# Patient Record
Sex: Female | Born: 1935 | Race: White | Hispanic: No | Smoking: Former smoker
Health system: Southern US, Community
[De-identification: ages and names within clinical notes are randomized; demographics above are authoritative.]

## PROBLEM LIST (undated history)

## (undated) DIAGNOSIS — C50919 Malignant neoplasm of unspecified site of unspecified female breast: Secondary | ICD-10-CM

## (undated) DIAGNOSIS — S72141A Displaced intertrochanteric fracture of right femur, initial encounter for closed fracture: Secondary | ICD-10-CM

## (undated) DIAGNOSIS — I10 Essential (primary) hypertension: Secondary | ICD-10-CM

## (undated) DIAGNOSIS — I519 Heart disease, unspecified: Secondary | ICD-10-CM

## (undated) DIAGNOSIS — H905 Unspecified sensorineural hearing loss: Secondary | ICD-10-CM

## (undated) DIAGNOSIS — M48061 Spinal stenosis, lumbar region without neurogenic claudication: Secondary | ICD-10-CM

## (undated) DIAGNOSIS — E559 Vitamin D deficiency, unspecified: Secondary | ICD-10-CM

## (undated) DIAGNOSIS — E039 Hypothyroidism, unspecified: Secondary | ICD-10-CM

## (undated) DIAGNOSIS — N390 Urinary tract infection, site not specified: Secondary | ICD-10-CM

## (undated) DIAGNOSIS — N183 Chronic kidney disease, stage 3 unspecified: Secondary | ICD-10-CM

## (undated) DIAGNOSIS — Z87891 Personal history of nicotine dependence: Secondary | ICD-10-CM

## (undated) DIAGNOSIS — H3530 Unspecified macular degeneration: Secondary | ICD-10-CM

## (undated) DIAGNOSIS — E785 Hyperlipidemia, unspecified: Secondary | ICD-10-CM

## (undated) DIAGNOSIS — N2581 Secondary hyperparathyroidism of renal origin: Secondary | ICD-10-CM

## (undated) DIAGNOSIS — E871 Hypo-osmolality and hyponatremia: Secondary | ICD-10-CM

## (undated) HISTORY — DX: Urinary tract infection, site not specified: N39.0

## (undated) HISTORY — DX: Secondary hyperparathyroidism of renal origin: N25.81

## (undated) HISTORY — DX: Hypothyroidism, unspecified: E03.9

## (undated) HISTORY — DX: Displaced intertrochanteric fracture of right femur, initial encounter for closed fracture: S72.141A

## (undated) HISTORY — DX: Hyperlipidemia, unspecified: E78.5

## (undated) HISTORY — DX: Malignant neoplasm of unspecified site of unspecified female breast: C50.919

## (undated) HISTORY — DX: Chronic kidney disease, stage 3 (moderate): N18.3

## (undated) HISTORY — DX: Chronic kidney disease, stage 3 unspecified: N18.30

## (undated) HISTORY — PX: REPLACEMENT TOTAL KNEE BILATERAL: SUR1225

## (undated) HISTORY — DX: Heart disease, unspecified: I51.9

## (undated) HISTORY — PX: ABDOMINAL HYSTERECTOMY: SHX81

## (undated) HISTORY — DX: Essential (primary) hypertension: I10

## (undated) HISTORY — PX: OTHER SURGICAL HISTORY: SHX169

## (undated) HISTORY — DX: Hypo-osmolality and hyponatremia: E87.1

## (undated) HISTORY — DX: Spinal stenosis, lumbar region without neurogenic claudication: M48.061

## (undated) HISTORY — DX: Vitamin D deficiency, unspecified: E55.9

## (undated) HISTORY — PX: JOINT REPLACEMENT: SHX530

## (undated) HISTORY — DX: Unspecified macular degeneration: H35.30

## (undated) DEATH — deceased

---

## 1978-12-04 HISTORY — PX: CHOLECYSTECTOMY: SHX55

## 2003-01-14 DIAGNOSIS — M1710 Unilateral primary osteoarthritis, unspecified knee: Secondary | ICD-10-CM | POA: Insufficient documentation

## 2004-12-04 DIAGNOSIS — M48061 Spinal stenosis, lumbar region without neurogenic claudication: Secondary | ICD-10-CM

## 2004-12-04 HISTORY — PX: SPINE SURGERY: SHX786

## 2004-12-04 HISTORY — DX: Spinal stenosis, lumbar region without neurogenic claudication: M48.061

## 2008-06-16 ENCOUNTER — Ambulatory Visit: Payer: Self-pay | Admitting: Internal Medicine

## 2008-12-04 HISTORY — PX: SMALL INTESTINE SURGERY: SHX150

## 2009-01-06 ENCOUNTER — Ambulatory Visit: Payer: Self-pay | Admitting: Internal Medicine

## 2009-03-17 LAB — HM DEXA SCAN: HM Dexa Scan: NORMAL

## 2009-03-17 LAB — HM MAMMOGRAPHY: HM Mammogram: NORMAL

## 2009-05-16 ENCOUNTER — Inpatient Hospital Stay: Payer: Self-pay | Admitting: Surgery

## 2009-05-18 LAB — HM SIGMOIDOSCOPY

## 2009-06-04 LAB — CBC AND DIFFERENTIAL
HCT: 35 % — AB (ref 36–46)
Hemoglobin: 11.7 g/dL — AB (ref 12.0–16.0)
Neutrophils Absolute: 48 /uL
Platelets: 401 K/µL — AB (ref 150–399)
WBC: 6.1 10*3/mL

## 2009-06-04 LAB — HEPATIC FUNCTION PANEL
ALT: 17 U/L (ref 7–35)
AST: 26 U/L (ref 13–35)
Alkaline Phosphatase: 58 U/L (ref 25–125)
Bilirubin, Total: 0.8 mg/dL

## 2009-06-04 LAB — BASIC METABOLIC PANEL WITH GFR
BUN: 24 mg/dL — AB (ref 4–21)
Creatinine: 1.2 mg/dL — AB (ref 0.5–1.1)
Glucose: 128 mg/dL
Potassium: 4.8 mmol/L (ref 3.4–5.3)

## 2009-06-23 ENCOUNTER — Inpatient Hospital Stay: Payer: Self-pay | Admitting: Surgery

## 2009-08-03 ENCOUNTER — Ambulatory Visit: Payer: Self-pay | Admitting: Internal Medicine

## 2009-12-04 HISTORY — PX: LAPAROSCOPIC SIGMOID COLECTOMY: SHX5928

## 2011-03-21 DIAGNOSIS — H268 Other specified cataract: Secondary | ICD-10-CM | POA: Insufficient documentation

## 2011-03-21 DIAGNOSIS — H3530 Unspecified macular degeneration: Secondary | ICD-10-CM | POA: Insufficient documentation

## 2011-03-21 DIAGNOSIS — H35369 Drusen (degenerative) of macula, unspecified eye: Secondary | ICD-10-CM | POA: Insufficient documentation

## 2011-07-25 ENCOUNTER — Telehealth: Payer: Self-pay | Admitting: Internal Medicine

## 2011-07-25 MED ORDER — ENALAPRIL MALEATE 20 MG PO TABS: 20.0000 mg | ORAL_TABLET | Freq: Every day | ORAL | Status: DC

## 2011-07-25 MED ORDER — LEVOTHYROXINE SODIUM 50 MCG PO TABS: 50.0000 ug | ORAL_TABLET | Freq: Every day | ORAL | Status: DC

## 2011-07-25 NOTE — Telephone Encounter (Signed)
Patient requested Levoxyl 50 mcg and Enalapril 20 mg.  I have faxed both Rxs into [employer].

## 2011-08-14 ENCOUNTER — Other Ambulatory Visit: Payer: Self-pay | Admitting: *Deleted

## 2011-08-14 NOTE — Telephone Encounter (Signed)
Opened in error. Medication already refilled.

## 2011-12-06 ENCOUNTER — Telehealth: Payer: Self-pay | Admitting: Internal Medicine

## 2011-12-06 NOTE — Telephone Encounter (Signed)
Pt called stating she needed a lab order send to [facility] @ [facility] so she can get her labs done before her appointment on 12/15/11 Pt didn't have fax number

## 2011-12-06 NOTE — Telephone Encounter (Signed)
On your printer

## 2011-12-07 NOTE — Telephone Encounter (Signed)
Letter faxed to [facility] at [phone].

## 2011-12-11 ENCOUNTER — Telehealth: Payer: Self-pay | Admitting: Internal Medicine

## 2011-12-11 DIAGNOSIS — E039 Hypothyroidism, unspecified: Secondary | ICD-10-CM | POA: Diagnosis not present

## 2011-12-11 DIAGNOSIS — E785 Hyperlipidemia, unspecified: Secondary | ICD-10-CM | POA: Diagnosis not present

## 2011-12-11 DIAGNOSIS — Z79899 Other long term (current) drug therapy: Secondary | ICD-10-CM | POA: Diagnosis not present

## 2011-12-15 ENCOUNTER — Ambulatory Visit (INDEPENDENT_AMBULATORY_CARE_PROVIDER_SITE_OTHER): Payer: Medicare Other | Admitting: Internal Medicine

## 2011-12-15 ENCOUNTER — Encounter: Payer: Self-pay | Admitting: Internal Medicine

## 2011-12-15 VITALS — BP 120/64 | HR 76 | Temp 97.9°F | Wt 142.0 lb

## 2011-12-15 DIAGNOSIS — M48061 Spinal stenosis, lumbar region without neurogenic claudication: Secondary | ICD-10-CM | POA: Diagnosis not present

## 2011-12-15 DIAGNOSIS — I10 Essential (primary) hypertension: Secondary | ICD-10-CM | POA: Diagnosis not present

## 2011-12-15 DIAGNOSIS — E039 Hypothyroidism, unspecified: Secondary | ICD-10-CM | POA: Diagnosis not present

## 2011-12-15 DIAGNOSIS — E871 Hypo-osmolality and hyponatremia: Secondary | ICD-10-CM | POA: Diagnosis not present

## 2011-12-15 NOTE — Progress Notes (Signed)
  Subjective:    Patient ID: Pamela Paul, female    DOB: Nov 06, 1926, 78 y.o.   MRN: [id number]  HPI  Ms. Zobrist is an  78 yo white female with a history of prior lumbar surgery who presents for 6 month follow up on hypertension and hypothyroidism.   Her chief complaint is  with occasional dead/heavy feeling in both legs occurring without  pain , just occasional loss of power in legs.  She has no falls and denies a feeling of numbness.   She occasionally notes transient pain in the anterior shin of her right legs which aches for a few hours before resolving spontaneously .   She was also recently treated with avelox empirically by [facility] her husband's ENT for incident complaint of dysuria. Symptoms are resolved.   Past Medical History  Diagnosis Date  . Spinal stenosis of lumbar region     s/p surgery  . Hypothyroidism   . Hypertension     Current Outpatient Prescriptions on File Prior to Visit  Medication Sig Dispense Refill  . enalapril (VASOTEC) 20 MG tablet Take 1 tablet (20 mg total) by mouth daily.  90 tablet  3  . levothyroxine (SYNTHROID, LEVOTHROID) 50 MCG tablet Take 1 tablet (50 mcg total) by mouth daily.  90 tablet  3     Review of Systems  Constitutional: Negative for fever, chills, appetite change, fatigue and unexpected weight change.  HENT: Negative for ear pain, congestion, sore throat, trouble swallowing, neck pain, voice change and sinus pressure.   Eyes: Negative for visual disturbance.  Respiratory: Negative for cough, shortness of breath, wheezing and stridor.   Cardiovascular: Negative for chest pain, palpitations and leg swelling.  Gastrointestinal: Negative for nausea, vomiting, abdominal pain, diarrhea, constipation, blood in stool, abdominal distention and anal bleeding.  Genitourinary: Negative for dysuria and flank pain.  Musculoskeletal: Negative for myalgias, arthralgias and gait problem.  Skin: Negative for color change and rash.  Neurological:  Positive for weakness. Negative for dizziness and headaches.  Hematological: Negative for adenopathy. Does not bruise/bleed easily.  Psychiatric/Behavioral: Negative for suicidal ideas, sleep disturbance and dysphoric mood. The patient is not nervous/anxious.   BP 120/64  Pulse 76  Temp(Src) 97.9 F (36.6 C) (Oral)  Wt 142 lb (64.411 kg)  SpO2 98%      Objective:   Physical Exam  Constitutional: She is oriented to person, place, and time. She appears well-developed and well-nourished.  HENT:  Mouth/Throat: Oropharynx is clear and moist.  Eyes: EOM are normal. Pupils are equal, round, and reactive to light. No scleral icterus.  Neck: Normal range of motion. Neck supple. No JVD present. No thyromegaly present.  Cardiovascular: Normal rate, regular rhythm, normal heart sounds and intact distal pulses.   Pulmonary/Chest: Effort normal and breath sounds normal.  Abdominal: Soft. Bowel sounds are normal. She exhibits no mass. There is no tenderness.  Genitourinary: Uterus normal.  Musculoskeletal: Normal range of motion. She exhibits no edema.  Lymphadenopathy:    She has no cervical adenopathy.  Neurological: She is alert and oriented to person, place, and time.  Skin: Skin is warm and dry.  Psychiatric: She has a normal mood and affect.       Assessment & Plan:    Updated Medication List

## 2011-12-15 NOTE — Patient Instructions (Signed)
Please ask Leafy Ro to draw a BMET  In on e month to assess resolution of  hyponatremia  (276.8) nonfasting

## 2011-12-17 ENCOUNTER — Encounter: Payer: Self-pay | Admitting: Internal Medicine

## 2011-12-17 DIAGNOSIS — I10 Essential (primary) hypertension: Secondary | ICD-10-CM | POA: Insufficient documentation

## 2011-12-17 DIAGNOSIS — E039 Hypothyroidism, unspecified: Secondary | ICD-10-CM | POA: Insufficient documentation

## 2011-12-17 DIAGNOSIS — E871 Hypo-osmolality and hyponatremia: Secondary | ICD-10-CM | POA: Insufficient documentation

## 2011-12-17 DIAGNOSIS — M48061 Spinal stenosis, lumbar region without neurogenic claudication: Secondary | ICD-10-CM | POA: Insufficient documentation

## 2011-12-17 NOTE — Assessment & Plan Note (Signed)
Well controlled on current regimen. Renal function stable, no changes today. 

## 2011-12-17 NOTE — Assessment & Plan Note (Signed)
tsh was 2.7 recently .  continue current Synthroid dose.

## 2011-12-17 NOTE — Assessment & Plan Note (Signed)
With recent development of intermittent symptps suggestng neurogenic claudication.  DTRs are normal, as is her strength and feeling today.  Will follow for now.

## 2011-12-17 NOTE — Assessment & Plan Note (Addendum)
Mild, with elevated Cr of 1.4 ,  May be due to mild dehydration, not taking a diuretic.  Will repeat in na few weeks in a nonfasting state.

## 2011-12-22 NOTE — Telephone Encounter (Signed)
Error

## 2011-12-29 DIAGNOSIS — M4310 Spondylolisthesis, site unspecified: Secondary | ICD-10-CM | POA: Diagnosis not present

## 2012-01-02 DIAGNOSIS — H35369 Drusen (degenerative) of macula, unspecified eye: Secondary | ICD-10-CM | POA: Diagnosis not present

## 2012-01-02 DIAGNOSIS — H3530 Unspecified macular degeneration: Secondary | ICD-10-CM | POA: Diagnosis not present

## 2012-01-02 DIAGNOSIS — H35729 Serous detachment of retinal pigment epithelium, unspecified eye: Secondary | ICD-10-CM | POA: Diagnosis not present

## 2012-01-03 DIAGNOSIS — M6281 Muscle weakness (generalized): Secondary | ICD-10-CM | POA: Diagnosis not present

## 2012-01-03 DIAGNOSIS — R262 Difficulty in walking, not elsewhere classified: Secondary | ICD-10-CM | POA: Diagnosis not present

## 2012-01-05 DIAGNOSIS — R262 Difficulty in walking, not elsewhere classified: Secondary | ICD-10-CM | POA: Diagnosis not present

## 2012-01-05 DIAGNOSIS — M6281 Muscle weakness (generalized): Secondary | ICD-10-CM | POA: Diagnosis not present

## 2012-01-08 DIAGNOSIS — R262 Difficulty in walking, not elsewhere classified: Secondary | ICD-10-CM | POA: Diagnosis not present

## 2012-01-08 DIAGNOSIS — M6281 Muscle weakness (generalized): Secondary | ICD-10-CM | POA: Diagnosis not present

## 2012-01-10 DIAGNOSIS — M6281 Muscle weakness (generalized): Secondary | ICD-10-CM | POA: Diagnosis not present

## 2012-01-10 DIAGNOSIS — R262 Difficulty in walking, not elsewhere classified: Secondary | ICD-10-CM | POA: Diagnosis not present

## 2012-01-12 DIAGNOSIS — R262 Difficulty in walking, not elsewhere classified: Secondary | ICD-10-CM | POA: Diagnosis not present

## 2012-01-12 DIAGNOSIS — M6281 Muscle weakness (generalized): Secondary | ICD-10-CM | POA: Diagnosis not present

## 2012-01-15 DIAGNOSIS — R262 Difficulty in walking, not elsewhere classified: Secondary | ICD-10-CM | POA: Diagnosis not present

## 2012-01-15 DIAGNOSIS — M6281 Muscle weakness (generalized): Secondary | ICD-10-CM | POA: Diagnosis not present

## 2012-01-16 DIAGNOSIS — M6281 Muscle weakness (generalized): Secondary | ICD-10-CM | POA: Diagnosis not present

## 2012-01-16 DIAGNOSIS — R262 Difficulty in walking, not elsewhere classified: Secondary | ICD-10-CM | POA: Diagnosis not present

## 2012-01-18 DIAGNOSIS — E876 Hypokalemia: Secondary | ICD-10-CM | POA: Diagnosis not present

## 2012-01-19 DIAGNOSIS — M6281 Muscle weakness (generalized): Secondary | ICD-10-CM | POA: Diagnosis not present

## 2012-01-19 DIAGNOSIS — R262 Difficulty in walking, not elsewhere classified: Secondary | ICD-10-CM | POA: Diagnosis not present

## 2012-01-22 DIAGNOSIS — M6281 Muscle weakness (generalized): Secondary | ICD-10-CM | POA: Diagnosis not present

## 2012-01-22 DIAGNOSIS — R262 Difficulty in walking, not elsewhere classified: Secondary | ICD-10-CM | POA: Diagnosis not present

## 2012-01-23 DIAGNOSIS — R262 Difficulty in walking, not elsewhere classified: Secondary | ICD-10-CM | POA: Diagnosis not present

## 2012-01-23 DIAGNOSIS — M6281 Muscle weakness (generalized): Secondary | ICD-10-CM | POA: Diagnosis not present

## 2012-01-26 DIAGNOSIS — M6281 Muscle weakness (generalized): Secondary | ICD-10-CM | POA: Diagnosis not present

## 2012-01-26 DIAGNOSIS — R262 Difficulty in walking, not elsewhere classified: Secondary | ICD-10-CM | POA: Diagnosis not present

## 2012-01-29 ENCOUNTER — Ambulatory Visit (INDEPENDENT_AMBULATORY_CARE_PROVIDER_SITE_OTHER): Payer: Medicare Other | Admitting: Internal Medicine

## 2012-01-29 ENCOUNTER — Encounter: Payer: Self-pay | Admitting: Internal Medicine

## 2012-01-29 DIAGNOSIS — J329 Chronic sinusitis, unspecified: Secondary | ICD-10-CM | POA: Diagnosis not present

## 2012-01-29 MED ORDER — BENZONATATE 200 MG PO CAPS: 200.0000 mg | ORAL_CAPSULE | Freq: Three times a day (TID) | ORAL | Status: AC | PRN

## 2012-01-29 MED ORDER — AZITHROMYCIN 500 MG PO TABS: 500.0000 mg | ORAL_TABLET | Freq: Every day | ORAL | Status: DC

## 2012-01-29 NOTE — Patient Instructions (Signed)
You have an upper respiratory infection that appears to be viral.  It will run its course in 7 to 10 days .   Use Delsym OTC for cough.  I will give you a stronger cough medicine to use if the delsym is not strong enough for the cough.   Use Simply saline twice daily to flush your sinuses , use for the rest of the winter.      I will  prescribe an antibiotic that you can start on Wednesday or Thursday if you have not made any improvement  By then

## 2012-01-29 NOTE — Assessment & Plan Note (Signed)
Given her age and symptoms suggestive of viral URI, will treat symptomatically and add  empiric antibiotics, if decongestants, and saline lavage do not resolve symptoms.

## 2012-01-29 NOTE — Progress Notes (Signed)
Subjective:    Patient ID: Pamela Paul, female    DOB: Mar 17, 1926, 78 y.o.   MRN: [id number]  HPI  78 yr old white female presents with cough which started  3 days ago,  Nose running started today,  No fevers or myalgias .  Cough is mildly productive.  Leaving for [place] on Satruday and wants to be well enough to travel.   Past Medical History  Diagnosis Date  . Spinal stenosis of lumbar region     s/p surgery  . Hypothyroidism   . Hypertension    Current Outpatient Prescriptions on File Prior to Visit  Medication Sig Dispense Refill  . aspirin 81 MG tablet Take 160 mg by mouth daily.      . Calcium-Vitamin D (CALTRATE 600 PLUS-VIT D PO) Take by mouth daily      . enalapril (VASOTEC) 20 MG tablet Take 1 tablet (20 mg total) by mouth daily.  90 tablet  3  . levothyroxine (SYNTHROID, LEVOTHROID) 50 MCG tablet Take 1 tablet (50 mcg total) by mouth daily.  90 tablet  3    Review of Systems  Constitutional: Negative for fever, chills and unexpected weight change.  HENT: Positive for postnasal drip. Negative for hearing loss, ear pain, nosebleeds, congestion, sore throat, facial swelling, rhinorrhea, sneezing, mouth sores, trouble swallowing, neck pain, neck stiffness, voice change, sinus pressure, tinnitus and ear discharge.   Eyes: Negative for pain, discharge, redness and visual disturbance.  Respiratory: Positive for cough and wheezing. Negative for chest tightness, shortness of breath and stridor.   Cardiovascular: Negative for chest pain, palpitations and leg swelling.  Musculoskeletal: Negative for myalgias and arthralgias.  Skin: Negative for color change and rash.  Neurological: Negative for dizziness, weakness, light-headedness and headaches.  Hematological: Negative for adenopathy.       Objective:   Physical Exam  Constitutional: She is oriented to person, place, and time. She appears well-developed and well-nourished.  HENT:  Mouth/Throat: Oropharynx is clear and  moist.  Eyes: EOM are normal. Pupils are equal, round, and reactive to light. No scleral icterus.  Neck: Normal range of motion. Neck supple. No JVD present. No thyromegaly present.  Cardiovascular: Normal rate, regular rhythm, normal heart sounds and intact distal pulses.   Pulmonary/Chest: Effort normal and breath sounds normal.  Abdominal: Soft. Bowel sounds are normal. She exhibits no mass. There is no tenderness.  Musculoskeletal: Normal range of motion. She exhibits no edema.  Lymphadenopathy:    She has no cervical adenopathy.  Neurological: She is alert and oriented to person, place, and time.  Skin: Skin is warm and dry.  Psychiatric: She has a normal mood and affect.      Assessment & Plan:   Sinusitis Given her age and symptoms suggestive of viral URI, will treat symptomatically and add  empiric antibiotics, if decongestants, and saline lavage do not resolve symptoms.     Updated Medication List Outpatient Encounter Prescriptions as of 01/29/2012  Medication Sig Dispense Refill  . aspirin 81 MG tablet Take 160 mg by mouth daily.      . Calcium-Vitamin D (CALTRATE 600 PLUS-VIT D PO) Take by mouth daily      . enalapril (VASOTEC) 20 MG tablet Take 1 tablet (20 mg total) by mouth daily.  90 tablet  3  . levothyroxine (SYNTHROID, LEVOTHROID) 50 MCG tablet Take 1 tablet (50 mcg total) by mouth daily.  90 tablet  3  . Multiple Vitamins-Minerals (PRESERVISION AREDS PO) Take by mouth.      Marland Kitchen  azithromycin (ZITHROMAX) 500 MG tablet Take 1 tablet (500 mg total) by mouth daily.  6 tablet  0  . benzonatate (TESSALON) 200 MG capsule Take 1 capsule (200 mg total) by mouth 3 (three) times daily as needed for cough.  60 capsule  0

## 2012-02-06 ENCOUNTER — Encounter: Payer: Self-pay | Admitting: Internal Medicine

## 2012-02-14 ENCOUNTER — Encounter: Payer: Self-pay | Admitting: Internal Medicine

## 2012-02-14 DIAGNOSIS — M4310 Spondylolisthesis, site unspecified: Secondary | ICD-10-CM | POA: Diagnosis not present

## 2012-02-14 DIAGNOSIS — Z981 Arthrodesis status: Secondary | ICD-10-CM | POA: Diagnosis not present

## 2012-02-14 DIAGNOSIS — I519 Heart disease, unspecified: Secondary | ICD-10-CM | POA: Insufficient documentation

## 2012-02-14 DIAGNOSIS — E785 Hyperlipidemia, unspecified: Secondary | ICD-10-CM | POA: Insufficient documentation

## 2012-02-14 DIAGNOSIS — Q762 Congenital spondylolisthesis: Secondary | ICD-10-CM | POA: Diagnosis not present

## 2012-03-15 ENCOUNTER — Ambulatory Visit (INDEPENDENT_AMBULATORY_CARE_PROVIDER_SITE_OTHER): Payer: Medicare Other | Admitting: Internal Medicine

## 2012-03-15 ENCOUNTER — Encounter: Payer: Self-pay | Admitting: Internal Medicine

## 2012-03-15 VITALS — BP 126/60 | HR 70 | Temp 97.9°F | Resp 14 | Ht 65.5 in | Wt 184.8 lb

## 2012-03-15 DIAGNOSIS — Z0000 Encounter for general adult medical examination without abnormal findings: Secondary | ICD-10-CM

## 2012-03-15 DIAGNOSIS — Z1239 Encounter for other screening for malignant neoplasm of breast: Secondary | ICD-10-CM | POA: Diagnosis not present

## 2012-03-15 DIAGNOSIS — Z1211 Encounter for screening for malignant neoplasm of colon: Secondary | ICD-10-CM | POA: Insufficient documentation

## 2012-03-15 NOTE — Assessment & Plan Note (Addendum)
She was admitted with SBO at [facility]  In June 2010, had a flexible sigmoidoscopy at that time which was negative for masses,  Only diverticulosis. She does not want to continue screening except annual fecal occult  blood tests.

## 2012-03-17 DIAGNOSIS — Z1239 Encounter for other screening for malignant neoplasm of breast: Secondary | ICD-10-CM | POA: Insufficient documentation

## 2012-03-17 NOTE — Progress Notes (Signed)
Patient ID: Pamela Paul, female   DOB: June 19, 1926, 78 y.o.   MRN: [id number] The patient is here for annual Medicare wellness examination and management of other chronic and acute problems.   The risk factors are reflected in the social history.  The roster of all physicians providing medical care to patient - is listed in the Snapshot section of the chart.  Activities of daily living:  The patient is 100% independent in all ADLs: dressing, toileting, feeding as well as independent mobility  Home safety : The patient has smoke detectors in the home. They wear seatbelts.  There are no firearms at home. There is no violence in the home.   There is no risks for hepatitis, STDs or HIV. There is no   history of blood transfusion. They have no travel history to infectious disease endemic areas of the world.  The patient has seen their dentist in the last six month. They have seen their eye doctor in the last year. They admit to slight hearing difficulty with regard to whispered voices and some television programs.  They have deferred audiologic testing in the last year.  They do not  have excessive sun exposure. Discussed the need for sun protection: hats, long sleeves and use of sunscreen if there is significant sun exposure.   Diet: the importance of a healthy diet is discussed. They do have a healthy diet.  The benefits of regular aerobic exercise were discussed. She walks and swims 4 times per week ,  20 minutes.   Depression screen: there are no signs or vegative symptoms of depression- irritability, change in appetite, anhedonia, sadness/tearfullness.  Cognitive assessment: the patient manages all their financial and personal affairs and is actively engaged. They could relate day,date,year and events; recalled 1/3 objects at 3 minutes; performed clock-face test normally.  The following portions of the patient's history were reviewed and updated as appropriate: allergies, current medications,  past family history, past medical history,  past surgical history, past social history  and problem list.  Visual acuity was not assessed per patient preference since she has regular follow up with her ophthalmologist. Hearing and body mass index were assessed and reviewed.   During the course of the visit the patient was educated and counseled about appropriate screening and preventive services including : fall prevention , diabetes screening, nutrition counseling, colorectal cancer screening, and recommended immunizations.   Past Medical History  Diagnosis Date  . Spinal stenosis of lumbar region     s/p surgery  . Hypertension   . Hyperlipidemia   . Hypothyroidism   . Heart disease     mitral valvular   Past Surgical History  Procedure Date  . Tendon repair     achille heall, left   . Spine surgery 2006    rods, [facility]  . Colectomy. Partial left 2010    for SBO  . Cholecystectomy 1980  . Joint replacement     Bilateral knee  . Abdominal hysterectomy   . Bilateral cataract surgery   . Replacement total knee bilateral     BP 126/60  Pulse 70  Temp(Src) 97.9 F (36.6 C) (Oral)  Resp 14  Ht 5' 5.5" (1.664 m)  Wt 184 lb 12 oz (83.802 kg)  BMI 30.28 kg/m2  SpO2 95%  General appearance: alert, cooperative and appears stated age Ears: normal TM's and external ear canals both ears Throat: lips, mucosa, and tongue normal; teeth and gums normal Neck: no adenopathy, no carotid bruit, supple,  symmetrical, trachea midline and thyroid not enlarged, symmetric, no tenderness/mass/nodules Back: symmetric, no curvature. ROM normal. No CVA tenderness. Lungs: clear to auscultation bilaterally Heart: regular rate and rhythm, S1, S2 normal, no murmur, click, rub or gallop Abdomen: soft, non-tender; bowel sounds normal; no masses,  no organomegaly Pulses: 2+ and symmetric Skin: Skin color, texture, turgor normal. No rashes or lesions Lymph nodes: Cervical, supraclavicular, and axillary  nodes normal.

## 2012-03-17 NOTE — Assessment & Plan Note (Signed)
After discussing the the benefits and risks with continued screening for breast cancer she has decided to resume annual mammograms.

## 2012-03-26 DIAGNOSIS — Z961 Presence of intraocular lens: Secondary | ICD-10-CM | POA: Diagnosis not present

## 2012-03-26 DIAGNOSIS — H3530 Unspecified macular degeneration: Secondary | ICD-10-CM | POA: Diagnosis not present

## 2012-04-17 ENCOUNTER — Telehealth: Payer: Self-pay | Admitting: Internal Medicine

## 2012-04-17 ENCOUNTER — Encounter: Payer: Self-pay | Admitting: Internal Medicine

## 2012-04-17 ENCOUNTER — Ambulatory Visit (INDEPENDENT_AMBULATORY_CARE_PROVIDER_SITE_OTHER): Payer: Medicare Other | Admitting: Internal Medicine

## 2012-04-17 VITALS — BP 123/72 | HR 73 | Temp 98.1°F | Resp 16 | Wt 188.5 lb

## 2012-04-17 DIAGNOSIS — J069 Acute upper respiratory infection, unspecified: Secondary | ICD-10-CM

## 2012-04-17 DIAGNOSIS — R05 Cough: Secondary | ICD-10-CM

## 2012-04-17 DIAGNOSIS — R059 Cough, unspecified: Secondary | ICD-10-CM | POA: Diagnosis not present

## 2012-04-17 MED ORDER — BENZONATATE 200 MG PO CAPS: 200.0000 mg | ORAL_CAPSULE | Freq: Two times a day (BID) | ORAL | Status: AC | PRN

## 2012-04-17 MED ORDER — AZITHROMYCIN 250 MG PO TABS: ORAL_TABLET | ORAL | Status: AC

## 2012-04-17 NOTE — Telephone Encounter (Signed)
Caller: Sanyah/Patient; PCP: Deborra Medina; CB#: [phone];  Call regarding Cough/Congestion; sx started 04/12/12; no fever; non productive cough; cough is the worst sx; Triaged per Cough Guideline; See in 24 hr d/t recurrent  episodes of uncontrolled coughing and interfering with daily activities; appt made for today at 4:15pm Dr Gilford Rile; will comply

## 2012-04-17 NOTE — Assessment & Plan Note (Signed)
Symptoms and exam are most consistent with viral upper respiratory infection with cough. Encouraged her to continue rest and increase fluid intake. She will use Tylenol as needed for sore throat. She will use Tessalon for cough. If her symptoms persist over the next 48 hours or she develops fever, chills, worsening cough, then she will start antibiotics with azithromycin. If her symptoms are worsening or not improving, she will call our office with an update.

## 2012-04-17 NOTE — Progress Notes (Signed)
  Subjective:    Patient ID: Pamela Paul, female    DOB: [birth year], 78 y.o.   MRN: [id number]  HPI 78 year old female with history of hypertension and hypothyroidism presents with a four-day history of fatigue, sore throat, and nonproductive cough. She denies fever or chills. She denies chest pain. She denies any known sick contacts. She has not been taking any medication for her symptoms. She has tried increasing her fluid intake and sipping on hot tea with some improvement in her sore throat.  Outpatient Prescriptions Prior to Visit  Medication Sig Dispense Refill  . aspirin 81 MG tablet Take 160 mg by mouth daily.      . Calcium-Vitamin D (CALTRATE 600 PLUS-VIT D PO) Take by mouth daily      . enalapril (VASOTEC) 20 MG tablet Take 1 tablet (20 mg total) by mouth daily.  90 tablet  3  . levothyroxine (SYNTHROID, LEVOTHROID) 50 MCG tablet Take 1 tablet (50 mcg total) by mouth daily.  90 tablet  3  . Multiple Vitamins-Minerals (PRESERVISION AREDS PO) Take by mouth.        Review of Systems  Constitutional: Positive for fatigue. Negative for fever, chills and unexpected weight change.  HENT: Positive for congestion, sore throat and rhinorrhea. Negative for hearing loss, ear pain, nosebleeds, facial swelling, sneezing, mouth sores, trouble swallowing, neck pain, neck stiffness, voice change, postnasal drip, sinus pressure, tinnitus and ear discharge.   Eyes: Negative for pain, discharge, redness and visual disturbance.  Respiratory: Positive for cough. Negative for chest tightness, shortness of breath, wheezing and stridor.   Cardiovascular: Negative for chest pain, palpitations and leg swelling.  Musculoskeletal: Negative for myalgias and arthralgias.  Skin: Negative for color change and rash.  Neurological: Negative for dizziness, weakness, light-headedness and headaches.  Hematological: Negative for adenopathy.   BP 123/72  Pulse 73  Temp(Src) 98.1 F (36.7 C) (Oral)  Resp 16  Wt  188 lb 8 oz (85.503 kg)  SpO2 96%     Objective:   Physical Exam  Constitutional: She is oriented to person, place, and time. She appears well-developed and well-nourished. No distress.  HENT:  Head: Normocephalic and atraumatic.  Right Ear: External ear normal.  Left Ear: External ear normal.  Nose: Nose normal.  Mouth/Throat: Posterior oropharyngeal erythema present. No oropharyngeal exudate.  Eyes: Conjunctivae are normal. Pupils are equal, round, and reactive to light. Right eye exhibits no discharge. Left eye exhibits no discharge. No scleral icterus.  Neck: Normal range of motion. Neck supple. No tracheal deviation present. No thyromegaly present.  Cardiovascular: Normal rate, regular rhythm, normal heart sounds and intact distal pulses.  Exam reveals no gallop and no friction rub.   No murmur heard. Pulmonary/Chest: Effort normal and breath sounds normal. No respiratory distress. She has no wheezes. She has no rales. She exhibits no tenderness.  Musculoskeletal: Normal range of motion. She exhibits no edema and no tenderness.  Lymphadenopathy:    She has no cervical adenopathy.  Neurological: She is alert and oriented to person, place, and time. No cranial nerve deficit. She exhibits normal muscle tone. Coordination normal.  Skin: Skin is warm and dry. No rash noted. She is not diaphoretic. No erythema. No pallor.  Psychiatric: She has a normal mood and affect. Her behavior is normal. Judgment and thought content normal.          Assessment & Plan:

## 2012-05-22 DIAGNOSIS — L609 Nail disorder, unspecified: Secondary | ICD-10-CM | POA: Diagnosis not present

## 2012-05-22 DIAGNOSIS — L570 Actinic keratosis: Secondary | ICD-10-CM | POA: Diagnosis not present

## 2012-05-24 ENCOUNTER — Ambulatory Visit: Payer: Self-pay | Admitting: Internal Medicine

## 2012-05-24 DIAGNOSIS — Z1231 Encounter for screening mammogram for malignant neoplasm of breast: Secondary | ICD-10-CM | POA: Diagnosis not present

## 2012-06-03 ENCOUNTER — Encounter: Payer: Self-pay | Admitting: Internal Medicine

## 2012-06-16 LAB — HM MAMMOGRAPHY: HM Mammogram: NORMAL

## 2012-07-16 DIAGNOSIS — H353190 Nonexudative age-related macular degeneration, unspecified eye, stage unspecified: Secondary | ICD-10-CM | POA: Diagnosis not present

## 2012-07-16 DIAGNOSIS — H35369 Drusen (degenerative) of macula, unspecified eye: Secondary | ICD-10-CM | POA: Diagnosis not present

## 2012-07-16 DIAGNOSIS — H43819 Vitreous degeneration, unspecified eye: Secondary | ICD-10-CM | POA: Diagnosis not present

## 2012-07-16 DIAGNOSIS — Z961 Presence of intraocular lens: Secondary | ICD-10-CM | POA: Diagnosis not present

## 2012-07-19 ENCOUNTER — Telehealth: Payer: Self-pay | Admitting: Internal Medicine

## 2012-07-19 MED ORDER — ENALAPRIL MALEATE 20 MG PO TABS: 20.0000 mg | ORAL_TABLET | Freq: Every day | ORAL | Status: DC

## 2012-07-19 MED ORDER — LEVOTHYROXINE SODIUM 50 MCG PO TABS: 50.0000 ug | ORAL_TABLET | Freq: Every day | ORAL | Status: DC

## 2012-07-19 NOTE — Telephone Encounter (Signed)
Refill on enalapril 20 mg and levothyroxine 50 mcg.

## 2012-09-09 ENCOUNTER — Encounter: Payer: Self-pay | Admitting: Internal Medicine

## 2012-10-01 DIAGNOSIS — Z23 Encounter for immunization: Secondary | ICD-10-CM | POA: Diagnosis not present

## 2013-01-07 DIAGNOSIS — H3530 Unspecified macular degeneration: Secondary | ICD-10-CM | POA: Diagnosis not present

## 2013-01-07 DIAGNOSIS — Z961 Presence of intraocular lens: Secondary | ICD-10-CM | POA: Diagnosis not present

## 2013-01-07 DIAGNOSIS — H43819 Vitreous degeneration, unspecified eye: Secondary | ICD-10-CM | POA: Diagnosis not present

## 2013-01-18 ENCOUNTER — Other Ambulatory Visit: Payer: Self-pay

## 2013-02-20 ENCOUNTER — Telehealth: Payer: Self-pay | Admitting: General Practice

## 2013-02-20 NOTE — Telephone Encounter (Signed)
Can we please fax orders for pt to [facility] the nurse at [facility] before she comes in for her appt on 4/14. Pt wanting her bloodwork drawn before she comes in to see you.

## 2013-02-21 NOTE — Telephone Encounter (Signed)
Placed in the folder to be faxed.

## 2013-02-21 NOTE — Telephone Encounter (Signed)
should be on printer

## 2013-02-27 DIAGNOSIS — R5381 Other malaise: Secondary | ICD-10-CM | POA: Diagnosis not present

## 2013-02-27 DIAGNOSIS — E559 Vitamin D deficiency, unspecified: Secondary | ICD-10-CM | POA: Diagnosis not present

## 2013-02-27 DIAGNOSIS — E785 Hyperlipidemia, unspecified: Secondary | ICD-10-CM | POA: Diagnosis not present

## 2013-02-27 DIAGNOSIS — Z79899 Other long term (current) drug therapy: Secondary | ICD-10-CM | POA: Diagnosis not present

## 2013-03-03 ENCOUNTER — Telehealth: Payer: Self-pay | Admitting: Internal Medicine

## 2013-03-03 NOTE — Telephone Encounter (Signed)
Electrolytes are fine. She is Mildly anemic, and her kidney function is a little bit off. Cholesterol is fine except for triglycerides being elevated at 212. Vitamin D and thyroid are both fine. Before she comes from her apartment April 14 I would like to get a repeat kidney function tests and she is not fasting and a couple of iron studies on her anemia. I  Have drafted  a letter to the [facility] nurse that you can print and sent.

## 2013-03-03 NOTE — Telephone Encounter (Signed)
Pt.notified

## 2013-03-10 DIAGNOSIS — R5383 Other fatigue: Secondary | ICD-10-CM | POA: Diagnosis not present

## 2013-03-10 DIAGNOSIS — E559 Vitamin D deficiency, unspecified: Secondary | ICD-10-CM | POA: Diagnosis not present

## 2013-03-10 DIAGNOSIS — E785 Hyperlipidemia, unspecified: Secondary | ICD-10-CM | POA: Diagnosis not present

## 2013-03-10 DIAGNOSIS — R5381 Other malaise: Secondary | ICD-10-CM | POA: Diagnosis not present

## 2013-03-10 DIAGNOSIS — Z79899 Other long term (current) drug therapy: Secondary | ICD-10-CM | POA: Diagnosis not present

## 2013-03-12 ENCOUNTER — Telehealth: Payer: Self-pay | Admitting: Internal Medicine

## 2013-03-17 ENCOUNTER — Encounter: Payer: Self-pay | Admitting: Internal Medicine

## 2013-03-17 ENCOUNTER — Ambulatory Visit (INDEPENDENT_AMBULATORY_CARE_PROVIDER_SITE_OTHER): Payer: Medicare Other | Admitting: Internal Medicine

## 2013-03-17 VITALS — BP 138/72 | HR 63 | Temp 97.6°F | Resp 15 | Wt 189.2 lb

## 2013-03-17 DIAGNOSIS — Z1211 Encounter for screening for malignant neoplasm of colon: Secondary | ICD-10-CM

## 2013-03-17 DIAGNOSIS — Z0000 Encounter for general adult medical examination without abnormal findings: Secondary | ICD-10-CM | POA: Insufficient documentation

## 2013-03-17 DIAGNOSIS — Z1239 Encounter for other screening for malignant neoplasm of breast: Secondary | ICD-10-CM

## 2013-03-17 DIAGNOSIS — I10 Essential (primary) hypertension: Secondary | ICD-10-CM | POA: Diagnosis not present

## 2013-03-17 DIAGNOSIS — Z85038 Personal history of other malignant neoplasm of large intestine: Secondary | ICD-10-CM

## 2013-03-17 LAB — FECAL OCCULT BLOOD, GUAIAC: Fecal Occult Blood: NEGATIVE

## 2013-03-17 NOTE — Assessment & Plan Note (Addendum)
Pelvic, breast and rectal exam today .  fobt done.  Labs were done at [facility] prior to visit.,  BMEt was repeated nofasting bc of mild change in GFR

## 2013-03-17 NOTE — Progress Notes (Signed)
Patient ID: Pamela Paul, female   DOB: [birth year], 79 y.o.   MRN: [id number]  The patient is here for annual Medicare wellness examination and management of other chronic and acute problems.   The risk factors are reflected in the social history.  The roster of all physicians providing medical care to patient - is listed in the Snapshot section of the chart.  Activities of daily living:  The patient is 100% independent in all ADLs: dressing, toileting, feeding as well as independent mobility  Home safety : The patient has smoke detectors in the home. They wear seatbelts.  There are no firearms at home. There is no violence in the home.   There is no risks for hepatitis, STDs or HIV. There is no   history of blood transfusion. They have no travel history to infectious disease endemic areas of the world.  The patient has seen their dentist in the last six month. They have seen their eye doctor in the last year. They admit to slight hearing difficulty with regard to whispered voices and some television programs.  They have deferred audiologic testing in the last year.  They do not  have excessive sun exposure. Discussed the need for sun protection: hats, long sleeves and use of sunscreen if there is significant sun exposure.   Diet: the importance of a healthy diet is discussed. They do have a healthy diet.  The benefits of regular aerobic exercise were discussed. She walks 4 times per week ,  20 minutes.   Depression screen: there are no signs or vegative symptoms of depression- irritability, change in appetite, anhedonia, sadness/tearfullness.  Cognitive assessment: the patient manages all their financial and personal affairs and is actively engaged. They could relate day,date,year and events; recalled 2/3 objects at 3 minutes; performed clock-face test normally.  The following portions of the patient's history were reviewed and updated as appropriate: allergies, current medications, past  family history, past medical history,  past surgical history, past social history  and problem list.  Visual acuity was not assessed per patient preference since she has regular follow up with her ophthalmologist. Hearing and body mass index were assessed and reviewed.   During the course of the visit the patient was educated and counseled about appropriate screening and preventive services including : fall prevention , diabetes screening, nutrition counseling, colorectal cancer screening, and recommended immunizations.    Objective  BP 138/72  Pulse 63  Temp(Src) 97.6 F (36.4 C) (Oral)  Resp 15  Wt 189 lb 4 oz (85.843 kg)  BMI 31 kg/m2  SpO2 97%  General Appearance:    Alert, cooperative, no distress, appears stated age  Head:    Normocephalic, without obvious abnormality, atraumatic  Eyes:    PERRL, conjunctiva/corneas clear, EOM's intact, fundi    benign, both eyes  Ears:    Normal TM's and external ear canals, both ears  Nose:   Nares normal, septum midline, mucosa normal, no drainage    or sinus tenderness  Throat:   Lips, mucosa, and tongue normal; teeth and gums normal  Neck:   Supple, symmetrical, trachea midline, no adenopathy;    thyroid:  no enlargement/tenderness/nodules; no carotid   bruit or JVD  Back:     Symmetric, no curvature, ROM normal, no CVA tenderness  Lungs:     Clear to auscultation bilaterally, respirations unlabored  Chest Wall:    No tenderness or deformity   Heart:    Regular rate and rhythm, S1 and S2  normal, no murmur, rub   or gallop  Breast Exam:    No tenderness, masses, or nipple abnormality  Abdomen:     Soft, non-tender, bowel sounds active all four quadrants,    no masses, no organomegaly  Genitalia:    Normal female without lesion, discharge or tenderness  Rectal:    Normal tone,  no masses or tenderness;   guaiac negative stool  Extremities:   Extremities normal, atraumatic, no cyanosis or edema  Pulses:   2+ and symmetric all  extremities  Skin:   Skin color, texture, turgor normal, no rashes or lesions  Lymph nodes:   Cervical, supraclavicular, and axillary nodes normal  Neurologic:   CNII-XII intact, normal strength, sensation and reflexes    throughout    Assessment and Plan  Routine general medical examination at a health care facility Pelvic, breast and rectal exam today .  fobt was negative  Labs were done at [facility] prior to visit.,  BMET was repeated in a nonfasting state to confirm stability of GFR   Screening for colon cancer Due in June for follow up colonsocopy with [facility]    Breast screening, unspecified She has decided to stop screening in one year. Mammogram ordered.   Hypertension Well controlled on current regimen. Renal function stable, no changes today.   Updated Medication List Outpatient Encounter Prescriptions as of 03/17/2013  Medication Sig Dispense Refill  . aspirin 81 MG tablet Take 160 mg by mouth daily.      . Calcium-Vitamin D (CALTRATE 600 PLUS-VIT D PO) Take by mouth daily      . enalapril (VASOTEC) 20 MG tablet Take 1 tablet (20 mg total) by mouth daily.  90 tablet  3  . levothyroxine (SYNTHROID, LEVOTHROID) 50 MCG tablet Take 1 tablet (50 mcg total) by mouth daily.  90 tablet  3  . Multiple Vitamins-Minerals (PRESERVISION AREDS PO) Take by mouth.       No facility-administered encounter medications on file as of 03/17/2013.

## 2013-03-17 NOTE — Assessment & Plan Note (Signed)
She has decided to stop screening in one year. Mammogram ordered.

## 2013-03-17 NOTE — Assessment & Plan Note (Signed)
Due in June for follow y\up colonsocopy with wilton [employer]

## 2013-03-17 NOTE — Patient Instructions (Addendum)
Your labs were normal  We will have Dr Rochel Brome call yo uto set up your colonoscopy

## 2013-03-17 NOTE — Assessment & Plan Note (Signed)
Well controlled on current regimen. Renal function stable, no changes today. 

## 2013-04-03 ENCOUNTER — Encounter: Payer: Self-pay | Admitting: Internal Medicine

## 2013-04-25 ENCOUNTER — Other Ambulatory Visit: Payer: Self-pay | Admitting: *Deleted

## 2013-04-25 ENCOUNTER — Telehealth: Payer: Self-pay | Admitting: *Deleted

## 2013-04-25 MED ORDER — LEVOTHYROXINE SODIUM 50 MCG PO TABS: 50.0000 ug | ORAL_TABLET | Freq: Every day | ORAL | Status: DC

## 2013-04-25 NOTE — Telephone Encounter (Signed)
Must have your verbal okay to switch patient synthroid to current store brand they are using called [facility].

## 2013-04-25 NOTE — Telephone Encounter (Signed)
Rx sent to pharmacy by escript  

## 2013-04-30 DIAGNOSIS — Z1211 Encounter for screening for malignant neoplasm of colon: Secondary | ICD-10-CM | POA: Diagnosis not present

## 2013-04-30 LAB — HM COLONOSCOPY

## 2013-05-19 ENCOUNTER — Telehealth: Payer: Self-pay | Admitting: Internal Medicine

## 2013-05-19 NOTE — Telephone Encounter (Signed)
Patient states she has a UTI and is wanting to be seen. We do not have any available appointments. Please advise.

## 2013-05-19 NOTE — Telephone Encounter (Signed)
Patient has appointment for tomorrow at 9 am

## 2013-05-20 ENCOUNTER — Ambulatory Visit (INDEPENDENT_AMBULATORY_CARE_PROVIDER_SITE_OTHER): Payer: Medicare Other | Admitting: Adult Health

## 2013-05-20 ENCOUNTER — Encounter: Payer: Self-pay | Admitting: Adult Health

## 2013-05-20 VITALS — BP 130/68 | HR 80 | Temp 97.9°F | Resp 14 | Wt 188.0 lb

## 2013-05-20 DIAGNOSIS — R350 Frequency of micturition: Secondary | ICD-10-CM

## 2013-05-20 DIAGNOSIS — R300 Dysuria: Secondary | ICD-10-CM

## 2013-05-20 LAB — POCT URINALYSIS DIPSTICK
Bilirubin, UA: NEGATIVE
Glucose, UA: NEGATIVE
Ketones, UA: NEGATIVE
Nitrite, UA: POSITIVE
Protein, UA: >=300
Spec Grav, UA: 1.02
Urobilinogen, UA: 0.2
pH, UA: 6.5

## 2013-05-20 MED ORDER — CIPROFLOXACIN HCL 250 MG PO TABS: 250.0000 mg | ORAL_TABLET | Freq: Two times a day (BID) | ORAL | Status: DC

## 2013-05-20 NOTE — Patient Instructions (Addendum)
  I am treating you for a urinary tract infection.  Start Cipro 250 mg twice a day for 3 days.  You can also take AZO which is sold over the counter. This helps with your urinary discomfort.   Below is information on UTIs:  What is the urinary tract? - The urinary tract is the group of organs in the body that handle urine. The urinary tract includes the:   Kidneys, two bean-shaped organs that filter the blood to make urine  Bladder, a balloon-shaped organ that stores urine  Ureters, two tubes that carry urine from the kidneys to the bladder  Urethra, the tube that carries urine from the bladder to the outside of the body   What are urinary tract infections? - Urinary tract infections, also called "UTIs," are infections that affect either the bladder or the kidneys. Bladder infections are more common than kidney infections. Bladder infections happen when bacteria get into the urethra and travel up into the bladder. Kidney infections happen when the bacteria travel even higher, up into the kidneys. Both bladder and kidney infections are more common in women than men.   What are the symptoms of a bladder infection? - The symptoms include:   Pain or a burning feeling when you urinate  The need to urinate often  The need to urinate suddenly or in a hurry  Blood in the urine   What are the symptoms of a kidney infection? - The symptoms of a kidney infection can include the symptoms of a bladder infection, but kidney infections can also cause:   Fever  Back pain  Nausea or vomiting   How are urinary tract infections treated? - Most urinary tract infections are treated with antibiotic pills. These pills work by killing the germs that cause the infection.  If you have a bladder infection, you will probably need to take antibiotics for 3 to 7 days. If you have a kidney infection, you will probably need to take antibiotics for longer-maybe for up to 2 weeks. If you have a kidney infection,  it's also possible you will need to be treated in the hospital.   Your symptoms should begin to improve within a day of starting antibiotics. But you should finish all the antibiotic pills you get. Otherwise your infection might come back.  If needed, you can also take a medicine to numb your bladder such as AZO or Urostat which are sold over the counter.   Can cranberry juice or other cranberry products prevent bladder infections? - The studies suggesting that cranberry products prevent bladder infections are not very good. But if you want to try cranberry products for this purpose, there is probably not much harm in doing so.

## 2013-05-20 NOTE — Progress Notes (Signed)
  Subjective:    Patient ID: Pamela Paul, female    DOB: 1926-10-08, 79 y.o.   MRN: [id number]  HPI  Pt presents with dysuria, urgency, frequency. Symptoms began on Sunday. She has not taken any OTC products for her symptoms.   Current Outpatient Prescriptions on File Prior to Visit  Medication Sig Dispense Refill  . aspirin 81 MG tablet Take 160 mg by mouth daily.      . Calcium-Vitamin D (CALTRATE 600 PLUS-VIT D PO) Take by mouth daily      . enalapril (VASOTEC) 20 MG tablet Take 1 tablet (20 mg total) by mouth daily.  90 tablet  3  . levothyroxine (SYNTHROID, LEVOTHROID) 50 MCG tablet Take 1 tablet (50 mcg total) by mouth daily.  90 tablet  3  . Multiple Vitamins-Minerals (PRESERVISION AREDS PO) Take by mouth.       No current facility-administered medications on file prior to visit.     Review of Systems  Constitutional: Positive for fever and chills.  Genitourinary: Positive for dysuria, urgency, frequency and flank pain. Negative for hematuria.       Urine cloudy with odor    BP 130/68  Pulse 80  Temp(Src) 97.9 F (36.6 C) (Oral)  Resp 14  Wt 188 lb (85.276 kg)  BMI 30.8 kg/m2  SpO2 96%    Objective:   Physical Exam  Constitutional: She is oriented to person, place, and time. She appears well-developed and well-nourished.  Cardiovascular: Normal rate.   Pulmonary/Chest: Effort normal. No respiratory distress.  Genitourinary:  Suprapubic tenderness  Neurological: She is alert and oriented to person, place, and time.  Skin: Skin is warm and dry.  Psychiatric: She has a normal mood and affect. Her behavior is normal. Judgment and thought content normal.       Assessment & Plan:

## 2013-05-20 NOTE — Assessment & Plan Note (Signed)
Start Cipro 250 mg twice a day x3 days. May take AZO for urinary discomfort. Send for culture.

## 2013-05-22 DIAGNOSIS — L821 Other seborrheic keratosis: Secondary | ICD-10-CM | POA: Diagnosis not present

## 2013-05-22 DIAGNOSIS — L570 Actinic keratosis: Secondary | ICD-10-CM | POA: Diagnosis not present

## 2013-05-22 DIAGNOSIS — D485 Neoplasm of uncertain behavior of skin: Secondary | ICD-10-CM | POA: Diagnosis not present

## 2013-05-23 ENCOUNTER — Telehealth: Payer: Self-pay | Admitting: *Deleted

## 2013-05-23 LAB — URINE CULTURE: Colony Count: >=100000

## 2013-05-23 MED ORDER — SULFAMETHOXAZOLE-TRIMETHOPRIM 800-160 MG PO TABS: 1.0000 | ORAL_TABLET | Freq: Two times a day (BID) | ORAL | Status: DC

## 2013-05-23 NOTE — Telephone Encounter (Signed)
Pt presented to office, took last Cipro this morning and urinary symptoms have not improved. Notified pt of urine culture. Charolette Forward, NP advised to change to Bactrim 160/800 mg bid x 3 days. Rx sent to [facility] via escript. Pt verbalized understanding. Advised to call back with failure of improvement.

## 2013-06-11 ENCOUNTER — Ambulatory Visit: Payer: Self-pay | Admitting: Gastroenterology

## 2013-06-11 DIAGNOSIS — E039 Hypothyroidism, unspecified: Secondary | ICD-10-CM | POA: Diagnosis not present

## 2013-06-11 DIAGNOSIS — K5730 Diverticulosis of large intestine without perforation or abscess without bleeding: Secondary | ICD-10-CM | POA: Diagnosis not present

## 2013-06-11 DIAGNOSIS — E785 Hyperlipidemia, unspecified: Secondary | ICD-10-CM | POA: Diagnosis not present

## 2013-06-11 DIAGNOSIS — K219 Gastro-esophageal reflux disease without esophagitis: Secondary | ICD-10-CM | POA: Diagnosis not present

## 2013-06-11 DIAGNOSIS — Z87891 Personal history of nicotine dependence: Secondary | ICD-10-CM | POA: Diagnosis not present

## 2013-06-11 DIAGNOSIS — Z1211 Encounter for screening for malignant neoplasm of colon: Secondary | ICD-10-CM | POA: Diagnosis not present

## 2013-06-11 DIAGNOSIS — Z9049 Acquired absence of other specified parts of digestive tract: Secondary | ICD-10-CM | POA: Diagnosis not present

## 2013-06-11 DIAGNOSIS — I059 Rheumatic mitral valve disease, unspecified: Secondary | ICD-10-CM | POA: Diagnosis not present

## 2013-06-11 DIAGNOSIS — Z7982 Long term (current) use of aspirin: Secondary | ICD-10-CM | POA: Diagnosis not present

## 2013-06-11 DIAGNOSIS — I10 Essential (primary) hypertension: Secondary | ICD-10-CM | POA: Diagnosis not present

## 2013-06-11 DIAGNOSIS — Z79899 Other long term (current) drug therapy: Secondary | ICD-10-CM | POA: Diagnosis not present

## 2013-06-11 DIAGNOSIS — Z96659 Presence of unspecified artificial knee joint: Secondary | ICD-10-CM | POA: Diagnosis not present

## 2013-06-19 ENCOUNTER — Telehealth: Payer: Self-pay | Admitting: Internal Medicine

## 2013-06-19 LAB — HM COLONOSCOPY

## 2013-07-09 ENCOUNTER — Other Ambulatory Visit: Payer: Self-pay

## 2013-07-10 ENCOUNTER — Encounter: Payer: Self-pay | Admitting: Internal Medicine

## 2013-07-22 DIAGNOSIS — Z961 Presence of intraocular lens: Secondary | ICD-10-CM | POA: Diagnosis not present

## 2013-07-22 DIAGNOSIS — H3530 Unspecified macular degeneration: Secondary | ICD-10-CM | POA: Diagnosis not present

## 2013-07-22 DIAGNOSIS — H43819 Vitreous degeneration, unspecified eye: Secondary | ICD-10-CM | POA: Diagnosis not present

## 2013-07-24 DIAGNOSIS — C44320 Squamous cell carcinoma of skin of unspecified parts of face: Secondary | ICD-10-CM | POA: Diagnosis not present

## 2013-07-24 DIAGNOSIS — L578 Other skin changes due to chronic exposure to nonionizing radiation: Secondary | ICD-10-CM | POA: Diagnosis not present

## 2013-07-24 DIAGNOSIS — L570 Actinic keratosis: Secondary | ICD-10-CM | POA: Diagnosis not present

## 2013-07-24 DIAGNOSIS — Z85828 Personal history of other malignant neoplasm of skin: Secondary | ICD-10-CM | POA: Diagnosis not present

## 2013-07-30 ENCOUNTER — Other Ambulatory Visit: Payer: Self-pay | Admitting: *Deleted

## 2013-07-30 MED ORDER — ENALAPRIL MALEATE 20 MG PO TABS: 20.0000 mg | ORAL_TABLET | Freq: Every day | ORAL | Status: DC

## 2013-10-02 DIAGNOSIS — Z23 Encounter for immunization: Secondary | ICD-10-CM | POA: Diagnosis not present

## 2013-10-09 ENCOUNTER — Encounter: Payer: Self-pay | Admitting: Adult Health

## 2013-10-09 ENCOUNTER — Other Ambulatory Visit: Payer: Self-pay

## 2013-10-09 ENCOUNTER — Ambulatory Visit (INDEPENDENT_AMBULATORY_CARE_PROVIDER_SITE_OTHER): Payer: Medicare Other | Admitting: Adult Health

## 2013-10-09 VITALS — BP 128/70 | HR 89 | Temp 98.3°F | Resp 12 | Wt 193.0 lb

## 2013-10-09 DIAGNOSIS — R300 Dysuria: Secondary | ICD-10-CM

## 2013-10-09 LAB — POCT URINALYSIS DIPSTICK
Bilirubin, UA: NEGATIVE
Glucose, UA: NEGATIVE
Ketones, UA: NEGATIVE
Nitrite, UA: POSITIVE
Protein, UA: 100
Spec Grav, UA: 1.01
Urobilinogen, UA: 0.2
pH, UA: 6

## 2013-10-09 MED ORDER — CIPROFLOXACIN HCL 500 MG PO TABS: ORAL_TABLET | ORAL | Status: DC

## 2013-10-09 NOTE — Patient Instructions (Signed)
Urinary Tract Infection  Urinary tract infections (UTIs) can develop anywhere along your urinary tract. Your urinary tract is your body's drainage system for removing wastes and extra water. Your urinary tract includes two kidneys, two ureters, a bladder, and a urethra. Your kidneys are a pair of bean-shaped organs. Each kidney is about the size of your fist. They are located below your ribs, one on each side of your spine.  CAUSES  Infections are caused by microbes, which are microscopic organisms, including fungi, viruses, and bacteria. These organisms are so small that they can only be seen through a microscope. Bacteria are the microbes that most commonly cause UTIs.  SYMPTOMS   Symptoms of UTIs may vary by age and gender of the patient and by the location of the infection. Symptoms in young women typically include a frequent and intense urge to urinate and a painful, burning feeling in the bladder or urethra during urination. Older women and men are more likely to be tired, shaky, and weak and have muscle aches and abdominal pain. A fever may mean the infection is in your kidneys. Other symptoms of a kidney infection include pain in your back or sides below the ribs, nausea, and vomiting.  DIAGNOSIS  To diagnose a UTI, your caregiver will ask you about your symptoms. Your caregiver also will ask to provide a urine sample. The urine sample will be tested for bacteria and white blood cells. White blood cells are made by your body to help fight infection.  TREATMENT   Typically, UTIs can be treated with medication. Because most UTIs are caused by a bacterial infection, they usually can be treated with the use of antibiotics. The choice of antibiotic and length of treatment depend on your symptoms and the type of bacteria causing your infection.  HOME CARE INSTRUCTIONS   If you were prescribed antibiotics, take them exactly as your caregiver instructs you. Finish the medication even if you feel better after you  have only taken some of the medication.   Drink enough water and fluids to keep your urine clear or pale yellow.   Avoid caffeine, tea, and carbonated beverages. They tend to irritate your bladder.   Empty your bladder often. Avoid holding urine for long periods of time.   Empty your bladder before and after sexual intercourse.   After a bowel movement, women should cleanse from front to back. Use each tissue only once.  SEEK MEDICAL CARE IF:    You have back pain.   You develop a fever.   Your symptoms do not begin to resolve within 3 days.  SEEK IMMEDIATE MEDICAL CARE IF:    You have severe back pain or lower abdominal pain.   You develop chills.   You have nausea or vomiting.   You have continued burning or discomfort with urination.  MAKE SURE YOU:    Understand these instructions.   Will watch your condition.   Will get help right away if you are not doing well or get worse.  Document Released: 08/30/2005 Document Revised: 05/21/2012 Document Reviewed: 12/29/2011  Exit[employer] Patient Information 2014 [employer].

## 2013-10-09 NOTE — Progress Notes (Signed)
  Subjective:    Patient ID: Pamela Paul, female    DOB: 20-Apr-1926, 79 y.o.   MRN: [id number]  HPI  Pt is a very pleasant 79 y/o female who presents to clinic with dysuria which began last night. Feeling pressure to urinate and not fully emptying bladder. No fever, hematuria.   Review of Systems  Constitutional: Negative for fever and chills.  Genitourinary: Positive for dysuria, urgency and frequency. Negative for hematuria.       Objective:   Physical Exam  Constitutional: She is oriented to person, place, and time.  Pleasant 79 y/o female in NAD  Cardiovascular: Normal rate and regular rhythm.   Pulmonary/Chest: Effort normal and breath sounds normal.  Neurological: She is alert and oriented to person, place, and time.  Skin: Skin is warm and dry.  Psychiatric: She has a normal mood and affect. Her behavior is normal. Judgment and thought content normal.    BP 128/70  Pulse 89  Temp(Src) 98.3 F (36.8 C) (Oral)  Resp 12  Wt 193 lb (87.544 kg)  SpO2 98%       Assessment & Plan:

## 2013-10-09 NOTE — Progress Notes (Signed)
Pre-visit discussion using our clinic review tool. No additional management support is needed unless otherwise documented below in the visit note.  

## 2013-10-09 NOTE — Assessment & Plan Note (Signed)
Large 3+ leuks, pos nitrites. Send for culture. Cipro 500 mg bid x 7 days.

## 2013-10-11 LAB — URINE CULTURE: Colony Count: >=100000

## 2013-10-13 ENCOUNTER — Encounter: Payer: Self-pay | Admitting: Adult Health

## 2013-10-15 NOTE — Telephone Encounter (Signed)
Called pt and notified of results. States symptoms have resolved.

## 2013-12-12 DIAGNOSIS — H903 Sensorineural hearing loss, bilateral: Secondary | ICD-10-CM | POA: Diagnosis not present

## 2014-01-05 DIAGNOSIS — M545 Low back pain, unspecified: Secondary | ICD-10-CM | POA: Insufficient documentation

## 2014-01-05 DIAGNOSIS — Z981 Arthrodesis status: Secondary | ICD-10-CM | POA: Diagnosis not present

## 2014-01-31 ENCOUNTER — Other Ambulatory Visit: Payer: Self-pay | Admitting: Internal Medicine

## 2014-02-02 NOTE — Telephone Encounter (Signed)
Appt 03/17/14

## 2014-02-10 DIAGNOSIS — Z961 Presence of intraocular lens: Secondary | ICD-10-CM | POA: Diagnosis not present

## 2014-02-10 DIAGNOSIS — H3530 Unspecified macular degeneration: Secondary | ICD-10-CM | POA: Diagnosis not present

## 2014-02-10 DIAGNOSIS — H43819 Vitreous degeneration, unspecified eye: Secondary | ICD-10-CM | POA: Diagnosis not present

## 2014-02-10 DIAGNOSIS — H35369 Drusen (degenerative) of macula, unspecified eye: Secondary | ICD-10-CM | POA: Diagnosis not present

## 2014-03-02 ENCOUNTER — Telehealth: Payer: Self-pay | Admitting: Internal Medicine

## 2014-03-02 NOTE — Telephone Encounter (Signed)
Letter drafter  pls print

## 2014-03-02 NOTE — Telephone Encounter (Signed)
Please advise 

## 2014-03-02 NOTE — Telephone Encounter (Signed)
The patient wants lab orders sent to [facility] for her physical she has scheduled on 5.18.15.

## 2014-03-03 NOTE — Telephone Encounter (Signed)
Placed in red folder  

## 2014-03-03 NOTE — Telephone Encounter (Signed)
Letter printed for labs.

## 2014-03-03 NOTE — Telephone Encounter (Signed)
Never mind I meant sent to facility.

## 2014-03-17 ENCOUNTER — Encounter: Payer: Medicare Other | Admitting: Internal Medicine

## 2014-03-17 NOTE — Telephone Encounter (Signed)
Order faxed as requested

## 2014-04-16 DIAGNOSIS — Z79899 Other long term (current) drug therapy: Secondary | ICD-10-CM | POA: Diagnosis not present

## 2014-04-16 DIAGNOSIS — R5381 Other malaise: Secondary | ICD-10-CM | POA: Diagnosis not present

## 2014-04-16 DIAGNOSIS — E785 Hyperlipidemia, unspecified: Secondary | ICD-10-CM | POA: Diagnosis not present

## 2014-04-16 DIAGNOSIS — R5383 Other fatigue: Secondary | ICD-10-CM | POA: Diagnosis not present

## 2014-04-16 DIAGNOSIS — E559 Vitamin D deficiency, unspecified: Secondary | ICD-10-CM | POA: Diagnosis not present

## 2014-04-16 LAB — BASIC METABOLIC PANEL
BUN: 28 mg/dL — AB (ref 4–21)
Creatinine: 1.4 mg/dL — AB (ref 0.5–1.1)
Glucose: 91 mg/dL
Potassium: 4.4 mmol/L (ref 3.4–5.3)
Sodium: 138 mmol/L (ref 137–147)

## 2014-04-16 LAB — HEPATIC FUNCTION PANEL
ALT: 23 U/L (ref 7–35)
AST: 18 U/L (ref 13–35)
Alkaline Phosphatase: 43 U/L (ref 25–125)
Bilirubin, Total: 0.6 mg/dL

## 2014-04-16 LAB — LIPID PANEL
Cholesterol: 206 mg/dL — AB (ref 0–200)
HDL: 46 mg/dL (ref 35–70)
LDL Cholesterol: 120 mg/dL
Triglycerides: 202 mg/dL — AB (ref 40–160)

## 2014-04-16 LAB — CBC AND DIFFERENTIAL
HCT: 37 % (ref 36–46)
Hemoglobin: 12.6 g/dL (ref 12.0–16.0)
Platelets: 279 10*3/uL (ref 150–399)
WBC: 6.3 10^3/mL

## 2014-04-16 LAB — TSH: TSH: 2.9 u[IU]/mL (ref 0.41–5.90)

## 2014-04-19 ENCOUNTER — Encounter: Payer: Self-pay | Admitting: Internal Medicine

## 2014-04-20 ENCOUNTER — Encounter: Payer: Self-pay | Admitting: Internal Medicine

## 2014-04-20 ENCOUNTER — Ambulatory Visit (INDEPENDENT_AMBULATORY_CARE_PROVIDER_SITE_OTHER): Payer: Medicare Other | Admitting: Internal Medicine

## 2014-04-20 VITALS — BP 128/68 | HR 67 | Temp 97.4°F | Resp 16 | Ht 65.5 in | Wt 190.2 lb

## 2014-04-20 DIAGNOSIS — Z0000 Encounter for general adult medical examination without abnormal findings: Secondary | ICD-10-CM | POA: Diagnosis not present

## 2014-04-20 DIAGNOSIS — M8580 Other specified disorders of bone density and structure, unspecified site: Secondary | ICD-10-CM

## 2014-04-20 DIAGNOSIS — N183 Chronic kidney disease, stage 3 unspecified: Secondary | ICD-10-CM

## 2014-04-20 DIAGNOSIS — Z1239 Encounter for other screening for malignant neoplasm of breast: Secondary | ICD-10-CM | POA: Diagnosis not present

## 2014-04-20 DIAGNOSIS — I10 Essential (primary) hypertension: Secondary | ICD-10-CM

## 2014-04-20 DIAGNOSIS — M949 Disorder of cartilage, unspecified: Secondary | ICD-10-CM

## 2014-04-20 DIAGNOSIS — N1832 Chronic kidney disease, stage 3b: Secondary | ICD-10-CM | POA: Insufficient documentation

## 2014-04-20 DIAGNOSIS — E785 Hyperlipidemia, unspecified: Secondary | ICD-10-CM

## 2014-04-20 DIAGNOSIS — M48061 Spinal stenosis, lumbar region without neurogenic claudication: Secondary | ICD-10-CM

## 2014-04-20 DIAGNOSIS — M899 Disorder of bone, unspecified: Secondary | ICD-10-CM | POA: Diagnosis not present

## 2014-04-20 DIAGNOSIS — E039 Hypothyroidism, unspecified: Secondary | ICD-10-CM

## 2014-04-20 DIAGNOSIS — N184 Chronic kidney disease, stage 4 (severe): Secondary | ICD-10-CM | POA: Insufficient documentation

## 2014-04-20 MED ORDER — GABAPENTIN 100 MG PO CAPS: 100.0000 mg | ORAL_CAPSULE | Freq: Every day | ORAL | Status: DC

## 2014-04-20 NOTE — Assessment & Plan Note (Signed)
Discussed today.  priror labs reviewed,  Cr is stable.  Advised to avoid NSAIDs

## 2014-04-20 NOTE — Progress Notes (Signed)
Patient ID: Pamela Paul, female   DOB: Jun 12, 1926, 80 y.o.   MRN: [id number]  The patient is here for annual Medicare wellness examination and management of other chronic and acute problems.  Some chronic pain due to spinal stenosis and DJD left knee  Gets pain radiation to the toe.  On the left. .  Living at [facility]    The risk factors are reflected in the social history.  The roster of all physicians providing medical care to patient - is listed in the Snapshot section of the chart.  Activities of daily living:  The patient is 100% independent in all ADLs: dressing, toileting, feeding as well as independent mobility  Home safety : The patient has smoke detectors in the home. They wear seatbelts.  There are no firearms at home. There is no violence in the home.   There is no risks for hepatitis, STDs or HIV. There is no   history of blood transfusion. They have no travel history to infectious disease endemic areas of the world.  The patient has seen their dentist in the last six month. They have seen their eye doctor in the last year. They admit to slight hearing difficulty with regard to whispered voices and some television programs.  They have deferred audiologic testing in the last year.  They do not  have excessive sun exposure. Discussed the need for sun protection: hats, long sleeves and use of sunscreen if there is significant sun exposure.   Diet: the importance of a healthy diet is discussed. They do have a healthy diet.  The benefits of regular aerobic exercise were discussed. She walks 4 times per week ,  20 minutes.   Depression screen: there are no signs or vegative symptoms of depression- irritability, change in appetite, anhedonia, sadness/tearfullness.  Cognitive assessment: the patient manages all their financial and personal affairs and is actively engaged. They could relate day,date,year and events; recalled 2/3 objects at 3 minutes; performed clock-face test  normally.  The following portions of the patient's history were reviewed and updated as appropriate: allergies, current medications, past family history, past medical history,  past surgical history, past social history  and problem list.  Visual acuity was not assessed per patient preference since she has regular follow up with her ophthalmologist. Hearing and body mass index were assessed and reviewed.   During the course of the visit the patient was educated and counseled about appropriate screening and preventive services including : fall prevention , diabetes screening, nutrition counseling, colorectal cancer screening, and recommended immunizations.    Objective:   BP 128/68  Pulse 67  Temp(Src) 97.4 F (36.3 C) (Oral)  Resp 16  Ht 5' 5.5" (1.664 m)  Wt 190 lb 4 oz (86.297 kg)  BMI 31.17 kg/m2  SpO2 99%  General appearance: alert, cooperative and appears stated age Head: Normocephalic, without obvious abnormality, atraumatic Eyes: conjunctivae/corneas clear. PERRL, EOM's intact. Fundi benign. Ears: normal TM's and external ear canals both ears Nose: Nares normal. Septum midline. Mucosa normal. No drainage or sinus tenderness. Throat: lips, mucosa, and tongue normal; teeth and gums normal Neck: no adenopathy, no carotid bruit, no JVD, supple, symmetrical, trachea midline and thyroid not enlarged, symmetric, no tenderness/mass/nodules Lungs: clear to auscultation bilaterally Breasts: normal appearance, no masses or tenderness Heart: regular rate and rhythm, S1, S2 normal, no murmur, click, rub or gallop Abdomen: soft, non-tender; bowel sounds normal; no masses,  no organomegaly Extremities: extremities normal, atraumatic, no cyanosis or edema Pulses: 2+  and symmetric Skin: Skin color, texture, turgor normal. No rashes or lesions Neurologic: Alert and oriented X 3, normal strength and tone. Normal symmetric reflexes. Normal coordination and gait.   Assessment and Plan  Spinal  stenosis of lumbar region Left leg radiation .  Has seen surgery,  No intervention recommened.  occasional  Left leg numbness andd tingling without actual severe pain.  Trial of gabapentin  Hypertension Well controlled on current regimen. Renal function is diminished but  stable, no changes today.  Hyperlipidemia LDL and triglycerides are at goal on current medications. sHe has no side effects and liver enzymes are normal. No changes today.  Lab Results  Component Value Date   CHOL 206* 04/16/2014   HDL 46 04/16/2014   LDLCALC 120 04/16/2014   TRIG 202* 04/16/2014   Lab Results  Component Value Date   ALT 23 04/16/2014   AST 18 04/16/2014   ALKPHOS 43 04/16/2014      CKD (chronic kidney disease) stage 3, GFR 30-59 ml/min Discussed today.  priror labs reviewed,  Cr is stable.  Advised to avoid NSAIDs  Hypothyroidism Thyroid function is WNL on current dose.  No current changes needed. Lab Results  Component Value Date   TSH 2.90 04/16/2014     Encounter for preventive health examination Annual comprehensive exam was done including breast, excluding pelvic and PAP smear. All screenings have been addressed .   Breast screening, unspecified Given her younger than age condition, discussed continuing screening mammograms until age 78   Updated Medication List Outpatient Encounter Prescriptions as of 04/20/2014  Medication Sig  . aspirin 81 MG tablet Take 160 mg by mouth daily.  . Calcium-Vitamin D (CALTRATE 600 PLUS-VIT D PO) Take by mouth daily  . enalapril (VASOTEC) 20 MG tablet TAKE ONE TABLET BY MOUTH ONCE DAILY  . levothyroxine (SYNTHROID, LEVOTHROID) 50 MCG tablet Take 1 tablet (50 mcg total) by mouth daily.  . ciprofloxacin (CIPRO) 500 MG tablet Take 1 tablet twice a day for 7 days.  Marland Kitchen gabapentin (NEURONTIN) 100 MG capsule Take 1 capsule (100 mg total) by mouth at bedtime. May increase weekly to max 3 tablets daily  . Multiple Vitamins-Minerals (PRESERVISION AREDS PO) Take  by mouth.

## 2014-04-20 NOTE — Assessment & Plan Note (Signed)
Thyroid function is WNL on current dose.  No current changes needed. Lab Results  Component Value Date   TSH 2.90 04/16/2014

## 2014-04-20 NOTE — Assessment & Plan Note (Signed)
LDL and triglycerides are at goal on current medications. sHe has no side effects and liver enzymes are normal. No changes today.  Lab Results  Component Value Date   CHOL 206* 04/16/2014   HDL 46 04/16/2014   LDLCALC 120 04/16/2014   TRIG 202* 04/16/2014   Lab Results  Component Value Date   ALT 23 04/16/2014   AST 18 04/16/2014   ALKPHOS 43 04/16/2014

## 2014-04-20 NOTE — Assessment & Plan Note (Signed)
Given her younger than age condition, discussed continuing screening mammograms until age 80

## 2014-04-20 NOTE — Progress Notes (Signed)
Pre-visit discussion using our clinic review tool. No additional management support is needed unless otherwise documented below in the visit note.  

## 2014-04-20 NOTE — Assessment & Plan Note (Signed)
Annual comprehensive exam was done including breast, excluding pelvic and PAP smear. All screenings have been addressed .  

## 2014-04-20 NOTE — Assessment & Plan Note (Addendum)
Left leg radiation .  Has seen surgery,  No intervention recommened.  occasional  Left leg numbness andd tingling without actual severe pain.  Trial of gabapentin

## 2014-04-20 NOTE — Assessment & Plan Note (Signed)
Well controlled on current regimen. Renal function is diminished but  stable, no changes today.

## 2014-04-20 NOTE — Patient Instructions (Signed)
You had your annual Medicare wellness exam today  We will schedule your mammogram and your bone density test soon.  We will contact you with the bloodwork results  I recommend the new pneumonia vaccine (Prevnar) .  Check with [facility]  Your cholesterol,  Kidney function are unchanged and stable/normal.   Return in 6 months

## 2014-04-21 ENCOUNTER — Telehealth: Payer: Self-pay | Admitting: Internal Medicine

## 2014-04-21 NOTE — Telephone Encounter (Signed)
Relevant patient education assigned to patient using Emmi. ° °

## 2014-04-30 ENCOUNTER — Telehealth: Payer: Self-pay | Admitting: Internal Medicine

## 2014-04-30 NOTE — Telephone Encounter (Signed)
The patient  last Colonoscopy was 5.28.14 with Dr. Eddie Dibbles [facility] .

## 2014-05-02 ENCOUNTER — Other Ambulatory Visit: Payer: Self-pay | Admitting: Internal Medicine

## 2014-05-21 DIAGNOSIS — Z85828 Personal history of other malignant neoplasm of skin: Secondary | ICD-10-CM | POA: Diagnosis not present

## 2014-05-21 DIAGNOSIS — L570 Actinic keratosis: Secondary | ICD-10-CM | POA: Diagnosis not present

## 2014-05-28 DIAGNOSIS — H00029 Hordeolum internum unspecified eye, unspecified eyelid: Secondary | ICD-10-CM | POA: Diagnosis not present

## 2014-05-28 DIAGNOSIS — H0289 Other specified disorders of eyelid: Secondary | ICD-10-CM | POA: Diagnosis not present

## 2014-05-28 DIAGNOSIS — H00019 Hordeolum externum unspecified eye, unspecified eyelid: Secondary | ICD-10-CM | POA: Diagnosis not present

## 2014-05-28 DIAGNOSIS — H01009 Unspecified blepharitis unspecified eye, unspecified eyelid: Secondary | ICD-10-CM | POA: Diagnosis not present

## 2014-08-11 DIAGNOSIS — H43819 Vitreous degeneration, unspecified eye: Secondary | ICD-10-CM | POA: Diagnosis not present

## 2014-08-11 DIAGNOSIS — Z961 Presence of intraocular lens: Secondary | ICD-10-CM | POA: Diagnosis not present

## 2014-08-11 DIAGNOSIS — H353190 Nonexudative age-related macular degeneration, unspecified eye, stage unspecified: Secondary | ICD-10-CM | POA: Diagnosis not present

## 2014-09-24 DIAGNOSIS — Z23 Encounter for immunization: Secondary | ICD-10-CM | POA: Diagnosis not present

## 2014-10-19 ENCOUNTER — Telehealth: Payer: Self-pay | Admitting: *Deleted

## 2014-10-19 NOTE — Telephone Encounter (Signed)
Rx faxed to [phone]

## 2014-10-19 NOTE — Telephone Encounter (Signed)
Pt called states she has appoint on 11.23.15.  She is requesting an order be sent to [facility] for her needed labs prior to her appoint.  Please advise

## 2014-10-19 NOTE — Telephone Encounter (Signed)
On printer

## 2014-10-22 DIAGNOSIS — Z79899 Other long term (current) drug therapy: Secondary | ICD-10-CM | POA: Diagnosis not present

## 2014-10-22 DIAGNOSIS — E785 Hyperlipidemia, unspecified: Secondary | ICD-10-CM | POA: Diagnosis not present

## 2014-10-22 LAB — HEPATIC FUNCTION PANEL
ALT: 18 U/L (ref 7–35)
AST: 23 U/L (ref 13–35)
Alkaline Phosphatase: 42 U/L (ref 25–125)
Bilirubin, Total: 0.6 mg/dL

## 2014-10-22 LAB — LIPID PANEL
Cholesterol: 207 mg/dL — AB (ref 0–200)
HDL: 47 mg/dL (ref 35–70)
LDL Cholesterol: 104 mg/dL
Triglycerides: 281 mg/dL — AB (ref 40–160)

## 2014-10-22 LAB — BASIC METABOLIC PANEL WITH GFR
BUN: 26 mg/dL — AB (ref 4–21)
Creatinine: 1.4 mg/dL — AB (ref 0.5–1.1)
Glucose: 98 mg/dL
Potassium: 4.5 mmol/L (ref 3.4–5.3)
Sodium: 139 mmol/L (ref 137–147)

## 2014-10-22 LAB — CBC AND DIFFERENTIAL
Hemoglobin: 13 g/dL (ref 12.0–16.0)
Platelets: 286 K/µL (ref 150–399)
WBC: 7.2 10*3/mL

## 2014-10-26 ENCOUNTER — Encounter: Payer: Self-pay | Admitting: Internal Medicine

## 2014-10-26 ENCOUNTER — Ambulatory Visit (INDEPENDENT_AMBULATORY_CARE_PROVIDER_SITE_OTHER): Payer: Medicare Other | Admitting: Internal Medicine

## 2014-10-26 ENCOUNTER — Telehealth: Payer: Self-pay | Admitting: Internal Medicine

## 2014-10-26 VITALS — BP 132/72 | HR 74 | Temp 98.2°F | Resp 14 | Ht 65.5 in | Wt 189.0 lb

## 2014-10-26 DIAGNOSIS — N183 Chronic kidney disease, stage 3 unspecified: Secondary | ICD-10-CM

## 2014-10-26 DIAGNOSIS — I10 Essential (primary) hypertension: Secondary | ICD-10-CM

## 2014-10-26 DIAGNOSIS — M4806 Spinal stenosis, lumbar region: Secondary | ICD-10-CM | POA: Diagnosis not present

## 2014-10-26 DIAGNOSIS — Z23 Encounter for immunization: Secondary | ICD-10-CM | POA: Diagnosis not present

## 2014-10-26 DIAGNOSIS — E785 Hyperlipidemia, unspecified: Secondary | ICD-10-CM

## 2014-10-26 DIAGNOSIS — M48061 Spinal stenosis, lumbar region without neurogenic claudication: Secondary | ICD-10-CM

## 2014-10-26 NOTE — Patient Instructions (Addendum)
The following protein shakes are low carb AND low calorie   Atkins EAS Carb Control Premier Protein Pure Protein   The following protein bars are also acceptable:  Atkins and Quest protein bars   .All of your labs are normal, except your triglycerides   These should improve with a low glycemic index diet .  We should repeat in 6 months.

## 2014-10-26 NOTE — Telephone Encounter (Signed)
Your  liver enzymes are normal and your kidney function is stable.   your triglycerides are mildly elevated,  which is raising your total cholesterol .  This should improve with a low glycemic index diet and Exercise .  We should repeat in 6 months.    Regards,   Dr. Derrel Nip

## 2014-10-26 NOTE — Telephone Encounter (Signed)
Pt notified during office visit today.

## 2014-10-26 NOTE — Progress Notes (Signed)
Pre visit review using our clinic review tool, if applicable. No additional management support is needed unless otherwise documented below in the visit note. 

## 2014-10-27 ENCOUNTER — Encounter: Payer: Self-pay | Admitting: Internal Medicine

## 2014-10-27 NOTE — Assessment & Plan Note (Signed)
Well controlled on current regimen. Renal function stable, no changes today.  Lab Results  Component Value Date   CREATININE 1.4* 10/22/2014   Lab Results  Component Value Date   NA 139 10/22/2014   K 4.5 10/22/2014

## 2014-10-27 NOTE — Progress Notes (Signed)
Patient ID: Pamela Paul, female   DOB: 01/08/1926, 80 y.o.   MRN: [id number]  Patient Active Problem List   Diagnosis Date Noted  . CKD (chronic kidney disease) stage 3, GFR 30-59 ml/min 04/20/2014  . Dysuria 05/20/2013  . Encounter for preventive health examination 03/17/2013  . Breast screening, unspecified 03/17/2012  . Screening for colon cancer 03/15/2012  . Hyperlipidemia   . Heart disease   . Spinal stenosis of lumbar region   . Hypothyroidism   . Hypertension     Subjective:  CC:   Chief Complaint  Patient presents with  . Follow-up    6 month follow up    HPI:   Pamela Paul is a 80 y.o. female who presents for 6 month follow up on chronic conditions including hypertension, CKd,  Lumbar spinal stenosis, and hypothyroidism.  Feels generally well,  Still swimming 3 times a week,  Her children have given her a walker , which she uses occasionally for long walks instead of her cane, to prevent falls.  She has had no recent falls.    Past Medical History  Diagnosis Date  . Spinal stenosis of lumbar region     s/p surgery  . Hypertension   . Hyperlipidemia   . Hypothyroidism   . Heart disease     mitral valvular    Past Surgical History  Procedure Laterality Date  . Tendon repair      achille heall, left   . Spine surgery  2006    rods, [facility]  . Small intestine surgery  2010    for SBO  . Cholecystectomy  1980  . Joint replacement      Bilateral knee  . Abdominal hysterectomy    . Bilateral cataract surgery    . Replacement total knee bilateral    . Laparoscopic sigmoid colectomy  2011    secondary to benign mass       The following portions of the patient's history were reviewed and updated as appropriate: Allergies, current medications, and problem list.    Review of Systems:   Patient denies headache, fevers, malaise, unintentional weight loss, skin rash, eye pain, sinus congestion and sinus pain, sore throat, dysphagia,  hemoptysis ,  cough, dyspnea, wheezing, chest pain, palpitations, orthopnea, edema, abdominal pain, nausea, melena, diarrhea, constipation, flank pain, dysuria, hematuria, urinary  Frequency, nocturia, numbness, tingling, seizures,  Focal weakness, Loss of consciousness,  Tremor, insomnia, depression, anxiety, and suicidal ideation.     History   Social History  . Marital Status: Married    Spouse Name: N/A    Number of Children: N/A  . Years of Education: N/A   Occupational History  . Not on file.   Social History Main Topics  . Smoking status: Former Smoker    Quit date: 03/15/1961  . Smokeless tobacco: Never Used     Comment: Quit many years ago.  . Alcohol Use: Yes     Comment: occasionally  . Drug Use: No  . Sexual Activity: Not on file   Other Topics Concern  . Not on file   Social History Narrative    Objective:  Filed Vitals:   10/26/14 0805  BP: 132/72  Pulse: 74  Temp: 98.2 F (36.8 C)  Resp: 14     General appearance: alert, cooperative and appears stated age Ears: normal TM's and external ear canals both ears Throat: lips, mucosa, and tongue normal; teeth and gums normal Neck: no adenopathy, no carotid bruit, supple, symmetrical,  trachea midline and thyroid not enlarged, symmetric, no tenderness/mass/nodules Back: symmetric, no curvature. ROM normal. No CVA tenderness. Lungs: clear to auscultation bilaterally Heart: regular rate and rhythm, S1, S2 normal, no murmur, click, rub or gallop Abdomen: soft, non-tender; bowel sounds normal; no masses,  no organomegaly Pulses: 2+ and symmetric Skin: Skin color, texture, turgor normal. No rashes or lesions Lymph nodes: Cervical, supraclavicular, and axillary nodes normal.  Assessment and Plan:  Spinal stenosis of lumbar region With intermittent left leg radiation .  Has had prior lumbar surgery,  No intervention recommended.  occasional  Left leg numbness andd tingling without actual severe pain.  Trial of gabapentin was  not tolerated.     Hyperlipidemia LDL is at goal on statin therapy , but there is persistent elevation of triglycerides .  Advised patient that this  should improve with a low glycemic index diet and Exercise .  She  has no side effects and liver enzymes have been  normal. No changes today.  Lab Results  Component Value Date   CHOL 206* 04/16/2014   HDL 46 04/16/2014   LDLCALC 120 04/16/2014   TRIG 202* 04/16/2014   Lab Results  Component Value Date   ALT 23 04/16/2014   AST 18 04/16/2014   ALKPHOS 43 04/16/2014        Hypertension Well controlled on current regimen. Renal function stable, no changes today.  Lab Results  Component Value Date   CREATININE 1.4* 10/22/2014   Lab Results  Component Value Date   NA 139 10/22/2014   K 4.5 10/22/2014     CKD (chronic kidney disease) stage 3, GFR 30-59 ml/min Discussed today.  priror labs reviewed,  Cr is stable.  Advised to avoid NSAIDs     Updated Medication List Outpatient Encounter Prescriptions as of 10/26/2014  Medication Sig  . aspirin 81 MG tablet Take 160 mg by mouth daily.  . Calcium-Vitamin D (CALTRATE 600 PLUS-VIT D PO) Take by mouth daily  . enalapril (VASOTEC) 20 MG tablet TAKE ONE TABLET BY MOUTH ONCE DAILY  . levothyroxine (SYNTHROID, LEVOTHROID) 50 MCG tablet TAKE ONE TABLET BY MOUTH ONCE DAILY  . Multiple Vitamins-Minerals (PRESERVISION AREDS PO) Take 2 tablets by mouth daily.   . [DISCONTINUED] ciprofloxacin (CIPRO) 500 MG tablet Take 1 tablet twice a day for 7 days.  . [DISCONTINUED] gabapentin (NEURONTIN) 100 MG capsule Take 1 capsule (100 mg total) by mouth at bedtime. May increase weekly to max 3 tablets daily     Orders Placed This Encounter  Procedures  . Pneumococcal conjugate vaccine 13-valent  . CBC and differential  . Basic metabolic panel  . Lipid panel  . Hepatic function panel    Return in about 6 months (around 04/26/2015).

## 2014-10-27 NOTE — Assessment & Plan Note (Addendum)
LDL is at goal on statin therapy , but there is persistent elevation of triglycerides .  Advised patient that this  should improve with a low glycemic index diet and Exercise .  She  has no side effects and liver enzymes have been  normal. No changes today.  Lab Results  Component Value Date   CHOL 206* 04/16/2014   HDL 46 04/16/2014   LDLCALC 120 04/16/2014   TRIG 202* 04/16/2014   Lab Results  Component Value Date   ALT 23 04/16/2014   AST 18 04/16/2014   ALKPHOS 43 04/16/2014

## 2014-10-27 NOTE — Assessment & Plan Note (Signed)
Discussed today.  priror labs reviewed,  Cr is stable.  Advised to avoid NSAIDs

## 2014-10-27 NOTE — Assessment & Plan Note (Signed)
With intermittent left leg radiation .  Has had prior lumbar surgery,  No intervention recommended.  occasional  Left leg numbness andd tingling without actual severe pain.  Trial of gabapentin was not tolerated.

## 2014-11-03 ENCOUNTER — Encounter: Payer: Self-pay | Admitting: Internal Medicine

## 2014-11-03 ENCOUNTER — Other Ambulatory Visit: Payer: Self-pay | Admitting: Internal Medicine

## 2014-12-28 ENCOUNTER — Ambulatory Visit (INDEPENDENT_AMBULATORY_CARE_PROVIDER_SITE_OTHER): Payer: Medicare Other | Admitting: Nurse Practitioner

## 2014-12-28 ENCOUNTER — Encounter: Payer: Self-pay | Admitting: Nurse Practitioner

## 2014-12-28 DIAGNOSIS — R300 Dysuria: Secondary | ICD-10-CM

## 2014-12-28 LAB — POCT URINALYSIS DIPSTICK
Glucose, UA: NEGATIVE
Nitrite, UA: NEGATIVE
Protein, UA: 30
Spec Grav, UA: 1.025
Urobilinogen, UA: 0.2
pH, UA: 5

## 2014-12-28 MED ORDER — SULFAMETHOXAZOLE-TRIMETHOPRIM 800-160 MG PO TABS: 1.0000 | ORAL_TABLET | Freq: Two times a day (BID) | ORAL | Status: DC

## 2014-12-28 NOTE — Assessment & Plan Note (Signed)
Worsening. Small amount of output only enough for POCT urine. 3+ Leukocytes, small amount of blood in urine as well as other findings. Will send rx of Bactrim DS one by mouth twice daily for 5 days. Noted was Kidney issues. They are stable at this time and I asked pt to watch for decreased urine output and back pain that is different from her usual. She has used Bactrim in past with success. Choices were slim. FU prn worsening/failure to improve.

## 2014-12-28 NOTE — Progress Notes (Signed)
Pre visit review using our clinic review tool, if applicable. No additional management support is needed unless otherwise documented below in the visit note. 

## 2014-12-28 NOTE — Patient Instructions (Signed)
Please stay hydrated while taking this antibiotic.   Please take a probiotic ( Align, Floraque or Culturelle) while you are on the antibiotic to prevent a serious antibiotic associated diarrhea  Called clostirudium dificile colitis and a vaginal yeast infection

## 2014-12-28 NOTE — Progress Notes (Signed)
Subjective:    Patient ID: Pamela Paul, female    DOB: December 14, 1925, 81 y.o.   MRN: [id number]  HPI  Pamela Paul is a 81 yo female with a CC of urinary frequency and malodor x 1 day.   1) Symptoms began yesterday, she reports. She was going more frequently, decreased output, strong odor, and cloudy.   Review of Systems  Constitutional: Negative for fever, chills, diaphoresis and fatigue.  Respiratory: Negative for chest tightness, shortness of breath and wheezing.   Cardiovascular: Negative for chest pain, palpitations and leg swelling.  Gastrointestinal: Negative for nausea, vomiting and diarrhea.  Genitourinary: Positive for dysuria, frequency and decreased urine volume. Negative for urgency and hematuria.   Past Medical History  Diagnosis Date  . Spinal stenosis of lumbar region     s/p surgery  . Hypertension   . Hyperlipidemia   . Hypothyroidism   . Heart disease     mitral valvular    History   Social History  . Marital Status: Married    Spouse Name: N/A    Number of Children: N/A  . Years of Education: N/A   Occupational History  . Not on file.   Social History Main Topics  . Smoking status: Former Smoker    Quit date: 03/15/1961  . Smokeless tobacco: Never Used     Comment: Quit many years ago.  . Alcohol Use: Yes     Comment: occasionally  . Drug Use: No  . Sexual Activity: Not on file   Other Topics Concern  . Not on file   Social History Narrative    Past Surgical History  Procedure Laterality Date  . Tendon repair      achille heall, left   . Spine surgery  2006    rods, [facility]  . Small intestine surgery  2010    for SBO  . Cholecystectomy  1980  . Joint replacement      Bilateral knee  . Abdominal hysterectomy    . Bilateral cataract surgery    . Replacement total knee bilateral    . Laparoscopic sigmoid colectomy  2011    secondary to benign mass    Family History  Problem Relation Age of Onset  . Arthritis Mother   .  Stroke Mother   . Hyperlipidemia Father   . Cancer Father   . Heart disease Paternal Aunt     No Known Allergies  Current Outpatient Prescriptions on File Prior to Visit  Medication Sig Dispense Refill  . aspirin 81 MG tablet Take 160 mg by mouth daily.    . Calcium-Vitamin D (CALTRATE 600 PLUS-VIT D PO) Take by mouth daily    . enalapril (VASOTEC) 20 MG tablet TAKE ONE TABLET BY MOUTH ONCE DAILY 90 tablet 1  . levothyroxine (SYNTHROID, LEVOTHROID) 50 MCG tablet TAKE ONE TABLET BY MOUTH ONCE DAILY 90 tablet 3  . Multiple Vitamins-Minerals (PRESERVISION AREDS PO) Take 2 tablets by mouth daily.      No current facility-administered medications on file prior to visit.       Objective:   Physical Exam  Constitutional: She is oriented to person, place, and time. She appears well-developed and well-nourished. No distress.  BP 122/62 mmHg  Pulse 70  Temp(Src) 97.5 F (36.4 C) (Oral)  Resp 12  Ht 5' 5.5" (1.664 m)  Wt 190 lb 12.8 oz (86.546 kg)  BMI 31.26 kg/m2  SpO2 97%   Cardiovascular: Normal rate and regular rhythm.   Pulmonary/Chest:  Effort normal and breath sounds normal.  Abdominal: There is no CVA tenderness.  Neurological: She is alert and oriented to person, place, and time.  Skin: Skin is warm and dry. No rash noted. She is not diaphoretic.  Psychiatric: She has a normal mood and affect. Her behavior is normal. Judgment and thought content normal.      Assessment & Plan:

## 2015-02-18 DIAGNOSIS — H3531 Nonexudative age-related macular degeneration: Secondary | ICD-10-CM | POA: Diagnosis not present

## 2015-04-26 ENCOUNTER — Ambulatory Visit (INDEPENDENT_AMBULATORY_CARE_PROVIDER_SITE_OTHER): Payer: Medicare Other | Admitting: Internal Medicine

## 2015-04-26 ENCOUNTER — Encounter: Payer: Self-pay | Admitting: Internal Medicine

## 2015-04-26 VITALS — BP 120/70 | HR 67 | Temp 97.6°F | Resp 14 | Ht 66.0 in | Wt 189.5 lb

## 2015-04-26 DIAGNOSIS — Z1159 Encounter for screening for other viral diseases: Secondary | ICD-10-CM

## 2015-04-26 DIAGNOSIS — E038 Other specified hypothyroidism: Secondary | ICD-10-CM | POA: Diagnosis not present

## 2015-04-26 DIAGNOSIS — M542 Cervicalgia: Secondary | ICD-10-CM | POA: Diagnosis not present

## 2015-04-26 DIAGNOSIS — Z79899 Other long term (current) drug therapy: Secondary | ICD-10-CM

## 2015-04-26 DIAGNOSIS — E559 Vitamin D deficiency, unspecified: Secondary | ICD-10-CM

## 2015-04-26 DIAGNOSIS — I10 Essential (primary) hypertension: Secondary | ICD-10-CM

## 2015-04-26 DIAGNOSIS — Z1239 Encounter for other screening for malignant neoplasm of breast: Secondary | ICD-10-CM

## 2015-04-26 DIAGNOSIS — M48061 Spinal stenosis, lumbar region without neurogenic claudication: Secondary | ICD-10-CM

## 2015-04-26 DIAGNOSIS — E785 Hyperlipidemia, unspecified: Secondary | ICD-10-CM

## 2015-04-26 DIAGNOSIS — M4806 Spinal stenosis, lumbar region: Secondary | ICD-10-CM

## 2015-04-26 DIAGNOSIS — Z13820 Encounter for screening for osteoporosis: Secondary | ICD-10-CM

## 2015-04-26 DIAGNOSIS — Z0000 Encounter for general adult medical examination without abnormal findings: Secondary | ICD-10-CM

## 2015-04-26 DIAGNOSIS — E034 Atrophy of thyroid (acquired): Secondary | ICD-10-CM | POA: Diagnosis not present

## 2015-04-26 LAB — COMPREHENSIVE METABOLIC PANEL
ALT: 18 U/L (ref 0–35)
AST: 23 U/L (ref 0–37)
Albumin: 4.6 g/dL (ref 3.5–5.2)
Alkaline Phosphatase: 40 U/L (ref 39–117)
BUN: 25 mg/dL — ABNORMAL HIGH (ref 6–23)
CO2: 27 mEq/L (ref 19–32)
Calcium: 10 mg/dL (ref 8.4–10.5)
Chloride: 101 mEq/L (ref 96–112)
Creatinine, Ser: 1.51 mg/dL — ABNORMAL HIGH (ref 0.40–1.20)
GFR: 34.64 mL/min — ABNORMAL LOW (ref >60.00)
Glucose, Bld: 98 mg/dL (ref 70–99)
Potassium: 4.4 mEq/L (ref 3.5–5.1)
Sodium: 135 mEq/L (ref 135–145)
Total Bilirubin: 0.7 mg/dL (ref 0.2–1.2)
Total Protein: 7.9 g/dL (ref 6.0–8.3)

## 2015-04-26 LAB — CBC WITH DIFFERENTIAL/PLATELET
Basophils Absolute: 0.1 10*3/uL (ref 0.0–0.1)
Basophils Relative: 0.8 % (ref 0.0–3.0)
Eosinophils Absolute: 0.2 10*3/uL (ref 0.0–0.7)
Eosinophils Relative: 2.4 % (ref 0.0–5.0)
HCT: 38 % (ref 36.0–46.0)
Hemoglobin: 13 g/dL (ref 12.0–15.0)
Lymphocytes Relative: 34.8 % (ref 12.0–46.0)
Lymphs Abs: 2.4 10*3/uL (ref 0.7–4.0)
MCHC: 34.2 g/dL (ref 30.0–36.0)
MCV: 94 fl (ref 78.0–100.0)
Monocytes Absolute: 0.4 10*3/uL (ref 0.1–1.0)
Monocytes Relative: 6.2 % (ref 3.0–12.0)
Neutro Abs: 3.9 10*3/uL (ref 1.4–7.7)
Neutrophils Relative %: 55.8 % (ref 43.0–77.0)
Platelets: 306 10*3/uL (ref 150.0–400.0)
RBC: 4.04 Mil/uL (ref 3.87–5.11)
RDW: 13 % (ref 11.5–15.5)
WBC: 7 10*3/uL (ref 4.0–10.5)

## 2015-04-26 LAB — LIPID PANEL
Cholesterol: 216 mg/dL — ABNORMAL HIGH (ref 0–200)
HDL: 50.4 mg/dL (ref >39.00)
NonHDL: 165.6
Total CHOL/HDL Ratio: 4
Triglycerides: 302 mg/dL — ABNORMAL HIGH (ref 0.0–149.0)
VLDL: 60.4 mg/dL — ABNORMAL HIGH (ref 0.0–40.0)

## 2015-04-26 LAB — TSH: TSH: 2.13 u[IU]/mL (ref 0.35–4.50)

## 2015-04-26 LAB — LDL CHOLESTEROL, DIRECT: Direct LDL: 126 mg/dL

## 2015-04-26 LAB — VITAMIN D 25 HYDROXY (VIT D DEFICIENCY, FRACTURES): VITD: 34.27 ng/mL (ref 30.00–100.00)

## 2015-04-26 MED ORDER — ENALAPRIL MALEATE 20 MG PO TABS: 20.0000 mg | ORAL_TABLET | Freq: Every day | ORAL | Status: DC

## 2015-04-26 NOTE — Assessment & Plan Note (Signed)
With intermittent left leg radiation .  Has had prior lumbar surgery,  No intervention recommended.  occasional  Left leg numbness andd tingling without actual severe pain.  Trial of gabapentin was not tolerated.

## 2015-04-26 NOTE — Assessment & Plan Note (Signed)
LDL ihas been at  goal on statin therapy , and elevation of triglycerides was addressed with low glycemic index diet and Exercise .  She  has no side effects and liver enzymes have been  normal.   Lab Results  Component Value Date   CHOL 207* 10/22/2014   HDL 47 10/22/2014   LDLCALC 104 10/22/2014   TRIG 281* 10/22/2014   Lab Results  Component Value Date   ALT 18 10/22/2014   AST 23 10/22/2014   ALKPHOS 42 10/22/2014

## 2015-04-26 NOTE — Assessment & Plan Note (Signed)

## 2015-04-26 NOTE — Assessment & Plan Note (Addendum)
Well controlled on current regimen. Renal function has been stable and will be repeated  today   Lab Results  Component Value Date   CREATININE 1.4* 10/22/2014   Lab Results  Component Value Date   NA 139 10/22/2014   K 4.5 10/22/2014

## 2015-04-26 NOTE — Patient Instructions (Signed)

## 2015-04-26 NOTE — Progress Notes (Signed)
Pre-visit discussion using our clinic review tool. No additional management support is needed unless otherwise documented below in the visit note.  

## 2015-04-26 NOTE — Assessment & Plan Note (Signed)
Last thyroid check was one year ago and she has of symptoms or over or underactive thyroid a this time.  Repeat TSH is pending   Lab Results  Component Value Date   TSH 2.90 04/16/2014

## 2015-04-26 NOTE — Progress Notes (Signed)
Patient ID: Pamela Paul, female    DOB: 1926-10-27  Age: 81 y.o. MRN: [id number]  The patient is here for annual Medicare wellness examination and management of other chronic and acute problems.   The risk factors are reflected in the social history.  The roster of all physicians providing medical care to patient - is listed in the Snapshot section of the chart.  Activities of daily living:  The patient is 100% independent in all ADLs: dressing, toileting, feeding as well as independent mobility.  usig a walker now with in the home and out, had not had any falls, but the left leg has never really fully recovered after the spine surgery by Dr Pamela Paul and [facility].   Has been having right sided neck pain that is severe and occurs with rest,  SCM    distrivution and right shoulder pain radiating wrist. Home safety : The patient has smoke detectors in the home. They wear seatbelts.  There are no firearms at home. There is no violence in the home.   There is no risks for hepatitis, STDs or HIV. There is no   history of blood transfusion. They have no travel history to infectious disease endemic areas of the world.  The patient has seen their dentist in the last six month. They have seen their eye doctor in the last year. They admit to slight hearing difficulty with regard to whispered voices and some television programs.  They have deferred audiologic testing in the last year.  They do not  have excessive sun exposure. Discussed the need for sun protection: hats, long sleeves and use of sunscreen if there is significant sun exposure.   Diet: the importance of a healthy diet is discussed. They do have a healthy diet.  The benefits of regular aerobic exercise were discussed. She walks 4 times per week ,  20 minutes.   Depression screen: there are no signs or vegative symptoms of depression- irritability, change in appetite, anhedonia, sadness/tearfullness.  Cognitive assessment: the patient manages all  their financial and personal affairs and is actively engaged. They could relate day,date,year and events; recalled 2/3 objects at 3 minutes; performed clock-face test normally.  The following portions of the patient's history were reviewed and updated as appropriate: allergies, current medications, past family history, past medical history,  past surgical history, past social history  and problem list.  Visual acuity was not assessed per patient preference since she has regular follow up with her ophthalmologist. Hearing and body mass index were assessed and reviewed.   During the course of the visit the patient was educated and counseled about appropriate screening and preventive services including : fall prevention , diabetes screening, nutrition counseling, colorectal cancer screening, and recommended immunizations.    CC: The primary encounter diagnosis was Cervicalgia. Diagnoses of Screening for osteoporosis, Breast cancer screening, Need for hepatitis C screening test, Hyperlipidemia, Hypothyroidism due to acquired atrophy of thyroid, Vitamin D deficiency, Long-term use of high-risk medication, Essential hypertension, Medicare annual wellness visit, subsequent, and Spinal stenosis of lumbar region were also pertinent to this visit.  History Pamela Paul has a past medical history of Spinal stenosis of lumbar region; Hypertension; Hyperlipidemia; Hypothyroidism; and Heart disease.   She has past surgical history that includes tendon repair; Spine surgery (2006); Small intestine surgery (2010); Cholecystectomy (1980); Joint replacement; Abdominal hysterectomy; bilateral cataract surgery; Replacement total knee bilateral; and Laparoscopic sigmoid colectomy (2011).   Her family history includes Arthritis in her mother; Cancer in her father; Heart  disease in her paternal aunt; Hyperlipidemia in her father; Stroke in her mother.She reports that she quit smoking about 54 years ago. She has never used  smokeless tobacco. She reports that she drinks alcohol. She reports that she does not use illicit drugs.  Outpatient Prescriptions Prior to Visit  Medication Sig Dispense Refill  . aspirin 81 MG tablet Take 160 mg by mouth daily.    . Calcium-Vitamin D (CALTRATE 600 PLUS-VIT D PO) Take by mouth daily    . levothyroxine (SYNTHROID, LEVOTHROID) 50 MCG tablet TAKE ONE TABLET BY MOUTH ONCE DAILY 90 tablet 3  . Multiple Vitamins-Minerals (PRESERVISION AREDS PO) Take 2 tablets by mouth daily.     Marland Kitchen sulfamethoxazole-trimethoprim (BACTRIM DS,SEPTRA DS) 800-160 MG per tablet Take 1 tablet by mouth 2 (two) times daily. (Patient not taking: Reported on 04/26/2015) 10 tablet 0  . enalapril (VASOTEC) 20 MG tablet TAKE ONE TABLET BY MOUTH ONCE DAILY 90 tablet 1   No facility-administered medications prior to visit.    Review of Systems   Patient denies headache, fevers, malaise, unintentional weight loss, skin rash, eye pain, sinus congestion and sinus pain, sore throat, dysphagia,  hemoptysis , cough, dyspnea, wheezing, chest pain, palpitations, orthopnea, edema, abdominal pain, nausea, melena, diarrhea, constipation, flank pain, dysuria, hematuria, urinary  Frequency, nocturia, numbness, tingling, seizures,  Focal weakness, Loss of consciousness,  Tremor, insomnia, depression, anxiety, and suicidal ideation.      Objective:  BP 120/70 mmHg  Pulse 67  Temp(Src) 97.6 F (36.4 C) (Oral)  Resp 14  Ht 5\' 6"  (1.676 m)  Wt 189 lb 8 oz (85.957 kg)  BMI 30.60 kg/m2  SpO2 96%  Physical Exam  General appearance: alert, cooperative and appears stated age Head: Normocephalic, without obvious abnormality, atraumatic Eyes: conjunctivae/corneas clear. PERRL, EOM's intact. Fundi benign. Ears: normal TM's and external ear canals both ears Nose: Nares normal. Septum midline. Mucosa normal. No drainage or sinus tenderness. Throat: lips, mucosa, and tongue normal; teeth and gums normal Neck: no adenopathy, no  carotid bruit, no JVD, supple, symmetrical, trachea midline and thyroid not enlarged, symmetric, no tenderness/mass/nodules Lungs: clear to auscultation bilaterally Breasts: normal appearance, no masses or tenderness Heart: regular rate and rhythm, S1, S2 normal, no murmur, click, rub or gallop Abdomen: soft, non-tender; bowel sounds normal; no masses,  no organomegaly Extremities: extremities normal, atraumatic, no cyanosis or edema Pulses: 2+ and symmetric Skin: Skin color, texture, turgor normal. No rashes or lesions Neurologic: Alert and oriented X 3, normal strength and tone. Normal symmetric reflexes. Normal coordination and gait.   Assessment & Plan:   Problem List Items Addressed This Visit    Spinal stenosis of lumbar region    With intermittent left leg radiation .  Has had prior lumbar surgery,  No intervention recommended.  occasional  Left leg numbness andd tingling without actual severe pain.  Trial of gabapentin was not tolerated.           Hypothyroidism    Last thyroid check was one year ago and she has of symptoms or over or underactive thyroid a this time.  Repeat TSH is pending   Lab Results  Component Value Date   TSH 2.90 04/16/2014         Relevant Orders   TSH   Hypertension    Well controlled on current regimen. Renal function has been stable and will be repeated  today   Lab Results  Component Value Date   CREATININE 1.4* 10/22/2014   Lab  Results  Component Value Date   NA 139 10/22/2014   K 4.5 10/22/2014         Relevant Medications   enalapril (VASOTEC) 20 MG tablet   Hyperlipidemia    LDL ihas been at  goal on statin therapy , and elevation of triglycerides was addressed with low glycemic index diet and Exercise .  She  has no side effects and liver enzymes have been  normal.   Lab Results  Component Value Date   CHOL 207* 10/22/2014   HDL 47 10/22/2014   LDLCALC 104 10/22/2014   TRIG 281* 10/22/2014   Lab Results  Component  Value Date   ALT 18 10/22/2014   AST 23 10/22/2014   ALKPHOS 42 10/22/2014              Relevant Medications   enalapril (VASOTEC) 20 MG tablet   Other Relevant Orders   Lipid panel   Medicare annual wellness visit, subsequent    Annual Medicare wellness  exam was done as well as a comprehensive physical exam and management of acute and chronic conditions .  During the course of the visit the patient was educated and counseled about appropriate screening and preventive services including : fall prevention , diabetes screening, nutrition counseling, colorectal cancer screening, and recommended immunizations.  Printed recommendations for health maintenance screenings was given.        Other Visit Diagnoses    Cervicalgia    -  Primary    Relevant Orders    DG Cervical Spine Complete    Screening for osteoporosis        Relevant Orders    DG Bone Density    Breast cancer screening        Relevant Orders    MM DIGITAL SCREENING BILATERAL    Need for hepatitis C screening test        Relevant Orders    Hepatitis C antibody    Vitamin D deficiency        Relevant Orders    Vit D  25 hydroxy (rtn osteoporosis monitoring)    Long-term use of high-risk medication        Relevant Orders    CBC with Differential/Platelet    Comprehensive metabolic panel       I have changed Ms. Herrington's enalapril. I am also having her maintain her aspirin, Calcium-Vitamin D (CALTRATE 600 PLUS-VIT D PO), Multiple Vitamins-Minerals (PRESERVISION AREDS PO), levothyroxine, and sulfamethoxazole-trimethoprim.  Meds ordered this encounter  Medications  . enalapril (VASOTEC) 20 MG tablet    Sig: Take 1 tablet (20 mg total) by mouth daily.    Dispense:  90 tablet    Refill:  1    Medications Discontinued During This Encounter  Medication Reason  . enalapril (VASOTEC) 20 MG tablet Reorder    Follow-up: Return in about 6 months (around 10/27/2015).   [name], MD

## 2015-04-27 LAB — HEPATITIS C ANTIBODY: HCV Ab: NEGATIVE

## 2015-05-01 ENCOUNTER — Other Ambulatory Visit: Payer: Self-pay | Admitting: Internal Medicine

## 2015-05-03 ENCOUNTER — Encounter: Payer: Self-pay | Admitting: Internal Medicine

## 2015-05-03 DIAGNOSIS — R944 Abnormal results of kidney function studies: Secondary | ICD-10-CM

## 2015-05-20 DIAGNOSIS — Z85828 Personal history of other malignant neoplasm of skin: Secondary | ICD-10-CM | POA: Diagnosis not present

## 2015-05-20 DIAGNOSIS — X32XXXA Exposure to sunlight, initial encounter: Secondary | ICD-10-CM | POA: Diagnosis not present

## 2015-05-20 DIAGNOSIS — L570 Actinic keratosis: Secondary | ICD-10-CM | POA: Diagnosis not present

## 2015-05-21 NOTE — Telephone Encounter (Signed)
Called pt about the unread message about the decline of her kidney function. She stated that she was willing to see a Nephrologist about her kidneys. Needs referral placed.

## 2015-05-23 NOTE — Telephone Encounter (Signed)
Referral is in process as requested 

## 2015-06-10 DIAGNOSIS — I10 Essential (primary) hypertension: Secondary | ICD-10-CM | POA: Diagnosis not present

## 2015-06-10 DIAGNOSIS — N183 Chronic kidney disease, stage 3 (moderate): Secondary | ICD-10-CM | POA: Diagnosis not present

## 2015-06-10 DIAGNOSIS — N2581 Secondary hyperparathyroidism of renal origin: Secondary | ICD-10-CM | POA: Diagnosis not present

## 2015-06-16 DIAGNOSIS — N183 Chronic kidney disease, stage 3 (moderate): Secondary | ICD-10-CM | POA: Diagnosis not present

## 2015-06-21 DIAGNOSIS — N2889 Other specified disorders of kidney and ureter: Secondary | ICD-10-CM | POA: Diagnosis not present

## 2015-06-21 DIAGNOSIS — I10 Essential (primary) hypertension: Secondary | ICD-10-CM | POA: Diagnosis not present

## 2015-06-21 DIAGNOSIS — N183 Chronic kidney disease, stage 3 (moderate): Secondary | ICD-10-CM | POA: Diagnosis not present

## 2015-06-23 ENCOUNTER — Other Ambulatory Visit: Payer: Self-pay | Admitting: Nephrology

## 2015-06-23 DIAGNOSIS — N2889 Other specified disorders of kidney and ureter: Secondary | ICD-10-CM

## 2015-06-28 ENCOUNTER — Telehealth: Payer: Self-pay | Admitting: Internal Medicine

## 2015-06-28 DIAGNOSIS — N281 Cyst of kidney, acquired: Secondary | ICD-10-CM | POA: Insufficient documentation

## 2015-06-28 DIAGNOSIS — N390 Urinary tract infection, site not specified: Secondary | ICD-10-CM | POA: Insufficient documentation

## 2015-06-28 DIAGNOSIS — N2889 Other specified disorders of kidney and ureter: Secondary | ICD-10-CM | POA: Insufficient documentation

## 2015-06-30 ENCOUNTER — Ambulatory Visit
Admission: RE | Admit: 2015-06-30 | Discharge: 2015-06-30 | Disposition: A | Discharge status: Home / Self Care | Payer: Medicare Other | Source: Ambulatory Visit | Attending: Nephrology | Admitting: Nephrology

## 2015-06-30 DIAGNOSIS — N289 Disorder of kidney and ureter, unspecified: Secondary | ICD-10-CM | POA: Diagnosis not present

## 2015-06-30 DIAGNOSIS — N281 Cyst of kidney, acquired: Secondary | ICD-10-CM | POA: Diagnosis not present

## 2015-06-30 DIAGNOSIS — N2889 Other specified disorders of kidney and ureter: Secondary | ICD-10-CM | POA: Diagnosis present

## 2015-07-04 NOTE — Telephone Encounter (Signed)
updated

## 2015-07-04 NOTE — Telephone Encounter (Signed)
closed

## 2015-07-05 ENCOUNTER — Telehealth: Payer: Self-pay | Admitting: Internal Medicine

## 2015-07-05 NOTE — Telephone Encounter (Signed)
Patient called for results on CT ordered by Dr. Daralene Milch, before nurse could call patient Dr. Daralene Milch office had called patient.

## 2015-07-09 DIAGNOSIS — I10 Essential (primary) hypertension: Secondary | ICD-10-CM | POA: Diagnosis not present

## 2015-07-09 DIAGNOSIS — N183 Chronic kidney disease, stage 3 (moderate): Secondary | ICD-10-CM | POA: Diagnosis not present

## 2015-07-26 DIAGNOSIS — I10 Essential (primary) hypertension: Secondary | ICD-10-CM | POA: Diagnosis not present

## 2015-07-26 DIAGNOSIS — N2889 Other specified disorders of kidney and ureter: Secondary | ICD-10-CM | POA: Diagnosis not present

## 2015-07-26 DIAGNOSIS — N183 Chronic kidney disease, stage 3 (moderate): Secondary | ICD-10-CM | POA: Diagnosis not present

## 2015-07-28 ENCOUNTER — Ambulatory Visit (INDEPENDENT_AMBULATORY_CARE_PROVIDER_SITE_OTHER): Payer: Medicare Other | Admitting: Internal Medicine

## 2015-07-28 ENCOUNTER — Encounter: Payer: Self-pay | Admitting: Internal Medicine

## 2015-07-28 VITALS — BP 126/68 | HR 63 | Temp 97.5°F | Resp 14 | Ht 66.0 in | Wt 188.5 lb

## 2015-07-28 DIAGNOSIS — Z7189 Other specified counseling: Secondary | ICD-10-CM | POA: Diagnosis not present

## 2015-07-28 NOTE — Progress Notes (Signed)
At her last visit she was given a   Subjective:  Patient ID: Pamela Paul, female    DOB: 1926-05-13  Age: 81 y.o. MRN: [id number]  CC: The encounter diagnosis was Advanced directives, counseling/discussion.  HPI Pamela Paul presents for an advance Directives discussion .  At her last visit she was given a MOST FORM  To review an d returns today to discuss the options listed on the form.   She denies depression , severe pain, and cognitive difficulties .  She considers herself to have a good quality of life currently and is independent in performing all ADLS.    Outpatient Prescriptions Prior to Visit  Medication Sig Dispense Refill  . aspirin 81 MG tablet Take 160 mg by mouth daily.    . Calcium-Vitamin D (CALTRATE 600 PLUS-VIT D PO) Take by mouth daily    . enalapril (VASOTEC) 20 MG tablet Take 1 tablet (20 mg total) by mouth daily. 90 tablet 1  . levothyroxine (SYNTHROID, LEVOTHROID) 50 MCG tablet TAKE ONE TABLET BY MOUTH ONCE DAILY 90 tablet 3  . Multiple Vitamins-Minerals (PRESERVISION AREDS PO) Take 2 tablets by mouth daily.     Marland Kitchen sulfamethoxazole-trimethoprim (BACTRIM DS,SEPTRA DS) 800-160 MG per tablet Take 1 tablet by mouth 2 (two) times daily. (Patient not taking: Reported on 04/26/2015) 10 tablet 0   No facility-administered medications prior to visit.    Review of Systems;  Patient denies headache, fevers, malaise, unintentional weight loss, skin rash, eye pain, sinus congestion and sinus pain, sore throat, dysphagia,  hemoptysis , cough, dyspnea, wheezing, chest pain, palpitations, orthopnea, edema, abdominal pain, nausea, melena, diarrhea, constipation, flank pain, dysuria, hematuria, urinary  Frequency, nocturia, numbness, tingling, seizures,  Focal weakness, Loss of consciousness,  Tremor, insomnia, depression, anxiety, and suicidal ideation.      Objective:  BP 126/68 mmHg  Pulse 63  Temp(Src) 97.5 F (36.4 C) (Oral)  Resp 14  Ht 5\' 6"  (1.676 m)  Wt 188 lb 8 oz  (85.503 kg)  BMI 30.44 kg/m2  SpO2 97%  BP Readings from Last 3 Encounters:  07/28/15 126/68  04/26/15 120/70  12/28/14 122/62    Wt Readings from Last 3 Encounters:  07/28/15 188 lb 8 oz (85.503 kg)  04/26/15 189 lb 8 oz (85.957 kg)  12/28/14 190 lb 12.8 oz (86.546 kg)    General appearance: alert, cooperative and appears stated age Ears: normal TM's and external ear canals both ears Throat: lips, mucosa, and tongue normal; teeth and gums normal Neck: no adenopathy, no carotid bruit, supple, symmetrical, trachea midline and thyroid not enlarged, symmetric, no tenderness/mass/nodules Back: symmetric, no curvature. ROM normal. No CVA tenderness. Lungs: clear to auscultation bilaterally Heart: regular rate and rhythm, S1, S2 normal, no murmur, click, rub or gallop Abdomen: soft, non-tender; bowel sounds normal; no masses,  no organomegaly Pulses: 2+ and symmetric Skin: Skin color, texture, turgor normal. No rashes or lesions Lymph nodes: Cervical, supraclavicular, and axillary nodes normal.  No results found for: HGBA1C  Lab Results  Component Value Date   CREATININE 1.51* 04/26/2015   CREATININE 1.4* 10/22/2014   CREATININE 1.4* 04/16/2014    Lab Results  Component Value Date   WBC 7.0 04/26/2015   HGB 13.0 04/26/2015   HCT 38.0 04/26/2015   PLT 306.0 04/26/2015   GLUCOSE 98 04/26/2015   CHOL 216* 04/26/2015   TRIG 302.0* 04/26/2015   HDL 50.40 04/26/2015   LDLDIRECT 126.0 04/26/2015   LDLCALC 104 10/22/2014   ALT 18 04/26/2015  AST 23 04/26/2015   NA 135 04/26/2015   K 4.4 04/26/2015   CL 101 04/26/2015   CREATININE 1.51* 04/26/2015   BUN 25* 04/26/2015   CO2 27 04/26/2015   TSH 2.13 04/26/2015    Ct Abdomen Wo Contrast  06/30/2015   CLINICAL DATA:  Recent abnormal ultrasound with left renal mass  EXAM: CT ABDOMEN WITHOUT CONTRAST  TECHNIQUE: Multidetector CT imaging of the abdomen was performed following the standard protocol without IV contrast.   COMPARISON:  05/16/2009, by report from recent ultrasound dated 06/16/2015  FINDINGS: Lung bases are well aerated with mild scarring in the lower lobe stable from the prior exam.  The gallbladder has been surgically removed. The liver, spleen, adrenal glands and pancreas are within normal limits.  Kidneys are well visualized and demonstrate a few tiny nonobstructing renal stones on the right. No obstructive changes are noted. In the upper pole of the left kidney laterally there is an exophytic hypodense lesion measuring 4.3 by 3.3 cm in greatest dimension. It demonstrates some peripheral calcification and relatively fluid attenuation. No definitive solid component is noted. When compare with the prior CT examination from 2010 this lesion was present but measured only 3.5 cm in greatest dimension.  Fecal material is noted within the colon. No focal mass lesion is seen. Aortoiliac calcifications are noted without aneurysmal dilatation. Degenerative and postoperative changes of the lumbar spine are seen.  IMPRESSION: Upper pole left renal cyst with slight increase in size from 2010 as described. This corresponds to the abnormality seen on recent ultrasound.  No other acute abnormality is noted.   Electronically Signed   By: Inez Catalina M.D.   On: 06/30/2015 09:50    Assessment & Plan:   Problem List Items Addressed This Visit      Unprioritized   Advanced directives, counseling/discussion - Primary    A total of 25 minutes of face to face time was spent with patient more than half of which was spent in discussing the orders listed on the MOST form and choosing those which reflect her end of life goals. She is competent to manage these decisions.  Patient continues to request DNR status and the MOST  Form was completed.and copied into chart .      Relevant Orders   DNR (Do Not Resuscitate)      I have discontinued Ms. Llera's sulfamethoxazole-trimethoprim. I am also having her maintain her aspirin,  Calcium-Vitamin D (CALTRATE 600 PLUS-VIT D PO), Multiple Vitamins-Minerals (PRESERVISION AREDS PO), enalapril, and levothyroxine.  No orders of the defined types were placed in this encounter.    Medications Discontinued During This Encounter  Medication Reason  . sulfamethoxazole-trimethoprim (BACTRIM DS,SEPTRA DS) 800-160 MG per tablet Completed Course    Follow-up: Return in about 6 months (around 01/28/2016).   [name], MD

## 2015-07-28 NOTE — Progress Notes (Signed)
Pre-visit discussion using our clinic review tool. No additional management support is needed unless otherwise documented below in the visit note.  

## 2015-07-28 NOTE — Patient Instructions (Addendum)
I Agree with stopping your calcium supplements  You cna get plenty throught diet:  Yogurt, cheese Broccoli and bok choy Almond Milk  Or cashew milk

## 2015-07-30 DIAGNOSIS — Z7189 Other specified counseling: Secondary | ICD-10-CM | POA: Insufficient documentation

## 2015-07-30 NOTE — Assessment & Plan Note (Signed)
A total of 25 minutes of face to face time was spent with patient more than half of which was spent in discussing the orders listed on the MOST form and choosing those which reflect her end of life goals. She is competent to manage these decisions.  Patient continues to request DNR status and the MOST  Form was completed.and copied into chart .

## 2015-08-24 DIAGNOSIS — H31113 Age-related choroidal atrophy, bilateral: Secondary | ICD-10-CM | POA: Diagnosis not present

## 2015-08-24 DIAGNOSIS — H43813 Vitreous degeneration, bilateral: Secondary | ICD-10-CM | POA: Diagnosis not present

## 2015-08-24 DIAGNOSIS — H3531 Nonexudative age-related macular degeneration: Secondary | ICD-10-CM | POA: Diagnosis not present

## 2015-08-24 DIAGNOSIS — H26493 Other secondary cataract, bilateral: Secondary | ICD-10-CM | POA: Diagnosis not present

## 2015-10-14 DIAGNOSIS — Z23 Encounter for immunization: Secondary | ICD-10-CM | POA: Diagnosis not present

## 2015-11-08 ENCOUNTER — Other Ambulatory Visit: Payer: Self-pay | Admitting: Internal Medicine

## 2016-01-28 ENCOUNTER — Ambulatory Visit (INDEPENDENT_AMBULATORY_CARE_PROVIDER_SITE_OTHER): Payer: Medicare Other | Admitting: Internal Medicine

## 2016-01-28 ENCOUNTER — Encounter: Payer: Self-pay | Admitting: Internal Medicine

## 2016-01-28 VITALS — BP 104/60 | HR 62 | Temp 97.6°F | Resp 12 | Ht 66.0 in | Wt 189.0 lb

## 2016-01-28 DIAGNOSIS — E034 Atrophy of thyroid (acquired): Secondary | ICD-10-CM

## 2016-01-28 DIAGNOSIS — N183 Chronic kidney disease, stage 3 unspecified: Secondary | ICD-10-CM

## 2016-01-28 DIAGNOSIS — Z23 Encounter for immunization: Secondary | ICD-10-CM

## 2016-01-28 DIAGNOSIS — E038 Other specified hypothyroidism: Secondary | ICD-10-CM | POA: Diagnosis not present

## 2016-01-28 DIAGNOSIS — I10 Essential (primary) hypertension: Secondary | ICD-10-CM

## 2016-01-28 DIAGNOSIS — I519 Heart disease, unspecified: Secondary | ICD-10-CM

## 2016-01-28 DIAGNOSIS — E785 Hyperlipidemia, unspecified: Secondary | ICD-10-CM

## 2016-01-28 LAB — LIPID PANEL
Cholesterol: 208 mg/dL — ABNORMAL HIGH (ref 0–200)
HDL: 48 mg/dL (ref >39.00)
NonHDL: 159.59
Total CHOL/HDL Ratio: 4
Triglycerides: 212 mg/dL — ABNORMAL HIGH (ref 0.0–149.0)
VLDL: 42.4 mg/dL — ABNORMAL HIGH (ref 0.0–40.0)

## 2016-01-28 LAB — CBC WITH DIFFERENTIAL/PLATELET
Basophils Absolute: 0.1 10*3/uL (ref 0.0–0.1)
Basophils Relative: 1 % (ref 0.0–3.0)
Eosinophils Absolute: 0.2 10*3/uL (ref 0.0–0.7)
Eosinophils Relative: 3.7 % (ref 0.0–5.0)
HCT: 36.7 % (ref 36.0–46.0)
Hemoglobin: 12.4 g/dL (ref 12.0–15.0)
Lymphocytes Relative: 28.6 % (ref 12.0–46.0)
Lymphs Abs: 1.7 10*3/uL (ref 0.7–4.0)
MCHC: 33.9 g/dL (ref 30.0–36.0)
MCV: 94.7 fl (ref 78.0–100.0)
Monocytes Absolute: 0.4 10*3/uL (ref 0.1–1.0)
Monocytes Relative: 6.2 % (ref 3.0–12.0)
Neutro Abs: 3.7 10*3/uL (ref 1.4–7.7)
Neutrophils Relative %: 60.5 % (ref 43.0–77.0)
Platelets: 320 10*3/uL (ref 150.0–400.0)
RBC: 3.87 Mil/uL (ref 3.87–5.11)
RDW: 12.4 % (ref 11.5–15.5)
WBC: 6.1 10*3/uL (ref 4.0–10.5)

## 2016-01-28 LAB — COMPREHENSIVE METABOLIC PANEL
ALT: 16 U/L (ref 0–35)
AST: 22 U/L (ref 0–37)
Albumin: 4.4 g/dL (ref 3.5–5.2)
Alkaline Phosphatase: 67 U/L (ref 39–117)
BUN: 22 mg/dL (ref 6–23)
CO2: 26 mEq/L (ref 19–32)
Calcium: 9.8 mg/dL (ref 8.4–10.5)
Chloride: 105 mEq/L (ref 96–112)
Creatinine, Ser: 1.4 mg/dL — ABNORMAL HIGH (ref 0.40–1.20)
GFR: 37.73 mL/min — ABNORMAL LOW (ref >60.00)
Glucose, Bld: 99 mg/dL (ref 70–99)
Potassium: 4.5 mEq/L (ref 3.5–5.1)
Sodium: 139 mEq/L (ref 135–145)
Total Bilirubin: 0.8 mg/dL (ref 0.2–1.2)
Total Protein: 7.3 g/dL (ref 6.0–8.3)

## 2016-01-28 LAB — LDL CHOLESTEROL, DIRECT: Direct LDL: 130 mg/dL

## 2016-01-28 LAB — TSH: TSH: 1.82 u[IU]/mL (ref 0.35–4.50)

## 2016-01-28 LAB — VITAMIN D 25 HYDROXY (VIT D DEFICIENCY, FRACTURES): VITD: 26.21 ng/mL — ABNORMAL LOW (ref 30.00–100.00)

## 2016-01-28 NOTE — Progress Notes (Signed)
Subjective:  Patient ID: Pamela Paul, female    DOB: July 03, 1926  Age: 82 y.o. MRN: [id number]  CC: The primary encounter diagnosis was CKD (chronic kidney disease) stage 3, GFR 30-59 ml/min. Diagnoses of Heart disease, Essential hypertension, Hypothyroidism due to acquired atrophy of thyroid, Need for prophylactic vaccination against Streptococcus pneumoniae (pneumococcus), and Hyperlipidemia were also pertinent to this visit.  HPI Pamela Paul presents for 6 month follow up on hypertension , CKD and hypothyroidism. Patient is taking her medications as prescribed and notes no adverse effects.  Home BP readings have been done about once per week and are  generally < 130/80 .  She is avoiding added salt in her diet and walking regularly about 3 times per week for exercise  .   Outpatient Prescriptions Prior to Visit  Medication Sig Dispense Refill  . aspirin 81 MG tablet Take 160 mg by mouth daily.    . enalapril (VASOTEC) 20 MG tablet TAKE ONE TABLET BY MOUTH ONCE DAILY 90 tablet 0  . levothyroxine (SYNTHROID, LEVOTHROID) 50 MCG tablet TAKE ONE TABLET BY MOUTH ONCE DAILY 90 tablet 3  . Multiple Vitamins-Minerals (PRESERVISION AREDS PO) Take 2 tablets by mouth daily.     . Calcium-Vitamin D (CALTRATE 600 PLUS-VIT D PO) Reported on 01/28/2016     No facility-administered medications prior to visit.    Review of Systems;  Patient denies headache, fevers, malaise, unintentional weight loss, skin rash, eye pain, sinus congestion and sinus pain, sore throat, dysphagia,  hemoptysis , cough, dyspnea, wheezing, chest pain, palpitations, orthopnea, edema, abdominal pain, nausea, melena, diarrhea, constipation, flank pain, dysuria, hematuria, urinary  Frequency, nocturia, numbness, tingling, seizures,  Focal weakness, Loss of consciousness,  Tremor, insomnia, depression, anxiety, and suicidal ideation.      Objective:  BP 104/60 mmHg  Pulse 62  Temp(Src) 97.6 F (36.4 C) (Oral)  Resp 12  Ht  5\' 6"  (1.676 m)  Wt 189 lb (85.73 kg)  BMI 30.52 kg/m2  SpO2 97%  BP Readings from Last 3 Encounters:  01/28/16 104/60  07/28/15 126/68  04/26/15 120/70    Wt Readings from Last 3 Encounters:  01/28/16 189 lb (85.73 kg)  07/28/15 188 lb 8 oz (85.503 kg)  04/26/15 189 lb 8 oz (85.957 kg)    General appearance: alert, cooperative and appears stated age Ears: normal TM's and external ear canals both ears Throat: lips, mucosa, and tongue normal; teeth and gums normal Neck: no adenopathy, no carotid bruit, supple, symmetrical, trachea midline and thyroid not enlarged, symmetric, no tenderness/mass/nodules Back: symmetric, no curvature. ROM normal. No CVA tenderness. Lungs: clear to auscultation bilaterally Heart: regular rate and rhythm, S1, S2 normal, no murmur, click, rub or gallop Abdomen: soft, non-tender; bowel sounds normal; no masses,  no organomegaly Pulses: 2+ and symmetric Skin: Skin color, texture, turgor normal. No rashes or lesions Lymph nodes: Cervical, supraclavicular, and axillary nodes normal.  No results found for: HGBA1C  Lab Results  Component Value Date   CREATININE 1.40* 01/28/2016   CREATININE 1.51* 04/26/2015   CREATININE 1.4* 10/22/2014    Lab Results  Component Value Date   WBC 6.1 01/28/2016   HGB 12.4 01/28/2016   HCT 36.7 01/28/2016   PLT 320.0 01/28/2016   GLUCOSE 99 01/28/2016   CHOL 208* 01/28/2016   TRIG 212.0* 01/28/2016   HDL 48.00 01/28/2016   LDLDIRECT 130.0 01/28/2016   LDLCALC 104 10/22/2014   ALT 16 01/28/2016   AST 22 01/28/2016   NA 139 01/28/2016  K 4.5 01/28/2016   CL 105 01/28/2016   CREATININE 1.40* 01/28/2016   BUN 22 01/28/2016   CO2 26 01/28/2016   TSH 1.82 01/28/2016    Ct Abdomen Wo Contrast  06/30/2015  CLINICAL DATA:  Recent abnormal ultrasound with left renal mass EXAM: CT ABDOMEN WITHOUT CONTRAST TECHNIQUE: Multidetector CT imaging of the abdomen was performed following the standard protocol without IV  contrast. COMPARISON:  05/16/2009, by report from recent ultrasound dated 06/16/2015 FINDINGS: Lung bases are well aerated with mild scarring in the lower lobe stable from the prior exam. The gallbladder has been surgically removed. The liver, spleen, adrenal glands and pancreas are within normal limits. Kidneys are well visualized and demonstrate a few tiny nonobstructing renal stones on the right. No obstructive changes are noted. In the upper pole of the left kidney laterally there is an exophytic hypodense lesion measuring 4.3 by 3.3 cm in greatest dimension. It demonstrates some peripheral calcification and relatively fluid attenuation. No definitive solid component is noted. When compare with the prior CT examination from 2010 this lesion was present but measured only 3.5 cm in greatest dimension. Fecal material is noted within the colon. No focal mass lesion is seen. Aortoiliac calcifications are noted without aneurysmal dilatation. Degenerative and postoperative changes of the lumbar spine are seen. IMPRESSION: Upper pole left renal cyst with slight increase in size from 2010 as described. This corresponds to the abnormality seen on recent ultrasound. No other acute abnormality is noted. Electronically Signed   By: Inez Catalina M.D.   On: 06/30/2015 09:50    Assessment & Plan:   Problem List Items Addressed This Visit    Hypothyroidism    Thyroid function is WNL on current dose.  No current changes needed.   Lab Results  Component Value Date   TSH 1.82 01/28/2016         Relevant Orders   TSH (Completed)   Hypertension    Well controlled on current regimen. Renal function stable, no changes today.      Hyperlipidemia    LDL has been at  goal without statin therapy , and elevation of triglycerides has been addressed with low glycemic index diet and Exercise .  Given her age,  No statin therapy advised  Lab Results  Component Value Date   CHOL 208* 01/28/2016   HDL 48.00 01/28/2016     LDLCALC 104 10/22/2014   LDLDIRECT 130.0 01/28/2016   TRIG 212.0* 01/28/2016   CHOLHDL 4 01/28/2016   Lab Results  Component Value Date   ALT 16 01/28/2016   AST 22 01/28/2016   ALKPHOS 67 01/28/2016   BILITOT 0.8 01/28/2016                CKD (chronic kidney disease) stage 3, GFR 30-59 ml/min - Primary    Renal function is at  baseline with avoidance of NSAIDs.  She is on an ACE  for control of hypertension. A  total of 25 minutes of face to face time was spent with patient more than half of which was spent in counselling on the above mentioned condition.   Lab Results  Component Value Date   NA 139 01/28/2016   K 4.5 01/28/2016   CL 105 01/28/2016   CO2 26 01/28/2016   Lab Results  Component Value Date   CREATININE 1.40* 01/28/2016         Relevant Orders   Comprehensive metabolic panel (Completed)   VITAMIN D 25 Hydroxy (Vit-D Deficiency, Fractures) (  Completed)   CBC with Differential/Platelet (Completed)   PTH, intact and calcium   Heart disease   Relevant Orders   Lipid panel (Completed)   LDL cholesterol, direct (Completed)    Other Visit Diagnoses    Need for prophylactic vaccination against Streptococcus pneumoniae (pneumococcus)        Relevant Orders    Pneumococcal polysaccharide vaccine 23-valent greater than or equal to 2yo subcutaneous/IM (Completed)       I have discontinued Ms. Waddell's Calcium-Vitamin D (CALTRATE 600 PLUS-VIT D PO). I am also having her maintain her aspirin, Multiple Vitamins-Minerals (PRESERVISION AREDS PO), levothyroxine, and enalapril.  No orders of the defined types were placed in this encounter.    Medications Discontinued During This Encounter  Medication Reason  . Calcium-Vitamin D (CALTRATE 600 PLUS-VIT D PO) Error    Follow-up: Return in about 6 months (around 07/27/2016) for wellness/CPE.   [name], MD

## 2016-01-28 NOTE — Progress Notes (Signed)
Pre-visit discussion using our clinic review tool. No additional management support is needed unless otherwise documented below in the visit note.  

## 2016-01-28 NOTE — Patient Instructions (Signed)
Chronic Kidney Disease °Chronic kidney disease occurs when the kidneys are damaged over a long period. The kidneys are two organs that lie on either side of the spine between the middle of the back and the front of the abdomen. The kidneys: °· Remove wastes and extra water from the blood. °· Produce important hormones. These help keep bones strong, regulate blood pressure, and help create red blood cells. °· Balance the fluids and chemicals in the blood and tissues. °A small amount of kidney damage may not cause problems, but a large amount of damage may make it difficult or impossible for the kidneys to work the way they should. If steps are not taken to slow down the kidney damage or stop it from getting worse, the kidneys may stop working permanently. Most of the time, chronic kidney disease does not go away. However, it can often be controlled, and those with the disease can usually live normal lives. °CAUSES °The most common causes of chronic kidney disease are diabetes and high blood pressure (hypertension). Chronic kidney disease may also be caused by: °· Diseases that cause the kidneys' filters to become inflamed. °· Diseases that affect the immune system. °· Genetic diseases. °· Medicines that damage the kidneys, such as anti-inflammatory medicines. °· Poisoning or exposure to toxic substances. °· A reoccurring kidney or urinary infection. °· A problem with urine flow. This may be caused by: °¨ Cancer. °¨ Kidney stones. °¨ An enlarged prostate in males. °SIGNS AND SYMPTOMS °Because the kidney damage in chronic kidney disease occurs slowly, symptoms develop slowly and may not be obvious until the kidney damage becomes severe. A person may have a kidney disease for years without showing any symptoms. Symptoms can include: °· Swelling (edema) of the legs, ankles, or feet. °· Tiredness (lethargy). °· Nausea or vomiting. °· Confusion. °· Problems with urination, such as: °¨ Decreased urine  production. °¨ Frequent urination, especially at night. °¨ Frequent accidents in children who are potty trained. °· Muscle twitches and cramps. °· Shortness of breath. °· Weakness. °· Persistent itchiness. °· Loss of appetite. °· Metallic taste in the mouth. °· Trouble sleeping. °· Slowed development in children. °· Short stature in children. °DIAGNOSIS °Chronic kidney disease may be detected and diagnosed by tests, including blood, urine, imaging, or kidney biopsy tests. °TREATMENT °Most chronic kidney diseases cannot be cured. Treatment usually involves relieving symptoms and preventing or slowing the progression of the disease. Treatment may include: °· A special diet. You may need to avoid alcohol and foods that are salty and high in potassium. °· Medicines. These may: °¨ Lower blood pressure. °¨ Relieve anemia. °¨ Relieve swelling. °¨ Protect the bones. °HOME CARE INSTRUCTIONS °· Follow your prescribed diet.  Your health care provider may instruct you to limit daily salt (sodium) and protein intake. °· Take medicines only as directed by your health care provider. Do not take any new medicines (prescription, over-the-counter, or nutritional supplements) unless approved by your health care provider. Many medicines can worsen your kidney damage or need to have the dose adjusted.   °· Quit smoking if you smoke. Talk to your health care provider about a smoking cessation program. °· Keep all follow-up visits as directed by your health care provider. °· Monitor your blood pressure. °· Start or continue an exercise plan. °· Get immunizations as directed by your health care provider. °· Take vitamin and mineral supplements as directed by your health care provider. °SEEK IMMEDIATE MEDICAL CARE IF: °· Your symptoms get worse or you develop   new symptoms. °· You develop symptoms of end-stage kidney disease. These include: °¨ Headaches. °¨ Abnormally dark or light skin. °¨ Numbness in the hands or feet. °¨ Easy  bruising. °¨ Frequent hiccups. °¨ Menstruation stops. °· You have a fever. °· You have decreased urine production. °· You have pain or bleeding when urinating. °MAKE SURE YOU: °· Understand these instructions. °· Will watch your condition. °· Will get help right away if you are not doing well or get worse. °FOR MORE INFORMATION  °· American Association of Kidney Patients: www.aakp.org °· National Kidney Foundation: www.kidney.org °· [employer]: www.akfinc.org °· [employer] [facility]: www.lifeoptions.org and www.kidneyschool.org °  °This information is not intended to replace advice given to you by your health care provider. Make sure you discuss any questions you have with your health care provider. °  °Document Released: 08/29/2008 Document Revised: 12/11/2014 Document Reviewed: 07/19/2012 °Elsevier Interactive Patient Education ©2016 [employer] ° °

## 2016-01-30 ENCOUNTER — Encounter: Payer: Self-pay | Admitting: Internal Medicine

## 2016-01-30 NOTE — Assessment & Plan Note (Addendum)
Renal function is at  baseline with avoidance of NSAIDs.  She is on an ACE  for control of hypertension. A  total of 25 minutes of face to face time was spent with patient more than half of which was spent in counselling on the above mentioned condition.   Lab Results  Component Value Date   NA 139 01/28/2016   K 4.5 01/28/2016   CL 105 01/28/2016   CO2 26 01/28/2016   Lab Results  Component Value Date   CREATININE 1.40* 01/28/2016

## 2016-01-30 NOTE — Assessment & Plan Note (Signed)
LDL has been at  goal without statin therapy , and elevation of triglycerides has been addressed with low glycemic index diet and Exercise .  Given her age,  No statin therapy advised  Lab Results  Component Value Date   CHOL 208* 01/28/2016   HDL 48.00 01/28/2016   LDLCALC 104 10/22/2014   LDLDIRECT 130.0 01/28/2016   TRIG 212.0* 01/28/2016   CHOLHDL 4 01/28/2016   Lab Results  Component Value Date   ALT 16 01/28/2016   AST 22 01/28/2016   ALKPHOS 67 01/28/2016   BILITOT 0.8 01/28/2016

## 2016-01-30 NOTE — Assessment & Plan Note (Signed)
Thyroid function is WNL on current dose.  No current changes needed.   Lab Results  Component Value Date   TSH 1.82 01/28/2016

## 2016-01-30 NOTE — Assessment & Plan Note (Signed)
Well controlled on current regimen. Renal function stable, no changes today. 

## 2016-01-31 DIAGNOSIS — R809 Proteinuria, unspecified: Secondary | ICD-10-CM | POA: Diagnosis not present

## 2016-01-31 DIAGNOSIS — N183 Chronic kidney disease, stage 3 (moderate): Secondary | ICD-10-CM | POA: Diagnosis not present

## 2016-01-31 DIAGNOSIS — I129 Hypertensive chronic kidney disease with stage 1 through stage 4 chronic kidney disease, or unspecified chronic kidney disease: Secondary | ICD-10-CM | POA: Diagnosis not present

## 2016-02-01 ENCOUNTER — Encounter: Payer: Self-pay | Admitting: Internal Medicine

## 2016-02-01 ENCOUNTER — Telehealth: Payer: Self-pay | Admitting: *Deleted

## 2016-02-01 DIAGNOSIS — I129 Hypertensive chronic kidney disease with stage 1 through stage 4 chronic kidney disease, or unspecified chronic kidney disease: Secondary | ICD-10-CM | POA: Diagnosis not present

## 2016-02-01 DIAGNOSIS — R809 Proteinuria, unspecified: Secondary | ICD-10-CM | POA: Diagnosis not present

## 2016-02-01 DIAGNOSIS — N183 Chronic kidney disease, stage 3 (moderate): Secondary | ICD-10-CM | POA: Diagnosis not present

## 2016-02-01 LAB — PTH, INTACT AND CALCIUM

## 2016-02-01 NOTE — Telephone Encounter (Signed)
Left pt a message for a redraw. Need another frozen recollected for her PTH

## 2016-02-02 ENCOUNTER — Other Ambulatory Visit: Payer: Medicare Other

## 2016-02-02 ENCOUNTER — Telehealth: Payer: Self-pay | Admitting: *Deleted

## 2016-02-02 ENCOUNTER — Other Ambulatory Visit: Payer: Self-pay | Admitting: Internal Medicine

## 2016-02-02 DIAGNOSIS — N184 Chronic kidney disease, stage 4 (severe): Secondary | ICD-10-CM

## 2016-02-02 NOTE — Telephone Encounter (Signed)
Medication refill for Enalapril 20 mg [place]

## 2016-02-02 NOTE — Telephone Encounter (Signed)
Confirmed filled by pharmacy

## 2016-02-03 DIAGNOSIS — N184 Chronic kidney disease, stage 4 (severe): Secondary | ICD-10-CM | POA: Diagnosis not present

## 2016-02-04 LAB — PTH, INTACT AND CALCIUM
Calcium: 9.3 mg/dL (ref 8.4–10.5)
PTH: 60 pg/mL (ref 14–64)

## 2016-02-08 ENCOUNTER — Encounter: Payer: Self-pay | Admitting: Internal Medicine

## 2016-02-15 DIAGNOSIS — H43813 Vitreous degeneration, bilateral: Secondary | ICD-10-CM | POA: Diagnosis not present

## 2016-02-15 DIAGNOSIS — H353131 Nonexudative age-related macular degeneration, bilateral, early dry stage: Secondary | ICD-10-CM | POA: Diagnosis not present

## 2016-02-15 NOTE — Telephone Encounter (Signed)
Mailed unread message to patient.  

## 2016-05-07 ENCOUNTER — Other Ambulatory Visit: Payer: Self-pay | Admitting: Internal Medicine

## 2016-05-09 DIAGNOSIS — S43004A Unspecified dislocation of right shoulder joint, initial encounter: Secondary | ICD-10-CM | POA: Diagnosis not present

## 2016-05-09 DIAGNOSIS — S4991XA Unspecified injury of right shoulder and upper arm, initial encounter: Secondary | ICD-10-CM | POA: Diagnosis not present

## 2016-05-09 DIAGNOSIS — S42291A Other displaced fracture of upper end of right humerus, initial encounter for closed fracture: Secondary | ICD-10-CM | POA: Diagnosis not present

## 2016-05-09 DIAGNOSIS — Z87891 Personal history of nicotine dependence: Secondary | ICD-10-CM | POA: Diagnosis not present

## 2016-05-09 DIAGNOSIS — S42341A Displaced spiral fracture of shaft of humerus, right arm, initial encounter for closed fracture: Secondary | ICD-10-CM | POA: Diagnosis not present

## 2016-05-09 DIAGNOSIS — S42201A Unspecified fracture of upper end of right humerus, initial encounter for closed fracture: Secondary | ICD-10-CM | POA: Diagnosis not present

## 2016-05-09 DIAGNOSIS — S42331A Displaced oblique fracture of shaft of humerus, right arm, initial encounter for closed fracture: Secondary | ICD-10-CM | POA: Diagnosis not present

## 2016-05-16 DIAGNOSIS — S42201A Unspecified fracture of upper end of right humerus, initial encounter for closed fracture: Secondary | ICD-10-CM | POA: Diagnosis not present

## 2016-05-16 DIAGNOSIS — S42341D Displaced spiral fracture of shaft of humerus, right arm, subsequent encounter for fracture with routine healing: Secondary | ICD-10-CM | POA: Diagnosis not present

## 2016-05-16 DIAGNOSIS — S42341S Displaced spiral fracture of shaft of humerus, right arm, sequela: Secondary | ICD-10-CM | POA: Diagnosis not present

## 2016-05-16 DIAGNOSIS — S42341A Displaced spiral fracture of shaft of humerus, right arm, initial encounter for closed fracture: Secondary | ICD-10-CM | POA: Diagnosis not present

## 2016-05-30 DIAGNOSIS — S42341D Displaced spiral fracture of shaft of humerus, right arm, subsequent encounter for fracture with routine healing: Secondary | ICD-10-CM | POA: Diagnosis not present

## 2016-05-30 DIAGNOSIS — T148 Other injury of unspecified body region: Secondary | ICD-10-CM | POA: Diagnosis not present

## 2016-05-30 DIAGNOSIS — S42301D Unspecified fracture of shaft of humerus, right arm, subsequent encounter for fracture with routine healing: Secondary | ICD-10-CM | POA: Diagnosis not present

## 2016-05-30 DIAGNOSIS — S42391D Other fracture of shaft of right humerus, subsequent encounter for fracture with routine healing: Secondary | ICD-10-CM | POA: Diagnosis not present

## 2016-06-05 DIAGNOSIS — M6281 Muscle weakness (generalized): Secondary | ICD-10-CM | POA: Diagnosis not present

## 2016-06-05 DIAGNOSIS — R2689 Other abnormalities of gait and mobility: Secondary | ICD-10-CM | POA: Diagnosis not present

## 2016-06-07 DIAGNOSIS — M6281 Muscle weakness (generalized): Secondary | ICD-10-CM | POA: Diagnosis not present

## 2016-06-07 DIAGNOSIS — R2689 Other abnormalities of gait and mobility: Secondary | ICD-10-CM | POA: Diagnosis not present

## 2016-06-08 DIAGNOSIS — M6281 Muscle weakness (generalized): Secondary | ICD-10-CM | POA: Diagnosis not present

## 2016-06-08 DIAGNOSIS — R2689 Other abnormalities of gait and mobility: Secondary | ICD-10-CM | POA: Diagnosis not present

## 2016-06-12 DIAGNOSIS — M6281 Muscle weakness (generalized): Secondary | ICD-10-CM | POA: Diagnosis not present

## 2016-06-12 DIAGNOSIS — R2689 Other abnormalities of gait and mobility: Secondary | ICD-10-CM | POA: Diagnosis not present

## 2016-06-14 DIAGNOSIS — R2689 Other abnormalities of gait and mobility: Secondary | ICD-10-CM | POA: Diagnosis not present

## 2016-06-14 DIAGNOSIS — M6281 Muscle weakness (generalized): Secondary | ICD-10-CM | POA: Diagnosis not present

## 2016-06-16 DIAGNOSIS — M6281 Muscle weakness (generalized): Secondary | ICD-10-CM | POA: Diagnosis not present

## 2016-06-16 DIAGNOSIS — R2689 Other abnormalities of gait and mobility: Secondary | ICD-10-CM | POA: Diagnosis not present

## 2016-06-19 DIAGNOSIS — R2689 Other abnormalities of gait and mobility: Secondary | ICD-10-CM | POA: Diagnosis not present

## 2016-06-19 DIAGNOSIS — M6281 Muscle weakness (generalized): Secondary | ICD-10-CM | POA: Diagnosis not present

## 2016-06-21 DIAGNOSIS — M6281 Muscle weakness (generalized): Secondary | ICD-10-CM | POA: Diagnosis not present

## 2016-06-21 DIAGNOSIS — R2689 Other abnormalities of gait and mobility: Secondary | ICD-10-CM | POA: Diagnosis not present

## 2016-06-23 DIAGNOSIS — M6281 Muscle weakness (generalized): Secondary | ICD-10-CM | POA: Diagnosis not present

## 2016-06-23 DIAGNOSIS — R2689 Other abnormalities of gait and mobility: Secondary | ICD-10-CM | POA: Diagnosis not present

## 2016-06-26 DIAGNOSIS — M6281 Muscle weakness (generalized): Secondary | ICD-10-CM | POA: Diagnosis not present

## 2016-06-26 DIAGNOSIS — R2689 Other abnormalities of gait and mobility: Secondary | ICD-10-CM | POA: Diagnosis not present

## 2016-06-28 DIAGNOSIS — R2689 Other abnormalities of gait and mobility: Secondary | ICD-10-CM | POA: Diagnosis not present

## 2016-06-28 DIAGNOSIS — M6281 Muscle weakness (generalized): Secondary | ICD-10-CM | POA: Diagnosis not present

## 2016-06-30 DIAGNOSIS — M6281 Muscle weakness (generalized): Secondary | ICD-10-CM | POA: Diagnosis not present

## 2016-06-30 DIAGNOSIS — R2689 Other abnormalities of gait and mobility: Secondary | ICD-10-CM | POA: Diagnosis not present

## 2016-07-03 DIAGNOSIS — M6281 Muscle weakness (generalized): Secondary | ICD-10-CM | POA: Diagnosis not present

## 2016-07-03 DIAGNOSIS — R2689 Other abnormalities of gait and mobility: Secondary | ICD-10-CM | POA: Diagnosis not present

## 2016-07-04 DIAGNOSIS — S42341D Displaced spiral fracture of shaft of humerus, right arm, subsequent encounter for fracture with routine healing: Secondary | ICD-10-CM | POA: Diagnosis not present

## 2016-07-06 DIAGNOSIS — R2689 Other abnormalities of gait and mobility: Secondary | ICD-10-CM | POA: Diagnosis not present

## 2016-07-06 DIAGNOSIS — M6281 Muscle weakness (generalized): Secondary | ICD-10-CM | POA: Diagnosis not present

## 2016-07-11 DIAGNOSIS — M6281 Muscle weakness (generalized): Secondary | ICD-10-CM | POA: Diagnosis not present

## 2016-07-11 DIAGNOSIS — R2689 Other abnormalities of gait and mobility: Secondary | ICD-10-CM | POA: Diagnosis not present

## 2016-07-13 DIAGNOSIS — R2689 Other abnormalities of gait and mobility: Secondary | ICD-10-CM | POA: Diagnosis not present

## 2016-07-13 DIAGNOSIS — M6281 Muscle weakness (generalized): Secondary | ICD-10-CM | POA: Diagnosis not present

## 2016-07-18 DIAGNOSIS — R2689 Other abnormalities of gait and mobility: Secondary | ICD-10-CM | POA: Diagnosis not present

## 2016-07-18 DIAGNOSIS — M6281 Muscle weakness (generalized): Secondary | ICD-10-CM | POA: Diagnosis not present

## 2016-07-20 DIAGNOSIS — R2689 Other abnormalities of gait and mobility: Secondary | ICD-10-CM | POA: Diagnosis not present

## 2016-07-20 DIAGNOSIS — M6281 Muscle weakness (generalized): Secondary | ICD-10-CM | POA: Diagnosis not present

## 2016-07-25 DIAGNOSIS — M6281 Muscle weakness (generalized): Secondary | ICD-10-CM | POA: Diagnosis not present

## 2016-07-25 DIAGNOSIS — R2689 Other abnormalities of gait and mobility: Secondary | ICD-10-CM | POA: Diagnosis not present

## 2016-07-27 DIAGNOSIS — R2689 Other abnormalities of gait and mobility: Secondary | ICD-10-CM | POA: Diagnosis not present

## 2016-07-27 DIAGNOSIS — M6281 Muscle weakness (generalized): Secondary | ICD-10-CM | POA: Diagnosis not present

## 2016-07-31 ENCOUNTER — Encounter: Payer: Medicare Other | Admitting: Internal Medicine

## 2016-07-31 DIAGNOSIS — N183 Chronic kidney disease, stage 3 (moderate): Secondary | ICD-10-CM | POA: Diagnosis not present

## 2016-07-31 DIAGNOSIS — N2581 Secondary hyperparathyroidism of renal origin: Secondary | ICD-10-CM | POA: Diagnosis not present

## 2016-07-31 DIAGNOSIS — I129 Hypertensive chronic kidney disease with stage 1 through stage 4 chronic kidney disease, or unspecified chronic kidney disease: Secondary | ICD-10-CM | POA: Diagnosis not present

## 2016-07-31 DIAGNOSIS — E875 Hyperkalemia: Secondary | ICD-10-CM | POA: Diagnosis not present

## 2016-08-01 DIAGNOSIS — S42341D Displaced spiral fracture of shaft of humerus, right arm, subsequent encounter for fracture with routine healing: Secondary | ICD-10-CM | POA: Diagnosis not present

## 2016-08-07 DIAGNOSIS — R2689 Other abnormalities of gait and mobility: Secondary | ICD-10-CM | POA: Diagnosis not present

## 2016-08-07 DIAGNOSIS — M6281 Muscle weakness (generalized): Secondary | ICD-10-CM | POA: Diagnosis not present

## 2016-08-08 DIAGNOSIS — H43813 Vitreous degeneration, bilateral: Secondary | ICD-10-CM | POA: Diagnosis not present

## 2016-08-08 DIAGNOSIS — H353130 Nonexudative age-related macular degeneration, bilateral, stage unspecified: Secondary | ICD-10-CM | POA: Diagnosis not present

## 2016-08-08 DIAGNOSIS — Z961 Presence of intraocular lens: Secondary | ICD-10-CM | POA: Diagnosis not present

## 2016-08-11 DIAGNOSIS — R2689 Other abnormalities of gait and mobility: Secondary | ICD-10-CM | POA: Diagnosis not present

## 2016-08-11 DIAGNOSIS — M6281 Muscle weakness (generalized): Secondary | ICD-10-CM | POA: Diagnosis not present

## 2016-08-14 DIAGNOSIS — N183 Chronic kidney disease, stage 3 (moderate): Secondary | ICD-10-CM | POA: Diagnosis not present

## 2016-08-14 DIAGNOSIS — I10 Essential (primary) hypertension: Secondary | ICD-10-CM | POA: Diagnosis not present

## 2016-08-14 DIAGNOSIS — N2581 Secondary hyperparathyroidism of renal origin: Secondary | ICD-10-CM | POA: Diagnosis not present

## 2016-08-14 DIAGNOSIS — N2889 Other specified disorders of kidney and ureter: Secondary | ICD-10-CM | POA: Diagnosis not present

## 2016-08-15 DIAGNOSIS — R2689 Other abnormalities of gait and mobility: Secondary | ICD-10-CM | POA: Diagnosis not present

## 2016-08-15 DIAGNOSIS — M6281 Muscle weakness (generalized): Secondary | ICD-10-CM | POA: Diagnosis not present

## 2016-08-17 DIAGNOSIS — M6281 Muscle weakness (generalized): Secondary | ICD-10-CM | POA: Diagnosis not present

## 2016-08-17 DIAGNOSIS — R2689 Other abnormalities of gait and mobility: Secondary | ICD-10-CM | POA: Diagnosis not present

## 2016-08-30 DIAGNOSIS — R2689 Other abnormalities of gait and mobility: Secondary | ICD-10-CM | POA: Diagnosis not present

## 2016-08-30 DIAGNOSIS — M6281 Muscle weakness (generalized): Secondary | ICD-10-CM | POA: Diagnosis not present

## 2016-09-06 DIAGNOSIS — Z08 Encounter for follow-up examination after completed treatment for malignant neoplasm: Secondary | ICD-10-CM | POA: Diagnosis not present

## 2016-09-06 DIAGNOSIS — L821 Other seborrheic keratosis: Secondary | ICD-10-CM | POA: Diagnosis not present

## 2016-09-06 DIAGNOSIS — L570 Actinic keratosis: Secondary | ICD-10-CM | POA: Diagnosis not present

## 2016-09-06 DIAGNOSIS — X32XXXA Exposure to sunlight, initial encounter: Secondary | ICD-10-CM | POA: Diagnosis not present

## 2016-09-06 DIAGNOSIS — Z85828 Personal history of other malignant neoplasm of skin: Secondary | ICD-10-CM | POA: Diagnosis not present

## 2016-09-12 DIAGNOSIS — M79621 Pain in right upper arm: Secondary | ICD-10-CM | POA: Diagnosis not present

## 2016-09-12 DIAGNOSIS — S42301D Unspecified fracture of shaft of humerus, right arm, subsequent encounter for fracture with routine healing: Secondary | ICD-10-CM | POA: Diagnosis not present

## 2016-09-28 DIAGNOSIS — Z23 Encounter for immunization: Secondary | ICD-10-CM | POA: Diagnosis not present

## 2016-10-10 ENCOUNTER — Encounter: Payer: Self-pay | Admitting: Internal Medicine

## 2016-10-10 ENCOUNTER — Telehealth: Payer: Self-pay | Admitting: Internal Medicine

## 2016-10-10 ENCOUNTER — Ambulatory Visit (INDEPENDENT_AMBULATORY_CARE_PROVIDER_SITE_OTHER): Payer: Medicare Other | Admitting: Internal Medicine

## 2016-10-10 VITALS — BP 132/78 | HR 76 | Temp 97.6°F | Resp 10 | Ht 66.0 in | Wt 189.5 lb

## 2016-10-10 DIAGNOSIS — E034 Atrophy of thyroid (acquired): Secondary | ICD-10-CM | POA: Diagnosis not present

## 2016-10-10 DIAGNOSIS — Z780 Asymptomatic menopausal state: Secondary | ICD-10-CM

## 2016-10-10 DIAGNOSIS — S42291S Other displaced fracture of upper end of right humerus, sequela: Secondary | ICD-10-CM

## 2016-10-10 DIAGNOSIS — I10 Essential (primary) hypertension: Secondary | ICD-10-CM

## 2016-10-10 DIAGNOSIS — E559 Vitamin D deficiency, unspecified: Secondary | ICD-10-CM | POA: Diagnosis not present

## 2016-10-10 DIAGNOSIS — E7800 Pure hypercholesterolemia, unspecified: Secondary | ICD-10-CM

## 2016-10-10 DIAGNOSIS — E213 Hyperparathyroidism, unspecified: Secondary | ICD-10-CM | POA: Diagnosis not present

## 2016-10-10 DIAGNOSIS — E785 Hyperlipidemia, unspecified: Secondary | ICD-10-CM | POA: Diagnosis not present

## 2016-10-10 NOTE — Telephone Encounter (Signed)
Upon checkout pt asked to deactivate her My Chart account. She stated that it is too confusing and does not wish to use it.

## 2016-10-10 NOTE — Progress Notes (Signed)
Pre-visit discussion using our clinic review tool. No additional management support is needed unless otherwise documented below in the visit note.  

## 2016-10-10 NOTE — Patient Instructions (Signed)
Osteoporosis  Osteoporosis is the thinning and loss of density in the bones. Osteoporosis makes the bones more brittle, fragile, and likely to break (fracture). Over time, osteoporosis can cause the bones to become so weak that they fracture after a simple fall. The bones most likely to fracture are the bones in the hip, wrist, and spine.  CAUSES   The exact cause is not known.  RISK FACTORS  Anyone can develop osteoporosis. You may be at greater risk if you have a family history of the condition or have poor nutrition. You may also have a higher risk if you are:   · Female.    · 50 years old or older.  · A smoker.  · Not physically active.    · White or Asian.  · Slender.  SIGNS AND SYMPTOMS   A fracture might be the first sign of the disease, especially if it results from a fall or injury that would not usually cause a bone to break. Other signs and symptoms include:   · Low back and neck pain.  · Stooped posture.  · Height loss.  DIAGNOSIS   To make a diagnosis, your health care provider may:  · Take a medical history.  · Perform a physical exam.  · Order tests, such as:    A bone mineral density test.    A dual-energy X-ray absorptiometry test.  TREATMENT   The goal of osteoporosis treatment is to strengthen your bones to reduce your risk of a fracture. Treatment may involve:  · Making lifestyle changes, such as:    Eating a diet rich in calcium.    Doing weight-bearing and muscle-strengthening exercises.    Stopping tobacco use.    Limiting alcohol intake.  · Taking medicine to slow the process of bone loss or to increase bone density.  · Monitoring your levels of calcium and vitamin D.  HOME CARE INSTRUCTIONS  · Include calcium and vitamin D in your diet. Calcium is important for bone health, and vitamin D helps the body absorb calcium.  · Perform weight-bearing and muscle-strengthening exercises as directed by your health care provider.  · Do not use any tobacco products, including cigarettes, chewing  tobacco, and electronic cigarettes. If you need help quitting, ask your health care provider.  · Limit your alcohol intake.  · Take medicines only as directed by your health care provider.  · Keep all follow-up visits as directed by your health care provider. This is important.  · Take precautions at home to lower your risk of falling, such as:    Keeping rooms well lit and clutter free.    Installing safety rails on stairs.    Using rubber mats in the bathroom and other areas that are often wet or slippery.  SEEK IMMEDIATE MEDICAL CARE IF:   You fall or injure yourself.      This information is not intended to replace advice given to you by your health care provider. Make sure you discuss any questions you have with your health care provider.     Document Released: 08/30/2005 Document Revised: 12/11/2014 Document Reviewed: 04/30/2014  Elsevier Interactive Patient Education ©2016 [employer]

## 2016-10-10 NOTE — Progress Notes (Signed)
Subjective:  Patient ID: Pamela Paul, female    DOB: Jul 18, 1926  Age: 82 y.o. MRN: [id number]  CC: The primary encounter diagnosis was Vitamin D deficiency. Diagnoses of Hypothyroidism due to acquired atrophy of thyroid, Hyperparathyroidism ([place]), Hyperlipidemia LDL goal <100, Postmenopausal estrogen deficiency, Essential hypertension, Pure hypercholesterolemia, and Other closed displaced fracture of proximal end of right humerus, sequela were also pertinent to this visit.  HPI Pamela Paul presents for fo llow up on chronic conditions including CKD, hypertension and hyperlipidemia.   Patient is taking her medications as prescribed and notes no adverse effects.  Home BP readings have been done about once per week and are  generally < 130/80 .  She is avoiding added salt in her diet and walking regularly about 3 times per week for exercise  .  She has been progressively losing her vision  To Macular degeneration a,  Can no longer read easily  Since her last visit she suffered a right humeral spiral fracture in June while in [place],  She fell against the hood of a ar with her outstretched hand.   Last DEXA was in  2010   Lab Results  Component Value Date   TSH 1.95 10/10/2016    Lab Results  Component Value Date   CREATININE 1.40 (H) 01/28/2016      Outpatient Medications Prior to Visit  Medication Sig Dispense Refill  . aspirin 81 MG tablet Take 160 mg by mouth daily.    Marland Kitchen levothyroxine (SYNTHROID, LEVOTHROID) 50 MCG tablet TAKE ONE TABLET BY MOUTH ONCE DAILY 90 tablet 2  . Multiple Vitamins-Minerals (PRESERVISION AREDS PO) Take 2 tablets by mouth daily.     . enalapril (VASOTEC) 20 MG tablet TAKE ONE TABLET BY MOUTH ONCE DAILY (Patient taking differently: TAKE 0.5 TABLET BY MOUTH ONCE DAILY) 90 tablet 3   No facility-administered medications prior to visit.     Review of Systems;  Patient denies headache, fevers, malaise, unintentional weight loss, skin rash, eye  pain, sinus congestion and sinus pain, sore throat, dysphagia,  hemoptysis , cough, dyspnea, wheezing, chest pain, palpitations, orthopnea, edema, abdominal pain, nausea, melena, diarrhea, constipation, flank pain, dysuria, hematuria, urinary  Frequency, nocturia, numbness, tingling, seizures,  Focal weakness, Loss of consciousness,  Tremor, insomnia, depression, anxiety, and suicidal ideation.      Objective:  BP 132/78   Pulse 76   Temp 97.6 F (36.4 C) (Oral)   Resp 10   Ht 5\' 6"  (1.676 m)   Wt 189 lb 8 oz (86 kg)   SpO2 95%   BMI 30.59 kg/m   BP Readings from Last 3 Encounters:  10/11/16 122/78  10/10/16 132/78  01/28/16 104/60    Wt Readings from Last 3 Encounters:  10/11/16 190 lb 6.4 oz (86.4 kg)  10/10/16 189 lb 8 oz (86 kg)  01/28/16 189 lb (85.7 kg)    General appearance: alert, cooperative and appears stated age Ears: normal TM's and external ear canals both ears Throat: lips, mucosa, and tongue normal; teeth and gums normal Neck: no adenopathy, no carotid bruit, supple, symmetrical, trachea midline and thyroid not enlarged, symmetric, no tenderness/mass/nodules Back: symmetric, no curvature. ROM normal. No CVA tenderness. Lungs: clear to auscultation bilaterally Heart: regular rate and rhythm, S1, S2 normal, no murmur, click, rub or gallop Abdomen: soft, non-tender; bowel sounds normal; no masses,  no organomegaly Pulses: 2+ and symmetric Skin: Skin color, texture, turgor normal. No rashes or lesions Lymph nodes: Cervical, supraclavicular, and axillary nodes normal.  No results found for: HGBA1C  Lab Results  Component Value Date   CREATININE 1.40 (H) 01/28/2016   CREATININE 1.51 (H) 04/26/2015   CREATININE 1.4 (A) 10/22/2014    Lab Results  Component Value Date   WBC 6.1 01/28/2016   HGB 12.4 01/28/2016   HCT 36.7 01/28/2016   PLT 320.0 01/28/2016   GLUCOSE 99 01/28/2016   CHOL 227 (H) 10/10/2016   TRIG 237.0 (H) 10/10/2016   HDL 50.10 10/10/2016    LDLDIRECT 157.0 10/10/2016   LDLCALC 104 10/22/2014   ALT 16 01/28/2016   AST 22 01/28/2016   NA 139 01/28/2016   K 4.5 01/28/2016   CL 105 01/28/2016   CREATININE 1.40 (H) 01/28/2016   BUN 22 01/28/2016   CO2 26 01/28/2016   TSH 1.95 10/10/2016    Ct Abdomen Wo Contrast  Result Date: 06/30/2015 CLINICAL DATA:  Recent abnormal ultrasound with left renal mass EXAM: CT ABDOMEN WITHOUT CONTRAST TECHNIQUE: Multidetector CT imaging of the abdomen was performed following the standard protocol without IV contrast. COMPARISON:  05/16/2009, by report from recent ultrasound dated 06/16/2015 FINDINGS: Lung bases are well aerated with mild scarring in the lower lobe stable from the prior exam. The gallbladder has been surgically removed. The liver, spleen, adrenal glands and pancreas are within normal limits. Kidneys are well visualized and demonstrate a few tiny nonobstructing renal stones on the right. No obstructive changes are noted. In the upper pole of the left kidney laterally there is an exophytic hypodense lesion measuring 4.3 by 3.3 cm in greatest dimension. It demonstrates some peripheral calcification and relatively fluid attenuation. No definitive solid component is noted. When compare with the prior CT examination from 2010 this lesion was present but measured only 3.5 cm in greatest dimension. Fecal material is noted within the colon. No focal mass lesion is seen. Aortoiliac calcifications are noted without aneurysmal dilatation. Degenerative and postoperative changes of the lumbar spine are seen. IMPRESSION: Upper pole left renal cyst with slight increase in size from 2010 as described. This corresponds to the abnormality seen on recent ultrasound. No other acute abnormality is noted. Electronically Signed   By: Inez Catalina M.D.   On: 06/30/2015 09:50    Assessment & Plan:   Problem List Items Addressed This Visit    Hypothyroidism    Thyroid function is WNL on current dose.  No current  changes needed.   Lab Results  Component Value Date   TSH 1.95 10/10/2016         Relevant Orders   TSH (Completed)   Hypertension    Well controlled on current regimen. Renal function stable, no changes today.  Lab Results  Component Value Date   CREATININE 1.40 (H) 01/28/2016   Lab Results  Component Value Date   NA 139 01/28/2016   K 4.5 01/28/2016   CL 105 01/28/2016   CO2 26 01/28/2016         Relevant Medications   enalapril (VASOTEC) 10 MG tablet   Hyperlipidemia    LDL has been at  goal without statin therapy , and elevation of triglycerides has been addressed with low glycemic index diet and Exercise .  Given her age,  No statin therapy advised  Lab Results  Component Value Date   CHOL 227 (H) 10/10/2016   HDL 50.10 10/10/2016   LDLCALC 104 10/22/2014   LDLDIRECT 157.0 10/10/2016   TRIG 237.0 (H) 10/10/2016   CHOLHDL 5 10/10/2016   Lab Results  Component Value  Date   ALT 16 01/28/2016   AST 22 01/28/2016   ALKPHOS 67 01/28/2016   BILITOT 0.8 01/28/2016                Relevant Medications   enalapril (VASOTEC) 10 MG tablet   Proximal humeral fracture    Secondary to fall.  She has not had an assessment for osteoporosis sinc 2010. DEXA scan needed . Vitamin D level to be checked        Other Visit Diagnoses    Vitamin D deficiency    -  Primary   Relevant Orders   VITAMIN D 25 Hydroxy (Vit-D Deficiency, Fractures) (Completed)   Hyperparathyroidism ([place])       Relevant Orders   DG Bone Density   Hyperlipidemia LDL goal <100       Relevant Medications   enalapril (VASOTEC) 10 MG tablet   Other Relevant Orders   LDL cholesterol, direct (Completed)   Lipid panel (Completed)   Postmenopausal estrogen deficiency       Relevant Orders   DG Bone Density      I am having Pamela Paul maintain her aspirin, Multiple Vitamins-Minerals (PRESERVISION AREDS PO), levothyroxine, and enalapril.  Meds ordered this encounter  Medications  .  enalapril (VASOTEC) 10 MG tablet    Sig: Take 10 mg by mouth daily.     Medications Discontinued During This Encounter  Medication Reason  . enalapril (VASOTEC) 20 MG tablet Change in therapy    Follow-up: Return in about 3 months (around 01/10/2017) for follow up on DEXA results.   [name], MD

## 2016-10-11 ENCOUNTER — Ambulatory Visit (INDEPENDENT_AMBULATORY_CARE_PROVIDER_SITE_OTHER): Payer: Medicare Other

## 2016-10-11 VITALS — BP 122/78 | HR 76 | Temp 97.8°F | Resp 14 | Ht 66.0 in | Wt 190.4 lb

## 2016-10-11 DIAGNOSIS — Z0000 Encounter for general adult medical examination without abnormal findings: Secondary | ICD-10-CM

## 2016-10-11 DIAGNOSIS — S42209A Unspecified fracture of upper end of unspecified humerus, initial encounter for closed fracture: Secondary | ICD-10-CM | POA: Insufficient documentation

## 2016-10-11 LAB — VITAMIN D 25 HYDROXY (VIT D DEFICIENCY, FRACTURES): VITD: 28.93 ng/mL — ABNORMAL LOW (ref 30.00–100.00)

## 2016-10-11 LAB — LIPID PANEL
Cholesterol: 227 mg/dL — ABNORMAL HIGH (ref 0–200)
HDL: 50.1 mg/dL (ref >39.00)
NonHDL: 176.9
Total CHOL/HDL Ratio: 5
Triglycerides: 237 mg/dL — ABNORMAL HIGH (ref 0.0–149.0)
VLDL: 47.4 mg/dL — ABNORMAL HIGH (ref 0.0–40.0)

## 2016-10-11 LAB — TSH: TSH: 1.95 u[IU]/mL (ref 0.35–4.50)

## 2016-10-11 LAB — LDL CHOLESTEROL, DIRECT: Direct LDL: 157 mg/dL

## 2016-10-11 NOTE — Assessment & Plan Note (Signed)
Secondary to fall.  She has not had an assessment for osteoporosis sinc 2010. DEXA scan needed . Vitamin D level to be checked

## 2016-10-11 NOTE — Assessment & Plan Note (Signed)
Well controlled on current regimen. Renal function stable, no changes today.  Lab Results  Component Value Date   CREATININE 1.40 (H) 01/28/2016   Lab Results  Component Value Date   NA 139 01/28/2016   K 4.5 01/28/2016   CL 105 01/28/2016   CO2 26 01/28/2016

## 2016-10-11 NOTE — Assessment & Plan Note (Signed)
LDL has been at  goal without statin therapy , and elevation of triglycerides has been addressed with low glycemic index diet and Exercise .  Given her age,  No statin therapy advised  Lab Results  Component Value Date   CHOL 227 (H) 10/10/2016   HDL 50.10 10/10/2016   LDLCALC 104 10/22/2014   LDLDIRECT 157.0 10/10/2016   TRIG 237.0 (H) 10/10/2016   CHOLHDL 5 10/10/2016   Lab Results  Component Value Date   ALT 16 01/28/2016   AST 22 01/28/2016   ALKPHOS 67 01/28/2016   BILITOT 0.8 01/28/2016

## 2016-10-11 NOTE — Patient Instructions (Addendum)
Pamela Paul , Thank you for taking time to come for your Medicare Wellness Visit. I appreciate your ongoing commitment to your health goals. Please review the following plan we discussed and let me know if I can assist you in the future.   FOLLOW UP WITH DR. Derrel Nip AS NEEDED.  These are the goals we discussed: Goals    . Increase Vegetable Intake          Eat more vegetables when dining out and at home.       This is a list of the screening recommended for you and due dates:  Health Maintenance  Topic Date Due  . Tetanus Vaccine  02/15/2023  . Flu Shot  Completed  . DEXA scan (bone density measurement)  Completed  . Shingles Vaccine  Completed  . Pneumonia vaccines  Completed    Fall Prevention in the Home  Falls can cause injuries. They can happen to people of all ages. There are many things you can do to make your home safe and to help prevent falls.  WHAT CAN I DO ON THE OUTSIDE OF MY HOME?  Regularly fix the edges of walkways and driveways and fix any cracks.  Remove anything that might make you trip as you walk through a door, such as a raised step or threshold.  Trim any bushes or trees on the path to your home.  Use bright outdoor lighting.  Clear any walking paths of anything that might make someone trip, such as rocks or tools.  Regularly check to see if handrails are loose or broken. Make sure that both sides of any steps have handrails.  Any raised decks and porches should have guardrails on the edges.  Have any leaves, snow, or ice cleared regularly.  Use sand or salt on walking paths during winter.  Clean up any spills in your garage right away. This includes oil or grease spills. WHAT CAN I DO IN THE BATHROOM?   Use night lights.  Install grab bars by the toilet and in the tub and shower. Do not use towel bars as grab bars.  Use non-skid mats or decals in the tub or shower.  If you need to sit down in the shower, use a plastic, non-slip  stool.  Keep the floor dry. Clean up any water that spills on the floor as soon as it happens.  Remove soap buildup in the tub or shower regularly.  Attach bath mats securely with double-sided non-slip rug tape.  Do not have throw rugs and other things on the floor that can make you trip. WHAT CAN I DO IN THE BEDROOM?  Use night lights.  Make sure that you have a light by your bed that is easy to reach.  Do not use any sheets or blankets that are too big for your bed. They should not hang down onto the floor.  Have a firm chair that has side arms. You can use this for support while you get dressed.  Do not have throw rugs and other things on the floor that can make you trip. WHAT CAN I DO IN THE KITCHEN?  Clean up any spills right away.  Avoid walking on wet floors.  Keep items that you use a lot in easy-to-reach places.  If you need to reach something above you, use a strong step stool that has a grab bar.  Keep electrical cords out of the way.  Do not use floor polish or wax that makes floors  slippery. If you must use wax, use non-skid floor wax.  Do not have throw rugs and other things on the floor that can make you trip. WHAT CAN I DO WITH MY STAIRS?  Do not leave any items on the stairs.  Make sure that there are handrails on both sides of the stairs and use them. Fix handrails that are broken or loose. Make sure that handrails are as long as the stairways.  Check any carpeting to make sure that it is firmly attached to the stairs. Fix any carpet that is loose or worn.  Avoid having throw rugs at the top or bottom of the stairs. If you do have throw rugs, attach them to the floor with carpet tape.  Make sure that you have a light switch at the top of the stairs and the bottom of the stairs. If you do not have them, ask someone to add them for you. WHAT ELSE CAN I DO TO HELP PREVENT FALLS?  Wear shoes that:  Do not have high heels.  Have rubber bottoms.  Are  comfortable and fit you well.  Are closed at the toe. Do not wear sandals.  If you use a stepladder:  Make sure that it is fully opened. Do not climb a closed stepladder.  Make sure that both sides of the stepladder are locked into place.  Ask someone to hold it for you, if possible.  Clearly mark and make sure that you can see:  Any grab bars or handrails.  First and last steps.  Where the edge of each step is.  Use tools that help you move around (mobility aids) if they are needed. These include:  Canes.  Walkers.  Scooters.  Crutches.  Turn on the lights when you go into a dark area. Replace any light bulbs as soon as they burn out.  Set up your furniture so you have a clear path. Avoid moving your furniture around.  If any of your floors are uneven, fix them.  If there are any pets around you, be aware of where they are.  Review your medicines with your doctor. Some medicines can make you feel dizzy. This can increase your chance of falling. Ask your doctor what other things that you can do to help prevent falls.   This information is not intended to replace advice given to you by your health care provider. Make sure you discuss any questions you have with your health care provider.   Document Released: 09/16/2009 Document Revised: 04/06/2015 Document Reviewed: 12/25/2014 Elsevier Interactive Patient Education [employer].

## 2016-10-11 NOTE — Progress Notes (Signed)
Subjective:   Pamela Paul is a 82 y.o. female who presents for Medicare Annual (Subsequent) preventive examination.  Review of Systems:  No ROS.  Medicare Wellness Visit.  Cardiac Risk Factors include: advanced age (>40men, >64 women);hypertension     Objective:     Vitals: BP 122/78 (BP Location: Left Arm, Patient Position: Sitting, Cuff Size: Normal)   Pulse 76   Temp 97.8 F (36.6 C) (Oral)   Resp 14   Ht 5\' 6"  (1.676 m)   Wt 190 lb 6.4 oz (86.4 kg)   SpO2 98%   BMI 30.73 kg/m   Body mass index is 30.73 kg/m.   Tobacco History  Smoking Status  . Former Smoker  . Quit date: 03/15/1961  Smokeless Tobacco  . Never Used    Comment: Quit many years ago.     Counseling given: Not Answered   Past Medical History:  Diagnosis Date  . Heart disease    mitral valvular  . Hyperlipidemia   . Hypertension   . Hypothyroidism   . Spinal stenosis of lumbar region    s/p surgery   Past Surgical History:  Procedure Laterality Date  . ABDOMINAL HYSTERECTOMY    . bilateral cataract surgery    . CHOLECYSTECTOMY  1980  . JOINT REPLACEMENT     Bilateral knee  . LAPAROSCOPIC SIGMOID COLECTOMY  2011   secondary to benign mass  . REPLACEMENT TOTAL KNEE BILATERAL    . SMALL INTESTINE SURGERY  2010   for SBO  . SPINE SURGERY  2006   [facility], [facility]  . tendon repair     achille heall, left    Family History  Problem Relation Age of Onset  . Arthritis Mother   . Stroke Mother   . Hyperlipidemia Father   . Cancer Father   . Heart disease Paternal Aunt    History  Sexual Activity  . Sexual activity: No    Outpatient Encounter Prescriptions as of 10/11/2016  Medication Sig  . aspirin 81 MG tablet Take 160 mg by mouth daily.  . enalapril (VASOTEC) 10 MG tablet Take 10 mg by mouth daily.   Marland Kitchen levothyroxine (SYNTHROID, LEVOTHROID) 50 MCG tablet TAKE ONE TABLET BY MOUTH ONCE DAILY  . Multiple Vitamins-Minerals (PRESERVISION AREDS PO) Take 2 tablets by mouth daily.     No facility-administered encounter medications on file as of 10/11/2016.     Activities of Daily Living In your present state of health, do you have any difficulty performing the following activities: 10/11/2016  Hearing? Y  Vision? N  Difficulty concentrating or making decisions? N  Walking or climbing stairs? Y  Dressing or bathing? N  Doing errands, shopping? N  Preparing Food and eating ? N  Using the Toilet? N  In the past six months, have you accidently leaked urine? N  Do you have problems with loss of bowel control? N  Managing your Medications? N  Managing your Finances? N  Housekeeping or managing your Housekeeping? N  Some recent data might be hidden    Patient Care Team: [name], MD as PCP - General (Internal Medicine)    Assessment:    This is a routine wellness examination for [place]. The goal of the wellness visit is to assist the patient how to close the gaps in care and create a preventative care plan for the patient.   Osteoporosis risk reviewed.  Medications reviewed; taking without issues or barriers.  Safety issues reviewed; lives with [facility]  at [facility].  Life alert and smoke detectors in the home. No firearms in the home. Wears seatbelts when driving or riding with others. No violence in the home.  No identified risk were noted; The patient was oriented x 3; appropriate in dress and manner and no objective failures at ADL's or IADL's. Independent with cane in use outside of the home; walker in use inside the home.  Body mass index; discussed the importance of a healthy diet, water intake and exercise. Educational material provided.  Health maintenance gaps; closed.  Patient Concerns: None at this time. Follow up with PCP as needed.  Exercise Activities and Dietary recommendations Current Exercise Habits: Home exercise routine (Swimming 3  days a week. Nu step program.), Type of exercise: stretching;calisthenics, Time (Minutes):  60, Frequency (Times/Week): 3, Weekly Exercise (Minutes/Week): 180, Intensity: Mild  Goals    . Increase Vegetable Intake          Eat more vegetables when dining out and at home.      Fall Risk Fall Risk  10/11/2016 04/26/2015 10/26/2014  Falls in the past year? Yes No No  Number falls in past yr: 1 - -  Injury with Fall? Yes - -  Risk Factor Category  High Fall Risk - -  Risk for fall due to : Impaired balance/gait - -  Follow up Falls prevention discussed;Education provided - -   Depression Screen PHQ 2/9 Scores 10/11/2016 04/26/2015 10/26/2014  PHQ - 2 Score 0 0 0     Cognitive Function MMSE - Mini Mental State Exam 10/11/2016  Orientation to time 5  Orientation to Place 5  Registration 3  Attention/ Calculation 5  Recall 3  Language- name 2 objects 2  Language- repeat 1  Language- follow 3 step command 3  Language- read & follow direction 1  Write a sentence 1  Copy design 1  Total score 30     6CIT Screen 10/11/2016  What Year? 0 points  What month? 0 points  What time? 0 points  Count back from 20 0 points  Months in reverse 0 points  Repeat phrase 0 points  Total Score 0    Immunization History  Administered Date(s) Administered  . Influenza Split 10/03/2014, 10/04/2015  . Influenza-Unspecified 09/03/2013, 10/01/2016  . Pneumococcal Conjugate-13 10/26/2014  . Pneumococcal Polysaccharide-23 09/20/2008, 01/28/2016  . Tdap 02/14/2013  . Zoster 12/08/2009   Screening Tests Health Maintenance  Topic Date Due  . TETANUS/TDAP  02/15/2023  . INFLUENZA VACCINE  Completed  . DEXA SCAN  Completed  . ZOSTAVAX  Completed  . PNA vac Low Risk Adult  Completed      Plan:    End of life planning; Advance aging; Advanced directives discussed. Copy of current HCPOA/Living Will on file.  Medicare Attestation I have personally reviewed: The patient's medical and social history Their use of alcohol, tobacco or illicit drugs Their current medications and  supplements The patient's functional ability including ADLs,fall risks, home safety risks, cognitive, and hearing and visual impairment Diet and physical activities Evidence for depression   The patient's weight, height, BMI, and visual acuity have been recorded in the chart.  I have made referrals and provided education to the patient based on review of the above and I have provided the patient with a written personalized care plan for preventive services.    During the course of the visit the patient was educated and counseled about the following appropriate screening and preventive services:   Vaccines  to include Pneumoccal, Influenza, Hepatitis B, Td, Zostavax, HCV  Electrocardiogram  Cardiovascular Disease  Colorectal cancer screening  Bone density screening  Diabetes screening  Glaucoma screening  Mammography/PAP  Nutrition counseling   Patient Instructions (the written plan) was given to the patient.   Varney Biles, LPN  05/06/[id number]

## 2016-10-11 NOTE — Assessment & Plan Note (Signed)
Thyroid function is WNL on current dose.  No current changes needed.   Lab Results  Component Value Date   TSH 1.95 10/10/2016

## 2016-10-12 ENCOUNTER — Encounter: Payer: Self-pay | Admitting: *Deleted

## 2016-10-15 NOTE — Progress Notes (Signed)
  I have reviewed the above information and agree with above.   Sinead Hockman, MD 

## 2016-11-23 DIAGNOSIS — E2839 Other primary ovarian failure: Secondary | ICD-10-CM | POA: Diagnosis not present

## 2016-11-23 LAB — HM DEXA SCAN: HM Dexa Scan: NORMAL

## 2016-11-30 ENCOUNTER — Telehealth: Payer: Self-pay | Admitting: Internal Medicine

## 2016-11-30 NOTE — Telephone Encounter (Signed)
LMOM for patient to callback about Bone density results

## 2016-11-30 NOTE — Telephone Encounter (Signed)
DEXA scan was normal.  No osteoporosis.  Continue 1200 mg calcium daily through diet and supplements,. AND  1000 IUS of Vit D3 daily along with  regular weight bearing exercise.

## 2016-12-05 ENCOUNTER — Telehealth: Payer: Self-pay | Admitting: Internal Medicine

## 2016-12-05 NOTE — Telephone Encounter (Signed)
Please advise I do not see where the dexa scan was resulted?

## 2016-12-05 NOTE — Telephone Encounter (Signed)
Pt called requesting results for her bone density. Please advise, thank you!  Call pt @ [phone]

## 2016-12-06 NOTE — Telephone Encounter (Signed)
I do not have the results yet

## 2016-12-06 NOTE — Telephone Encounter (Signed)
Called patient left message to call office so patient can be advised we do not have the results of DEXA scan yet.

## 2016-12-07 NOTE — Telephone Encounter (Signed)
Patient was notified results will be given as soon as received.

## 2016-12-07 NOTE — Telephone Encounter (Signed)
Pt called back returning your call regarding results. Thank you!

## 2017-01-12 ENCOUNTER — Ambulatory Visit (INDEPENDENT_AMBULATORY_CARE_PROVIDER_SITE_OTHER): Payer: Medicare Other | Admitting: Internal Medicine

## 2017-01-12 ENCOUNTER — Encounter: Payer: Self-pay | Admitting: Internal Medicine

## 2017-01-12 VITALS — BP 130/72 | HR 62 | Resp 16 | Wt 187.0 lb

## 2017-01-12 DIAGNOSIS — N183 Chronic kidney disease, stage 3 unspecified: Secondary | ICD-10-CM

## 2017-01-12 DIAGNOSIS — E7800 Pure hypercholesterolemia, unspecified: Secondary | ICD-10-CM | POA: Diagnosis not present

## 2017-01-12 DIAGNOSIS — E559 Vitamin D deficiency, unspecified: Secondary | ICD-10-CM

## 2017-01-12 DIAGNOSIS — S42291S Other displaced fracture of upper end of right humerus, sequela: Secondary | ICD-10-CM | POA: Diagnosis not present

## 2017-01-12 DIAGNOSIS — F4320 Adjustment disorder, unspecified: Secondary | ICD-10-CM

## 2017-01-12 DIAGNOSIS — I10 Essential (primary) hypertension: Secondary | ICD-10-CM | POA: Diagnosis not present

## 2017-01-12 DIAGNOSIS — F4321 Adjustment disorder with depressed mood: Secondary | ICD-10-CM

## 2017-01-12 DIAGNOSIS — E034 Atrophy of thyroid (acquired): Secondary | ICD-10-CM

## 2017-01-12 LAB — CBC WITH DIFFERENTIAL/PLATELET
Basophils Absolute: 0.1 10*3/uL (ref 0.0–0.1)
Basophils Relative: 1.9 % (ref 0.0–3.0)
Eosinophils Absolute: 0.1 10*3/uL (ref 0.0–0.7)
Eosinophils Relative: 1.9 % (ref 0.0–5.0)
HCT: 37.4 % (ref 36.0–46.0)
Hemoglobin: 12.8 g/dL (ref 12.0–15.0)
Lymphocytes Relative: 32 % (ref 12.0–46.0)
Lymphs Abs: 2.1 10*3/uL (ref 0.7–4.0)
MCHC: 34.2 g/dL (ref 30.0–36.0)
MCV: 95.8 fl (ref 78.0–100.0)
Monocytes Absolute: 0.5 10*3/uL (ref 0.1–1.0)
Monocytes Relative: 7.9 % (ref 3.0–12.0)
Neutro Abs: 3.8 10*3/uL (ref 1.4–7.7)
Neutrophils Relative %: 56.3 % (ref 43.0–77.0)
Platelets: 302 10*3/uL (ref 150.0–400.0)
RBC: 3.91 Mil/uL (ref 3.87–5.11)
RDW: 12.4 % (ref 11.5–15.5)
WBC: 6.7 10*3/uL (ref 4.0–10.5)

## 2017-01-12 LAB — LIPID PANEL
Cholesterol: 197 mg/dL (ref 0–200)
HDL: 53.1 mg/dL (ref >39.00)
LDL Cholesterol: 107 mg/dL — ABNORMAL HIGH (ref 0–99)
NonHDL: 143.93
Total CHOL/HDL Ratio: 4
Triglycerides: 184 mg/dL — ABNORMAL HIGH (ref 0.0–149.0)
VLDL: 36.8 mg/dL (ref 0.0–40.0)

## 2017-01-12 LAB — COMPREHENSIVE METABOLIC PANEL
ALT: 17 U/L (ref 0–35)
AST: 23 U/L (ref 0–37)
Albumin: 4.3 g/dL (ref 3.5–5.2)
Alkaline Phosphatase: 72 U/L (ref 39–117)
BUN: 20 mg/dL (ref 6–23)
CO2: 26 mEq/L (ref 19–32)
Calcium: 9.6 mg/dL (ref 8.4–10.5)
Chloride: 104 mEq/L (ref 96–112)
Creatinine, Ser: 1.3 mg/dL — ABNORMAL HIGH (ref 0.40–1.20)
GFR: 41.01 mL/min — ABNORMAL LOW (ref >60.00)
Glucose, Bld: 96 mg/dL (ref 70–99)
Potassium: 4.5 mEq/L (ref 3.5–5.1)
Sodium: 138 mEq/L (ref 135–145)
Total Bilirubin: 0.9 mg/dL (ref 0.2–1.2)
Total Protein: 7.5 g/dL (ref 6.0–8.3)

## 2017-01-12 LAB — TSH: TSH: 2.16 u[IU]/mL (ref 0.35–4.50)

## 2017-01-12 LAB — VITAMIN D 25 HYDROXY (VIT D DEFICIENCY, FRACTURES): VITD: 18.54 ng/mL — ABNORMAL LOW (ref 30.00–100.00)

## 2017-01-12 NOTE — Patient Instructions (Signed)
I am so sorry about your recent loss,  I know the next few months without Pamela Paul may be a big adjustment for you.  Let me know if you feel you are not doing well.    The ShingRx vaccine will be available in about 6 months and IS ADVISED for all interested adults over 50 to prevent shingles .  Labs today , should be available in 2-3 days

## 2017-01-12 NOTE — Progress Notes (Signed)
Pre visit review using our clinic review tool, if applicable. No additional management support is needed unless otherwise documented below in the visit note. 

## 2017-01-12 NOTE — Progress Notes (Signed)
Subjective:  Patient ID: Pamela Paul, female    DOB: [birth year]  Age: 83 y.o. MRN: [id number]  CC: The primary encounter diagnosis was CKD (chronic kidney disease) stage 3, GFR 30-59 ml/min. Diagnoses of Pure hypercholesterolemia, Hypothyroidism due to acquired atrophy of thyroid, Essential hypertension, Vitamin D deficiency, Other closed displaced fracture of proximal end of right humerus, sequela, and Grief were also pertinent to this visit.  HPI Pamela Paul presents for  follow up on hypertension, CKD, hypothyroidism and  history of humeral fracture June 2017.  Last seen in November.  She has been grieiving the loss of her husband Ron who  died of pneumonia falling a hip fracture in dec 2017 .  Her children have been staying with her since his death and have been very supportive  .  She has not changed her routine  Because she finds comfort in familiarity, still getting up at 5:30 to swim daily   Sees eye doctor this month,  For macular degeneration.  despite her gradual loss of vision she continues to drive during the day and has not had any accidents.  She avoids night driving   She underwent screening for osteoporosis after her humeral fracture.  Reviewed her recent Normal DEXA DEC 2017 with her today    Wellness visit was done nov 2017 .Marland KitchenPatient is taking her medications as prescribed and notes no adverse effects.  Home BP readings have been done about once per week and are  generally < 130/80 .  She is avoiding added salt in her diet and swimming  regularly about 6 times per week for exercise  .  Fasting labs today    Outpatient Medications Prior to Visit  Medication Sig Dispense Refill  . aspirin 81 MG tablet Take 160 mg by mouth daily.    . enalapril (VASOTEC) 10 MG tablet Take 10 mg by mouth daily.     Marland Kitchen levothyroxine (SYNTHROID, LEVOTHROID) 50 MCG tablet TAKE ONE TABLET BY MOUTH ONCE DAILY 90 tablet 2  . Multiple Vitamins-Minerals (PRESERVISION AREDS PO) Take 2 tablets  by mouth daily.      No facility-administered medications prior to visit.     Review of Systems;  Patient denies headache, fevers, malaise, unintentional weight loss, skin rash, eye pain, sinus congestion and sinus pain, sore throat, dysphagia,  hemoptysis , cough, dyspnea, wheezing, chest pain, palpitations, orthopnea, edema, abdominal pain, nausea, melena, diarrhea, constipation, flank pain, dysuria, hematuria, urinary  Frequency, nocturia, numbness, tingling, seizures,  Focal weakness, Loss of consciousness,  Tremor, insomnia, depression, anxiety, and suicidal ideation.      Objective:  BP 130/72   Pulse 62   Resp 16   Wt 187 lb (84.8 kg)   SpO2 97%   BMI 30.18 kg/m   BP Readings from Last 3 Encounters:  01/12/17 130/72  10/11/16 122/78  10/10/16 132/78    Wt Readings from Last 3 Encounters:  01/12/17 187 lb (84.8 kg)  10/11/16 190 lb 6.4 oz (86.4 kg)  10/10/16 189 lb 8 oz (86 kg)    General appearance: alert, cooperative and appears stated age Ears: normal TM's and external ear canals both ears Throat: lips, mucosa, and tongue normal; teeth and gums normal Neck: no adenopathy, no carotid bruit, supple, symmetrical, trachea midline and thyroid not enlarged, symmetric, no tenderness/mass/nodules Back: symmetric, no curvature. ROM normal. No CVA tenderness. Lungs: clear to auscultation bilaterally Heart: regular rate and rhythm, S1, S2 normal, no murmur, click, rub or gallop Abdomen: soft, non-tender; bowel sounds  normal; no masses,  no organomegaly Pulses: 2+ and symmetric Skin: Skin color, texture, turgor normal. No rashes or lesions Lymph nodes: Cervical, supraclavicular, and axillary nodes normal.  No results found for: HGBA1C  Lab Results  Component Value Date   CREATININE 1.30 (H) 01/12/2017   CREATININE 1.40 (H) 01/28/2016   CREATININE 1.51 (H) 04/26/2015    Lab Results  Component Value Date   WBC 6.7 01/12/2017   HGB 12.8 01/12/2017   HCT 37.4  01/12/2017   PLT 302.0 01/12/2017   GLUCOSE 96 01/12/2017   CHOL 197 01/12/2017   TRIG 184.0 (H) 01/12/2017   HDL 53.10 01/12/2017   LDLDIRECT 157.0 10/10/2016   LDLCALC 107 (H) 01/12/2017   ALT 17 01/12/2017   AST 23 01/12/2017   NA 138 01/12/2017   K 4.5 01/12/2017   CL 104 01/12/2017   CREATININE 1.30 (H) 01/12/2017   BUN 20 01/12/2017   CO2 26 01/12/2017   TSH 2.16 01/12/2017    Ct Abdomen Wo Contrast  Result Date: 06/30/2015 CLINICAL DATA:  Recent abnormal ultrasound with left renal mass EXAM: CT ABDOMEN WITHOUT CONTRAST TECHNIQUE: Multidetector CT imaging of the abdomen was performed following the standard protocol without IV contrast. COMPARISON:  05/16/2009, by report from recent ultrasound dated 06/16/2015 FINDINGS: Lung bases are well aerated with mild scarring in the lower lobe stable from the prior exam. The gallbladder has been surgically removed. The liver, spleen, adrenal glands and pancreas are within normal limits. Kidneys are well visualized and demonstrate a few tiny nonobstructing renal stones on the right. No obstructive changes are noted. In the upper pole of the left kidney laterally there is an exophytic hypodense lesion measuring 4.3 by 3.3 cm in greatest dimension. It demonstrates some peripheral calcification and relatively fluid attenuation. No definitive solid component is noted. When compare with the prior CT examination from 2010 this lesion was present but measured only 3.5 cm in greatest dimension. Fecal material is noted within the colon. No focal mass lesion is seen. Aortoiliac calcifications are noted without aneurysmal dilatation. Degenerative and postoperative changes of the lumbar spine are seen. IMPRESSION: Upper pole left renal cyst with slight increase in size from 2010 as described. This corresponds to the abnormality seen on recent ultrasound. No other acute abnormality is noted. Electronically Signed   By: Inez Catalina M.D.   On: 06/30/2015 09:50     Assessment & Plan:   Problem List Items Addressed This Visit    CKD (chronic kidney disease) stage 3, GFR 30-59 ml/min - Primary    Renal function is at  baseline with avoidance of NSAIDs.  She is on an ACE  for control of hypertension.    Lab Results  Component Value Date   NA 138 01/12/2017   K 4.5 01/12/2017   CL 104 01/12/2017   CO2 26 01/12/2017   Lab Results  Component Value Date   CREATININE 1.30 (H) 01/12/2017         Relevant Orders   CBC with Differential/Platelet (Completed)   Grief    Her husband Ron died of pneumonia following a hip fracture in dec 2017. Patient i has adequate coping skills and emotional support .  i have asked patient to return in 3 months  to examine for signs of unresolving grief.       Hyperlipidemia    LDL has been at  goal without statin therapy , and elevation of triglycerides has been addressed with low glycemic index diet and Exercise .  Given her age,  No statin therapy advised  Lab Results  Component Value Date   CHOL 197 01/12/2017   HDL 53.10 01/12/2017   LDLCALC 107 (H) 01/12/2017   LDLDIRECT 157.0 10/10/2016   TRIG 184.0 (H) 01/12/2017   CHOLHDL 4 01/12/2017   Lab Results  Component Value Date   ALT 17 01/12/2017   AST 23 01/12/2017   ALKPHOS 72 01/12/2017   BILITOT 0.9 01/12/2017                Relevant Orders   Lipid panel (Completed)   Hypertension    Well controlled on current regimen. Renal function stable, no changes today.  Lab Results  Component Value Date   CREATININE 1.30 (H) 01/12/2017   Lab Results  Component Value Date   NA 138 01/12/2017   K 4.5 01/12/2017   CL 104 01/12/2017   CO2 26 01/12/2017         Relevant Orders   Comprehensive metabolic panel (Completed)   Hypothyroidism    Thyroid function is WNL on current dose.  No current changes needed.   Lab Results  Component Value Date   TSH 2.16 01/12/2017         Relevant Orders   TSH (Completed)   Proximal  humeral fracture    Secondary to fall.  She has  had an assessment for osteoporosis with DEXA which was normal December 2017. Discussed the current controversies surrounding the risks and benefits of calcium supplementation.  Encouraged her to increase dietary calcium through natural foods including almond/coconut milk  .       Other Visit Diagnoses    Vitamin D deficiency       Relevant Orders   VITAMIN D 25 Hydroxy (Vit-D Deficiency, Fractures) (Completed)     A total of 25 minutes of face to face time was spent with patient more than half of which was spent in counselling about the above mentioned conditions  and coordination of care   I am having Ms. Willenbring maintain her aspirin, Multiple Vitamins-Minerals (PRESERVISION AREDS PO), levothyroxine, and enalapril.  No orders of the defined types were placed in this encounter.   There are no discontinued medications.  Follow-up: Return in about 6 months (around 07/12/2017).   [name], MD

## 2017-01-14 ENCOUNTER — Other Ambulatory Visit: Payer: Self-pay | Admitting: Internal Medicine

## 2017-01-14 DIAGNOSIS — F4321 Adjustment disorder with depressed mood: Secondary | ICD-10-CM | POA: Insufficient documentation

## 2017-01-14 DIAGNOSIS — E559 Vitamin D deficiency, unspecified: Secondary | ICD-10-CM

## 2017-01-14 DIAGNOSIS — Z634 Disappearance and death of family member: Secondary | ICD-10-CM | POA: Insufficient documentation

## 2017-01-14 MED ORDER — ERGOCALCIFEROL 1.25 MG (50000 UT) PO CAPS: 50000.0000 [IU] | ORAL_CAPSULE | ORAL | 2 refills | Status: DC

## 2017-01-14 NOTE — Assessment & Plan Note (Signed)
LDL has been at  goal without statin therapy , and elevation of triglycerides has been addressed with low glycemic index diet and Exercise .  Given her age,  No statin therapy advised  Lab Results  Component Value Date   CHOL 197 01/12/2017   HDL 53.10 01/12/2017   LDLCALC 107 (H) 01/12/2017   LDLDIRECT 157.0 10/10/2016   TRIG 184.0 (H) 01/12/2017   CHOLHDL 4 01/12/2017   Lab Results  Component Value Date   ALT 17 01/12/2017   AST 23 01/12/2017   ALKPHOS 72 01/12/2017   BILITOT 0.9 01/12/2017

## 2017-01-14 NOTE — Assessment & Plan Note (Signed)
Secondary to fall.  She has  had an assessment for osteoporosis with DEXA which was normal December 2017. Discussed the current controversies surrounding the risks and benefits of calcium supplementation.  Encouraged her to increase dietary calcium through natural foods including almond/coconut milk  .

## 2017-01-14 NOTE — Assessment & Plan Note (Signed)
Thyroid function is WNL on current dose.  No current changes needed.   Lab Results  Component Value Date   TSH 2.16 01/12/2017

## 2017-01-14 NOTE — Assessment & Plan Note (Signed)
Renal function is at  baseline with avoidance of NSAIDs.  She is on an ACE  for control of hypertension.    Lab Results  Component Value Date   NA 138 01/12/2017   K 4.5 01/12/2017   CL 104 01/12/2017   CO2 26 01/12/2017   Lab Results  Component Value Date   CREATININE 1.30 (H) 01/12/2017

## 2017-01-14 NOTE — Assessment & Plan Note (Signed)
Her husband Ron died of pneumonia following a hip fracture in dec 2017. Patient i has adequate coping skills and emotional support .  i have asked patient to return in 3 months  to examine for signs of unresolving grief.

## 2017-01-14 NOTE — Assessment & Plan Note (Signed)
Well controlled on current regimen. Renal function stable, no changes today.  Lab Results  Component Value Date   CREATININE 1.30 (H) 01/12/2017   Lab Results  Component Value Date   NA 138 01/12/2017   K 4.5 01/12/2017   CL 104 01/12/2017   CO2 26 01/12/2017

## 2017-01-19 DIAGNOSIS — H903 Sensorineural hearing loss, bilateral: Secondary | ICD-10-CM | POA: Diagnosis not present

## 2017-01-19 DIAGNOSIS — Z461 Encounter for fitting and adjustment of hearing aid: Secondary | ICD-10-CM | POA: Diagnosis not present

## 2017-01-22 DIAGNOSIS — E559 Vitamin D deficiency, unspecified: Secondary | ICD-10-CM | POA: Diagnosis not present

## 2017-01-22 DIAGNOSIS — N2581 Secondary hyperparathyroidism of renal origin: Secondary | ICD-10-CM | POA: Diagnosis not present

## 2017-01-22 DIAGNOSIS — I129 Hypertensive chronic kidney disease with stage 1 through stage 4 chronic kidney disease, or unspecified chronic kidney disease: Secondary | ICD-10-CM | POA: Diagnosis not present

## 2017-01-22 DIAGNOSIS — N183 Chronic kidney disease, stage 3 (moderate): Secondary | ICD-10-CM | POA: Diagnosis not present

## 2017-01-25 ENCOUNTER — Other Ambulatory Visit: Payer: Self-pay | Admitting: Internal Medicine

## 2017-01-25 DIAGNOSIS — E785 Hyperlipidemia, unspecified: Secondary | ICD-10-CM | POA: Insufficient documentation

## 2017-01-25 DIAGNOSIS — H353130 Nonexudative age-related macular degeneration, bilateral, stage unspecified: Secondary | ICD-10-CM | POA: Diagnosis not present

## 2017-01-25 DIAGNOSIS — H43813 Vitreous degeneration, bilateral: Secondary | ICD-10-CM | POA: Diagnosis not present

## 2017-01-25 DIAGNOSIS — M48061 Spinal stenosis, lumbar region without neurogenic claudication: Secondary | ICD-10-CM | POA: Insufficient documentation

## 2017-01-25 DIAGNOSIS — I10 Essential (primary) hypertension: Secondary | ICD-10-CM | POA: Insufficient documentation

## 2017-01-25 DIAGNOSIS — H3589 Other specified retinal disorders: Secondary | ICD-10-CM | POA: Diagnosis not present

## 2017-07-12 ENCOUNTER — Ambulatory Visit (INDEPENDENT_AMBULATORY_CARE_PROVIDER_SITE_OTHER): Payer: Medicare Other | Admitting: Internal Medicine

## 2017-07-12 ENCOUNTER — Encounter: Payer: Self-pay | Admitting: Internal Medicine

## 2017-07-12 VITALS — BP 128/72 | HR 65 | Temp 98.0°F | Resp 15 | Wt 185.0 lb

## 2017-07-12 DIAGNOSIS — E7800 Pure hypercholesterolemia, unspecified: Secondary | ICD-10-CM | POA: Diagnosis not present

## 2017-07-12 DIAGNOSIS — E559 Vitamin D deficiency, unspecified: Secondary | ICD-10-CM

## 2017-07-12 DIAGNOSIS — E034 Atrophy of thyroid (acquired): Secondary | ICD-10-CM | POA: Diagnosis not present

## 2017-07-12 DIAGNOSIS — N183 Chronic kidney disease, stage 3 unspecified: Secondary | ICD-10-CM

## 2017-07-12 DIAGNOSIS — N281 Cyst of kidney, acquired: Secondary | ICD-10-CM

## 2017-07-12 DIAGNOSIS — Z1231 Encounter for screening mammogram for malignant neoplasm of breast: Secondary | ICD-10-CM | POA: Diagnosis not present

## 2017-07-12 DIAGNOSIS — I10 Essential (primary) hypertension: Secondary | ICD-10-CM | POA: Diagnosis not present

## 2017-07-12 DIAGNOSIS — Z1239 Encounter for other screening for malignant neoplasm of breast: Secondary | ICD-10-CM

## 2017-07-12 LAB — COMPREHENSIVE METABOLIC PANEL
ALT: 13 U/L (ref 0–35)
AST: 18 U/L (ref 0–37)
Albumin: 4.3 g/dL (ref 3.5–5.2)
Alkaline Phosphatase: 58 U/L (ref 39–117)
BUN: 28 mg/dL — ABNORMAL HIGH (ref 6–23)
CO2: 27 mEq/L (ref 19–32)
Calcium: 9.6 mg/dL (ref 8.4–10.5)
Chloride: 104 mEq/L (ref 96–112)
Creatinine, Ser: 1.56 mg/dL — ABNORMAL HIGH (ref 0.40–1.20)
GFR: 33.19 mL/min — ABNORMAL LOW (ref >60.00)
Glucose, Bld: 105 mg/dL — ABNORMAL HIGH (ref 70–99)
Potassium: 4.5 mEq/L (ref 3.5–5.1)
Sodium: 139 mEq/L (ref 135–145)
Total Bilirubin: 0.8 mg/dL (ref 0.2–1.2)
Total Protein: 7.4 g/dL (ref 6.0–8.3)

## 2017-07-12 LAB — VITAMIN D 25 HYDROXY (VIT D DEFICIENCY, FRACTURES): VITD: 30.4 ng/mL (ref 30.00–100.00)

## 2017-07-12 LAB — TSH: TSH: 2.73 u[IU]/mL (ref 0.35–4.50)

## 2017-07-12 LAB — LDL CHOLESTEROL, DIRECT: Direct LDL: 132 mg/dL

## 2017-07-12 NOTE — Progress Notes (Signed)
Subjective:  Patient ID: Pamela Paul, female    DOB: 28-Jan-1926  Age: 83 y.o. MRN: [id number]  CC: The primary encounter diagnosis was Vitamin D deficiency. Diagnoses of CKD (chronic kidney disease) stage 3, GFR 30-59 ml/min, Pure hypercholesterolemia, Hypothyroidism due to acquired atrophy of thyroid, Renal cyst, acquired, left, Essential hypertension, and Breast cancer screening were also pertinent to this visit.  HPI Pamela Paul presents for 6 month follow up  On hypertension ,  Hypothyroidism and CKD. Patient is taking her medications as prescribed and notes no adverse effects.  Home BP readings have been done about once per week and are  generally < 130/80 .  She is avoiding added salt in her diet and walking swimming and walking regularly about 5 times per week for exercise.  She states that she feels generally well,  Has had no recent falls.   Up to date on vacciness Declines mammogram     Lab Results  Component Value Date   WBC 6.7 01/12/2017   HGB 12.8 01/12/2017   HCT 37.4 01/12/2017   MCV 95.8 01/12/2017   PLT 302.0 01/12/2017     Outpatient Medications Prior to Visit  Medication Sig Dispense Refill  . aspirin 81 MG tablet Take 160 mg by mouth daily.    . enalapril (VASOTEC) 10 MG tablet Take 10 mg by mouth daily.     Marland Kitchen levothyroxine (SYNTHROID, LEVOTHROID) 50 MCG tablet TAKE ONE TABLET BY MOUTH ONCE DAILY 90 tablet 2  . Multiple Vitamins-Minerals (PRESERVISION AREDS PO) Take 2 tablets by mouth daily.     . ergocalciferol (DRISDOL) 50000 units capsule Take 1 capsule (50,000 Units total) by mouth once a week. (Patient not taking: Reported on 07/12/2017) 4 capsule 2   No facility-administered medications prior to visit.     Review of Systems;  Patient denies headache, fevers, malaise, unintentional weight loss, skin rash, eye pain, sinus congestion and sinus pain, sore throat, dysphagia,  hemoptysis , cough, dyspnea, wheezing, chest pain, palpitations, orthopnea,  edema, abdominal pain, nausea, melena, diarrhea, constipation, flank pain, dysuria, hematuria, urinary  Frequency, nocturia, numbness, tingling, seizures,  Focal weakness, Loss of consciousness,  Tremor, insomnia, depression, anxiety, and suicidal ideation.      Objective:  BP 128/72   Pulse 65   Temp 98 F (36.7 C) (Oral)   Resp 15   Wt 185 lb (83.9 kg)   LMP  (Exact Date)   SpO2 96%   BMI 29.86 kg/m   BP Readings from Last 3 Encounters:  07/12/17 128/72  01/12/17 130/72  10/11/16 122/78    Wt Readings from Last 3 Encounters:  07/12/17 185 lb (83.9 kg)  01/12/17 187 lb (84.8 kg)  10/11/16 190 lb 6.4 oz (86.4 kg)    General appearance: alert, cooperative and appears stated age Ears: normal TM's and external ear canals both ears Throat: lips, mucosa, and tongue normal; teeth and gums normal Neck: no adenopathy, no carotid bruit, supple, symmetrical, trachea midline and thyroid not enlarged, symmetric, no tenderness/mass/nodules Back: symmetric, no curvature. ROM normal. No CVA tenderness. Lungs: clear to auscultation bilaterally Heart: regular rate and rhythm, S1, S2 normal, no murmur, click, rub or gallop Abdomen: soft, non-tender; bowel sounds normal; no masses,  no organomegaly Pulses: 2+ and symmetric Skin: Skin color, texture, turgor normal. No rashes or lesions Lymph nodes: Cervical, supraclavicular, and axillary nodes normal.  No results found for: HGBA1C  Lab Results  Component Value Date   CREATININE 1.56 (H) 07/12/2017   CREATININE  1.30 (H) 01/12/2017   CREATININE 1.40 (H) 01/28/2016    Lab Results  Component Value Date   WBC 6.7 01/12/2017   HGB 12.8 01/12/2017   HCT 37.4 01/12/2017   PLT 302.0 01/12/2017   GLUCOSE 105 (H) 07/12/2017   CHOL 197 01/12/2017   TRIG 184.0 (H) 01/12/2017   HDL 53.10 01/12/2017   LDLDIRECT 132.0 07/12/2017   LDLCALC 107 (H) 01/12/2017   ALT 13 07/12/2017   AST 18 07/12/2017   NA 139 07/12/2017   K 4.5 07/12/2017    CL 104 07/12/2017   CREATININE 1.56 (H) 07/12/2017   BUN 28 (H) 07/12/2017   CO2 27 07/12/2017   TSH 2.73 07/12/2017    Ct Abdomen Wo Contrast  Result Date: 06/30/2015 CLINICAL DATA:  Recent abnormal ultrasound with left renal mass EXAM: CT ABDOMEN WITHOUT CONTRAST TECHNIQUE: Multidetector CT imaging of the abdomen was performed following the standard protocol without IV contrast. COMPARISON:  05/16/2009, by report from recent ultrasound dated 06/16/2015 FINDINGS: Lung bases are well aerated with mild scarring in the lower lobe stable from the prior exam. The gallbladder has been surgically removed. The liver, spleen, adrenal glands and pancreas are within normal limits. Kidneys are well visualized and demonstrate a few tiny nonobstructing renal stones on the right. No obstructive changes are noted. In the upper pole of the left kidney laterally there is an exophytic hypodense lesion measuring 4.3 by 3.3 cm in greatest dimension. It demonstrates some peripheral calcification and relatively fluid attenuation. No definitive solid component is noted. When compare with the prior CT examination from 2010 this lesion was present but measured only 3.5 cm in greatest dimension. Fecal material is noted within the colon. No focal mass lesion is seen. Aortoiliac calcifications are noted without aneurysmal dilatation. Degenerative and postoperative changes of the lumbar spine are seen. IMPRESSION: Upper pole left renal cyst with slight increase in size from 2010 as described. This corresponds to the abnormality seen on recent ultrasound. No other acute abnormality is noted. Electronically Signed   By: Inez Catalina M.D.   On: 06/30/2015 09:50    Assessment & Plan:   Problem List Items Addressed This Visit    Vitamin D deficiency - Primary   Relevant Orders   VITAMIN D 25 Hydroxy (Vit-D Deficiency, Fractures) (Completed)   Renal cyst, acquired, left    Progressive enlargement with serial CTs,  Last noted to be  4 cm.  Given her age ,  No further workup  Per Dr. Juleen China.         Hypothyroidism    Thyroid function is WNL on current dose.  No current changes needed.   Lab Results  Component Value Date   TSH 2.73 07/12/2017         Relevant Orders   TSH (Completed)   Hypertension    Well controlled on current regimen. Renal function stable, no changes today.  Lab Results  Component Value Date   CREATININE 1.56 (H) 07/12/2017   Lab Results  Component Value Date   NA 139 07/12/2017   K 4.5 07/12/2017   CL 104 07/12/2017   CO2 27 07/12/2017         Hyperlipidemia   Relevant Orders   LDL cholesterol, direct (Completed)   CKD (chronic kidney disease) stage 3, GFR 30-59 ml/min    Renal function is at  baseline with avoidance of NSAIDs.  She is on an ACE  for control of hypertension.    Lab Results  Component  Value Date   NA 139 07/12/2017   K 4.5 07/12/2017   CL 104 07/12/2017   CO2 27 07/12/2017   Lab Results  Component Value Date   CREATININE 1.56 (H) 07/12/2017         Relevant Orders   Comprehensive metabolic panel (Completed)   Breast cancer screening    She declines continued breast cancer screening with mammograms.        A total of 25 minutes of face to face time was spent with patient more than half of which was spent in counselling about the above mentioned conditions  and coordination of care   I have discontinued Ms. Heydt's ergocalciferol. I am also having her maintain her aspirin, Multiple Vitamins-Minerals (PRESERVISION AREDS PO), enalapril, and levothyroxine.  No orders of the defined types were placed in this encounter.   Medications Discontinued During This Encounter  Medication Reason  . ergocalciferol (DRISDOL) 50000 units capsule Therapy completed    Follow-up: Return in about 6 months (around 01/12/2018).   [name], MD

## 2017-07-12 NOTE — Patient Instructions (Addendum)
You are doing well!  I will refill your thyroid once I see today's results  Ask  Micheal  about the [place].  They are a great way to exercise arms and legs in the water   See you  In 6 months

## 2017-07-15 NOTE — Assessment & Plan Note (Signed)
Renal function is at  baseline with avoidance of NSAIDs.  She is on an ACE  for control of hypertension.    Lab Results  Component Value Date   NA 139 07/12/2017   K 4.5 07/12/2017   CL 104 07/12/2017   CO2 27 07/12/2017   Lab Results  Component Value Date   CREATININE 1.56 (H) 07/12/2017

## 2017-07-15 NOTE — Assessment & Plan Note (Signed)
Well controlled on current regimen. Renal function stable, no changes today.  Lab Results  Component Value Date   CREATININE 1.56 (H) 07/12/2017   Lab Results  Component Value Date   NA 139 07/12/2017   K 4.5 07/12/2017   CL 104 07/12/2017   CO2 27 07/12/2017

## 2017-07-15 NOTE — Assessment & Plan Note (Signed)
She declines continued breast cancer screening with mammograms.

## 2017-07-15 NOTE — Assessment & Plan Note (Signed)
Thyroid function is WNL on current dose.  No current changes needed.   Lab Results  Component Value Date   TSH 2.73 07/12/2017

## 2017-07-15 NOTE — Assessment & Plan Note (Signed)
Progressive enlargement with serial CTs,  Last noted to be 4 cm.  Given her age ,  No further workup  Per Dr. Juleen China.

## 2017-07-24 DIAGNOSIS — H43813 Vitreous degeneration, bilateral: Secondary | ICD-10-CM | POA: Diagnosis not present

## 2017-07-24 DIAGNOSIS — Z961 Presence of intraocular lens: Secondary | ICD-10-CM | POA: Diagnosis not present

## 2017-07-24 DIAGNOSIS — H353131 Nonexudative age-related macular degeneration, bilateral, early dry stage: Secondary | ICD-10-CM | POA: Diagnosis not present

## 2017-07-25 DIAGNOSIS — E875 Hyperkalemia: Secondary | ICD-10-CM | POA: Diagnosis not present

## 2017-07-25 DIAGNOSIS — I129 Hypertensive chronic kidney disease with stage 1 through stage 4 chronic kidney disease, or unspecified chronic kidney disease: Secondary | ICD-10-CM | POA: Diagnosis not present

## 2017-07-25 DIAGNOSIS — N183 Chronic kidney disease, stage 3 (moderate): Secondary | ICD-10-CM | POA: Diagnosis not present

## 2017-07-25 DIAGNOSIS — N2581 Secondary hyperparathyroidism of renal origin: Secondary | ICD-10-CM | POA: Diagnosis not present

## 2017-08-29 NOTE — Telephone Encounter (Signed)
Orders

## 2017-09-05 DIAGNOSIS — Z85828 Personal history of other malignant neoplasm of skin: Secondary | ICD-10-CM | POA: Diagnosis not present

## 2017-09-05 DIAGNOSIS — X32XXXA Exposure to sunlight, initial encounter: Secondary | ICD-10-CM | POA: Diagnosis not present

## 2017-09-05 DIAGNOSIS — L570 Actinic keratosis: Secondary | ICD-10-CM | POA: Diagnosis not present

## 2017-09-05 DIAGNOSIS — D225 Melanocytic nevi of trunk: Secondary | ICD-10-CM | POA: Diagnosis not present

## 2017-09-05 DIAGNOSIS — D2272 Melanocytic nevi of left lower limb, including hip: Secondary | ICD-10-CM | POA: Diagnosis not present

## 2017-09-05 DIAGNOSIS — D2261 Melanocytic nevi of right upper limb, including shoulder: Secondary | ICD-10-CM | POA: Diagnosis not present

## 2017-10-05 DIAGNOSIS — Z23 Encounter for immunization: Secondary | ICD-10-CM | POA: Diagnosis not present

## 2017-10-11 ENCOUNTER — Ambulatory Visit: Payer: Medicare Other

## 2017-11-02 ENCOUNTER — Ambulatory Visit (INDEPENDENT_AMBULATORY_CARE_PROVIDER_SITE_OTHER): Payer: Medicare Other

## 2017-11-02 VITALS — BP 120/70 | HR 66 | Temp 97.8°F | Resp 14 | Ht 66.0 in | Wt 170.8 lb

## 2017-11-02 DIAGNOSIS — Z0000 Encounter for general adult medical examination without abnormal findings: Secondary | ICD-10-CM | POA: Diagnosis not present

## 2017-11-02 DIAGNOSIS — Z1331 Encounter for screening for depression: Secondary | ICD-10-CM | POA: Diagnosis not present

## 2017-11-02 NOTE — Patient Instructions (Addendum)
  Pamela Paul , Thank you for taking time to come for your Medicare Wellness Visit. I appreciate your ongoing commitment to your health goals. Please review the following plan we discussed and let me know if I can assist you in the future.   Follow up with Dr. Derrel Nip as needed.    Have a great day!  These are the goals we discussed: Goals    . Increase physical activity     Walk for exercise       This is a list of the screening recommended for you and due dates:  Health Maintenance  Topic Date Due  . Flu Shot  07/04/2017  . Tetanus Vaccine  02/15/2023  . DEXA scan (bone density measurement)  Completed  . Pneumonia vaccines  Completed

## 2017-11-02 NOTE — Progress Notes (Signed)
Subjective:   Pamela Paul is a 83 y.o. female who presents for Medicare Annual (Subsequent) preventive examination.  Review of Systems:  No ROS.  Medicare Wellness Visit. Additional risk factors are reflected in the social history.  Cardiac Risk Factors include: advanced age (>69men, >72 women);hypertension     Objective:     Vitals: BP 120/70 (BP Location: Left Arm, Patient Position: Sitting, Cuff Size: Normal)   Pulse 66   Temp 97.8 F (36.6 C)   Resp 14   Ht 5\' 6"  (1.676 m)   Wt 170 lb 12.8 oz (77.5 kg)   BMI 27.57 kg/m   Body mass index is 27.57 kg/m.  Advanced Directives 11/02/2017 10/11/2016  Does Patient Have a Medical Advance Directive? Yes Yes  Type of Advance Directive Out of facility DNR (pink MOST or yellow form) [place];Living will  Does patient want to make changes to medical advance directive? No - Patient declined -  Copy of [place] in Chart? - Yes  Pre-existing out of facility DNR order (yellow form or pink MOST form) Yellow form placed in chart (order not valid for inpatient use) -    Tobacco Social History   Tobacco Use  Smoking Status Former Smoker  . Last attempt to quit: 03/15/1961  . Years since quitting: 56.6  Smokeless Tobacco Never Used  Tobacco Comment   Quit many years ago.     Counseling given: Not Answered Comment: Quit many years ago.   Clinical Intake:  Pre-visit preparation completed: Yes  Pain : No/denies pain     Nutritional Status: BMI 25 -29 Overweight Diabetes: No  Activities of Daily Living: Independent Ambulation: Independent with device- listed below Home Assistive Devices/Equipment: Angela Burke (specify Type) Medication Administration: Independent Home Management: Independent     Do you feel unsafe in your current relationship?: No Do you feel physically threatened by others?: No Anyone hurting you at home, work, or school?: No Unable to ask?: No  How often  do you need to have someone help you when you read instructions, pamphlets, or other written materials from your doctor or pharmacy?: 1 - Never  Interpreter Needed?: No     Past Medical History:  Diagnosis Date  . Heart disease    mitral valvular  . Hyperlipidemia   . Hypertension   . Hypothyroidism   . Spinal stenosis of lumbar region 2006   s/p surgery   Past Surgical History:  Procedure Laterality Date  . ABDOMINAL HYSTERECTOMY    . bilateral cataract surgery    . CHOLECYSTECTOMY  1980  . JOINT REPLACEMENT     Bilateral knee  . LAPAROSCOPIC SIGMOID COLECTOMY  2011   secondary to benign mass  . REPLACEMENT TOTAL KNEE BILATERAL    . SMALL INTESTINE SURGERY  2010   for SBO  . SPINE SURGERY  2006   [facility], [facility]  . tendon repair     achille heall, left    Family History  Problem Relation Age of Onset  . Arthritis Mother   . Stroke Mother   . Hyperlipidemia Father   . Cancer Father   . Heart disease Paternal Aunt    Social History   Socioeconomic History  . Marital status: Married    Spouse name: None  . Number of children: None  . Years of education: None  . Highest education level: None  Social Needs  . Financial resource strain: Not hard at all  . Food insecurity -  worry: Never true  . Food insecurity - inability: Never true  . Transportation needs - medical: None  . Transportation needs - non-medical: None  Occupational History  . None  Tobacco Use  . Smoking status: Former Smoker    Last attempt to quit: 03/15/1961    Years since quitting: 56.6  . Smokeless tobacco: Never Used  . Tobacco comment: Quit many years ago.  Substance and Sexual Activity  . Alcohol use: Yes    Comment: occasionally  . Drug use: No  . Sexual activity: No  Other Topics Concern  . None  Social History Narrative  . None    Outpatient Encounter Medications as of 11/02/2017  Medication Sig  . aspirin 81 MG tablet Take 160 mg by mouth daily.  . cholecalciferol (VITAMIN  D) 1000 units tablet Take 1,000 Units by mouth daily.  . enalapril (VASOTEC) 10 MG tablet Take 10 mg by mouth daily.   Marland Kitchen levothyroxine (SYNTHROID, LEVOTHROID) 50 MCG tablet TAKE ONE TABLET BY MOUTH ONCE DAILY  . Multiple Vitamins-Minerals (PRESERVISION AREDS PO) Take 2 tablets by mouth daily.    No facility-administered encounter medications on file as of 11/02/2017.     Activities of Daily Living In your present state of health, do you have any difficulty performing the following activities: 11/02/2017  Hearing? Y  Comment HOH.  Hearing aids, bilateral  Vision? N  Difficulty concentrating or making decisions? N  Walking or climbing stairs? Y  Comment Unsteady gate  Dressing or bathing? N  Doing errands, shopping? N  Preparing Food and eating ? N  Using the Toilet? N  In the past six months, have you accidently leaked urine? N  Do you have problems with loss of bowel control? N  Managing your Medications? N  Managing your Finances? N  Housekeeping or managing your Housekeeping? N  Some recent data might be hidden    Patient Care Team: [name], MD as PCP - General (Internal Medicine)    Assessment:    This is a routine wellness examination for Miles. The goal of the wellness visit is to assist the patient how to close the gaps in care and create a preventative care plan for the patient.   The roster of all physicians providing medical care to patient is listed in the Snapshot section of the chart.  Taking calcium VIT D as appropriate/Osteoporosis risk reviewed.    Safety issues reviewed; Smoke and carbon monoxide detectors in the home. No firearms in the home.  Wears seatbelts when driving or riding with others. Patient does wear sunscreen or protective clothing when in direct sunlight. No violence in the home.  Depression- PHQ 2 &9 complete.  No signs/symptoms or verbal communication regarding little pleasure in doing things, feeling down, depressed or hopeless.  No changes in sleeping, energy, eating, concentrating.  No thoughts of self harm or harm towards others.  Time spent on this topic is 8 minutes.   Patient is alert, normal appearance, oriented to person/place/and time. Correctly identified the president of the [place], recall of 3/3 words, and performing simple calculations. Displays appropriate judgement and can read correct time from watch face.   No new identified risk were noted.  No failures at ADL's or IADL's.  Ambulates with cane and/or walker when in and out of the home.  BMI- discussed the importance of a healthy diet, water intake and the benefits of aerobic exercise. Educational material provided.   24 hour diet recall: Low carb foods  Daily fluid intake: 1 cups of caffeine, 6 cups of water  Dental- every 6 months.  Eye- Visual acuity not assessed per patient preference since they have regular follow up with the ophthalmologist.  Wears corrective lenses.  Sleep patterns- Sleeps 8 hours at night.  Wakes feeling rested  Health maintenance gaps- closed.  Patient Concerns: None at this time. Follow up with PCP as needed.  Exercise Activities and Dietary recommendations Current Exercise Habits: Structured exercise class, Type of exercise: calisthenics;stretching(swimming), Time (Minutes): 60, Frequency (Times/Week): 3, Weekly Exercise (Minutes/Week): 180, Intensity: Moderate, Exercise limited by: Other - see comments  Goals    . Increase physical activity     Walk for exercise      Fall Risk Fall Risk  11/02/2017 10/11/2016 04/26/2015 10/26/2014  Falls in the past year? Yes Yes No No  Number falls in past yr: 1 1 - -  Injury with Fall? No Yes - -  Comment Walked on uneven ground - - -  Risk Factor Category  - High Fall Risk - -  Risk for fall due to : - Impaired balance/gait - -  Follow up Falls prevention discussed;Education provided Falls prevention discussed;Education provided - -  Comment Cane in use when out of the  home, walker in use within the home. - - -    Depression Screen PHQ 2/9 Scores 11/02/2017 10/11/2016 04/26/2015 10/26/2014  PHQ - 2 Score 0 0 0 0  PHQ- 9 Score 0 - - -     Cognitive Function MMSE - Mini Mental State Exam 10/11/2016  Orientation to time 5  Orientation to Place 5  Registration 3  Attention/ Calculation 5  Recall 3  Language- name 2 objects 2  Language- repeat 1  Language- follow 3 step command 3  Language- read & follow direction 1  Write a sentence 1  Copy design 1  Total score 30     6CIT Screen 11/02/2017 10/11/2016  What Year? 0 points 0 points  What month? 0 points 0 points  What time? 0 points 0 points  Count back from 20 0 points 0 points  Months in reverse 0 points 0 points  Repeat phrase 0 points 0 points  Total Score 0 0    Immunization History  Administered Date(s) Administered  . Influenza Split 10/03/2014, 10/04/2015  . Influenza-Unspecified 09/03/2013, 10/01/2016, 10/06/2017  . Pneumococcal Conjugate-13 10/26/2014  . Pneumococcal Polysaccharide-23 09/20/2008, 01/28/2016  . Tdap 02/14/2013  . Zoster 12/08/2009  . Zoster Recombinat (Shingrix) 02/07/2017, 06/18/2017   Screening Tests Health Maintenance  Topic Date Due  . INFLUENZA VACCINE  07/04/2017  . TETANUS/TDAP  02/15/2023  . DEXA SCAN  Completed  . PNA vac Low Risk Adult  Completed      Plan:    End of life planning; Advance aging; Advanced directives discussed. Copy of current DNR on file.    I have personally reviewed and noted the following in the patient's chart:   . Medical and social history . Use of alcohol, tobacco or illicit drugs  . Current medications and supplements . Functional ability and status . Nutritional status . Physical activity . Advanced directives . List of other physicians . Hospitalizations, surgeries, and ER visits in previous 12 months . Vitals . Screenings to include cognitive, depression, and falls . Referrals and appointments  In  addition, I have reviewed and discussed with patient certain preventive protocols, quality metrics, and best practice recommendations. A written personalized care plan for preventive services as well as  general preventive health recommendations were provided to patient.     OBrien-Blaney, Maxwel Meadowcroft L, LPN  11/55/2080   I have reviewed the above information and agree with above.   Deborra Medina, MD

## 2017-11-08 ENCOUNTER — Other Ambulatory Visit: Payer: Self-pay | Admitting: Internal Medicine

## 2017-12-11 ENCOUNTER — Encounter: Payer: Self-pay | Admitting: Family Medicine

## 2017-12-11 ENCOUNTER — Ambulatory Visit (INDEPENDENT_AMBULATORY_CARE_PROVIDER_SITE_OTHER): Payer: Medicare HMO | Admitting: Family Medicine

## 2017-12-11 ENCOUNTER — Other Ambulatory Visit: Payer: Self-pay

## 2017-12-11 DIAGNOSIS — J069 Acute upper respiratory infection, unspecified: Secondary | ICD-10-CM | POA: Diagnosis not present

## 2017-12-11 MED ORDER — BENZONATATE 200 MG PO CAPS
200.0000 mg | ORAL_CAPSULE | Freq: Two times a day (BID) | ORAL | 0 refills | Status: DC | PRN
Start: 2017-12-11 — End: 2018-01-16

## 2017-12-11 NOTE — Assessment & Plan Note (Signed)
Symptoms most consistent with viral upper respiratory infection.  She is staying well-hydrated.  Tessalon for cough.  Discussed Flonase or Claritin over-the-counter for symptom management. Will have CMA contact patient to advise every other day dosing for claritin if she decides to use this. Given return precautions. If she does not improve she will contact us.

## 2017-12-11 NOTE — Progress Notes (Signed)
Patient notified

## 2017-12-11 NOTE — Patient Instructions (Signed)
Nice to see you. You likely have a viral illness leading to her symptoms.  Can trial Flonase 1 spray each nostril daily over-the-counter or Claritin over the counter. I have sent in Tessalon for you to use for cough. If your symptoms worsen please let us know.  If you develop cough productive of blood or shortness of breath please be evaluated immediately.

## 2017-12-11 NOTE — Progress Notes (Signed)
  Pamela Rumps, MD Phone: [phone]  Pamela Paul is a 84 y.o. female who presents today for same-day visit.  Patient notes onset of symptoms 5 days ago.  Has had nasal congestion With Postnasal Drip.  Not Blowing Anything Significant Out Of Her Nose.  Not Coughing Anything up.  No Fevers.  Does Note Her Ears Have Been Crackling.  No Shortness of Breath.  She Has Tried Hot Tea with Honey and Energy C for Her Symptoms.  Social History   Tobacco Use  Smoking Status Former Smoker  . Last attempt to quit: 03/15/1961  . Years since quitting: 56.7  Smokeless Tobacco Never Used  Tobacco Comment   Quit many years ago.     ROS see history of present illness  Objective  Physical Exam Vitals:   12/11/17 1536  BP: 130/66  Pulse: 91  Temp: 98 F (36.7 C)  SpO2: 96%    BP Readings from Last 3 Encounters:  12/11/17 130/66  11/02/17 120/70  07/12/17 128/72   Wt Readings from Last 3 Encounters:  12/11/17 184 lb (83.5 kg)  11/02/17 170 lb 12.8 oz (77.5 kg)  07/12/17 185 lb (83.9 kg)    Physical Exam  Constitutional: No distress.  HENT:  Head: Normocephalic and atraumatic.  Mouth/Throat: No oropharyngeal exudate.  Mild postnasal drip in the oropharyngeal area, normal TMs  Eyes: Conjunctivae are normal. Pupils are equal, round, and reactive to light.  Cardiovascular: Normal rate, regular rhythm and normal heart sounds.  Pulmonary/Chest: Effort normal and breath sounds normal.  Musculoskeletal: She exhibits no edema.  Neurological: She is alert. Gait normal.  Skin: Skin is warm and dry. She is not diaphoretic.     Assessment/Plan: Please see individual problem list.  Viral URI Symptoms most consistent with viral upper respiratory infection.  She is staying well-hydrated.  Tessalon for cough.  Discussed Flonase or Claritin over-the-counter for symptom management. Will have CMA contact patient to advise every other day dosing for claritin if she decides to use this.  Given return precautions. If she does not improve she will contact us.    [name] was seen today for cough.  Diagnoses and all orders for this visit:  Viral URI  Other orders -     benzonatate (TESSALON) 200 MG capsule; Take 1 capsule (200 mg total) by mouth 2 (two) times daily as needed for cough.    No orders of the defined types were placed in this encounter.   Meds ordered this encounter  Medications  . benzonatate (TESSALON) 200 MG capsule    Sig: Take 1 capsule (200 mg total) by mouth 2 (two) times daily as needed for cough.    Dispense:  20 capsule    Refill:  0     Pamela Rumps, MD [place]

## 2018-01-16 ENCOUNTER — Ambulatory Visit (INDEPENDENT_AMBULATORY_CARE_PROVIDER_SITE_OTHER): Payer: Medicare HMO | Admitting: Internal Medicine

## 2018-01-16 ENCOUNTER — Ambulatory Visit (INDEPENDENT_AMBULATORY_CARE_PROVIDER_SITE_OTHER): Payer: Medicare HMO

## 2018-01-16 ENCOUNTER — Encounter: Payer: Self-pay | Admitting: Internal Medicine

## 2018-01-16 VITALS — BP 124/76 | HR 66 | Temp 97.6°F | Resp 15 | Ht 66.0 in | Wt 183.8 lb

## 2018-01-16 DIAGNOSIS — M79641 Pain in right hand: Secondary | ICD-10-CM | POA: Diagnosis not present

## 2018-01-16 DIAGNOSIS — M25541 Pain in joints of right hand: Secondary | ICD-10-CM | POA: Diagnosis not present

## 2018-01-16 DIAGNOSIS — E034 Atrophy of thyroid (acquired): Secondary | ICD-10-CM

## 2018-01-16 DIAGNOSIS — E559 Vitamin D deficiency, unspecified: Secondary | ICD-10-CM

## 2018-01-16 DIAGNOSIS — I10 Essential (primary) hypertension: Secondary | ICD-10-CM

## 2018-01-16 DIAGNOSIS — S42291S Other displaced fracture of upper end of right humerus, sequela: Secondary | ICD-10-CM

## 2018-01-16 DIAGNOSIS — M19041 Primary osteoarthritis, right hand: Secondary | ICD-10-CM | POA: Diagnosis not present

## 2018-01-16 LAB — COMPREHENSIVE METABOLIC PANEL
ALT: 12 U/L (ref 0–35)
AST: 21 U/L (ref 0–37)
Albumin: 4.1 g/dL (ref 3.5–5.2)
Alkaline Phosphatase: 50 U/L (ref 39–117)
BUN: 22 mg/dL (ref 6–23)
CO2: 25 mEq/L (ref 19–32)
Calcium: 9.5 mg/dL (ref 8.4–10.5)
Chloride: 105 mEq/L (ref 96–112)
Creatinine, Ser: 1.38 mg/dL — ABNORMAL HIGH (ref 0.40–1.20)
GFR: 38.19 mL/min — ABNORMAL LOW (ref >60.00)
Glucose, Bld: 100 mg/dL — ABNORMAL HIGH (ref 70–99)
Potassium: 4.4 mEq/L (ref 3.5–5.1)
Sodium: 139 mEq/L (ref 135–145)
Total Bilirubin: 0.7 mg/dL (ref 0.2–1.2)
Total Protein: 7.4 g/dL (ref 6.0–8.3)

## 2018-01-16 LAB — SEDIMENTATION RATE: Sed Rate: 25 mm/hr (ref 0–30)

## 2018-01-16 LAB — TSH: TSH: 2.65 u[IU]/mL (ref 0.35–4.50)

## 2018-01-16 LAB — VITAMIN D 25 HYDROXY (VIT D DEFICIENCY, FRACTURES): VITD: 38.74 ng/mL (ref 30.00–100.00)

## 2018-01-16 LAB — C-REACTIVE PROTEIN: CRP: 0.3 mg/dL — ABNORMAL LOW (ref 0.5–20.0)

## 2018-01-16 NOTE — Assessment & Plan Note (Signed)
Checking plain films and ESR to rule out inflammatory arthritis

## 2018-01-16 NOTE — Progress Notes (Signed)
 Subjective:  Patient ID: Pamela Paul, female    DOB: 11/16/1926  Age: 84 y.o. MRN: [id number]  CC: The primary encounter diagnosis was Right hand pain. Diagnoses of Essential hypertension, Hypothyroidism due to acquired atrophy of thyroid, Vitamin D deficiency, Other closed displaced fracture of proximal end of right humerus, sequela, and Pain involving joint of finger of right hand were also pertinent to this visit.  HPI Pamela Paul presents for follow up on hypertension, hyperthyroidism and mild CKD.  Hypertension: patient checks blood pressure twice weekly at home.  Readings have been for the most part > 140/80 at rest . Patient is following a reduce salt diet most days and is taking medications as prescribed  Swimming daily at [place], and doing water exercises to stregnthen hips.  Mild difficulty rising from seated position.   No falls,   Cc:  right index finger has signs of OA  Has occasional intense pain with involuntary medial deviation of digit.  handles it by taping it to the middle finger.  Not taking NSAIDs due to CKD.  Sees Kollaru (nephrologist) today.     Outpatient Medications Prior to Visit  Medication Sig Dispense Refill  . cholecalciferol (VITAMIN D) 1000 units tablet Take 1,000 Units by mouth daily.    . enalapril (VASOTEC) 10 MG tablet Take 10 mg by mouth daily.     . levothyroxine (SYNTHROID, LEVOTHROID) 50 MCG tablet TAKE 1 TABLET BY MOUTH ONCE DAILY 90 tablet 0  . Multiple Vitamins-Minerals (PRESERVISION AREDS PO) Take 1 capsule by mouth 2 (two) times daily.    . NON FORMULARY 1 capsule Two (2) times a day. Tumeric Herb    . benzonatate (TESSALON) 200 MG capsule Take 1 capsule (200 mg total) by mouth 2 (two) times daily as needed for cough. (Patient not taking: Reported on 01/16/2018) 20 capsule 0   No facility-administered medications prior to visit.     Review of Systems;  Patient denies headache, fevers, malaise, unintentional weight loss, skin rash,  eye pain, sinus congestion and sinus pain, sore throat, dysphagia,  hemoptysis , cough, dyspnea, wheezing, chest pain, palpitations, orthopnea, edema, abdominal pain, nausea, melena, diarrhea, constipation, flank pain, dysuria, hematuria, urinary  Frequency, nocturia, numbness, tingling, seizures,  Focal weakness, Loss of consciousness,  Tremor, insomnia, depression, anxiety, and suicidal ideation.      Objective:  BP 124/76 (BP Location: Left Arm, Patient Position: Sitting, Cuff Size: Normal)   Pulse 66   Temp 97.6 F (36.4 C) (Oral)   Resp 15   Ht 5' 6" (1.676 m)   Wt 183 lb 12.8 oz (83.4 kg)   SpO2 96%   BMI 29.67 kg/m   BP Readings from Last 3 Encounters:  01/16/18 124/76  12/11/17 130/66  11/02/17 120/70    Wt Readings from Last 3 Encounters:  01/16/18 183 lb 12.8 oz (83.4 kg)  12/11/17 184 lb (83.5 kg)  11/02/17 170 lb 12.8 oz (77.5 kg)    General appearance: alert, cooperative and appears stated age Ears: normal TM's and external ear canals both ears Throat: lips, mucosa, and tongue normal; teeth and gums normal Neck: no adenopathy, no carotid bruit, supple, symmetrical, trachea midline and thyroid not enlarged, symmetric, no tenderness/mass/nodules Back: symmetric, no curvature. ROM normal. No CVA tenderness. Lungs: clear to auscultation bilaterally Heart: regular rate and rhythm, S1, S2 normal, no murmur, click, rub or gallop Abdomen: soft, non-tender; bowel sounds normal; no masses,  no organomegaly Pulses: 2+ and symmetric Skin: Skin color, texture,    Subjective:  Patient ID: Pamela Paul, female    DOB: 11/16/1926  Age: 84 y.o. MRN: [id number]  CC: The primary encounter diagnosis was Right hand pain. Diagnoses of Essential hypertension, Hypothyroidism due to acquired atrophy of thyroid, Vitamin D deficiency, Other closed displaced fracture of proximal end of right humerus, sequela, and Pain involving joint of finger of right hand were also pertinent to this visit.  HPI Pamela Paul presents for follow up on hypertension, hyperthyroidism and mild CKD.  Hypertension: patient checks blood pressure twice weekly at home.  Readings have been for the most part > 140/80 at rest . Patient is following a reduce salt diet most days and is taking medications as prescribed  Swimming daily at [place], and doing water exercises to stregnthen hips.  Mild difficulty rising from seated position.   No falls,   Cc:  right index finger has signs of OA  Has occasional intense pain with involuntary medial deviation of digit.  handles it by taping it to the middle finger.  Not taking NSAIDs due to CKD.  Sees Kollaru (nephrologist) today.     Outpatient Medications Prior to Visit  Medication Sig Dispense Refill  . cholecalciferol (VITAMIN D) 1000 units tablet Take 1,000 Units by mouth daily.    . enalapril (VASOTEC) 10 MG tablet Take 10 mg by mouth daily.     . levothyroxine (SYNTHROID, LEVOTHROID) 50 MCG tablet TAKE 1 TABLET BY MOUTH ONCE DAILY 90 tablet 0  . Multiple Vitamins-Minerals (PRESERVISION AREDS PO) Take 1 capsule by mouth 2 (two) times daily.    . NON FORMULARY 1 capsule Two (2) times a day. Tumeric Herb    . benzonatate (TESSALON) 200 MG capsule Take 1 capsule (200 mg total) by mouth 2 (two) times daily as needed for cough. (Patient not taking: Reported on 01/16/2018) 20 capsule 0   No facility-administered medications prior to visit.     Review of Systems;  Patient denies headache, fevers, malaise, unintentional weight loss, skin rash,  eye pain, sinus congestion and sinus pain, sore throat, dysphagia,  hemoptysis , cough, dyspnea, wheezing, chest pain, palpitations, orthopnea, edema, abdominal pain, nausea, melena, diarrhea, constipation, flank pain, dysuria, hematuria, urinary  Frequency, nocturia, numbness, tingling, seizures,  Focal weakness, Loss of consciousness,  Tremor, insomnia, depression, anxiety, and suicidal ideation.      Objective:  BP 124/76 (BP Location: Left Arm, Patient Position: Sitting, Cuff Size: Normal)   Pulse 66   Temp 97.6 F (36.4 C) (Oral)   Resp 15   Ht 5' 6" (1.676 m)   Wt 183 lb 12.8 oz (83.4 kg)   SpO2 96%   BMI 29.67 kg/m   BP Readings from Last 3 Encounters:  01/16/18 124/76  12/11/17 130/66  11/02/17 120/70    Wt Readings from Last 3 Encounters:  01/16/18 183 lb 12.8 oz (83.4 kg)  12/11/17 184 lb (83.5 kg)  11/02/17 170 lb 12.8 oz (77.5 kg)    General appearance: alert, cooperative and appears stated age Ears: normal TM's and external ear canals both ears Throat: lips, mucosa, and tongue normal; teeth and gums normal Neck: no adenopathy, no carotid bruit, supple, symmetrical, trachea midline and thyroid not enlarged, symmetric, no tenderness/mass/nodules Back: symmetric, no curvature. ROM normal. No CVA tenderness. Lungs: clear to auscultation bilaterally Heart: regular rate and rhythm, S1, S2 normal, no murmur, click, rub or gallop Abdomen: soft, non-tender; bowel sounds normal; no masses,  no organomegaly Pulses: 2+ and symmetric Skin: Skin color, texture,

## 2018-01-16 NOTE — Patient Instructions (Signed)
You are doing very well!  The x rays today will look for sings of other forms of arthritis affecting your index finger and joints   Let Dr Rolly Salter know that you had labs today

## 2018-01-16 NOTE — Assessment & Plan Note (Signed)
Secondary to fall.  She has  had an assessment for osteoporosis with DEXA which was normal December 2017. Continue calcium and vitamin d

## 2018-01-16 NOTE — Assessment & Plan Note (Signed)
Thyroid function has been WNL on current dose. Checking level today  Lab Results  Component Value Date   TSH 2.73 07/12/2017

## 2018-01-16 NOTE — Assessment & Plan Note (Signed)
Well controlled on current regimen. Renal function  is due; no changes today.  

## 2018-01-16 NOTE — Assessment & Plan Note (Signed)
Recurrent lows in winter months noted . Taking n otc supplement of unclear dose. Checking today

## 2018-01-17 DIAGNOSIS — E875 Hyperkalemia: Secondary | ICD-10-CM | POA: Diagnosis not present

## 2018-01-17 DIAGNOSIS — N2581 Secondary hyperparathyroidism of renal origin: Secondary | ICD-10-CM | POA: Diagnosis not present

## 2018-01-17 DIAGNOSIS — N183 Chronic kidney disease, stage 3 (moderate): Secondary | ICD-10-CM | POA: Diagnosis not present

## 2018-01-17 DIAGNOSIS — I129 Hypertensive chronic kidney disease with stage 1 through stage 4 chronic kidney disease, or unspecified chronic kidney disease: Secondary | ICD-10-CM | POA: Diagnosis not present

## 2018-01-22 DIAGNOSIS — H43813 Vitreous degeneration, bilateral: Secondary | ICD-10-CM | POA: Diagnosis not present

## 2018-01-22 DIAGNOSIS — H353111 Nonexudative age-related macular degeneration, right eye, early dry stage: Secondary | ICD-10-CM | POA: Diagnosis not present

## 2018-02-11 ENCOUNTER — Other Ambulatory Visit: Payer: Self-pay | Admitting: Internal Medicine

## 2018-05-07 ENCOUNTER — Telehealth: Payer: Self-pay

## 2018-05-07 NOTE — Telephone Encounter (Signed)
Fine with me

## 2018-05-07 NOTE — Telephone Encounter (Signed)
Ok if ok with D.r Derrel Nip  [facility]

## 2018-05-07 NOTE — Telephone Encounter (Signed)
Copied from [place] [id number]. Topic: Appointment Scheduling - Scheduling Inquiry for Clinic >> May 07, 2018 10:58 AM Bea Graff, NT wrote: Reason for CRM: Pt would like to change her primary care physician from Dr. Derrel Nip to Dr. Terese Door because she would like to try and see how she is as a physician. Please call to schedule if this is approved.

## 2018-05-08 NOTE — Telephone Encounter (Signed)
Patient has been scheduled to see Dr. Aundra Dubin on 07-31-18 per the ok with Dr. Aundra Dubin and Dr. Derrel Nip.

## 2018-05-30 DIAGNOSIS — L298 Other pruritus: Secondary | ICD-10-CM | POA: Diagnosis not present

## 2018-05-30 DIAGNOSIS — L538 Other specified erythematous conditions: Secondary | ICD-10-CM | POA: Diagnosis not present

## 2018-05-30 DIAGNOSIS — L820 Inflamed seborrheic keratosis: Secondary | ICD-10-CM | POA: Diagnosis not present

## 2018-07-17 ENCOUNTER — Ambulatory Visit: Payer: Medicare HMO | Admitting: Internal Medicine

## 2018-07-30 DIAGNOSIS — Z961 Presence of intraocular lens: Secondary | ICD-10-CM | POA: Diagnosis not present

## 2018-07-30 DIAGNOSIS — H43813 Vitreous degeneration, bilateral: Secondary | ICD-10-CM | POA: Diagnosis not present

## 2018-07-30 DIAGNOSIS — H26493 Other secondary cataract, bilateral: Secondary | ICD-10-CM | POA: Diagnosis not present

## 2018-07-30 DIAGNOSIS — H353131 Nonexudative age-related macular degeneration, bilateral, early dry stage: Secondary | ICD-10-CM | POA: Diagnosis not present

## 2018-07-31 ENCOUNTER — Encounter: Payer: Self-pay | Admitting: Internal Medicine

## 2018-07-31 ENCOUNTER — Ambulatory Visit: Payer: Medicare HMO | Admitting: Internal Medicine

## 2018-07-31 VITALS — BP 128/70 | HR 65 | Temp 97.5°F | Resp 16 | Ht 66.0 in | Wt 183.1 lb

## 2018-07-31 DIAGNOSIS — H3530 Unspecified macular degeneration: Secondary | ICD-10-CM | POA: Diagnosis not present

## 2018-07-31 DIAGNOSIS — E785 Hyperlipidemia, unspecified: Secondary | ICD-10-CM

## 2018-07-31 DIAGNOSIS — I10 Essential (primary) hypertension: Secondary | ICD-10-CM | POA: Diagnosis not present

## 2018-07-31 DIAGNOSIS — N183 Chronic kidney disease, stage 3 unspecified: Secondary | ICD-10-CM

## 2018-07-31 DIAGNOSIS — E039 Hypothyroidism, unspecified: Secondary | ICD-10-CM

## 2018-07-31 LAB — CBC WITH DIFFERENTIAL/PLATELET
Basophils Absolute: 0.1 10*3/uL (ref 0.0–0.1)
Basophils Relative: 1.1 % (ref 0.0–3.0)
Eosinophils Absolute: 0.3 10*3/uL (ref 0.0–0.7)
Eosinophils Relative: 3.9 % (ref 0.0–5.0)
HCT: 35.7 % — ABNORMAL LOW (ref 36.0–46.0)
Hemoglobin: 12.4 g/dL (ref 12.0–15.0)
Lymphocytes Relative: 32.1 % (ref 12.0–46.0)
Lymphs Abs: 2.1 10*3/uL (ref 0.7–4.0)
MCHC: 34.7 g/dL (ref 30.0–36.0)
MCV: 94.7 fl (ref 78.0–100.0)
Monocytes Absolute: 0.6 10*3/uL (ref 0.1–1.0)
Monocytes Relative: 8.6 % (ref 3.0–12.0)
Neutro Abs: 3.6 10*3/uL (ref 1.4–7.7)
Neutrophils Relative %: 54.3 % (ref 43.0–77.0)
Platelets: 296 10*3/uL (ref 150.0–400.0)
RBC: 3.77 Mil/uL — ABNORMAL LOW (ref 3.87–5.11)
RDW: 12.4 % (ref 11.5–15.5)
WBC: 6.6 10*3/uL (ref 4.0–10.5)

## 2018-07-31 LAB — COMPREHENSIVE METABOLIC PANEL
ALT: 12 U/L (ref 0–35)
AST: 19 U/L (ref 0–37)
Albumin: 4.2 g/dL (ref 3.5–5.2)
Alkaline Phosphatase: 48 U/L (ref 39–117)
BUN: 31 mg/dL — ABNORMAL HIGH (ref 6–23)
CO2: 24 mEq/L (ref 19–32)
Calcium: 9.5 mg/dL (ref 8.4–10.5)
Chloride: 104 mEq/L (ref 96–112)
Creatinine, Ser: 1.6 mg/dL — ABNORMAL HIGH (ref 0.40–1.20)
GFR: 32.16 mL/min — ABNORMAL LOW (ref >60.00)
Glucose, Bld: 107 mg/dL — ABNORMAL HIGH (ref 70–99)
Potassium: 4.7 mEq/L (ref 3.5–5.1)
Sodium: 137 mEq/L (ref 135–145)
Total Bilirubin: 0.8 mg/dL (ref 0.2–1.2)
Total Protein: 7.4 g/dL (ref 6.0–8.3)

## 2018-07-31 LAB — LIPID PANEL
Cholesterol: 206 mg/dL — ABNORMAL HIGH (ref 0–200)
HDL: 47.9 mg/dL (ref >39.00)
LDL Cholesterol: 124 mg/dL — ABNORMAL HIGH (ref 0–99)
NonHDL: 158.4
Total CHOL/HDL Ratio: 4
Triglycerides: 172 mg/dL — ABNORMAL HIGH (ref 0.0–149.0)
VLDL: 34.4 mg/dL (ref 0.0–40.0)

## 2018-07-31 LAB — TSH: TSH: 2.47 u[IU]/mL (ref 0.35–4.50)

## 2018-07-31 NOTE — Progress Notes (Signed)
Chief Complaint  Patient presents with  . Follow-up   6 month f/u pt is not switching care just getting to know all providers in practice  1. HTN BP controlled on vasotec 10 mg qd  2. Hypothyroidism on levo 50 mcg qam  3. CKD 3 stable f/u with Dr. Juleen China 08/06/18 has left kidney cysts 4.3 x 3.3 cm noted since 2016 and increased in size since 2010  4. Macular degeneration dry R>L with reduced vision in right eye but not debilitation still driving f/u [facility] opth  Review of Systems  Constitutional: Negative for weight loss.  HENT: Negative for hearing loss.   Eyes: Negative for blurred vision.  Respiratory: Negative for shortness of breath.   Cardiovascular: Negative for chest pain.  Genitourinary: Negative for hematuria.  Musculoskeletal: Negative for falls.  Skin: Negative for rash.  Neurological: Negative for headaches.  Psychiatric/Behavioral: Negative for depression.   Past Medical History:  Diagnosis Date  . Heart disease    mitral valvular  . Hyperlipidemia   . Hypertension   . Hypothyroidism   . Spinal stenosis of lumbar region 2006   s/p surgery   Past Surgical History:  Procedure Laterality Date  . ABDOMINAL HYSTERECTOMY    . bilateral cataract surgery    . CHOLECYSTECTOMY  1980  . JOINT REPLACEMENT     Bilateral knee  . LAPAROSCOPIC SIGMOID COLECTOMY  2011   secondary to benign mass  . REPLACEMENT TOTAL KNEE BILATERAL    . SMALL INTESTINE SURGERY  2010   for SBO  . SPINE SURGERY  2006   [facility], [facility]  . tendon repair     achille heall, left    Family History  Problem Relation Age of Onset  . Arthritis Mother   . Stroke Mother   . Hyperlipidemia Father   . Cancer Father   . Heart disease Paternal Aunt    Social History   Socioeconomic History  . Marital status: Married    Spouse name: Not on file  . Number of children: Not on file  . Years of education: Not on file  . Highest education level: Not on file  Occupational History  . Not on file   Social Needs  . Financial resource strain: Not hard at all  . Food insecurity:    Worry: Never true    Inability: Never true  . Transportation needs:    Medical: Not on file    Non-medical: Not on file  Tobacco Use  . Smoking status: Former Smoker    Last attempt to quit: 03/15/1961    Years since quitting: 57.4  . Smokeless tobacco: Never Used  . Tobacco comment: Quit many years ago.  Substance and Sexual Activity  . Alcohol use: Yes    Comment: occasionally  . Drug use: No  . Sexual activity: Never  Lifestyle  . Physical activity:    Days per week: Not on file    Minutes per session: Not on file  . Stress: Not on file  Relationships  . Social connections:    Talks on phone: Not on file    Gets together: Not on file    Attends religious service: Not on file    Active member of club or organization: Not on file    Attends meetings of clubs or organizations: Not on file    Relationship status: Not on file  . Intimate partner violence:    Fear of current or ex partner: Not on file  Emotionally abused: Not on file    Physically abused: Not on file    Forced sexual activity: Not on file  Other Topics Concern  . Not on file  Social History Narrative  . Not on file   Current Meds  Medication Sig  . cholecalciferol (VITAMIN D) 1000 units tablet Take 1,000 Units by mouth daily.  . enalapril (VASOTEC) 10 MG tablet Take 10 mg by mouth daily.   Marland Kitchen levothyroxine (SYNTHROID, LEVOTHROID) 50 MCG tablet TAKE 1 TABLET BY MOUTH ONCE DAILY  . Multiple Vitamins-Minerals (PRESERVISION AREDS PO) Take 1 capsule by mouth 2 (two) times daily.  . NON FORMULARY 1 capsule Two (2) times a day. Tumeric Herb   No Known Allergies No results found for this or any previous visit (from the past 2160 hour(s)). Objective  Body mass index is 29.56 kg/m. Wt Readings from Last 3 Encounters:  07/31/18 183 lb 2 oz (83.1 kg)  01/16/18 183 lb 12.8 oz (83.4 kg)  12/11/17 184 lb (83.5 kg)   Temp  Readings from Last 3 Encounters:  07/31/18 (!) 97.5 F (36.4 C) (Oral)  01/16/18 97.6 F (36.4 C) (Oral)  12/11/17 98 F (36.7 C) (Oral)   BP Readings from Last 3 Encounters:  07/31/18 128/70  01/16/18 124/76  12/11/17 130/66   Pulse Readings from Last 3 Encounters:  07/31/18 65  01/16/18 66  12/11/17 91    Physical Exam  Constitutional: She is oriented to person, place, and time. Vital signs are normal. She appears well-developed and well-nourished. She is cooperative.  HENT:  Head: Normocephalic and atraumatic.  Mouth/Throat: Oropharynx is clear and moist and mucous membranes are normal.  Eyes: Pupils are equal, round, and reactive to light. Conjunctivae are normal.  Cardiovascular: Normal rate, regular rhythm and normal heart sounds.  Pulmonary/Chest: Effort normal and breath sounds normal.  Neurological: She is alert and oriented to person, place, and time. Gait normal.  Skin: Skin is warm, dry and intact.  Psychiatric: She has a normal mood and affect. Her speech is normal and behavior is normal. Judgment and thought content normal. Cognition and memory are normal.  Nursing note and vitals reviewed.   Assessment   1. HTN/HLD 2. Hypothyroidism  3. CKD 3  4. Macular degeneration  5. HM Plan   1. Cont meds  Labs today  CMET, CBC, TSH, lipid 2. Cont meds  See above  3. F/u renal Dr. Abigail Butts 08/06/18 will cc labs to him  Consider repeat renal US in future will disc with renal also rec UA and urine protein labs  4. [facility] 5.  rec flu shot >65 y.o  All of vaccines up to day  Out of age window pap, mammo, colonoscopy  DEXA utd  Derm appt upcoming Dr. Kellie Moor     Provider: Dr. Olivia Mackie McLean-Scocuzza-Internal Medicine

## 2018-07-31 NOTE — Patient Instructions (Addendum)
Ask Dr. Juleen China to check basic urine and urine for protein and also consider repeating kidney ultrasound to follow up on the size of left kidney cyst in 2016 it was 4.3 x 3.3 cm   Stay hydrated with water, avoid Aleve, Ibuprofen, Motrin  Tylenol is okay if 500 mg no more than 6 pills in 1 day  Chronic Kidney Disease, Adult Chronic kidney disease (CKD) happens when the kidneys are damaged during a time of 3 or more months. The kidneys are two organs that do many important jobs in the body. These jobs include:  Removing wastes and extra fluids from the blood.  Making hormones that maintain the amount of fluid in your tissues and blood vessels.  Making sure that the body has the right amount of fluids and chemicals.  Most of the time, this condition does not go away, but it can usually be controlled. Steps must be taken to slow down the kidney damage or stop it from getting worse. Otherwise, the kidneys may stop working. Follow these instructions at home:  Follow your diet as told by your doctor. You may need to avoid alcohol, salty foods (sodium), and foods that are high in potassium, calcium, and protein.  Take over-the-counter and prescription medicines only as told by your doctor. Do not take any new medicines unless your doctor says you can do that. These include vitamins and minerals. ? Medicines and nutritional supplements can make kidney damage worse. ? Your doctor may need to change how much medicine you take.  Do not use any tobacco products. These include cigarettes, chewing tobacco, and e-cigarettes. If you need help quitting, ask your doctor.  Keep all follow-up visits as told by your doctor. This is important.  Check your blood pressure. Tell your doctor if there are changes to your blood pressure.  Get to a healthy weight. Stay at that weight. If you need help with this, ask your doctor.  Start or continue an exercise plan. Try to exercise at least 30 minutes a day, 5 days  a week.  Stay up-to-date with your shots (immunizations) as told by your doctor. Contact a doctor if:  Your symptoms get worse.  You have new symptoms. Get help right away if:  You have symptoms of end-stage kidney disease. These include: ? Headaches. ? Skin that is darker or lighter than normal. ? Numbness in your hands or feet. ? Easy bruising. ? Having hiccups often. ? Chest pain. ? Shortness of breath. ? Stopping of menstrual periods in women.  You have a fever.  You are making very little pee (urine).  You have pain or bleeding when you pee (urinate). This information is not intended to replace advice given to you by your health care provider. Make sure you discuss any questions you have with your health care provider. Document Released: 02/14/2010 Document Revised: 04/27/2016 Document Reviewed: 07/19/2012 Elsevier Interactive Patient Education  2017 [employer].

## 2018-08-07 DIAGNOSIS — N2581 Secondary hyperparathyroidism of renal origin: Secondary | ICD-10-CM | POA: Diagnosis not present

## 2018-08-07 DIAGNOSIS — N183 Chronic kidney disease, stage 3 (moderate): Secondary | ICD-10-CM | POA: Diagnosis not present

## 2018-08-07 DIAGNOSIS — E875 Hyperkalemia: Secondary | ICD-10-CM | POA: Diagnosis not present

## 2018-08-07 DIAGNOSIS — N2889 Other specified disorders of kidney and ureter: Secondary | ICD-10-CM | POA: Diagnosis not present

## 2018-08-07 DIAGNOSIS — I129 Hypertensive chronic kidney disease with stage 1 through stage 4 chronic kidney disease, or unspecified chronic kidney disease: Secondary | ICD-10-CM | POA: Diagnosis not present

## 2018-08-12 ENCOUNTER — Other Ambulatory Visit: Payer: Self-pay | Admitting: Internal Medicine

## 2018-08-12 NOTE — Telephone Encounter (Signed)
Copied from [place] [id number]. Topic: Quick Communication - Rx Refill/Question >> Aug 12, 2018  8:39 AM Reyne Dumas L wrote: Medication: enalapril (VASOTEC) 10 MG tablet  Has the patient contacted their pharmacy? Yes - out of refills (Agent: If no, request that the patient contact the pharmacy for the refill.) (Agent: If yes, when and what did the pharmacy advise?)  Preferred Pharmacy (with phone number or street name): [facility] [address] [place] - [place] [phone] (Phone) [phone] (Fax)  Agent: Please be advised that RX refills may take up to 3 business days. We ask that you follow-up with your pharmacy.

## 2018-08-12 NOTE — Telephone Encounter (Signed)
Rx request This medication was last prescribed on 07-31-16 by another provider, patient last saw Dr. Aundra Dubin on 07-31-18.

## 2018-08-13 MED ORDER — ENALAPRIL MALEATE 10 MG PO TABS: 10.0000 mg | ORAL_TABLET | Freq: Every day | ORAL | 1 refills | Status: DC

## 2018-08-13 NOTE — Telephone Encounter (Signed)
Refilled: 02/02/2016 Last OV: 01/16/2018 Next OV: 01/31/2018  Saw Dr. Aundra Dubin on 07/31/2018

## 2018-08-13 NOTE — Telephone Encounter (Signed)
Refilled for 6 months

## 2018-08-19 ENCOUNTER — Other Ambulatory Visit: Payer: Self-pay | Admitting: Internal Medicine

## 2018-09-04 DIAGNOSIS — Z85828 Personal history of other malignant neoplasm of skin: Secondary | ICD-10-CM | POA: Diagnosis not present

## 2018-09-04 DIAGNOSIS — L298 Other pruritus: Secondary | ICD-10-CM | POA: Diagnosis not present

## 2018-09-04 DIAGNOSIS — L308 Other specified dermatitis: Secondary | ICD-10-CM | POA: Diagnosis not present

## 2018-09-04 DIAGNOSIS — Z08 Encounter for follow-up examination after completed treatment for malignant neoplasm: Secondary | ICD-10-CM | POA: Diagnosis not present

## 2018-09-04 DIAGNOSIS — L821 Other seborrheic keratosis: Secondary | ICD-10-CM | POA: Diagnosis not present

## 2018-09-04 DIAGNOSIS — D485 Neoplasm of uncertain behavior of skin: Secondary | ICD-10-CM | POA: Diagnosis not present

## 2018-11-05 ENCOUNTER — Ambulatory Visit: Payer: Medicare HMO

## 2018-11-12 ENCOUNTER — Telehealth: Payer: Self-pay

## 2018-11-12 DIAGNOSIS — R2689 Other abnormalities of gait and mobility: Secondary | ICD-10-CM

## 2018-11-12 DIAGNOSIS — R296 Repeated falls: Secondary | ICD-10-CM

## 2018-11-12 NOTE — Telephone Encounter (Signed)
Copied from [place] [id number]. Topic: General - Other >> Nov 12, 2018 10:25 AM Janace Aris A wrote: Reason for CRM: Tiffany from [facility] called in, says the patient lost her balance twice, and the patient is requesting an order for physical therapy.   [phone]  [phone] (f)

## 2018-11-12 NOTE — Telephone Encounter (Signed)
Pt is requesting a referral for PT at [facility] for balance.

## 2018-11-13 NOTE — Telephone Encounter (Signed)
Referral to [facility] made for balance Pamela Paul

## 2018-11-13 NOTE — Addendum Note (Signed)
Addended by: [name] on: 11/13/2018 01:15 PM   Modules accepted: Orders

## 2018-11-18 DIAGNOSIS — R278 Other lack of coordination: Secondary | ICD-10-CM | POA: Diagnosis not present

## 2018-11-18 DIAGNOSIS — M6281 Muscle weakness (generalized): Secondary | ICD-10-CM | POA: Diagnosis not present

## 2018-11-18 DIAGNOSIS — R2681 Unsteadiness on feet: Secondary | ICD-10-CM | POA: Diagnosis not present

## 2018-11-18 DIAGNOSIS — Z741 Need for assistance with personal care: Secondary | ICD-10-CM | POA: Diagnosis not present

## 2018-11-18 NOTE — Telephone Encounter (Signed)
Patient called in and stated she needed her PT at [facility] due to no transportation she states she received a phone call from PT on [place]

## 2018-11-21 DIAGNOSIS — R2689 Other abnormalities of gait and mobility: Secondary | ICD-10-CM | POA: Diagnosis not present

## 2018-11-21 DIAGNOSIS — R2681 Unsteadiness on feet: Secondary | ICD-10-CM | POA: Diagnosis not present

## 2018-11-22 DIAGNOSIS — R296 Repeated falls: Secondary | ICD-10-CM

## 2018-11-22 DIAGNOSIS — R2681 Unsteadiness on feet: Secondary | ICD-10-CM

## 2018-11-22 DIAGNOSIS — R2689 Other abnormalities of gait and mobility: Secondary | ICD-10-CM

## 2018-11-30 DIAGNOSIS — R2681 Unsteadiness on feet: Secondary | ICD-10-CM | POA: Diagnosis not present

## 2018-11-30 DIAGNOSIS — R2689 Other abnormalities of gait and mobility: Secondary | ICD-10-CM | POA: Diagnosis not present

## 2018-12-03 DIAGNOSIS — R2681 Unsteadiness on feet: Secondary | ICD-10-CM | POA: Diagnosis not present

## 2018-12-03 DIAGNOSIS — R2689 Other abnormalities of gait and mobility: Secondary | ICD-10-CM | POA: Diagnosis not present

## 2018-12-05 DIAGNOSIS — R296 Repeated falls: Secondary | ICD-10-CM | POA: Diagnosis not present

## 2018-12-05 DIAGNOSIS — R2681 Unsteadiness on feet: Secondary | ICD-10-CM | POA: Diagnosis not present

## 2018-12-09 DIAGNOSIS — H903 Sensorineural hearing loss, bilateral: Secondary | ICD-10-CM | POA: Diagnosis not present

## 2018-12-10 ENCOUNTER — Other Ambulatory Visit: Payer: Self-pay

## 2018-12-10 DIAGNOSIS — R296 Repeated falls: Secondary | ICD-10-CM | POA: Diagnosis not present

## 2018-12-10 DIAGNOSIS — R2681 Unsteadiness on feet: Secondary | ICD-10-CM | POA: Diagnosis not present

## 2018-12-10 MED ORDER — ENALAPRIL MALEATE 10 MG PO TABS: 10.0000 mg | ORAL_TABLET | Freq: Every day | ORAL | 1 refills | Status: DC

## 2018-12-10 MED ORDER — LEVOTHYROXINE SODIUM 50 MCG PO TABS: 50.0000 ug | ORAL_TABLET | Freq: Every day | ORAL | 0 refills | Status: DC

## 2018-12-11 ENCOUNTER — Other Ambulatory Visit: Payer: Self-pay | Admitting: *Deleted

## 2018-12-11 ENCOUNTER — Ambulatory Visit: Payer: Self-pay | Admitting: *Deleted

## 2018-12-11 MED ORDER — LEVOTHYROXINE SODIUM 50 MCG PO TABS: 50.0000 ug | ORAL_TABLET | Freq: Every day | ORAL | 0 refills | Status: DC

## 2018-12-11 MED ORDER — ENALAPRIL MALEATE 10 MG PO TABS: 10.0000 mg | ORAL_TABLET | Freq: Every day | ORAL | 1 refills | Status: DC

## 2018-12-11 NOTE — Progress Notes (Signed)
Pt stated the prescriptions for enalapril (VASOTEC) 10 MG tablet and levothyroxine (SYNTHROID, LEVOTHROID) 50 MCG tablet should have been sent to [place], [place] [phone] (Phone) [phone] (Fax)       Medications resent to [place]. Will notify [facility].

## 2018-12-11 NOTE — Telephone Encounter (Signed)
Pt stated the prescriptions for enalapril (VASOTEC) 10 MG tablet and levothyroxine (SYNTHROID, LEVOTHROID) 50 MCG tablet should have been sent to [place], [place] [phone] (Phone) [phone] (Fax)

## 2018-12-11 NOTE — Telephone Encounter (Signed)
Called patient to let her know that her medications (synthroid and vasotec) had been sent to [facility] mail. Pt voiced understanding.

## 2018-12-12 DIAGNOSIS — R296 Repeated falls: Secondary | ICD-10-CM | POA: Diagnosis not present

## 2018-12-12 DIAGNOSIS — R2681 Unsteadiness on feet: Secondary | ICD-10-CM | POA: Diagnosis not present

## 2018-12-17 DIAGNOSIS — R2681 Unsteadiness on feet: Secondary | ICD-10-CM | POA: Diagnosis not present

## 2018-12-17 DIAGNOSIS — R296 Repeated falls: Secondary | ICD-10-CM | POA: Diagnosis not present

## 2018-12-19 DIAGNOSIS — R2681 Unsteadiness on feet: Secondary | ICD-10-CM | POA: Diagnosis not present

## 2018-12-19 DIAGNOSIS — R296 Repeated falls: Secondary | ICD-10-CM | POA: Diagnosis not present

## 2018-12-24 DIAGNOSIS — R296 Repeated falls: Secondary | ICD-10-CM | POA: Diagnosis not present

## 2018-12-24 DIAGNOSIS — R2681 Unsteadiness on feet: Secondary | ICD-10-CM | POA: Diagnosis not present

## 2018-12-29 ENCOUNTER — Inpatient Hospital Stay (HOSPITAL_BASED_OUTPATIENT_CLINIC_OR_DEPARTMENT_OTHER)
Admission: EM | Admit: 2018-12-29 | Discharge: 2019-01-03 | DRG: 481 | Disposition: A | Discharge status: Skilled Nursing Facility | Payer: Medicare PPO | Source: Other Acute Inpatient Hospital | Attending: Orthopaedic Surgery | Admitting: Orthopaedic Surgery

## 2018-12-29 ENCOUNTER — Other Ambulatory Visit: Payer: Self-pay | Admitting: Family Medicine

## 2018-12-29 ENCOUNTER — Other Ambulatory Visit: Payer: Self-pay

## 2018-12-29 ENCOUNTER — Inpatient Hospital Stay (HOSPITAL_COMMUNITY): Payer: Medicare PPO | Admitting: Orthopaedic Surgery

## 2018-12-29 DIAGNOSIS — M6281 Muscle weakness (generalized): Secondary | ICD-10-CM | POA: Diagnosis not present

## 2018-12-29 DIAGNOSIS — S3993XA Unspecified injury of pelvis, initial encounter: Secondary | ICD-10-CM | POA: Diagnosis not present

## 2018-12-29 DIAGNOSIS — Z981 Arthrodesis status: Secondary | ICD-10-CM | POA: Diagnosis not present

## 2018-12-29 DIAGNOSIS — S99922A Unspecified injury of left foot, initial encounter: Secondary | ICD-10-CM | POA: Diagnosis not present

## 2018-12-29 DIAGNOSIS — R296 Repeated falls: Secondary | ICD-10-CM | POA: Diagnosis not present

## 2018-12-29 DIAGNOSIS — Z79899 Other long term (current) drug therapy: Secondary | ICD-10-CM | POA: Diagnosis not present

## 2018-12-29 DIAGNOSIS — R269 Unspecified abnormalities of gait and mobility: Secondary | ICD-10-CM | POA: Diagnosis not present

## 2018-12-29 DIAGNOSIS — Z66 Do not resuscitate: Secondary | ICD-10-CM | POA: Diagnosis not present

## 2018-12-29 DIAGNOSIS — Z736 Limitation of activities due to disability: Secondary | ICD-10-CM | POA: Diagnosis not present

## 2018-12-29 DIAGNOSIS — M9712XD Periprosthetic fracture around internal prosthetic left knee joint, subsequent encounter: Secondary | ICD-10-CM | POA: Diagnosis not present

## 2018-12-29 DIAGNOSIS — I10 Essential (primary) hypertension: Secondary | ICD-10-CM | POA: Diagnosis not present

## 2018-12-29 DIAGNOSIS — S72492A Other fracture of lower end of left femur, initial encounter for closed fracture: Secondary | ICD-10-CM | POA: Diagnosis not present

## 2018-12-29 DIAGNOSIS — M80852A Other osteoporosis with current pathological fracture, left femur, initial encounter for fracture: Secondary | ICD-10-CM | POA: Diagnosis not present

## 2018-12-29 DIAGNOSIS — M9712XA Periprosthetic fracture around internal prosthetic left knee joint, initial encounter: Secondary | ICD-10-CM | POA: Diagnosis not present

## 2018-12-29 DIAGNOSIS — N179 Acute kidney failure, unspecified: Secondary | ICD-10-CM | POA: Diagnosis not present

## 2018-12-29 DIAGNOSIS — Z01810 Encounter for preprocedural cardiovascular examination: Secondary | ICD-10-CM | POA: Diagnosis not present

## 2018-12-29 DIAGNOSIS — K521 Toxic gastroenteritis and colitis: Secondary | ICD-10-CM | POA: Diagnosis not present

## 2018-12-29 DIAGNOSIS — S72452A Displaced supracondylar fracture without intracondylar extension of lower end of left femur, initial encounter for closed fracture: Secondary | ICD-10-CM | POA: Diagnosis not present

## 2018-12-29 DIAGNOSIS — E039 Hypothyroidism, unspecified: Secondary | ICD-10-CM | POA: Diagnosis not present

## 2018-12-29 DIAGNOSIS — M79662 Pain in left lower leg: Secondary | ICD-10-CM | POA: Diagnosis not present

## 2018-12-29 DIAGNOSIS — W1830XA Fall on same level, unspecified, initial encounter: Secondary | ICD-10-CM | POA: Diagnosis not present

## 2018-12-29 DIAGNOSIS — R262 Difficulty in walking, not elsewhere classified: Secondary | ICD-10-CM | POA: Diagnosis not present

## 2018-12-29 DIAGNOSIS — D62 Acute posthemorrhagic anemia: Secondary | ICD-10-CM | POA: Diagnosis not present

## 2018-12-29 DIAGNOSIS — S72402D Unspecified fracture of lower end of left femur, subsequent encounter for closed fracture with routine healing: Secondary | ICD-10-CM | POA: Diagnosis not present

## 2018-12-29 DIAGNOSIS — W19XXXA Unspecified fall, initial encounter: Secondary | ICD-10-CM | POA: Diagnosis not present

## 2018-12-29 DIAGNOSIS — Z9989 Dependence on other enabling machines and devices: Secondary | ICD-10-CM | POA: Diagnosis not present

## 2018-12-29 DIAGNOSIS — Z96653 Presence of artificial knee joint, bilateral: Secondary | ICD-10-CM | POA: Diagnosis not present

## 2018-12-29 DIAGNOSIS — W010XXA Fall on same level from slipping, tripping and stumbling without subsequent striking against object, initial encounter: Secondary | ICD-10-CM

## 2018-12-29 DIAGNOSIS — H902 Conductive hearing loss, unspecified: Secondary | ICD-10-CM | POA: Diagnosis present

## 2018-12-29 DIAGNOSIS — Y9234 Swimming pool (public) as the place of occurrence of the external cause: Secondary | ICD-10-CM

## 2018-12-29 DIAGNOSIS — T50905A Adverse effect of unspecified drugs, medicaments and biological substances, initial encounter: Secondary | ICD-10-CM | POA: Diagnosis not present

## 2018-12-29 DIAGNOSIS — H3530 Unspecified macular degeneration: Secondary | ICD-10-CM | POA: Diagnosis present

## 2018-12-29 HISTORY — PX: FRACTURE SURGERY: SHX138

## 2018-12-29 LAB — COMPREHENSIVE METABOLIC PANEL
ALT (GPT): 13 U/L (ref 7–33)
AST (GOT): 20 U/L (ref 9–38)
Albumin: 4.4 g/dL (ref 3.5–5.2)
Alkaline Phosphatase (Total): 50 U/L (ref 49–199)
Anion Gap: 9 (ref 4–12)
Bilirubin (Total): 0.8 mg/dL (ref 0.2–1.3)
Calcium: 9.5 mg/dL (ref 8.9–10.2)
Carbon Dioxide, Total: 24 meq/L (ref 22–32)
Chloride: 106 meq/L (ref 98–108)
Creatinine: 1.51 mg/dL — ABNORMAL HIGH (ref 0.38–1.02)
GFR, Calc, African American: 39 mL/min/{1.73_m2} — ABNORMAL LOW (ref >59)
GFR, Calc, European American: 32 mL/min/{1.73_m2} — ABNORMAL LOW (ref >59)
Glucose: 90 mg/dL (ref 62–125)
Potassium: 4 meq/L (ref 3.6–5.2)
Protein (Total): 7.1 g/dL (ref 6.0–8.2)
Sodium: 139 meq/L (ref 135–145)
Urea Nitrogen: 27 mg/dL — ABNORMAL HIGH (ref 8–21)

## 2018-12-29 LAB — CBC, DIFF
% Basophils: 1 %
% Eosinophils: 1 %
% Immature Granulocytes: 0 %
% Lymphocytes: 22 %
% Monocytes: 6 %
% Neutrophils: 70 %
% Nucleated RBC: 0 %
Absolute Eosinophil Count: 0.06 10*3/uL (ref 0.00–0.50)
Absolute Lymphocyte Count: 2.14 10*3/uL (ref 1.00–4.80)
Basophils: 0.08 10*3/uL (ref 0.00–0.20)
Hematocrit: 35 % — ABNORMAL LOW (ref 36–45)
Hemoglobin: 12.2 g/dL (ref 11.5–15.5)
Immature Granulocytes: 0.04 10*3/uL (ref 0.00–0.05)
MCH: 32.8 pg (ref 27.3–33.6)
MCHC: 34.9 g/dL (ref 32.2–36.5)
MCV: 94 fL (ref 81–98)
Monocytes: 0.58 10*3/uL (ref 0.00–0.80)
Neutrophils: 6.89 10*3/uL (ref 1.80–7.00)
Nucleated RBC: 0 10*3/uL
Platelet Count: 266 10*3/uL (ref 150–400)
RBC: 3.72 10*6/uL — ABNORMAL LOW (ref 3.80–5.00)
RDW-CV: 11.6 % (ref 11.6–14.4)
WBC: 9.79 10*3/uL (ref 4.30–10.00)

## 2018-12-29 LAB — PROTHROMBIN & PTT
Partial Thromboplastin Time: 33 s (ref 22–35)
Prothrombin INR: 1 (ref 0.8–1.3)
Prothrombin Time Patient: 13.2 s (ref 10.7–15.6)

## 2018-12-29 LAB — BLOOD TYPE CONFIRMATION: ABO/Rh: O POS

## 2018-12-29 LAB — TYPE AND SCREEN
ABO/Rh: O POS
Antibody Screen: NEGATIVE

## 2018-12-30 DIAGNOSIS — E039 Hypothyroidism, unspecified: Secondary | ICD-10-CM

## 2018-12-30 DIAGNOSIS — N179 Acute kidney failure, unspecified: Secondary | ICD-10-CM

## 2018-12-30 DIAGNOSIS — D62 Acute posthemorrhagic anemia: Secondary | ICD-10-CM

## 2018-12-30 DIAGNOSIS — I10 Essential (primary) hypertension: Secondary | ICD-10-CM

## 2018-12-30 LAB — IRON BINDING CAPACITY (W/IRON, TRANSFERRIN & TRANSF SAT)
Iron, SRM: 37 ug/dL (ref 31–171)
Total Iron Binding Capacity: 262 ug/dL — ABNORMAL LOW (ref 270–535)
Transferrin Saturation: 14 % (ref 10–45)
Transferrin: 187 mg/dL — ABNORMAL LOW (ref 192–382)

## 2018-12-30 LAB — CBC (HEMOGRAM)
Hematocrit: 28 % — ABNORMAL LOW (ref 36–45)
Hematocrit: 28 % — ABNORMAL LOW (ref 36–45)
Hemoglobin: 9.5 g/dL — ABNORMAL LOW (ref 11.5–15.5)
Hemoglobin: 9.5 g/dL — ABNORMAL LOW (ref 11.5–15.5)
MCH: 32.1 pg (ref 27.3–33.6)
MCH: 32.2 pg (ref 27.3–33.6)
MCHC: 33.8 g/dL (ref 32.2–36.5)
MCHC: 34.1 g/dL (ref 32.2–36.5)
MCV: 95 fL (ref 81–98)
MCV: 95 fL (ref 81–98)
Platelet Count: 221 10*3/uL (ref 150–400)
Platelet Count: 207 10*3/uL (ref 150–400)
RBC: 2.96 10*6/uL — ABNORMAL LOW (ref 3.80–5.00)
RBC: 2.95 10*6/uL — ABNORMAL LOW (ref 3.80–5.00)
RDW-CV: 11.6 % (ref 11.6–14.4)
RDW-CV: 11.8 % (ref 11.6–14.4)
WBC: 7.3 10*3/uL (ref 4.30–10.00)
WBC: 7.76 10*3/uL (ref 4.30–10.00)

## 2018-12-30 LAB — BASIC METABOLIC PANEL
Anion Gap: 9 (ref 4–12)
Calcium: 8.7 mg/dL — ABNORMAL LOW (ref 8.9–10.2)
Carbon Dioxide, Total: 23 meq/L (ref 22–32)
Chloride: 105 meq/L (ref 98–108)
Creatinine: 1.34 mg/dL — ABNORMAL HIGH (ref 0.38–1.02)
GFR, Calc, African American: 45 mL/min/{1.73_m2} — ABNORMAL LOW (ref >59)
GFR, Calc, European American: 37 mL/min/{1.73_m2} — ABNORMAL LOW (ref >59)
Glucose: 99 mg/dL (ref 62–125)
Potassium: 3.9 meq/L (ref 3.6–5.2)
Sodium: 137 meq/L (ref 135–145)
Urea Nitrogen: 22 mg/dL — ABNORMAL HIGH (ref 8–21)

## 2018-12-30 LAB — EKG 12 LEAD
Atrial Rate: 68 {beats}/min
Diagnosis: NORMAL
P Axis: 31 degrees
P-R Interval: 194 ms
Q-T Interval: 410 ms
QRS Duration: 84 ms
QTC Calculation: 435 ms
R Axis: -20 degrees
T Axis: 50 degrees
Ventricular Rate: 68 {beats}/min

## 2018-12-30 LAB — LAB ADD ON ORDER

## 2018-12-30 LAB — FOLATE & VITAMIN B12
Folate, SRM: 9.8 ng/mL (ref >5.8)
Vitamin B12 (Cobalamin): 119 pg/mL — ABNORMAL LOW (ref 180–914)

## 2018-12-30 LAB — FERRITIN: Ferritin: 86 ng/mL (ref 10–180)

## 2018-12-30 LAB — TSH WITH REFLEXIVE FREE T4: TSH with Reflexive Free T4: 2.047 u[IU]/mL (ref 0.400–5.000)

## 2018-12-31 LAB — CBC (HEMOGRAM)
Hematocrit: 28 % — ABNORMAL LOW (ref 36–45)
Hemoglobin: 9.6 g/dL — ABNORMAL LOW (ref 11.5–15.5)
MCH: 32.4 pg (ref 27.3–33.6)
MCHC: 34.8 g/dL (ref 32.2–36.5)
MCV: 93 fL (ref 81–98)
Platelet Count: 214 10*3/uL (ref 150–400)
RBC: 2.96 10*6/uL — ABNORMAL LOW (ref 3.80–5.00)
RDW-CV: 11.8 % (ref 11.6–14.4)
WBC: 7.19 10*3/uL (ref 4.30–10.00)

## 2018-12-31 LAB — BASIC METABOLIC PANEL
Anion Gap: 8 (ref 4–12)
Calcium: 8.9 mg/dL (ref 8.9–10.2)
Carbon Dioxide, Total: 25 meq/L (ref 22–32)
Chloride: 104 meq/L (ref 98–108)
Creatinine: 1.31 mg/dL — ABNORMAL HIGH (ref 0.38–1.02)
GFR, Calc, African American: 46 mL/min/{1.73_m2} — ABNORMAL LOW (ref >59)
GFR, Calc, European American: 38 mL/min/{1.73_m2} — ABNORMAL LOW (ref >59)
Glucose: 107 mg/dL (ref 62–125)
Potassium: 4 meq/L (ref 3.6–5.2)
Sodium: 137 meq/L (ref 135–145)
Urea Nitrogen: 20 mg/dL (ref 8–21)

## 2018-12-31 LAB — R/O MRSA

## 2019-01-01 LAB — BASIC METABOLIC PANEL
Anion Gap: 9 (ref 4–12)
Calcium: 9.5 mg/dL (ref 8.9–10.2)
Carbon Dioxide, Total: 25 meq/L (ref 22–32)
Chloride: 105 meq/L (ref 98–108)
Creatinine: 1.33 mg/dL — ABNORMAL HIGH (ref 0.38–1.02)
GFR, Calc, African American: 45 mL/min/{1.73_m2} — ABNORMAL LOW (ref >59)
GFR, Calc, European American: 37 mL/min/{1.73_m2} — ABNORMAL LOW (ref >59)
Glucose: 101 mg/dL (ref 62–125)
Potassium: 3.9 meq/L (ref 3.6–5.2)
Sodium: 139 meq/L (ref 135–145)
Urea Nitrogen: 20 mg/dL (ref 8–21)

## 2019-01-01 LAB — SODIUM FRACTIONAL EXCRETION, URN
Creatinine/Unit, Urine: 76 mg/dL
Sodium Fractional Excretion: 0.5 %
Sodium, URN: 43 meq/L

## 2019-01-01 LAB — CBC (HEMOGRAM)
Hematocrit: 29 % — ABNORMAL LOW (ref 36–45)
Hemoglobin: 9.6 g/dL — ABNORMAL LOW (ref 11.5–15.5)
MCH: 32.4 pg (ref 27.3–33.6)
MCHC: 33.6 g/dL (ref 32.2–36.5)
MCV: 97 fL (ref 81–98)
Platelet Count: 235 10*3/uL (ref 150–400)
RBC: 2.96 10*6/uL — ABNORMAL LOW (ref 3.80–5.00)
RDW-CV: 12 % (ref 11.6–14.4)
WBC: 8.2 10*3/uL (ref 4.30–10.00)

## 2019-01-01 LAB — URINALYSIS COMPLETE, URN
Bilirubin (Qual), URN: NEGATIVE
Epith Cells_Renal/Trans,URN: NEGATIVE /HPF
Glucose Qual, URN: NEGATIVE mg/dL
Ketones, URN: NEGATIVE mg/dL
Leukocyte Esterase, URN: POSITIVE — AB
Nitrite, URN: NEGATIVE
Occult Blood, URN: NEGATIVE
Protein (Alb Semiquant), URN: NEGATIVE mg/dL
RBC, URN: NEGATIVE /HPF
Specific Gravity, URN: 1.011 g/mL (ref 1.006–1.027)
WBC, URN: NEGATIVE /HPF
pH, URN: 5.5 (ref 5.0–8.0)

## 2019-01-02 DIAGNOSIS — R262 Difficulty in walking, not elsewhere classified: Secondary | ICD-10-CM

## 2019-01-02 LAB — BASIC METABOLIC PANEL
Anion Gap: 8 (ref 4–12)
Calcium: 9.4 mg/dL (ref 8.9–10.2)
Carbon Dioxide, Total: 24 meq/L (ref 22–32)
Chloride: 107 meq/L (ref 98–108)
Creatinine: 1.3 mg/dL — ABNORMAL HIGH (ref 0.38–1.02)
GFR, Calc, African American: 47 mL/min/{1.73_m2} — ABNORMAL LOW (ref >59)
GFR, Calc, European American: 38 mL/min/{1.73_m2} — ABNORMAL LOW (ref >59)
Glucose: 94 mg/dL (ref 62–125)
Potassium: 4.1 meq/L (ref 3.6–5.2)
Sodium: 139 meq/L (ref 135–145)
Urea Nitrogen: 17 mg/dL (ref 8–21)

## 2019-01-03 DIAGNOSIS — M9712XD Periprosthetic fracture around internal prosthetic left knee joint, subsequent encounter: Secondary | ICD-10-CM | POA: Diagnosis not present

## 2019-01-03 DIAGNOSIS — R269 Unspecified abnormalities of gait and mobility: Secondary | ICD-10-CM | POA: Diagnosis not present

## 2019-01-03 DIAGNOSIS — I129 Hypertensive chronic kidney disease with stage 1 through stage 4 chronic kidney disease, or unspecified chronic kidney disease: Secondary | ICD-10-CM | POA: Diagnosis not present

## 2019-01-03 DIAGNOSIS — J9811 Atelectasis: Secondary | ICD-10-CM | POA: Diagnosis present

## 2019-01-03 DIAGNOSIS — R0602 Shortness of breath: Secondary | ICD-10-CM | POA: Diagnosis not present

## 2019-01-03 DIAGNOSIS — E1122 Type 2 diabetes mellitus with diabetic chronic kidney disease: Secondary | ICD-10-CM | POA: Diagnosis not present

## 2019-01-03 DIAGNOSIS — R05 Cough: Secondary | ICD-10-CM | POA: Diagnosis not present

## 2019-01-03 DIAGNOSIS — M978XXA Periprosthetic fracture around other internal prosthetic joint, initial encounter: Secondary | ICD-10-CM | POA: Diagnosis not present

## 2019-01-03 DIAGNOSIS — E871 Hypo-osmolality and hyponatremia: Secondary | ICD-10-CM | POA: Diagnosis not present

## 2019-01-03 DIAGNOSIS — M6281 Muscle weakness (generalized): Secondary | ICD-10-CM | POA: Diagnosis not present

## 2019-01-03 DIAGNOSIS — N183 Chronic kidney disease, stage 3 (moderate): Secondary | ICD-10-CM | POA: Diagnosis not present

## 2019-01-03 DIAGNOSIS — J069 Acute upper respiratory infection, unspecified: Secondary | ICD-10-CM | POA: Diagnosis not present

## 2019-01-03 DIAGNOSIS — S72402D Unspecified fracture of lower end of left femur, subsequent encounter for closed fracture with routine healing: Secondary | ICD-10-CM | POA: Diagnosis not present

## 2019-01-03 DIAGNOSIS — D649 Anemia, unspecified: Secondary | ICD-10-CM | POA: Diagnosis not present

## 2019-01-03 DIAGNOSIS — M25559 Pain in unspecified hip: Secondary | ICD-10-CM | POA: Diagnosis not present

## 2019-01-03 DIAGNOSIS — J209 Acute bronchitis, unspecified: Secondary | ICD-10-CM | POA: Diagnosis not present

## 2019-01-03 DIAGNOSIS — Z736 Limitation of activities due to disability: Secondary | ICD-10-CM | POA: Diagnosis not present

## 2019-01-03 DIAGNOSIS — N179 Acute kidney failure, unspecified: Secondary | ICD-10-CM | POA: Diagnosis not present

## 2019-01-03 DIAGNOSIS — S72402A Unspecified fracture of lower end of left femur, initial encounter for closed fracture: Secondary | ICD-10-CM | POA: Diagnosis not present

## 2019-01-03 DIAGNOSIS — Z96649 Presence of unspecified artificial hip joint: Secondary | ICD-10-CM | POA: Diagnosis not present

## 2019-01-03 DIAGNOSIS — Z4789 Encounter for other orthopedic aftercare: Secondary | ICD-10-CM

## 2019-01-07 DIAGNOSIS — D649 Anemia, unspecified: Secondary | ICD-10-CM | POA: Diagnosis not present

## 2019-01-07 DIAGNOSIS — M25559 Pain in unspecified hip: Secondary | ICD-10-CM | POA: Diagnosis not present

## 2019-01-07 DIAGNOSIS — R05 Cough: Secondary | ICD-10-CM | POA: Diagnosis not present

## 2019-01-07 DIAGNOSIS — R0602 Shortness of breath: Secondary | ICD-10-CM | POA: Diagnosis not present

## 2019-01-08 DIAGNOSIS — R0602 Shortness of breath: Secondary | ICD-10-CM | POA: Diagnosis not present

## 2019-01-09 DIAGNOSIS — D649 Anemia, unspecified: Secondary | ICD-10-CM | POA: Diagnosis not present

## 2019-01-09 DIAGNOSIS — J069 Acute upper respiratory infection, unspecified: Secondary | ICD-10-CM | POA: Diagnosis not present

## 2019-01-09 DIAGNOSIS — S72402A Unspecified fracture of lower end of left femur, initial encounter for closed fracture: Secondary | ICD-10-CM | POA: Diagnosis not present

## 2019-01-12 LAB — HEPATIC FUNCTION PANEL
ALT: 16 (ref 7–35)
AST: 32 (ref 13–35)
Alkaline Phosphatase: 104 (ref 25–125)
Bilirubin, Total: 0.5

## 2019-01-12 LAB — BASIC METABOLIC PANEL
BUN: 13 (ref 4–21)
Creatinine: 1.2 — AB (ref 0.5–1.1)
Glucose: 106
Sodium: 132 — AB (ref 137–147)

## 2019-01-12 LAB — IRON,TIBC AND FERRITIN PANEL: Ferritin: 119

## 2019-01-14 ENCOUNTER — Other Ambulatory Visit: Payer: Self-pay

## 2019-01-14 DIAGNOSIS — D649 Anemia, unspecified: Secondary | ICD-10-CM | POA: Diagnosis not present

## 2019-01-14 DIAGNOSIS — N179 Acute kidney failure, unspecified: Secondary | ICD-10-CM | POA: Diagnosis not present

## 2019-01-14 DIAGNOSIS — M25559 Pain in unspecified hip: Secondary | ICD-10-CM | POA: Diagnosis not present

## 2019-01-14 DIAGNOSIS — M6281 Muscle weakness (generalized): Secondary | ICD-10-CM | POA: Diagnosis not present

## 2019-01-14 MED ORDER — LEVOTHYROXINE SODIUM 50 MCG PO TABS: 50.0000 ug | ORAL_TABLET | Freq: Every day | ORAL | 0 refills | Status: DC

## 2019-01-14 NOTE — Telephone Encounter (Signed)
Refilled: 12/11/2018 Last OV: 01/16/2018 Next OV: 01/31/2019 Last TSH: 07/31/2018

## 2019-01-15 DIAGNOSIS — J9811 Atelectasis: Secondary | ICD-10-CM | POA: Diagnosis not present

## 2019-01-16 ENCOUNTER — Ambulatory Visit (HOSPITAL_BASED_OUTPATIENT_CLINIC_OR_DEPARTMENT_OTHER)
Payer: Medicare PPO | Attending: Orthopaedic Surgery | Admitting: Student in an Organized Health Care Education/Training Program

## 2019-01-16 ENCOUNTER — Encounter (HOSPITAL_BASED_OUTPATIENT_CLINIC_OR_DEPARTMENT_OTHER): Payer: Self-pay | Admitting: Orthopaedic Surgery

## 2019-01-16 VITALS — BP 140/60 | HR 72 | Temp 97.9°F | Resp 18 | Ht 66.0 in | Wt 180.0 lb

## 2019-01-16 DIAGNOSIS — J209 Acute bronchitis, unspecified: Secondary | ICD-10-CM | POA: Diagnosis not present

## 2019-01-16 DIAGNOSIS — M978XXA Periprosthetic fracture around other internal prosthetic joint, initial encounter: Secondary | ICD-10-CM | POA: Diagnosis not present

## 2019-01-16 DIAGNOSIS — Z96649 Presence of unspecified artificial hip joint: Secondary | ICD-10-CM | POA: Diagnosis not present

## 2019-01-16 DIAGNOSIS — E871 Hypo-osmolality and hyponatremia: Secondary | ICD-10-CM | POA: Diagnosis not present

## 2019-01-16 LAB — CBC AND DIFFERENTIAL
HCT: 341 — AB (ref 36–46)
Hemoglobin: 9.5 — AB (ref 12.0–16.0)
WBC: 5.3

## 2019-01-16 NOTE — Progress Notes (Signed)
ORTHOPAEDIC TRAUMA CLINIC      L&I NO  Outside, Referring Provider  Chief Complaint:  Left supracondylar periprosthetic femur fracture    DOS:  12/29/18    Interval History:  Mrs. Brooke Newman is a very pleasant 85 year old female who is visiting family in [place] when she sustained a ground-level fall.  She landed on her left leg and sustained a left supracondylar periprosthetic femoral shaft fracture she was subsequently fixed with an intramedullary nail on December 29, 2018.  She has remained in [place] at a skilled nursing facility for the past 2 weeks before going back [place].  The patient intends to travel back to [place] on February 20.  She currently lives in a skilled nursing facility there as well.  The patient has been weightbearing as tolerated and has had up to 4 minutes of walking uninterrupted since leaving the hospital.  She only needs Tylenol for pain medication.  She has worked with physical therapy and is continuing with her Lovenox injections for DVT prophylaxis.      Pertinent ROSthe patient denies any headaches, nausea, abdominal pain, chest pains, sob    Patient is on DVT prophylaxis      Current Outpatient Medications:   .  acetaminophen 500 MG tablet, Take 1,000 mg by mouth 3 times a day., Disp: , Rfl:   .  cholecalciferol 25 MCG (1000 UT) tablet, Take 1,000 Units by mouth daily., Disp: , Rfl:   .  cyanocobalamin 1000 MCG capsule, Take 1,000 mcg by mouth daily., Disp: , Rfl:   .  enoxaparin 30 MG/0.3ML injection, Inject 30 mg under the skin daily., Disp: , Rfl:   .  guaiFENesin ER 600 MG 12 hr tablet, Take 1,200 mg by mouth every 12 hours., Disp: , Rfl:   .  ipratropium-albuterol 0.5-2.5 (3) MG/3ML nebulizer solution, Inhale 3 mL via nebulizer 4 times a day. Can also give 4 times daily prn., Disp: , Rfl:   .  levothyroxine 50 MCG tablet, Take 50 mcg by mouth daily on an empty stomach., Disp: , Rfl:   .  Multiple Vitamins-Minerals (PRESERVISION AREDS 2+MULTI VIT OR),  Take 1 tablet by mouth 2 times a day., Disp: , Rfl:   .  polyethylene glycol 3350 oral powder, Take 17 g by mouth daily as needed for constipation. Fill cap with powder to the 17 gram mark and dissolve in 4 to 8 ounces of water., Disp: , Rfl:   .  predniSONE 20 MG tablet, Take 20 mg by mouth daily., Disp: , Rfl:   .  Sennosides (senna) 8.6 MG tablet, Take 17.2 mg by mouth 2 times a day as needed for constipation., Disp: , Rfl:     There is no problem list on file for this patient.        Physical Exam:   General: No apparent distress  Respiratory: Non-labored breathing:     Vitals:    01/16/19 1221   BP: (!) 140/60   BP Cuff Size: Regular   BP Site: Left Arm   BP Position: Sitting   Pulse: 72   Resp: 18   Temp: 97.9 F (36.6 C)   TempSrc: Temporal   SpO2: 97%   Weight: 180 lb (81.6 kg)   Height: 5\' 6"  (1.676 m)       Musculoskeletal Lower exam:   Physical exam of the left  Skin: Exam of the patients skin revwell-healing surgical incisions with sutures intact  Tenderness: the patient denies  any tenderness to palpation anywhere on her left leg  Sensation: Sensation to light touch is present in the distribution of the superficial peroneal and deep peroneal nerves/tibial/saph/sural nerves.   Motor: The patient is able to fire the EHL/FHL/GSC/TA  Range of motion:  Hip:0-90 degrees  Knees:0-115   Vascular: right dorsalis pedis and posterior tibial pulses present    No x-rays were obtained at this clinic visit    Impression:   85 year old female who is now  2 weeks s/p an intramedullary nail for a left supracondylar periprosthetic femur fracture.  The patient is doing quite well and has RD been up and walking for up to 5 minutes at a time.  The patient is flying back to [place] in 1 week where she will have adequate follow-up at either [facility] or the [facility].  2 orthopedic trauma physicians names were given to the patient and her family for referral and adequate follow-up.  The patient's  family and the patient were instructed to follow-up in approximately 4 weeks with x-ray imaging of the left femur.  The patient can continue to take Tylenol for pain control and should continue with physical therapy both here in [place] and back at home in [place].  The patient should continue on her DVT prophylaxis with Lovenox until 6 weeks after her surgery date.    Emmit Pomfret, MD, PGY1  [facility]  Department of Orthopaedics and Sports Medicine

## 2019-01-16 NOTE — Progress Notes (Signed)
Per provider's order, patient's sutures were removed.       Wound ASSESSMENT    Incision: Left Upper Leg  Drainage: None  Redness: Yes  Odor: None  Swelling: None  Steri strips applied:  Yes   Patient tolerated procedure well: Yes   Time taken to complete procedure:  20 minutes  Double Check Suture/Staples Removal: Yes       PATIENT EDUCATION    Primary learner: Patient  Interpreter used: None    Patient given the following suture/staple removal aftercare instructions:    ---Do not shower for 24 hours.   ---Do not scrub, or remove steri strips.   ---Allow steri strips to fall off naturally.  ---Do not use lotions or harsh soaps.  ---If you notice redness, swelling, or discharge from the incision site, please call the clinic at [phone] to assess for infection.  ---Call the clinic if you have any additional questions/concerns.    [employer], CMA

## 2019-01-16 NOTE — Patient Instructions (Signed)
Please follow up with an orthopaedic trauma surgeon when you get home in 4 weeks (approx 02/13/19) for xrays of the left femur.  You can continue to be weight bearing as tolerated on your left leg. Continue to take your lovenox injections for the next 4 weeks (or until you run out). Continue with physical therapy as this will help you ambulate. Tylenol for pain relief is OK.

## 2019-01-19 DIAGNOSIS — D649 Anemia, unspecified: Secondary | ICD-10-CM | POA: Diagnosis not present

## 2019-01-27 DIAGNOSIS — Z741 Need for assistance with personal care: Secondary | ICD-10-CM | POA: Diagnosis not present

## 2019-01-27 DIAGNOSIS — M6281 Muscle weakness (generalized): Secondary | ICD-10-CM | POA: Diagnosis not present

## 2019-01-27 DIAGNOSIS — R278 Other lack of coordination: Secondary | ICD-10-CM | POA: Diagnosis not present

## 2019-01-27 DIAGNOSIS — R2681 Unsteadiness on feet: Secondary | ICD-10-CM | POA: Diagnosis not present

## 2019-01-28 DIAGNOSIS — R2681 Unsteadiness on feet: Secondary | ICD-10-CM | POA: Diagnosis not present

## 2019-01-28 DIAGNOSIS — Z741 Need for assistance with personal care: Secondary | ICD-10-CM | POA: Diagnosis not present

## 2019-01-28 DIAGNOSIS — M6281 Muscle weakness (generalized): Secondary | ICD-10-CM | POA: Diagnosis not present

## 2019-01-28 DIAGNOSIS — R278 Other lack of coordination: Secondary | ICD-10-CM | POA: Diagnosis not present

## 2019-01-29 ENCOUNTER — Ambulatory Visit
Admission: RE | Admit: 2019-01-29 | Discharge: 2019-01-29 | Disposition: A | Discharge status: Home / Self Care | Payer: Medicare HMO | Attending: Nephrology | Admitting: Nephrology

## 2019-01-29 ENCOUNTER — Other Ambulatory Visit: Payer: Self-pay | Admitting: Nephrology

## 2019-01-29 ENCOUNTER — Ambulatory Visit
Admission: RE | Admit: 2019-01-29 | Discharge: 2019-01-29 | Disposition: A | Discharge status: Home / Self Care | Payer: Medicare HMO | Source: Ambulatory Visit | Attending: Nephrology | Admitting: Nephrology

## 2019-01-29 DIAGNOSIS — N183 Chronic kidney disease, stage 3 (moderate): Secondary | ICD-10-CM | POA: Diagnosis not present

## 2019-01-29 DIAGNOSIS — I129 Hypertensive chronic kidney disease with stage 1 through stage 4 chronic kidney disease, or unspecified chronic kidney disease: Secondary | ICD-10-CM | POA: Diagnosis not present

## 2019-01-29 DIAGNOSIS — J9811 Atelectasis: Secondary | ICD-10-CM

## 2019-01-29 DIAGNOSIS — R2681 Unsteadiness on feet: Secondary | ICD-10-CM | POA: Diagnosis not present

## 2019-01-29 DIAGNOSIS — R278 Other lack of coordination: Secondary | ICD-10-CM | POA: Diagnosis not present

## 2019-01-29 DIAGNOSIS — Z741 Need for assistance with personal care: Secondary | ICD-10-CM | POA: Diagnosis not present

## 2019-01-29 DIAGNOSIS — E871 Hypo-osmolality and hyponatremia: Secondary | ICD-10-CM | POA: Diagnosis not present

## 2019-01-29 DIAGNOSIS — E1122 Type 2 diabetes mellitus with diabetic chronic kidney disease: Secondary | ICD-10-CM | POA: Diagnosis not present

## 2019-01-29 DIAGNOSIS — M6281 Muscle weakness (generalized): Secondary | ICD-10-CM | POA: Diagnosis not present

## 2019-01-30 DIAGNOSIS — M6281 Muscle weakness (generalized): Secondary | ICD-10-CM | POA: Diagnosis not present

## 2019-01-30 DIAGNOSIS — R278 Other lack of coordination: Secondary | ICD-10-CM | POA: Diagnosis not present

## 2019-01-30 DIAGNOSIS — R2681 Unsteadiness on feet: Secondary | ICD-10-CM | POA: Diagnosis not present

## 2019-01-30 DIAGNOSIS — Z741 Need for assistance with personal care: Secondary | ICD-10-CM | POA: Diagnosis not present

## 2019-01-30 NOTE — Progress Notes (Signed)
I saw and evaluated the patient. I have reviewed the resident's documentation and I agree with the plan as described in the note.

## 2019-01-31 ENCOUNTER — Ambulatory Visit: Payer: Medicare HMO | Admitting: Internal Medicine

## 2019-01-31 ENCOUNTER — Ambulatory Visit (INDEPENDENT_AMBULATORY_CARE_PROVIDER_SITE_OTHER): Payer: Medicare HMO

## 2019-01-31 ENCOUNTER — Encounter: Payer: Self-pay | Admitting: Internal Medicine

## 2019-01-31 VITALS — BP 132/64 | HR 71 | Temp 97.6°F | Resp 16 | Ht 66.0 in | Wt 177.8 lb

## 2019-01-31 DIAGNOSIS — Z0000 Encounter for general adult medical examination without abnormal findings: Secondary | ICD-10-CM | POA: Diagnosis not present

## 2019-01-31 DIAGNOSIS — R2681 Unsteadiness on feet: Secondary | ICD-10-CM | POA: Diagnosis not present

## 2019-01-31 DIAGNOSIS — N183 Chronic kidney disease, stage 3 unspecified: Secondary | ICD-10-CM

## 2019-01-31 DIAGNOSIS — Z741 Need for assistance with personal care: Secondary | ICD-10-CM | POA: Diagnosis not present

## 2019-01-31 DIAGNOSIS — I10 Essential (primary) hypertension: Secondary | ICD-10-CM

## 2019-01-31 DIAGNOSIS — J22 Unspecified acute lower respiratory infection: Secondary | ICD-10-CM

## 2019-01-31 DIAGNOSIS — D62 Acute posthemorrhagic anemia: Secondary | ICD-10-CM | POA: Diagnosis not present

## 2019-01-31 DIAGNOSIS — M6281 Muscle weakness (generalized): Secondary | ICD-10-CM | POA: Diagnosis not present

## 2019-01-31 DIAGNOSIS — R278 Other lack of coordination: Secondary | ICD-10-CM | POA: Diagnosis not present

## 2019-01-31 MED ORDER — TELMISARTAN 20 MG PO TABS: 20.0000 mg | ORAL_TABLET | Freq: Every day | ORAL | 5 refills | Status: DC

## 2019-01-31 NOTE — Patient Instructions (Addendum)
  Pamela Paul , Thank you for taking time to come for your Medicare Wellness Visit. I appreciate your ongoing commitment to your health goals. Please review the following plan we discussed and let me know if I can assist you in the future.   Bring a copy of your [place] and/or Living Will to be scanned into chart.  Have a great day!  These are the goals we discussed: Goals      Patient Stated   . Increase physical activity (pt-stated)     Resume exercise regimen of walking 5 days weekly for 60 min       This is a list of the screening recommended for you and due dates:  Health Maintenance  Topic Date Due  . Tetanus Vaccine  02/15/2023  . Flu Shot  Completed  . DEXA scan (bone density measurement)  Completed  . Pneumonia vaccines  Completed

## 2019-01-31 NOTE — Patient Instructions (Addendum)
We are stopping enalapril and starting telmisartan in stead due to recent reports of angioedema causing airway compromise.   Take it at bedtime instead of morning.    Goal blood pressure  is 130/80 or less but not below 120/70.   Reduce dose to 10 mg if needed .   Treat your pain !   You can take up to 2000 mg of acetominophen (tylenol) every day safely  In divided doses (500 mg every 6 hours  Or 1000 mg every 12 hours.)  PRE TREAT  PRIOR TO PT SO YOU CAN PARTICIPATE FULLY    You can use robitussin or Delsym  For the cough suppressant.

## 2019-01-31 NOTE — Progress Notes (Signed)
Subjective:  Patient ID: Pamela Paul, female    DOB: 1926-06-25  Age: 85 y.o. MRN: [id number]  CC: Diagnoses of Essential hypertension, Acute blood loss as cause of postoperative anemia, CKD (chronic kidney disease) stage 3, GFR 30-59 ml/min (HCC), and Lower respiratory infection (e.g., bronchitis, pneumonia, pneumonitis, pulmonitis) were pertinent to this visit.  HPI Pamela Paul presents for follow up on recent hospitalization in [place] for mechanical fall with fracture.  Patient fell during visit to daughter while exiting a  hot tub.  She suffered a left peri prosthetic distal femur fracture and was admitted to hospital on Jan 26 and .  Underwent a retrograde inrtramedullary femoral nail.   Recovery was complicated by hypotension, hyponatremia, post operative anemia and lower respiratory infection following  history of in house RSV exposure. Received one unit or PRBCs , B12 injections for low B12 level duonebs and mucinex along with a Zpack and prednisone  for bronchitis.  She was discharged on Lovenox to local skilled nursing facility on Jan 31 to receive PT.  She was discharged from rehab facility on Feb 20 to [place] facility and is accompanied by her other daughter today to her appointment .  Orthopedics follow was arranged by orthopedist  at [facility]. With  Drue Dun in [facility]    She feels generally well. Reports that she continues to cough intermittently but this is  Improving.   no longer on prednisone using albuterol MDI .  On Lovenox until March 12  30 mg Once daily at night for DVT prophylaxis .  Ambulating well   Enalapril was suspended for elevated CR and hypotension. Discussed recent increased reports of angioedema with ACE Inhibitors and need for change to ARB   Lab Results  Component Value Date   CREATININE 1.2 (A) 01/12/2019     Post op anemia   Outpatient Medications Prior to Visit  Medication Sig Dispense Refill  . albuterol (PROVENTIL HFA;VENTOLIN HFA) 108  (90 Base) MCG/ACT inhaler     . cholecalciferol (VITAMIN D) 1000 units tablet Take 1,000 Units by mouth daily.    Marland Kitchen enoxaparin (LOVENOX) 30 MG/0.3ML injection     . guaiFENesin (MUCINEX) 600 MG 12 hr tablet Take by mouth.    Marland Kitchen ipratropium-albuterol (DUONEB) 0.5-2.5 (3) MG/3ML SOLN Inhale into the lungs.    Marland Kitchen levothyroxine (SYNTHROID, LEVOTHROID) 50 MCG tablet Take 1 tablet (50 mcg total) by mouth daily. 90 tablet 0  . Multiple Vitamins-Minerals (PRESERVISION AREDS PO) Take 1 capsule by mouth 2 (two) times daily.    . NON FORMULARY 1 capsule Two (2) times a day. Tumeric Herb    . polyethylene glycol powder (GLYCOLAX/MIRALAX) powder Take by mouth.    . senna (SENOKOT) 8.6 MG TABS tablet Take by mouth.    . enalapril (VASOTEC) 10 MG tablet Take 1 tablet (10 mg total) by mouth daily. 90 tablet 1   No facility-administered medications prior to visit.     Review of Systems;  Patient denies headache, fevers, malaise, unintentional weight loss, skin rash, eye pain, sinus congestion and sinus pain, sore throat, dysphagia,  hemoptysis , cough, dyspnea, wheezing, chest pain, palpitations, orthopnea, edema, abdominal pain, nausea, melena, diarrhea, constipation, flank pain, dysuria, hematuria, urinary  Frequency, nocturia, numbness, tingling, seizures,  Focal weakness, Loss of consciousness,  Tremor, insomnia, depression, anxiety, and suicidal ideation.      Objective:  BP 132/64 (BP Location: Left Arm, Patient Position: Sitting, Cuff Size: Large)   Pulse 71   Temp 97.6  F (36.4 C) (Oral)   Resp 16   Ht 5\' 6"  (1.676 m)   Wt 177 lb 1.9 oz (80.3 kg)   SpO2 97%   BMI 28.59 kg/m   BP Readings from Last 3 Encounters:  01/31/19 132/64  01/31/19 132/64  07/31/18 128/70    Wt Readings from Last 3 Encounters:  01/31/19 177 lb 12.8 oz (80.6 kg)  01/31/19 177 lb 1.9 oz (80.3 kg)  07/31/18 183 lb 2 oz (83.1 kg)    General appearance: alert, cooperative and appears stated age Ears: normal TM's  and external ear canals both ears Throat: lips, mucosa, and tongue normal; teeth and gums normal Neck: no adenopathy, no carotid bruit, supple, symmetrical, trachea midline and thyroid not enlarged, symmetric, no tenderness/mass/nodules Back: symmetric, no curvature. ROM normal. No CVA tenderness. Lungs: clear to auscultation bilaterally Heart: regular rate and rhythm, S1, S2 normal, no murmur, click, rub or gallop Abdomen: soft, non-tender; bowel sounds normal; no masses,  no organomegaly Pulses: 2+ and symmetric Skin: Skin color, texture, turgor normal. No rashes or lesions Lymph nodes: Cervical, supraclavicular, and axillary nodes normal.  No results found for: HGBA1C  Lab Results  Component Value Date   CREATININE 1.2 (A) 01/12/2019   CREATININE 1.60 (H) 07/31/2018   CREATININE 1.38 (H) 01/16/2018    Lab Results  Component Value Date   WBC 5.3 01/16/2019   HGB 9.5 (A) 01/16/2019   HCT 341 (A) 01/16/2019   PLT 296.0 07/31/2018   GLUCOSE 107 (H) 07/31/2018   CHOL 206 (H) 07/31/2018   TRIG 172.0 (H) 07/31/2018   HDL 47.90 07/31/2018   LDLDIRECT 132.0 07/12/2017   LDLCALC 124 (H) 07/31/2018   ALT 16 01/12/2019   AST 32 01/12/2019   NA 132 (A) 01/12/2019   K 4.7 07/31/2018   CL 104 07/31/2018   CREATININE 1.2 (A) 01/12/2019   BUN 13 01/12/2019   CO2 24 07/31/2018   TSH 2.47 07/31/2018    Dg Chest 2 View  Result Date: 01/30/2019 CLINICAL DATA:  LEFT leg surgery in [place] 4 weeks ago, atelectasis on chest radiograph, follow-up EXAM: CHEST - 2 VIEW COMPARISON:  None FINDINGS: Normal heart size and pulmonary vascularity. Atherosclerotic calcification and mild tortuosity of thoracic aorta. Mediastinal contours otherwise normal. Bronchitic changes without infiltrate, pleural effusion, or pneumothorax. Bones demineralized with scattered degenerative disc disease changes of the thoracic spine. Surgical clips LEFT upper quadrant consistent with history of cholecystectomy.  IMPRESSION: Bronchitic changes without infiltrate. Electronically Signed   By: Lavonia Dana M.D.   On: 01/30/2019 08:39    Assessment & Plan:   Problem List Items Addressed This Visit    Lower respiratory infection (e.g., bronchitis, pneumonia, pneumonitis, pulmonitis)    Secondary to RSV by report.  Sputum cultures were negative and RLL ATX was noted on chest  Ray .  Symptoms are improving and she is not hypoxic       Hypertension    Enalapril dc'd during hospitalization for periprosthetic femur fracture Feb 2020 due to hypotension.  BP has now returned to baseline and therapy will be resumed with telmisartan      Relevant Medications   enoxaparin (LOVENOX) 30 MG/0.3ML injection   telmisartan (MICARDIS) 20 MG tablet   CKD (chronic kidney disease) stage 3, GFR 30-59 ml/min (HCC)    Renal function is at  baseline with avoidance of NSAIDs.  She is on an ARB  for control of hypertension.    Lab Results  Component Value Date  NA 132 (A) 01/12/2019   K 4.7 07/31/2018   CL 104 07/31/2018   CO2 24 07/31/2018   Lab Results  Component Value Date   CREATININE 1.2 (A) 01/12/2019         Acute blood loss as cause of postoperative anemia    She states that she has had repeat labs since returning to [place] by her nephrologist.  She is taking B12 supplements   Lab Results  Component Value Date   WBC 5.3 01/16/2019   HGB 9.5 (A) 01/16/2019   HCT 341 (A) 01/16/2019   MCV 94.7 07/31/2018   PLT 296.0 07/31/2018           A total of 40 minutes was spent with patient more than half of which was spent in counseling patient on the above mentioned issues , reviewing and explaining recent labs and imaging studies done, and coordination of care.  I have discontinued Pamela Paul's enalapril. I am also having her start on telmisartan. Additionally, I am having her maintain her cholecalciferol, NON FORMULARY, Multiple Vitamins-Minerals (PRESERVISION AREDS PO), levothyroxine, albuterol,  enoxaparin, guaiFENesin, ipratropium-albuterol, polyethylene glycol powder, and senna.  Meds ordered this encounter  Medications  . telmisartan (MICARDIS) 20 MG tablet    Sig: Take 1 tablet (20 mg total) by mouth daily.    Dispense:  30 tablet    Refill:  5    Medications Discontinued During This Encounter  Medication Reason  . enalapril (VASOTEC) 10 MG tablet     Follow-up: Return in about 6 months (around 08/01/2019).   [name], MD

## 2019-01-31 NOTE — Progress Notes (Signed)
Subjective:   Pamela Paul is a 85 y.o. female who presents for Medicare Annual (Subsequent) preventive examination.  Review of Systems:  No ROS.  Medicare Wellness Visit. Additional risk factors are reflected in the social history. Cardiac Risk Factors include: advanced age (>64men, >53 women)     Objective:     Vitals: BP 132/64 (BP Location: Left Arm, Patient Position: Sitting, Cuff Size: Normal)   Pulse 71   Temp 97.6 F (36.4 C) (Oral)   Resp 16   Ht 5\' 6"  (1.676 m)   Wt 177 lb 12.8 oz (80.6 kg)   SpO2 97%   BMI 28.70 kg/m   Body mass index is 28.7 kg/m.  Advanced Directives 01/31/2019 11/02/2017 10/11/2016  Does Patient Have a Medical Advance Directive? Yes Yes Yes  Type of Paramedic of Pamela Paul;Living will Out of facility DNR (pink MOST or yellow form) Pamela Paul;Living will  Does patient want to make changes to medical advance directive? No - Patient declined No - Patient declined -  Copy of [place] in Chart? No - copy requested - Yes  Pre-existing out of facility DNR order (yellow form or pink MOST form) - Yellow form placed in chart (order not valid for inpatient use) -    Tobacco Social History   Tobacco Use  Smoking Status Former Smoker  . Last attempt to quit: 03/15/1961  . Years since quitting: 57.9  Smokeless Tobacco Never Used  Tobacco Comment   Quit many years ago.     Counseling given: Not Answered Comment: Quit many years ago.   Clinical Intake:  Pre-visit preparation completed: No        Diabetes: No  How often do you need to have someone help you when you read instructions, pamphlets, or other written materials from your doctor or pharmacy?: 1 - Never  Interpreter Needed?: No     Past Medical History:  Diagnosis Date  . CKD (chronic kidney disease) stage 3, GFR 30-59 ml/min (HCC)   . Heart disease    mitral valvular  . Hyperlipidemia   . Hypertension   .  Hypothyroidism   . Spinal stenosis of lumbar region 2006   s/p surgery   Past Surgical History:  Procedure Laterality Date  . ABDOMINAL HYSTERECTOMY    . bilateral cataract surgery    . CHOLECYSTECTOMY  1980  . JOINT REPLACEMENT     Bilateral knee  . LAPAROSCOPIC SIGMOID COLECTOMY  2011   secondary to benign mass  . REPLACEMENT TOTAL KNEE BILATERAL    . SMALL INTESTINE SURGERY  2010   for SBO  . SPINE SURGERY  2006   [facility], [facility]  . tendon repair     achille heall, left    Family History  Problem Relation Age of Onset  . Arthritis Mother   . Stroke Mother   . Hyperlipidemia Father   . Cancer Father   . Heart disease Paternal Aunt    Social History   Socioeconomic History  . Marital status: Widowed    Spouse name: Not on file  . Number of children: Not on file  . Years of education: Not on file  . Highest education level: Not on file  Occupational History  . Not on file  Social Needs  . Financial resource strain: Not hard at all  . Food insecurity:    Worry: Never true    Inability: Never true  . Transportation needs:  Medical: No    Non-medical: No  Tobacco Use  . Smoking status: Former Smoker    Last attempt to quit: 03/15/1961    Years since quitting: 57.9  . Smokeless tobacco: Never Used  . Tobacco comment: Quit many years ago.  Substance and Sexual Activity  . Alcohol use: Not Currently  . Drug use: No  . Sexual activity: Never  Lifestyle  . Physical activity:    Days per week: 5 days    Minutes per session: 60 min  . Stress: Not at all  Relationships  . Social connections:    Talks on phone: Not on file    Gets together: Not on file    Attends religious service: Not on file    Active member of club or organization: Not on file    Attends meetings of clubs or organizations: Not on file    Relationship status: Not on file  Other Topics Concern  . Not on file  Social History Narrative   [place] lives    Outpatient Encounter  Medications as of 01/31/2019  Medication Sig  . albuterol (PROVENTIL HFA;VENTOLIN HFA) 108 (90 Base) MCG/ACT inhaler   . cholecalciferol (VITAMIN D) 1000 units tablet Take 1,000 Units by mouth daily.  Marland Kitchen enoxaparin (LOVENOX) 30 MG/0.3ML injection   . ipratropium-albuterol (DUONEB) 0.5-2.5 (3) MG/3ML SOLN Inhale into the lungs.  Marland Kitchen levothyroxine (SYNTHROID, LEVOTHROID) 50 MCG tablet Take 1 tablet (50 mcg total) by mouth daily.  . Multiple Vitamins-Minerals (PRESERVISION AREDS PO) Take 1 capsule by mouth 2 (two) times daily.  . NON FORMULARY 1 capsule Two (2) times a day. Tumeric Herb  . telmisartan (MICARDIS) 20 MG tablet Take 1 tablet (20 mg total) by mouth daily.   No facility-administered encounter medications on file as of 01/31/2019.     Activities of Daily Living In your present state of health, do you have any difficulty performing the following activities: 01/31/2019  Hearing? Y  Comment Hearing aids  Vision? N  Difficulty concentrating or making decisions? N  Comment Age appropriate  Walking or climbing stairs? Y  Comment Unsteady gait. Walker in use.   Dressing or bathing? Y  Comment Family currently assists   Doing errands, shopping? Y  Comment She does not Physiological scientist and eating ? Y  Comment Meal prep completed by family.  Self feeds.   Using the Toilet? N  In the past six months, have you accidently leaked urine? Y  Comment Managed with daily brief  Do you have problems with loss of bowel control? N  Managing your Medications? N  Managing your Finances? N  Housekeeping or managing your Housekeeping? Y  Comment Daughter/family assists   Some recent data might be hidden    Patient Care Team: [name], MD as PCP - General (Internal Medicine)    Assessment:   This is a routine wellness examination for Pamela Paul.  Health Screenings  Mammogram -06/16/12 Colonoscopy -06/19/13 Bone Density -11/23/16 Glaucoma -none Macular degeneration- yes Hearing  -hearing aids TSH- 07/31/18 (2.47) Cholesterol -07/31/18 (206) Dental- every 6 months Vision- Every 6 months. Next appt 03/18/19.  Social  Alcohol intake -no Smoking history- former Smokers in home? none Illicit drug use? none Exercise - PT x3 days weekly and OT x2 days weekly Diet -regular diet Sexually Active -never  Safety  Patient feels safe at home. She does have life alert in place.  Patient does have smoke detectors at home  Patient does wear sunscreen or  protective clothing when in direct sunlight Patient does wear seat belt when riding with others. She does not drive.   Activities of Daily Living Patient has assistance with household chores. Denies needing assistance with: feeding themselves, getting from bed to chair, getting to the toilet and managing money. Family currently assists with meal prep, bathing/showering, dressing.  Walker in use when ambulating.   Depression Screen Patient denies losing interest in daily life, feeling hopeless, or crying easily over simple problems.   Fall Screen Patient denies being afraid of falling. Most recent fall 1 month ago. Followed by pcp and ortho.  Currently participates in PT/OT.  Memory Screen Patient is alert, normal appearance, oriented to person/place/and time. Correctly identified the president of the [place], recall of 2/3 objects, and performing simple calculations. Stated months of the year correctly in reverse.  Patient displays appropriate judgement and can read correct time from watch face.   Immunizations The following Immunizations are up to date: Influenza, shingles, pneumonia, and tetanus.   Other Providers Patient Care Team: [name], MD as PCP - General (Internal Medicine)  Exercise Activities and Dietary recommendations Current Exercise Habits: Structured exercise class, Type of exercise: stretching(PT/ OT), Time (Minutes): 60, Frequency (Times/Week): 5, Weekly Exercise (Minutes/Week): 300, Intensity:  Mild  Goals      Patient Stated   . Increase physical activity (pt-stated)     Resume exercise regimen of walking 5 days weekly for 60 min       Fall Risk Fall Risk  01/31/2019 01/16/2018 11/02/2017 10/11/2016 04/26/2015  Falls in the past year? 1 No Yes Yes No  Number falls in past yr: 0 - 1 1 -  Injury with Fall? 1 - No Yes -  Comment She slipped getting out of the hot tub - Walked on uneven ground - -  Risk Factor Category  - - - High Fall Risk -  Risk for fall due to : - History of fall(s) - Impaired balance/gait -  Follow up Falls prevention discussed;Education provided - Falls prevention discussed;Education provided Falls prevention discussed;Education provided -  Comment - - Cane in use when out of the home, walker in use within the home. - -   Depression Screen PHQ 2/9 Scores 01/31/2019 11/02/2017 10/11/2016 04/26/2015  PHQ - 2 Score 0 0 0 0  PHQ- 9 Score - 0 - -     Cognitive Function MMSE - Mini Mental State Exam 10/11/2016  Orientation to time 5  Orientation to Place 5  Registration 3  Attention/ Calculation 5  Recall 3  Language- name 2 objects 2  Language- repeat 1  Language- follow 3 step command 3  Language- read & follow direction 1  Write a sentence 1  Copy design 1  Total score 30     6CIT Screen 01/31/2019 11/02/2017 10/11/2016  What Year? 0 points 0 points 0 points  What month? 0 points 0 points 0 points  What time? 0 points 0 points 0 points  Count back from 20 0 points 0 points 0 points  Months in reverse 0 points 0 points 0 points  Repeat phrase - 0 points 0 points  Total Score - 0 0    Immunization History  Administered Date(s) Administered  . Influenza Split 10/03/2014, 10/04/2015  . Influenza-Unspecified 09/03/2013, 10/01/2016, 10/06/2017, 09/18/2018  . Pneumococcal Conjugate-13 10/26/2014  . Pneumococcal Polysaccharide-23 04/04/1993, 09/20/2008, 01/28/2016  . Tdap 02/14/2013  . Zoster 12/08/2009  . Zoster Recombinat (Shingrix) 02/07/2017,  06/18/2017    Screening  Tests Health Maintenance  Topic Date Due  . TETANUS/TDAP  02/15/2023  . INFLUENZA VACCINE  Completed  . DEXA SCAN  Completed  . PNA vac Low Risk Adult  Completed      Plan:    End of life planning; Advance aging; Advanced directives discussed. Copy of current HCPOA/Living Will requested.    I have personally reviewed and noted the following in the patient's chart:   . Medical and social history . Use of alcohol, tobacco or illicit drugs  . Current medications and supplements . Functional ability and status . Nutritional status . Physical activity . Advanced directives . List of other physicians . Hospitalizations, surgeries, and ER visits in previous 12 months . Vitals . Screenings to include cognitive, depression, and falls . Referrals and appointments  In addition, I have reviewed and discussed with patient certain preventive protocols, quality metrics, and best practice recommendations. A written personalized care plan for preventive services as well as general preventive health recommendations were provided to patient.     OBrien-Blaney, Itzel Mckibbin L, LPN  2/82/0601    I have reviewed the above information and agree with above.   Deborra Medina, MD

## 2019-02-02 DIAGNOSIS — J22 Unspecified acute lower respiratory infection: Secondary | ICD-10-CM | POA: Insufficient documentation

## 2019-02-02 DIAGNOSIS — D62 Acute posthemorrhagic anemia: Secondary | ICD-10-CM | POA: Insufficient documentation

## 2019-02-02 NOTE — Assessment & Plan Note (Signed)
Secondary to RSV by report.  Sputum cultures were negative and RLL ATX was noted on chest  Ray .  Symptoms are improving and she is not hypoxic

## 2019-02-02 NOTE — Assessment & Plan Note (Signed)
Enalapril dc'd during hospitalization for periprosthetic femur fracture Feb 2020 due to hypotension.  BP has now returned to baseline and therapy will be resumed with telmisartan

## 2019-02-02 NOTE — Assessment & Plan Note (Signed)
She states that she has had repeat labs since returning to [facility] by her nephrologist.  She is taking B12 supplements   Lab Results  Component Value Date   WBC 5.3 01/16/2019   HGB 9.5 (A) 01/16/2019   HCT 341 (A) 01/16/2019   MCV 94.7 07/31/2018   PLT 296.0 07/31/2018

## 2019-02-02 NOTE — Assessment & Plan Note (Signed)
Renal function is at  baseline with avoidance of NSAIDs.  She is on an ARB  for control of hypertension.    Lab Results  Component Value Date   NA 132 (A) 01/12/2019   K 4.7 07/31/2018   CL 104 07/31/2018   CO2 24 07/31/2018   Lab Results  Component Value Date   CREATININE 1.2 (A) 01/12/2019

## 2019-02-03 DIAGNOSIS — S7292XS Unspecified fracture of left femur, sequela: Secondary | ICD-10-CM | POA: Diagnosis not present

## 2019-02-03 DIAGNOSIS — M6281 Muscle weakness (generalized): Secondary | ICD-10-CM | POA: Diagnosis not present

## 2019-02-03 DIAGNOSIS — R278 Other lack of coordination: Secondary | ICD-10-CM | POA: Diagnosis not present

## 2019-02-03 DIAGNOSIS — Z741 Need for assistance with personal care: Secondary | ICD-10-CM | POA: Diagnosis not present

## 2019-02-03 DIAGNOSIS — R2681 Unsteadiness on feet: Secondary | ICD-10-CM | POA: Diagnosis not present

## 2019-02-03 DIAGNOSIS — R2689 Other abnormalities of gait and mobility: Secondary | ICD-10-CM | POA: Diagnosis not present

## 2019-02-04 DIAGNOSIS — Z741 Need for assistance with personal care: Secondary | ICD-10-CM | POA: Diagnosis not present

## 2019-02-04 DIAGNOSIS — R2689 Other abnormalities of gait and mobility: Secondary | ICD-10-CM | POA: Diagnosis not present

## 2019-02-04 DIAGNOSIS — M6281 Muscle weakness (generalized): Secondary | ICD-10-CM | POA: Diagnosis not present

## 2019-02-04 DIAGNOSIS — R2681 Unsteadiness on feet: Secondary | ICD-10-CM | POA: Diagnosis not present

## 2019-02-04 DIAGNOSIS — S7292XS Unspecified fracture of left femur, sequela: Secondary | ICD-10-CM | POA: Diagnosis not present

## 2019-02-04 DIAGNOSIS — R278 Other lack of coordination: Secondary | ICD-10-CM | POA: Diagnosis not present

## 2019-02-05 DIAGNOSIS — M6281 Muscle weakness (generalized): Secondary | ICD-10-CM | POA: Diagnosis not present

## 2019-02-05 DIAGNOSIS — R2689 Other abnormalities of gait and mobility: Secondary | ICD-10-CM | POA: Diagnosis not present

## 2019-02-05 DIAGNOSIS — R278 Other lack of coordination: Secondary | ICD-10-CM | POA: Diagnosis not present

## 2019-02-05 DIAGNOSIS — R2681 Unsteadiness on feet: Secondary | ICD-10-CM | POA: Diagnosis not present

## 2019-02-05 DIAGNOSIS — S7292XS Unspecified fracture of left femur, sequela: Secondary | ICD-10-CM | POA: Diagnosis not present

## 2019-02-05 DIAGNOSIS — Z741 Need for assistance with personal care: Secondary | ICD-10-CM | POA: Diagnosis not present

## 2019-02-06 DIAGNOSIS — M6281 Muscle weakness (generalized): Secondary | ICD-10-CM | POA: Diagnosis not present

## 2019-02-06 DIAGNOSIS — S7292XS Unspecified fracture of left femur, sequela: Secondary | ICD-10-CM | POA: Diagnosis not present

## 2019-02-06 DIAGNOSIS — R2681 Unsteadiness on feet: Secondary | ICD-10-CM | POA: Diagnosis not present

## 2019-02-06 DIAGNOSIS — Z741 Need for assistance with personal care: Secondary | ICD-10-CM | POA: Diagnosis not present

## 2019-02-06 DIAGNOSIS — R278 Other lack of coordination: Secondary | ICD-10-CM | POA: Diagnosis not present

## 2019-02-06 DIAGNOSIS — R2689 Other abnormalities of gait and mobility: Secondary | ICD-10-CM | POA: Diagnosis not present

## 2019-02-07 DIAGNOSIS — R278 Other lack of coordination: Secondary | ICD-10-CM | POA: Diagnosis not present

## 2019-02-07 DIAGNOSIS — S7292XS Unspecified fracture of left femur, sequela: Secondary | ICD-10-CM | POA: Diagnosis not present

## 2019-02-07 DIAGNOSIS — R2681 Unsteadiness on feet: Secondary | ICD-10-CM | POA: Diagnosis not present

## 2019-02-07 DIAGNOSIS — R2689 Other abnormalities of gait and mobility: Secondary | ICD-10-CM | POA: Diagnosis not present

## 2019-02-07 DIAGNOSIS — Z741 Need for assistance with personal care: Secondary | ICD-10-CM | POA: Diagnosis not present

## 2019-02-07 DIAGNOSIS — M6281 Muscle weakness (generalized): Secondary | ICD-10-CM | POA: Diagnosis not present

## 2019-02-10 DIAGNOSIS — R278 Other lack of coordination: Secondary | ICD-10-CM | POA: Diagnosis not present

## 2019-02-10 DIAGNOSIS — Z741 Need for assistance with personal care: Secondary | ICD-10-CM | POA: Diagnosis not present

## 2019-02-10 DIAGNOSIS — R2689 Other abnormalities of gait and mobility: Secondary | ICD-10-CM | POA: Diagnosis not present

## 2019-02-10 DIAGNOSIS — M6281 Muscle weakness (generalized): Secondary | ICD-10-CM | POA: Diagnosis not present

## 2019-02-10 DIAGNOSIS — S7292XS Unspecified fracture of left femur, sequela: Secondary | ICD-10-CM | POA: Diagnosis not present

## 2019-02-10 DIAGNOSIS — R2681 Unsteadiness on feet: Secondary | ICD-10-CM | POA: Diagnosis not present

## 2019-02-11 DIAGNOSIS — S7292XS Unspecified fracture of left femur, sequela: Secondary | ICD-10-CM | POA: Diagnosis not present

## 2019-02-11 DIAGNOSIS — R2681 Unsteadiness on feet: Secondary | ICD-10-CM | POA: Diagnosis not present

## 2019-02-11 DIAGNOSIS — R278 Other lack of coordination: Secondary | ICD-10-CM | POA: Diagnosis not present

## 2019-02-11 DIAGNOSIS — M6281 Muscle weakness (generalized): Secondary | ICD-10-CM | POA: Diagnosis not present

## 2019-02-11 DIAGNOSIS — R2689 Other abnormalities of gait and mobility: Secondary | ICD-10-CM | POA: Diagnosis not present

## 2019-02-11 DIAGNOSIS — Z741 Need for assistance with personal care: Secondary | ICD-10-CM | POA: Diagnosis not present

## 2019-02-12 DIAGNOSIS — R2681 Unsteadiness on feet: Secondary | ICD-10-CM | POA: Diagnosis not present

## 2019-02-12 DIAGNOSIS — Z741 Need for assistance with personal care: Secondary | ICD-10-CM | POA: Diagnosis not present

## 2019-02-12 DIAGNOSIS — R2689 Other abnormalities of gait and mobility: Secondary | ICD-10-CM | POA: Diagnosis not present

## 2019-02-12 DIAGNOSIS — S7292XS Unspecified fracture of left femur, sequela: Secondary | ICD-10-CM | POA: Diagnosis not present

## 2019-02-12 DIAGNOSIS — M6281 Muscle weakness (generalized): Secondary | ICD-10-CM | POA: Diagnosis not present

## 2019-02-12 DIAGNOSIS — R278 Other lack of coordination: Secondary | ICD-10-CM | POA: Diagnosis not present

## 2019-02-13 DIAGNOSIS — S7292XA Unspecified fracture of left femur, initial encounter for closed fracture: Secondary | ICD-10-CM | POA: Diagnosis not present

## 2019-02-13 DIAGNOSIS — X58XXXA Exposure to other specified factors, initial encounter: Secondary | ICD-10-CM | POA: Diagnosis not present

## 2019-02-13 DIAGNOSIS — Z96652 Presence of left artificial knee joint: Secondary | ICD-10-CM | POA: Diagnosis not present

## 2019-02-13 DIAGNOSIS — T148XXA Other injury of unspecified body region, initial encounter: Secondary | ICD-10-CM | POA: Diagnosis not present

## 2019-02-13 DIAGNOSIS — M9702XA Periprosthetic fracture around internal prosthetic left hip joint, initial encounter: Secondary | ICD-10-CM | POA: Diagnosis not present

## 2019-02-14 DIAGNOSIS — S7292XS Unspecified fracture of left femur, sequela: Secondary | ICD-10-CM | POA: Diagnosis not present

## 2019-02-14 DIAGNOSIS — R278 Other lack of coordination: Secondary | ICD-10-CM | POA: Diagnosis not present

## 2019-02-14 DIAGNOSIS — Z741 Need for assistance with personal care: Secondary | ICD-10-CM | POA: Diagnosis not present

## 2019-02-14 DIAGNOSIS — R2689 Other abnormalities of gait and mobility: Secondary | ICD-10-CM | POA: Diagnosis not present

## 2019-02-14 DIAGNOSIS — M6281 Muscle weakness (generalized): Secondary | ICD-10-CM | POA: Diagnosis not present

## 2019-02-14 DIAGNOSIS — R2681 Unsteadiness on feet: Secondary | ICD-10-CM | POA: Diagnosis not present

## 2019-02-16 ENCOUNTER — Other Ambulatory Visit: Payer: Self-pay | Admitting: Internal Medicine

## 2019-02-17 DIAGNOSIS — R2689 Other abnormalities of gait and mobility: Secondary | ICD-10-CM | POA: Diagnosis not present

## 2019-02-17 DIAGNOSIS — R278 Other lack of coordination: Secondary | ICD-10-CM | POA: Diagnosis not present

## 2019-02-17 DIAGNOSIS — R2681 Unsteadiness on feet: Secondary | ICD-10-CM | POA: Diagnosis not present

## 2019-02-17 DIAGNOSIS — Z741 Need for assistance with personal care: Secondary | ICD-10-CM | POA: Diagnosis not present

## 2019-02-17 DIAGNOSIS — S7292XS Unspecified fracture of left femur, sequela: Secondary | ICD-10-CM | POA: Diagnosis not present

## 2019-02-17 DIAGNOSIS — M6281 Muscle weakness (generalized): Secondary | ICD-10-CM | POA: Diagnosis not present

## 2019-02-18 ENCOUNTER — Telehealth: Payer: Self-pay

## 2019-02-18 DIAGNOSIS — M6281 Muscle weakness (generalized): Secondary | ICD-10-CM | POA: Diagnosis not present

## 2019-02-18 DIAGNOSIS — R2681 Unsteadiness on feet: Secondary | ICD-10-CM | POA: Diagnosis not present

## 2019-02-18 DIAGNOSIS — S7292XS Unspecified fracture of left femur, sequela: Secondary | ICD-10-CM | POA: Diagnosis not present

## 2019-02-18 DIAGNOSIS — R278 Other lack of coordination: Secondary | ICD-10-CM | POA: Diagnosis not present

## 2019-02-18 DIAGNOSIS — Z741 Need for assistance with personal care: Secondary | ICD-10-CM | POA: Diagnosis not present

## 2019-02-18 DIAGNOSIS — R2689 Other abnormalities of gait and mobility: Secondary | ICD-10-CM | POA: Diagnosis not present

## 2019-02-18 NOTE — Telephone Encounter (Signed)
Copied from [place] [id number]. Topic: General - Other >> Feb 18, 2019  8:24 AM Virl Axe D wrote: Reason for CRM: Pt stated due to changes in her BP medication she was to report her B/Preadings for the last 2 weeks. Please advise. 02/04/19 111/61 02/05/19 124/62 02/06/19 144/81 02/07/19 124/66 02/08/19 132/72 02/09/19 134/79 02/10/19 170/69 02/11/19 147/69 02/13/19 144/73 02/14/19 125/48 02/15/19 118/79 02/16/19 116/74

## 2019-02-18 NOTE — Telephone Encounter (Signed)
The majority are at goal for age,  contineu current meds

## 2019-02-19 DIAGNOSIS — M6281 Muscle weakness (generalized): Secondary | ICD-10-CM | POA: Diagnosis not present

## 2019-02-19 DIAGNOSIS — Z741 Need for assistance with personal care: Secondary | ICD-10-CM | POA: Diagnosis not present

## 2019-02-19 DIAGNOSIS — R278 Other lack of coordination: Secondary | ICD-10-CM | POA: Diagnosis not present

## 2019-02-19 DIAGNOSIS — R2689 Other abnormalities of gait and mobility: Secondary | ICD-10-CM | POA: Diagnosis not present

## 2019-02-19 DIAGNOSIS — S7292XS Unspecified fracture of left femur, sequela: Secondary | ICD-10-CM | POA: Diagnosis not present

## 2019-02-19 DIAGNOSIS — R2681 Unsteadiness on feet: Secondary | ICD-10-CM | POA: Diagnosis not present

## 2019-02-19 NOTE — Telephone Encounter (Signed)
Message from Dr. Derrel Nip read to patient; verbalizes understanding.

## 2019-02-19 NOTE — Telephone Encounter (Signed)
LMTCB. PEC may speak with pt.  

## 2019-02-20 DIAGNOSIS — R278 Other lack of coordination: Secondary | ICD-10-CM | POA: Diagnosis not present

## 2019-02-20 DIAGNOSIS — R2689 Other abnormalities of gait and mobility: Secondary | ICD-10-CM | POA: Diagnosis not present

## 2019-02-20 DIAGNOSIS — R2681 Unsteadiness on feet: Secondary | ICD-10-CM | POA: Diagnosis not present

## 2019-02-20 DIAGNOSIS — S7292XS Unspecified fracture of left femur, sequela: Secondary | ICD-10-CM | POA: Diagnosis not present

## 2019-02-20 DIAGNOSIS — M6281 Muscle weakness (generalized): Secondary | ICD-10-CM | POA: Diagnosis not present

## 2019-02-20 DIAGNOSIS — Z741 Need for assistance with personal care: Secondary | ICD-10-CM | POA: Diagnosis not present

## 2019-02-24 DIAGNOSIS — Z741 Need for assistance with personal care: Secondary | ICD-10-CM | POA: Diagnosis not present

## 2019-02-24 DIAGNOSIS — R2681 Unsteadiness on feet: Secondary | ICD-10-CM | POA: Diagnosis not present

## 2019-02-24 DIAGNOSIS — M6281 Muscle weakness (generalized): Secondary | ICD-10-CM | POA: Diagnosis not present

## 2019-02-24 DIAGNOSIS — R2689 Other abnormalities of gait and mobility: Secondary | ICD-10-CM | POA: Diagnosis not present

## 2019-02-24 DIAGNOSIS — S7292XS Unspecified fracture of left femur, sequela: Secondary | ICD-10-CM | POA: Diagnosis not present

## 2019-02-24 DIAGNOSIS — R278 Other lack of coordination: Secondary | ICD-10-CM | POA: Diagnosis not present

## 2019-02-26 DIAGNOSIS — M6281 Muscle weakness (generalized): Secondary | ICD-10-CM | POA: Diagnosis not present

## 2019-02-26 DIAGNOSIS — S7292XS Unspecified fracture of left femur, sequela: Secondary | ICD-10-CM | POA: Diagnosis not present

## 2019-02-26 DIAGNOSIS — R278 Other lack of coordination: Secondary | ICD-10-CM | POA: Diagnosis not present

## 2019-02-26 DIAGNOSIS — Z741 Need for assistance with personal care: Secondary | ICD-10-CM | POA: Diagnosis not present

## 2019-02-26 DIAGNOSIS — R2689 Other abnormalities of gait and mobility: Secondary | ICD-10-CM | POA: Diagnosis not present

## 2019-02-26 DIAGNOSIS — R2681 Unsteadiness on feet: Secondary | ICD-10-CM | POA: Diagnosis not present

## 2019-02-27 DIAGNOSIS — R2681 Unsteadiness on feet: Secondary | ICD-10-CM | POA: Diagnosis not present

## 2019-02-27 DIAGNOSIS — R2689 Other abnormalities of gait and mobility: Secondary | ICD-10-CM | POA: Diagnosis not present

## 2019-02-27 DIAGNOSIS — M6281 Muscle weakness (generalized): Secondary | ICD-10-CM | POA: Diagnosis not present

## 2019-02-27 DIAGNOSIS — Z741 Need for assistance with personal care: Secondary | ICD-10-CM | POA: Diagnosis not present

## 2019-02-27 DIAGNOSIS — S7292XS Unspecified fracture of left femur, sequela: Secondary | ICD-10-CM | POA: Diagnosis not present

## 2019-02-27 DIAGNOSIS — R278 Other lack of coordination: Secondary | ICD-10-CM | POA: Diagnosis not present

## 2019-03-18 ENCOUNTER — Telehealth: Payer: Self-pay | Admitting: Internal Medicine

## 2019-03-18 MED ORDER — TELMISARTAN 20 MG PO TABS: 20.0000 mg | ORAL_TABLET | Freq: Every day | ORAL | 1 refills | Status: DC

## 2019-03-18 NOTE — Telephone Encounter (Signed)
Copied from [place] [id number]. Topic: Quick Communication - Rx Refill/Question >> Mar 18, 2019 11:42 AM Virl Axe D wrote: Medication: telmisartan (MICARDIS) 20 MG tablet /Pt would like to have refill sent to [facility]  Has the patient contacted their pharmacy? Yes.   (Agent: If no, request that the patient contact the pharmacy for the refill.) (Agent: If yes, when and what did the pharmacy advise?)  Preferred Pharmacy (with phone number or street name): [place], [place] [phone] (Phone) [phone] (Fax)    Agent: Please be advised that RX refills may take up to 3 business days. We ask that you follow-up with your pharmacy.

## 2019-03-18 NOTE — Telephone Encounter (Signed)
Medication has been refilled and sent to [facility].

## 2019-03-19 ENCOUNTER — Other Ambulatory Visit: Payer: Self-pay | Admitting: Internal Medicine

## 2019-05-07 DIAGNOSIS — Z961 Presence of intraocular lens: Secondary | ICD-10-CM | POA: Diagnosis not present

## 2019-05-07 DIAGNOSIS — H353130 Nonexudative age-related macular degeneration, bilateral, stage unspecified: Secondary | ICD-10-CM | POA: Diagnosis not present

## 2019-05-07 DIAGNOSIS — H43813 Vitreous degeneration, bilateral: Secondary | ICD-10-CM | POA: Diagnosis not present

## 2019-05-07 DIAGNOSIS — H26493 Other secondary cataract, bilateral: Secondary | ICD-10-CM | POA: Diagnosis not present

## 2019-05-08 ENCOUNTER — Other Ambulatory Visit: Payer: Self-pay

## 2019-05-08 ENCOUNTER — Ambulatory Visit (INDEPENDENT_AMBULATORY_CARE_PROVIDER_SITE_OTHER): Payer: Medicare HMO | Admitting: Family Medicine

## 2019-05-08 DIAGNOSIS — Z9989 Dependence on other enabling machines and devices: Secondary | ICD-10-CM

## 2019-05-08 DIAGNOSIS — M79601 Pain in right arm: Secondary | ICD-10-CM | POA: Diagnosis not present

## 2019-05-08 DIAGNOSIS — M79602 Pain in left arm: Secondary | ICD-10-CM | POA: Diagnosis not present

## 2019-05-09 ENCOUNTER — Encounter: Payer: Self-pay | Admitting: Family Medicine

## 2019-05-09 DIAGNOSIS — Z9989 Dependence on other enabling machines and devices: Secondary | ICD-10-CM | POA: Insufficient documentation

## 2019-05-09 NOTE — Progress Notes (Signed)
Patient ID: Pamela Paul, female   DOB: 16-Oct-1926, 85 y.o.   MRN: [id number]    Virtual Visit via phone Note  This visit type was conducted due to national recommendations for restrictions regarding the COVID-19 pandemic (e.g. social distancing).  This format is felt to be most appropriate for this patient at this time.  All issues noted in this document were discussed and addressed.  No physical exam was performed (except for noted visual exam findings with Video Visits).   I connected with Pamela Paul today at  9:00 AM EDT by telephone and verified that I am speaking with the correct person using two identifiers. Location patient: home Location provider: [place] Persons participating in the virtual visit: patient, provider  I discussed the limitations, risks, security and privacy concerns of performing an evaluation and management service by telephone and the availability of in person appointments. I also discussed with the patient that there may be a patient responsible charge related to this service. The patient expressed understanding and agreed to proceed.  HPI:  Patient and I connected via telephone due to complaint of pain and weakness in both upper extremities.  Patient first described as a numbness, but states it is not quite the right word she just feels weak and aching in the arms especially after using her walker.  Patient does rely on her walker to get around and believes she may be leaning too hard on toilet or gripping too hard.  Patient has been trying to strengthen her arms by doing exercises, she did get 3 pound weights, but they are too heavy for her to do multiple repetitions.  Denies any issues gripping a cup or mug to take a drink.  States she can hold a pen and write without problems.  Does not have problems holding the phone up to her ear.  Denies fever or chills.  Denies chest pain, shortness of breath or wheezing.  Denies jaw pain.  Denies feeling sweaty,  faint or dizzy.  Denies GI or GU issues.  Denies body aches.  ROS: See pertinent positives and negatives per HPI.  Past Medical History:  Diagnosis Date  . CKD (chronic kidney disease) stage 3, GFR 30-59 ml/min (HCC)   . Heart disease    mitral valvular  . Hyperlipidemia   . Hypertension   . Hypothyroidism   . Spinal stenosis of lumbar region 2006   s/p surgery    Past Surgical History:  Procedure Laterality Date  . ABDOMINAL HYSTERECTOMY    . bilateral cataract surgery    . CHOLECYSTECTOMY  1980  . JOINT REPLACEMENT     Bilateral knee  . LAPAROSCOPIC SIGMOID COLECTOMY  2011   secondary to benign mass  . REPLACEMENT TOTAL KNEE BILATERAL    . SMALL INTESTINE SURGERY  2010   for SBO  . SPINE SURGERY  2006   [facility], [facility]  . tendon repair     achille heall, left    Family History  Problem Relation Age of Onset  . Arthritis Mother   . Stroke Mother   . Hyperlipidemia Father   . Cancer Father   . Heart disease Paternal Aunt    Social History   Tobacco Use  . Smoking status: Former Smoker    Last attempt to quit: 03/15/1961    Years since quitting: 58.1  . Smokeless tobacco: Never Used  . Tobacco comment: Quit many years ago.  Substance Use Topics  . Alcohol use: Not Currently  Current Outpatient Medications:  .  albuterol (PROVENTIL HFA;VENTOLIN HFA) 108 (90 Base) MCG/ACT inhaler, , Disp: , Rfl:  .  cholecalciferol (VITAMIN D) 1000 units tablet, Take 1,000 Units by mouth daily., Disp: , Rfl:  .  enoxaparin (LOVENOX) 30 MG/0.3ML injection, , Disp: , Rfl:  .  guaiFENesin (MUCINEX) 600 MG 12 hr tablet, Take by mouth., Disp: , Rfl:  .  ipratropium-albuterol (DUONEB) 0.5-2.5 (3) MG/3ML SOLN, Inhale into the lungs., Disp: , Rfl:  .  levothyroxine (SYNTHROID, LEVOTHROID) 50 MCG tablet, TAKE 1 TABLET BY MOUTH  DAILY, Disp: 90 tablet, Rfl: 1 .  Multiple Vitamins-Minerals (PRESERVISION AREDS PO), Take 1 capsule by mouth 2 (two) times daily., Disp: , Rfl:  .  NON  FORMULARY, 1 capsule Two (2) times a day. Tumeric Herb, Disp: , Rfl:  .  polyethylene glycol powder (GLYCOLAX/MIRALAX) powder, Take by mouth., Disp: , Rfl:  .  senna (SENOKOT) 8.6 MG TABS tablet, Take by mouth., Disp: , Rfl:  .  telmisartan (MICARDIS) 20 MG tablet, Take 1 tablet (20 mg total) by mouth daily., Disp: 90 tablet, Rfl: 1  EXAM:  GENERAL: alert, oriented, sounds well and in no acute distress  LUNGS: Speaking in full sentences, no signs of respiratory distress, breathing rate sounds normal, no obvious gross SOB, gasping, coughing or wheezing  PSYCH/NEURO: pleasant and cooperative, no obvious depression or anxiety, speech and thought processing grossly intact  ASSESSMENT AND PLAN:  Discussed the following assessment and plan:  Pain in both upper extremities  Uses walker  From what patient is describing I am wondering if she is having some issues overuse in both upper extremities with gripping too hard on walker.  Suggested patient try using 1 pound weights instead of a 3 pound weight to help build up strength in her arms and then slowly increase the weight she lifts and does arm exercises with to build up her strength.  Also discussed exercises such as squeezing a ball to strengthen hands, doing arm raises raising arms up above head, doing arm circles with holding arm straight up to size and doing small circular motions.  Also advised patient that if needed she can take Tylenol for pain.  Advised that I would recommend no more than 2000 mg of Tylenol total in one 24 hour period.    I discussed the assessment and treatment plan with the patient. The patient was provided an opportunity to ask questions and all were answered. The patient agreed with the plan and demonstrated an understanding of the instructions.   The patient was advised to call back or seek an in-person evaluation if the symptoms worsen or if the condition fails to improve as anticipated.  I provided 15 minutes of  non-face-to-face time during this encounter.   Jodelle Green, FNP

## 2019-06-14 ENCOUNTER — Ambulatory Visit (INDEPENDENT_AMBULATORY_CARE_PROVIDER_SITE_OTHER): Payer: Medicare HMO | Admitting: Internal Medicine

## 2019-06-14 ENCOUNTER — Other Ambulatory Visit: Payer: Self-pay

## 2019-06-14 ENCOUNTER — Encounter: Payer: Self-pay | Admitting: Internal Medicine

## 2019-06-14 DIAGNOSIS — R3989 Other symptoms and signs involving the genitourinary system: Secondary | ICD-10-CM

## 2019-06-14 DIAGNOSIS — R3915 Urgency of urination: Secondary | ICD-10-CM | POA: Diagnosis not present

## 2019-06-14 MED ORDER — SULFAMETHOXAZOLE-TRIMETHOPRIM 800-160 MG PO TABS: 1.0000 | ORAL_TABLET | Freq: Two times a day (BID) | ORAL | 0 refills | Status: DC

## 2019-06-14 NOTE — Progress Notes (Signed)
Virtual Visit via Video Note  I connected with@ on 06/14/19 at 11:20 AM EDT by a video enabled telemedicine application and verified that I am speaking with the correct person using two identifiers. Location patient: home Location provider:work office Persons participating in the virtual visit: patient, provider  [place]th national recommendations  regarding COVID 19 pandemic   video visit is advised over in office visit for this patient.  Patient aware  of the limitations of evaluation and management by telemedicine and  availability of in person appointments. and agreed to proceed.   HPI: Pamela Paul presents for video visit  She has had 2-3 days or urgency   Frequency without volume and discomfort  With urination without   Fever back pain hematuria  Last uti i " a while back"   No other sx abd pain fever chills . Is in independent living [place] has friends who help her get her med's etc . Otherwise is well.   ROS: See pertinent positives and negatives per HPI. Feels well otherwise   Past Medical History:  Diagnosis Date  . CKD (chronic kidney disease) stage 3, GFR 30-59 ml/min (HCC)   . Heart disease    mitral valvular  . Hyperlipidemia   . Hypertension   . Hypothyroidism   . Spinal stenosis of lumbar region 2006   s/p surgery    Past Surgical History:  Procedure Laterality Date  . ABDOMINAL HYSTERECTOMY    . bilateral cataract surgery    . CHOLECYSTECTOMY  1980  . JOINT REPLACEMENT     Bilateral knee  . LAPAROSCOPIC SIGMOID COLECTOMY  2011   secondary to benign mass  . REPLACEMENT TOTAL KNEE BILATERAL    . SMALL INTESTINE SURGERY  2010   for SBO  . SPINE SURGERY  2006   [facility], [facility]  . tendon repair     achille heall, left     Family History  Problem Relation Age of Onset  . Arthritis Mother   . Stroke Mother   . Hyperlipidemia Father   . Cancer Father   . Heart disease Paternal Aunt     Social History   Tobacco Use  . Smoking status: Former Smoker     Quit date: 03/15/1961    Years since quitting: 58.2  . Smokeless tobacco: Never Used  . Tobacco comment: Quit many years ago.  Substance Use Topics  . Alcohol use: Not Currently  . Drug use: No      Current Outpatient Medications:  .  albuterol (PROVENTIL HFA;VENTOLIN HFA) 108 (90 Base) MCG/ACT inhaler, , Disp: , Rfl:  .  cholecalciferol (VITAMIN D) 1000 units tablet, Take 1,000 Units by mouth daily., Disp: , Rfl:  .  enoxaparin (LOVENOX) 30 MG/0.3ML injection, , Disp: , Rfl:  .  guaiFENesin (MUCINEX) 600 MG 12 hr tablet, Take by mouth., Disp: , Rfl:  .  ipratropium-albuterol (DUONEB) 0.5-2.5 (3) MG/3ML SOLN, Inhale into the lungs., Disp: , Rfl:  .  levothyroxine (SYNTHROID, LEVOTHROID) 50 MCG tablet, TAKE 1 TABLET BY MOUTH  DAILY, Disp: 90 tablet, Rfl: 1 .  Multiple Vitamins-Minerals (PRESERVISION AREDS PO), Take 1 capsule by mouth 2 (two) times daily., Disp: , Rfl:  .  NON FORMULARY, 1 capsule Two (2) times a day. Tumeric Herb, Disp: , Rfl:  .  polyethylene glycol powder (GLYCOLAX/MIRALAX) powder, Take by mouth., Disp: , Rfl:  .  senna (SENOKOT) 8.6 MG TABS tablet, Take by mouth., Disp: , Rfl:  .  sulfamethoxazole-trimethoprim (BACTRIM DS) 800-160  MG tablet, Take 1 tablet by mouth 2 (two) times daily., Disp: 6 tablet, Rfl: 0 .  telmisartan (MICARDIS) 20 MG tablet, Take 1 tablet (20 mg total) by mouth daily., Disp: 90 tablet, Rfl: 1  EXAM: BP Readings from Last 3 Encounters:  01/31/19 132/64  01/31/19 132/64  07/31/18 128/70    VITALS per patient if applicable:   GENERAL: alert, oriented, appears well and in no acute distress appears youngetr than stated age  HEENT: atraumatic, conjunttiva clear, no obvious abnormalities on inspection of external nose and ears NECK: normal movements of the head and neck LUNGS: on inspection no signs of respiratory distress, breathing rate appears normal, no obvious gross SOB, gasping or wheezing CV: no obvious cyanosis PSYCH/NEURO: pleasant  and cooperative, no obvious depression or anxiety, speech and thought processing grossly intact Lab Results  Component Value Date   WBC 5.3 01/16/2019   HGB 9.5 (A) 01/16/2019   HCT 341 (A) 01/16/2019   PLT 296.0 07/31/2018   GLUCOSE 107 (H) 07/31/2018   CHOL 206 (H) 07/31/2018   TRIG 172.0 (H) 07/31/2018   HDL 47.90 07/31/2018   LDLDIRECT 132.0 07/12/2017   LDLCALC 124 (H) 07/31/2018   ALT 16 01/12/2019   AST 32 01/12/2019   NA 132 (A) 01/12/2019   K 4.7 07/31/2018   CL 104 07/31/2018   CREATININE 1.2 (A) 01/12/2019   BUN 13 01/12/2019   CO2 24 07/31/2018   TSH 2.47 07/31/2018    ASSESSMENT AND PLAN:  Discussed the following assessment and plan:    ICD-10-CM   1. Suspected UTI  R39.89   2. Urinary urgency  R39.15   suspected uti    Risk benefit of medication discussed.   reasonable to treat without  Getting ua ucx  today ( Saturday  Lab not open ) .  rx x 3 days and plan fu PCP with ua and cultures if ongoing  Counseled.  No antibiotic allergy   Expectant management and discussion of plan and treatment with opportunity to ask questions and all were answered. The patient agreed with the plan and demonstrated an understanding of the instructions.   Advised to call back or seek an in-person evaluation if worsening  or having  further concerns .  Shanon Ace, MD

## 2019-07-19 ENCOUNTER — Other Ambulatory Visit: Payer: Self-pay | Admitting: Internal Medicine

## 2019-07-28 DIAGNOSIS — E871 Hypo-osmolality and hyponatremia: Secondary | ICD-10-CM | POA: Diagnosis not present

## 2019-07-28 DIAGNOSIS — N183 Chronic kidney disease, stage 3 (moderate): Secondary | ICD-10-CM | POA: Diagnosis not present

## 2019-07-28 DIAGNOSIS — I129 Hypertensive chronic kidney disease with stage 1 through stage 4 chronic kidney disease, or unspecified chronic kidney disease: Secondary | ICD-10-CM | POA: Diagnosis not present

## 2019-08-06 ENCOUNTER — Ambulatory Visit: Payer: Medicare HMO | Admitting: Internal Medicine

## 2019-08-18 DIAGNOSIS — N2889 Other specified disorders of kidney and ureter: Secondary | ICD-10-CM | POA: Diagnosis not present

## 2019-08-18 DIAGNOSIS — I10 Essential (primary) hypertension: Secondary | ICD-10-CM | POA: Diagnosis not present

## 2019-08-18 DIAGNOSIS — N2581 Secondary hyperparathyroidism of renal origin: Secondary | ICD-10-CM | POA: Diagnosis not present

## 2019-08-18 DIAGNOSIS — I129 Hypertensive chronic kidney disease with stage 1 through stage 4 chronic kidney disease, or unspecified chronic kidney disease: Secondary | ICD-10-CM | POA: Diagnosis not present

## 2019-08-18 DIAGNOSIS — N183 Chronic kidney disease, stage 3 (moderate): Secondary | ICD-10-CM | POA: Diagnosis not present

## 2019-08-22 ENCOUNTER — Telehealth: Payer: Self-pay | Admitting: Internal Medicine

## 2019-08-22 NOTE — Telephone Encounter (Signed)
Patient's son, langenberg, Shanon Brow is calling to see if Dr. Derrel Nip has results from Dr. Juleen China for kidney and sodium results. Please advise [phone] son has access to MyChart.

## 2019-08-26 NOTE — Telephone Encounter (Signed)
Do you recall getting these while I was out of the office? Son is not on pt's DPR.

## 2019-08-26 NOTE — Telephone Encounter (Signed)
LMTCB

## 2019-08-26 NOTE — Telephone Encounter (Signed)
I may have ,  But I cannot recall because  They do not get automatically uploaded to Epic or mychart.  I have to initial them and then send them to be abstracted,  So he should contact [place] office directly  If he wants them soon .

## 2019-09-01 NOTE — Telephone Encounter (Signed)
Attempted to call pt. Number busy.

## 2019-09-04 DIAGNOSIS — Z85828 Personal history of other malignant neoplasm of skin: Secondary | ICD-10-CM | POA: Diagnosis not present

## 2019-09-04 DIAGNOSIS — L821 Other seborrheic keratosis: Secondary | ICD-10-CM | POA: Diagnosis not present

## 2019-09-04 DIAGNOSIS — Z08 Encounter for follow-up examination after completed treatment for malignant neoplasm: Secondary | ICD-10-CM | POA: Diagnosis not present

## 2019-09-04 DIAGNOSIS — X32XXXA Exposure to sunlight, initial encounter: Secondary | ICD-10-CM | POA: Diagnosis not present

## 2019-09-04 DIAGNOSIS — L570 Actinic keratosis: Secondary | ICD-10-CM | POA: Diagnosis not present

## 2019-09-21 ENCOUNTER — Other Ambulatory Visit: Payer: Self-pay | Admitting: Internal Medicine

## 2019-10-10 ENCOUNTER — Ambulatory Visit
Admission: RE | Admit: 2019-10-10 | Discharge: 2019-10-10 | Disposition: A | Discharge status: Home / Self Care | Payer: Medicare HMO | Source: Ambulatory Visit | Attending: Nephrology | Admitting: Nephrology

## 2019-10-10 ENCOUNTER — Other Ambulatory Visit: Payer: Self-pay

## 2019-10-10 ENCOUNTER — Other Ambulatory Visit: Payer: Self-pay | Admitting: Nephrology

## 2019-10-10 DIAGNOSIS — N186 End stage renal disease: Secondary | ICD-10-CM | POA: Diagnosis not present

## 2019-10-10 DIAGNOSIS — N2581 Secondary hyperparathyroidism of renal origin: Secondary | ICD-10-CM | POA: Insufficient documentation

## 2019-10-10 DIAGNOSIS — N10 Acute tubulo-interstitial nephritis: Secondary | ICD-10-CM | POA: Diagnosis not present

## 2019-10-10 DIAGNOSIS — N2889 Other specified disorders of kidney and ureter: Secondary | ICD-10-CM | POA: Diagnosis not present

## 2019-10-10 DIAGNOSIS — N1832 Chronic kidney disease, stage 3b: Secondary | ICD-10-CM

## 2019-10-10 DIAGNOSIS — I129 Hypertensive chronic kidney disease with stage 1 through stage 4 chronic kidney disease, or unspecified chronic kidney disease: Secondary | ICD-10-CM | POA: Diagnosis not present

## 2019-10-14 ENCOUNTER — Other Ambulatory Visit: Payer: Self-pay | Admitting: Nephrology

## 2019-10-14 DIAGNOSIS — N2889 Other specified disorders of kidney and ureter: Secondary | ICD-10-CM

## 2019-10-24 ENCOUNTER — Ambulatory Visit
Admission: RE | Admit: 2019-10-24 | Discharge: 2019-10-24 | Disposition: A | Discharge status: Home / Self Care | Payer: Medicare HMO | Source: Ambulatory Visit | Attending: Nephrology | Admitting: Nephrology

## 2019-10-24 ENCOUNTER — Other Ambulatory Visit: Payer: Self-pay

## 2019-10-24 DIAGNOSIS — N2889 Other specified disorders of kidney and ureter: Secondary | ICD-10-CM | POA: Insufficient documentation

## 2019-10-24 DIAGNOSIS — N281 Cyst of kidney, acquired: Secondary | ICD-10-CM | POA: Diagnosis not present

## 2019-10-24 DIAGNOSIS — K449 Diaphragmatic hernia without obstruction or gangrene: Secondary | ICD-10-CM | POA: Diagnosis not present

## 2019-12-12 ENCOUNTER — Other Ambulatory Visit: Payer: Self-pay | Admitting: Internal Medicine

## 2019-12-19 ENCOUNTER — Telehealth: Payer: Self-pay | Admitting: Internal Medicine

## 2019-12-19 NOTE — Telephone Encounter (Signed)
would you please update her immunizations?

## 2019-12-19 NOTE — Telephone Encounter (Signed)
Pt wanted to let Dr. Derrel Nip know that she is scheduled to get her first Covid vaccine on 12/22/19

## 2019-12-19 NOTE — Telephone Encounter (Signed)
FYI

## 2019-12-22 NOTE — Telephone Encounter (Signed)
No answer no voicemail. Need to find out if she got the pfizer or moderna vaccine.

## 2019-12-30 NOTE — Telephone Encounter (Signed)
Spoke with pt and she stated that she received the Moderna on 12/22/2019. Chart has been updated.

## 2020-02-02 ENCOUNTER — Other Ambulatory Visit: Payer: Self-pay

## 2020-02-02 ENCOUNTER — Ambulatory Visit (INDEPENDENT_AMBULATORY_CARE_PROVIDER_SITE_OTHER): Payer: Medicare HMO

## 2020-02-02 ENCOUNTER — Ambulatory Visit: Payer: Medicare HMO | Admitting: Internal Medicine

## 2020-02-02 ENCOUNTER — Encounter: Payer: Self-pay | Admitting: Internal Medicine

## 2020-02-02 ENCOUNTER — Telehealth (INDEPENDENT_AMBULATORY_CARE_PROVIDER_SITE_OTHER): Payer: Medicare HMO | Admitting: Internal Medicine

## 2020-02-02 VITALS — Ht 66.0 in | Wt 177.0 lb

## 2020-02-02 DIAGNOSIS — K862 Cyst of pancreas: Secondary | ICD-10-CM | POA: Insufficient documentation

## 2020-02-02 DIAGNOSIS — N2889 Other specified disorders of kidney and ureter: Secondary | ICD-10-CM | POA: Diagnosis not present

## 2020-02-02 DIAGNOSIS — Z0000 Encounter for general adult medical examination without abnormal findings: Secondary | ICD-10-CM | POA: Diagnosis not present

## 2020-02-02 DIAGNOSIS — N1831 Chronic kidney disease, stage 3a: Secondary | ICD-10-CM

## 2020-02-02 DIAGNOSIS — E034 Atrophy of thyroid (acquired): Secondary | ICD-10-CM | POA: Diagnosis not present

## 2020-02-02 DIAGNOSIS — H3530 Unspecified macular degeneration: Secondary | ICD-10-CM | POA: Diagnosis not present

## 2020-02-02 NOTE — Patient Instructions (Addendum)
  Pamela Paul , Thank you for taking time to come for your Medicare Wellness Visit. I appreciate your ongoing commitment to your health goals. Please review the following plan we discussed and let me know if I can assist you in the future.   These are the goals we discussed: Goals      Patient Stated   . Increase physical activity (pt-stated)     Resume exercise regimen of swimming daily        This is a list of the screening recommended for you and due dates:  Health Maintenance  Topic Date Due  . Tetanus Vaccine  02/15/2023  . Flu Shot  Completed  . DEXA scan (bone density measurement)  Completed  . Pneumonia vaccines  Completed

## 2020-02-02 NOTE — Progress Notes (Addendum)
Subjective:   Pamela Paul is a 86 y.o. female who presents for Medicare Annual (Subsequent) preventive examination.  Review of Systems:  No ROS.  Medicare Wellness Virtual Visit.  Visual/audio telehealth visit, UTA vital signs.   Ht/Wt provided.  See social history for additional risk factors.  Cardiac Risk Factors include: advanced age (>42men, >8 women);hypertension     Objective:     Vitals: Ht 5\' 6"  (1.676 m)   Wt 177 lb (80.3 kg)   BMI 28.57 kg/m   Body mass index is 28.57 kg/m.  Advanced Directives 02/02/2020 01/31/2019 11/02/2017 10/11/2016  Does Patient Have a Medical Advance Directive? Yes Yes Yes Yes  Type of Paramedic of [place];Living will Out of facility DNR (pink MOST or yellow form) [place];Living will  Does patient want to make changes to medical advance directive? No - Patient declined No - Patient declined No - Patient declined -  Copy of [place] in Chart? No - copy requested No - copy requested - Yes  Pre-existing out of facility DNR order (yellow form or pink MOST form) - - Yellow form placed in chart (order not valid for inpatient use) -    Tobacco Social History   Tobacco Use  Smoking Status Former Smoker   Quit date: 03/15/1961   Years since quitting: 58.9  Smokeless Tobacco Never Used  Tobacco Comment   Quit many years ago.     Counseling given: Not Answered Comment: Quit many years ago.   Clinical Intake:  Pre-visit preparation completed: Yes        Diabetes: No  How often do you need to have someone help you when you read instructions, pamphlets, or other written materials from your doctor or pharmacy?: 1 - Never  Interpreter Needed?: No     Past Medical History:  Diagnosis Date   CKD (chronic kidney disease) stage 3, GFR 30-59 ml/min    Heart disease    mitral valvular   Hyperlipidemia    Hypertension    Hypothyroidism    Spinal stenosis of lumbar region 2006   s/p surgery   Past Surgical History:  Procedure Laterality Date   ABDOMINAL HYSTERECTOMY     bilateral cataract surgery     CHOLECYSTECTOMY  1980   JOINT REPLACEMENT     Bilateral knee   LAPAROSCOPIC SIGMOID COLECTOMY  2011   secondary to benign mass   REPLACEMENT TOTAL KNEE BILATERAL     SMALL INTESTINE SURGERY  2010   for SBO   SPINE SURGERY  2006   rods, [facility]   tendon repair     achille heall, left    Family History  Problem Relation Age of Onset   Arthritis Mother    Stroke Mother    Hyperlipidemia Father    Cancer Father    Heart disease Paternal Aunt    Social History   Socioeconomic History   Marital status: Widowed    Spouse name: Not on file   Number of children: Not on file   Years of education: Not on file   Highest education level: Not on file  Occupational History   Not on file  Tobacco Use   Smoking status: Former Smoker    Quit date: 03/15/1961    Years since quitting: 58.9   Smokeless tobacco: Never Used   Tobacco comment: Quit many years ago.  Substance and Sexual Activity   Alcohol use: Not Currently  Drug use: No   Sexual activity: Never  Other Topics Concern   Not on file  Social History Narrative   [place] lives   Social Determinants of Health   Financial Resource Strain:    Difficulty of Paying Living Expenses: Not on file  Food Insecurity:    Worried About Charity fundraiser in the Last Year: Not on file   [employer] of Food in the Last Year: Not on file  Transportation Needs:    Lack of Transportation (Medical): Not on file   Lack of Transportation (Non-Medical): Not on file  Physical Activity:    Days of Exercise per Week: Not on file   Minutes of Exercise per Session: Not on file  Stress:    Feeling of Stress : Not on file  Social Connections: Unknown   Frequency of Communication with Friends and Family: More than three times a week   Frequency of Social Gatherings with Friends  and Family: Twice a week   Attends Religious Services: Not on Electrical engineer or Organizations: Yes   Attends Archivist Meetings: Not on file   Marital Status: Widowed    Outpatient Encounter Medications as of 02/02/2020  Medication Sig   Ascorbic Acid (VITAMIN C) 1000 MG tablet Take 1,000 mg by mouth daily.   cholecalciferol (VITAMIN D) 1000 units tablet Take 1,000 Units by mouth daily.   levothyroxine (SYNTHROID) 50 MCG tablet TAKE 1 TABLET BY MOUTH  DAILY   Multiple Vitamins-Minerals (PRESERVISION AREDS PO) Take 1 capsule by mouth 2 (two) times daily.   telmisartan (MICARDIS) 20 MG tablet TAKE 1 TABLET BY MOUTH  DAILY   [DISCONTINUED] albuterol (PROVENTIL HFA;VENTOLIN HFA) 108 (90 Base) MCG/ACT inhaler    [DISCONTINUED] enoxaparin (LOVENOX) 30 MG/0.3ML injection    [DISCONTINUED] guaiFENesin (MUCINEX) 600 MG 12 hr tablet Take by mouth.   [DISCONTINUED] ipratropium-albuterol (DUONEB) 0.5-2.5 (3) MG/3ML SOLN Inhale into the lungs.   [DISCONTINUED] NON FORMULARY 1 capsule Two (2) times a day. Tumeric Herb   [DISCONTINUED] polyethylene glycol powder (GLYCOLAX/MIRALAX) powder Take by mouth.   [DISCONTINUED] senna (SENOKOT) 8.6 MG TABS tablet Take by mouth.   [DISCONTINUED] sulfamethoxazole-trimethoprim (BACTRIM DS) 800-160 MG tablet Take 1 tablet by mouth 2 (two) times daily.   No facility-administered encounter medications on file as of 02/02/2020.    Activities of Daily Living In your present state of health, do you have any difficulty performing the following activities: 02/02/2020  Hearing? Y  Comment Hearing aids  Vision? Y  Comment Macular degeneration  Difficulty concentrating or making decisions? N  Walking or climbing stairs? Y  Comment Unsteady gait, walker in use.  Dressing or bathing? N  Doing errands, shopping? Y  Comment She does not Physiological scientist and eating ? N  Comment Microwave only in use. Self feeds.  Using the Toilet? N  In the  past six months, have you accidently leaked urine? N  Do you have problems with loss of bowel control? N  Managing your Medications? N  Managing your Finances? Y  Comment Son Writer or managing your Housekeeping? Y  Comment Maid assist  Some recent data might be hidden    Patient Care Team: [name], MD as PCP - General (Internal Medicine)    Assessment:   This is a routine wellness examination for [place].  Nurse connected with patient 02/02/20 at  8:30 AM EST by a telephone enabled telemedicine application and verified  that I am speaking with the correct person using two identifiers. Patient stated full name and DOB. Patient gave permission to continue with virtual visit. Patient's location was at home and Nurse's location was at [place] office.   Patient is alert and oriented x3. Patient denies difficulty focusing or concentrating. Patient likes to read and complete crossword puzzles for brain stimulation.   Health Maintenance Due: See completed HM at the end of note.   Eye: Visual acuity not assessed. Virtual visit. Followed by their ophthalmologist. Macular degeneration. Next appointment scheduled 02/24/20.   Dental: [facility]  Hearing: Hearing aids- yes  Safety:  Patient feels safe at home- yes Patient does have smoke detectors at home- yes Patient does wear sunscreen or protective clothing when in direct sunlight - yes Patient does wear seat belt when in a moving vehicle - yes Patient drives- limited to a golf cart on campus.  Adequate lighting in walkways free from debris- yes Grab bars and handrails used as appropriate- yes Ambulates with an assistive device- yes; walker Life alert on person when ambulating - yes  Social: Alcohol intake - no  Smoking history- former Smokers in home? none Illicit drug use? none  Medication: Taking as directed and without issues.  Pill box in use -yes  Self managed - yes   Covid-19: Precautions and sickness  symptoms discussed. Wears mask, social distancing, hand hygiene as appropriate. Groceries ordered online.   Activities of Daily Living Patient denies needing assistance with: feeding themselves, getting from bed to chair, getting to the toilet, bathing/showering, dressing, managing money, or preparing meals.  Maid assist with household chores.   Discussed the importance of a healthy diet, water intake and the benefits of aerobic exercise.   Physical activity- walking  Diet:  Regular Water: good intake Caffeine: 1 cup of coffee  Other Providers Patient Care Team: [name], MD as PCP - General (Internal Medicine)   Exercise Activities and Dietary recommendations Current Exercise Habits: Home exercise routine, Type of exercise: walking, Time (Minutes): 20, Frequency (Times/Week): 4, Weekly Exercise (Minutes/Week): 80, Intensity: Mild  Goals       Patient Stated    Increase physical activity (pt-stated)     Resume exercise regimen of swimming daily         Fall Risk Fall Risk  02/02/2020 01/31/2019 01/16/2018 11/02/2017 10/11/2016  Falls in the past year? 0 1 No Yes Yes  Number falls in past yr: - 0 - 1 1  Injury with Fall? - 1 - No Yes  Comment - She slipped getting out of the hot tub - Walked on uneven ground -  Risk Factor Category  - - - - High Fall Risk  Risk for fall due to : - - History of fall(s) - Impaired balance/gait  Follow up Falls evaluation completed Falls prevention discussed;Education provided - Falls prevention discussed;Education provided Falls prevention discussed;Education provided  Comment - - - Cane in use when out of the home, walker in use within the home. -   Timed Get Up and Go performed: no, virtual visit  Depression Screen PHQ 2/9 Scores 02/02/2020 01/31/2019 11/02/2017 10/11/2016  PHQ - 2 Score 0 0 0 0  PHQ- 9 Score - - 0 -     Cognitive Function MMSE - Mini Mental State Exam 10/11/2016  Orientation to time 5  Orientation to Place 5   Registration 3  Attention/ Calculation 5  Recall 3  Language- name 2 objects 2  Language- repeat 1  Language-  follow 3 step command 3  Language- read & follow direction 1  Write a sentence 1  Copy design 1  Total score 30     6CIT Screen 02/02/2020 01/31/2019 11/02/2017 10/11/2016  What Year? 0 points 0 points 0 points 0 points  What month? 0 points 0 points 0 points 0 points  What time? 0 points 0 points 0 points 0 points  Count back from 20 0 points 0 points 0 points 0 points  Months in reverse 0 points 0 points 0 points 0 points  Repeat phrase 0 points - 0 points 0 points  Total Score 0 - 0 0    Immunization History  Administered Date(s) Administered   Influenza Split 10/03/2014, 10/04/2015   Influenza, High Dose Seasonal PF 10/04/2019   Influenza-Unspecified 09/03/2013, 10/01/2016, 10/06/2017, 09/18/2018   Moderna SARS-COVID-2 Vaccination 12/22/2019, 01/19/2020   Pneumococcal Conjugate-13 10/26/2014   Pneumococcal Polysaccharide-23 04/04/1993, 09/20/2008, 01/28/2016   Tdap 02/14/2013   Zoster 12/08/2009   Zoster Recombinat (Shingrix) 02/07/2017, 06/18/2017   Screening Tests Health Maintenance  Topic Date Due   TETANUS/TDAP  02/15/2023   INFLUENZA VACCINE  Completed   DEXA SCAN  Completed   PNA vac Low Risk Adult  Completed      Plan:   Keep all routine maintenance appointments.   Follow up with your doctor today @ 1:30.  Medicare Attestation I have personally reviewed: The patient's medical and social history Their use of alcohol, tobacco or illicit drugs Their current medications and supplements The patient's functional ability including ADLs,fall risks, home safety risks, cognitive, and hearing and visual impairment Diet and physical activities Evidence for depression   I have reviewed and discussed with patient certain preventive protocols, quality metrics, and best practice recommendations.     OBrien-Blaney, Denisa L, LPN  08/12/3569    I have  reviewed the above information and agree with above.   Deborra Medina, MD

## 2020-02-02 NOTE — Assessment & Plan Note (Signed)
MRI done Nov 2020.  No further workup required

## 2020-02-02 NOTE — Assessment & Plan Note (Signed)
Incidental,  Asymptomatic  < 1 cm,.  Follow up MRI 2 years if patient willing. But given her age at that point, not likely to care.

## 2020-02-02 NOTE — Progress Notes (Signed)
Virtual Visit via [place]  This visit type was conducted due to national recommendations for restrictions regarding the COVID-19 pandemic (e.g. social distancing).  This format is felt to be most appropriate for this patient at this time.  All issues noted in this document were discussed and addressed.  No physical exam was performed (except for noted visual exam findings with Video Visits).   I connected with@ on 02/02/20 at  1:30 PM EST by a video enabled telemedicine application and verified that I am speaking with the correct person using two identifiers. Location patient: home Location provider: work or home office Persons participating in the virtual visit: patient, provider  I discussed the limitations, risks, security and privacy concerns of performing an evaluation and management service by telephone and the availability of in person appointments. I also discussed with the patient that there may be a patient responsible charge related to this service. The patient expressed understanding and agreed to proceed.  Reason for visit: follow up on hypertension , CKD. hyperlipidemia . hypothyroidism  HPI: 86 yr old femalel with CKD stage 3 secondary to hypertension,  Macular degeneration, spinal stenosis , presents for follow up  She feels generally very well .  Still independent living , but gradually losing her vision due to MD and planning ahead.  Using a walker,  Gold cart instead of driving.  [facility] at [facility],  Plans to have an aid in another year.  Looking forward to resuming daily swimming once the pool opens in April.   No falls,  Appetite good.  Mood optimistic.  Son involved in care.   ROS: See pertinent positives and negatives per HPI.  Past Medical History:  Diagnosis Date  . CKD (chronic kidney disease) stage 3, GFR 30-59 ml/min   . Heart disease    mitral valvular  . Hyperlipidemia   . Hypertension   . Hypothyroidism   . Spinal stenosis of lumbar region 2006   s/p  surgery    Past Surgical History:  Procedure Laterality Date  . ABDOMINAL HYSTERECTOMY    . bilateral cataract surgery    . CHOLECYSTECTOMY  1980  . JOINT REPLACEMENT     Bilateral knee  . LAPAROSCOPIC SIGMOID COLECTOMY  2011   secondary to benign mass  . REPLACEMENT TOTAL KNEE BILATERAL    . SMALL INTESTINE SURGERY  2010   for SBO  . SPINE SURGERY  2006   rods, [facility]  . tendon repair     achille heall, left     Family History  Problem Relation Age of Onset  . Arthritis Mother   . Stroke Mother   . Hyperlipidemia Father   . Cancer Father   . Heart disease Paternal Aunt     SOCIAL HX:  reports that she quit smoking about 58 years ago. She has never used smokeless tobacco. She reports previous alcohol use. She reports that she does not use drugs.   Current Outpatient Medications:  .  Ascorbic Acid (VITAMIN C) 1000 MG tablet, Take 1,000 mg by mouth daily., Disp: , Rfl:  .  cholecalciferol (VITAMIN D) 1000 units tablet, Take 1,000 Units by mouth daily., Disp: , Rfl:  .  levothyroxine (SYNTHROID) 50 MCG tablet, TAKE 1 TABLET BY MOUTH  DAILY, Disp: 90 tablet, Rfl: 3 .  Multiple Vitamins-Minerals (PRESERVISION AREDS PO), Take 1 capsule by mouth 2 (two) times daily., Disp: , Rfl:  .  telmisartan (MICARDIS) 20 MG tablet, TAKE 1 TABLET BY MOUTH  DAILY, Disp:  90 tablet, Rfl: 3  EXAM:  VITALS per patient if applicable:  GENERAL: alert, oriented, appears well and in no acute distress  HEENT: atraumatic, conjunttiva clear, no obvious abnormalities on inspection of external nose and ears  NECK: normal movements of the head and neck  LUNGS: on inspection no signs of respiratory distress, breathing rate appears normal, no obvious gross SOB, gasping or wheezing  CV: no obvious cyanosis  MS: moves all visible extremities without noticeable abnormality  PSYCH/NEURO: pleasant and cooperative, no obvious depression or anxiety, speech and thought processing grossly  intact  ASSESSMENT AND PLAN:  Discussed the following assessment and plan:  Stage 3a chronic kidney disease  Macular degeneration of both eyes, unspecified type  Hypothyroidism due to acquired atrophy of thyroid  Renal mass, left  Pancreatic cyst  CKD (chronic kidney disease) stage 3, GFR 30-59 ml/min Stable, followed by Dr Abigail Butts.  Renal function is at  baseline with avoidance of NSAIDs.  She is on an ARB  for control of hypertension.    Lab Results  Component Value Date   NA 132 (A) 01/12/2019   K 4.7 07/31/2018   CL 104 07/31/2018   CO2 24 07/31/2018   Lab Results  Component Value Date   CREATININE 1.2 (A) 01/12/2019     Macular degeneration Progressive loss of vision occurring ,  She is handling the inevitability by planning ahead and using technology to aid her   Hypothyroidism Asymptomatic,  Last TS was in 2019  Dr Abigail Butts to check in April   Renal mass, left MRI done Nov 2020.  No further workup required  Pancreatic cyst Incidental,  Asymptomatic  < 1 cm,.  Follow up MRI 2 years if patient willing. But given her age at that point, not likely to care.     I discussed the assessment and treatment plan with the patient. The patient was provided an opportunity to ask questions and all were answered. The patient agreed with the plan and demonstrated an understanding of the instructions.   The patient was advised to call back or seek an in-person evaluation if the symptoms worsen or if the condition fails to improve as anticipated.  I provided  30 minutes of non-face-to-face time during this encounter reviewing patient's current problems and past surgeries, labs and imaging studies, providing counseling on the above mentioned problems , and coordination  of care . [name], MD

## 2020-02-02 NOTE — Assessment & Plan Note (Signed)
Asymptomatic,  Last TS was in 2019  Dr Abigail Butts to check in April

## 2020-02-02 NOTE — Assessment & Plan Note (Signed)
Stable, followed by Dr Abigail Butts.  Renal function is at  baseline with avoidance of NSAIDs.  She is on an ARB  for control of hypertension.    Lab Results  Component Value Date   NA 132 (A) 01/12/2019   K 4.7 07/31/2018   CL 104 07/31/2018   CO2 24 07/31/2018   Lab Results  Component Value Date   CREATININE 1.2 (A) 01/12/2019

## 2020-02-02 NOTE — Assessment & Plan Note (Signed)
Progressive loss of vision occurring ,  She is handling the inevitability by planning ahead and using technology to aid her

## 2020-02-25 ENCOUNTER — Telehealth: Payer: Self-pay | Admitting: Internal Medicine

## 2020-02-25 DIAGNOSIS — E034 Atrophy of thyroid (acquired): Secondary | ICD-10-CM

## 2020-02-25 DIAGNOSIS — E7800 Pure hypercholesterolemia, unspecified: Secondary | ICD-10-CM

## 2020-02-25 DIAGNOSIS — I10 Essential (primary) hypertension: Secondary | ICD-10-CM

## 2020-02-25 DIAGNOSIS — R5383 Other fatigue: Secondary | ICD-10-CM

## 2020-02-25 DIAGNOSIS — R7301 Impaired fasting glucose: Secondary | ICD-10-CM

## 2020-02-25 NOTE — Telephone Encounter (Signed)
Pt called and said that she would like to have lab orders and  sent to [facility]  Fax number [phone]  Attn-Janci

## 2020-02-26 NOTE — Addendum Note (Signed)
Addended by: Adair Laundry on: 02/26/2020 04:14 PM   Modules accepted: Orders

## 2020-02-26 NOTE — Telephone Encounter (Signed)
Pt would like to have labs done at [facility]. I have ordered CBC, TSH, A1c, CMP, Lipid panel, microalbumin. Is there anything else that needs to be ordered? I have placed the requisitions in the quick sign folder.

## 2020-02-27 NOTE — Telephone Encounter (Signed)
Pt is following up on lab request. Would like a call back.

## 2020-03-02 NOTE — Telephone Encounter (Signed)
I have placed orders in quick sign folder.

## 2020-03-04 NOTE — Telephone Encounter (Signed)
Orders have been faxed

## 2020-03-08 ENCOUNTER — Encounter: Payer: Self-pay | Admitting: Internal Medicine

## 2020-03-08 LAB — BASIC METABOLIC PANEL
BUN: 27 — AB (ref 4–21)
CO2: 27 — AB (ref 13–22)
Chloride: 104 (ref 99–108)
Creatinine: 1.5 — AB (ref 0.5–1.1)
Glucose: 105
Potassium: 4.1 (ref 3.4–5.3)
Sodium: 141 (ref 137–147)

## 2020-03-08 LAB — TSH: TSH: 4.75 (ref 0.41–5.90)

## 2020-03-08 LAB — HEPATIC FUNCTION PANEL
ALT: 11 (ref 7–35)
AST: 16 (ref 13–35)
Alkaline Phosphatase: 64 (ref 25–125)
Bilirubin, Total: 0.9

## 2020-03-08 LAB — CBC AND DIFFERENTIAL
HCT: 38 (ref 36–46)
Hemoglobin: 13.9 (ref 12.0–16.0)
Platelets: 287 (ref 150–399)
WBC: 6.9

## 2020-03-08 LAB — HEMOGLOBIN A1C: Hemoglobin A1C: 5.8

## 2020-03-09 LAB — LIPID PANEL
Cholesterol: 225 — AB (ref 0–200)
HDL: 44 (ref 35–70)
LDL Cholesterol: 147
Triglycerides: 225 — AB (ref 40–160)

## 2020-03-11 ENCOUNTER — Encounter: Payer: Self-pay | Admitting: Internal Medicine

## 2020-03-11 DIAGNOSIS — E034 Atrophy of thyroid (acquired): Secondary | ICD-10-CM

## 2020-03-11 MED ORDER — LEVOTHYROXINE SODIUM 75 MCG PO TABS: 75.0000 ug | ORAL_TABLET | Freq: Every day | ORAL | 0 refills | Status: DC

## 2020-03-26 ENCOUNTER — Telehealth: Payer: Self-pay | Admitting: Internal Medicine

## 2020-04-30 ENCOUNTER — Other Ambulatory Visit: Payer: Self-pay | Admitting: Internal Medicine

## 2020-04-30 IMAGING — CR DG CHEST 2V
1 series · 2 of 2 positions shown · non-contrast
Comparison: None

CLINICAL DATA: LEFT leg surgery in [REDACTED] 4 weeks ago, atelectasis
on chest radiograph, follow-up

EXAM:
CHEST - 2 VIEW

[Series 1: dg chest 2 view · 0.14mm/px · 2 of 2 slices shown]
[im 1/2]
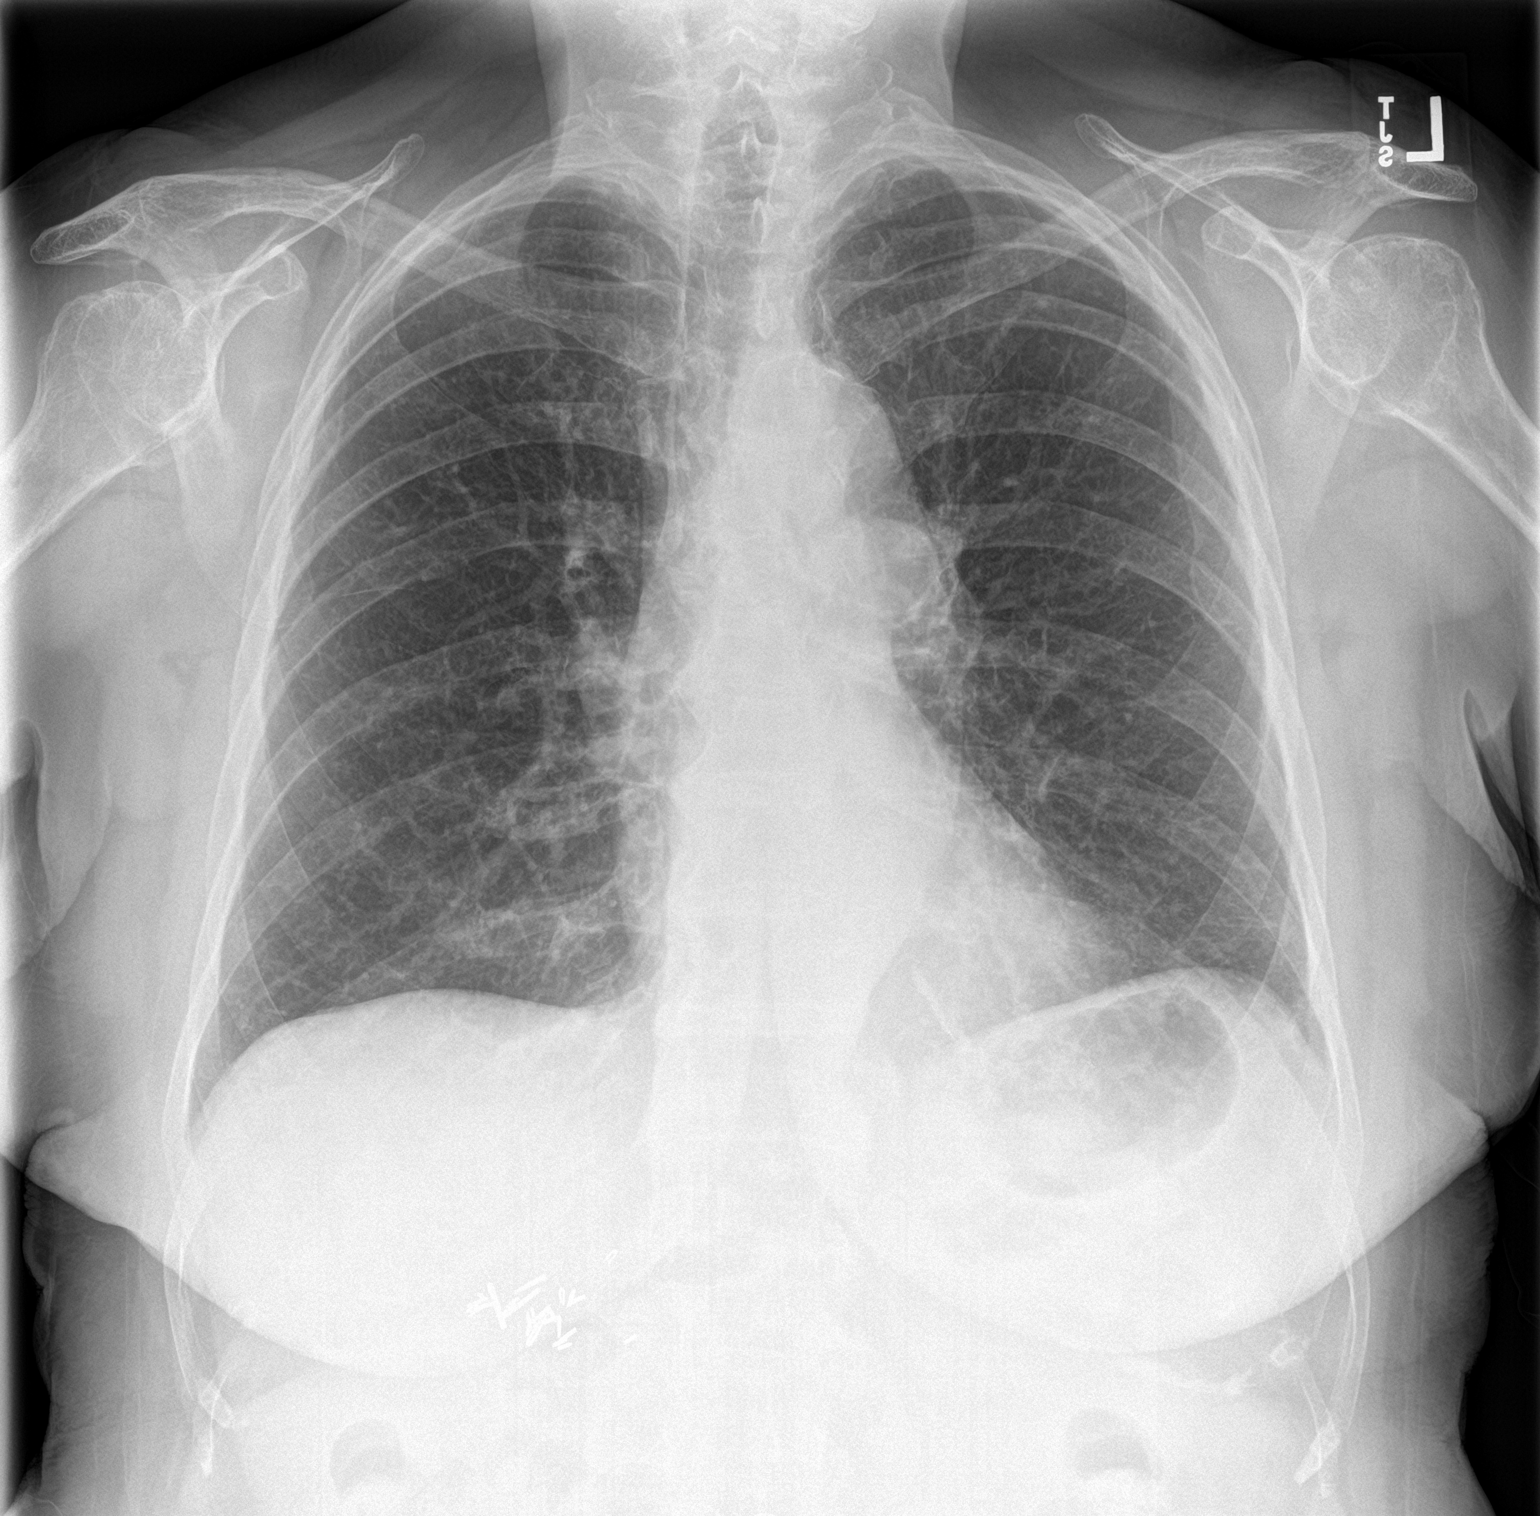
[im 2/2]
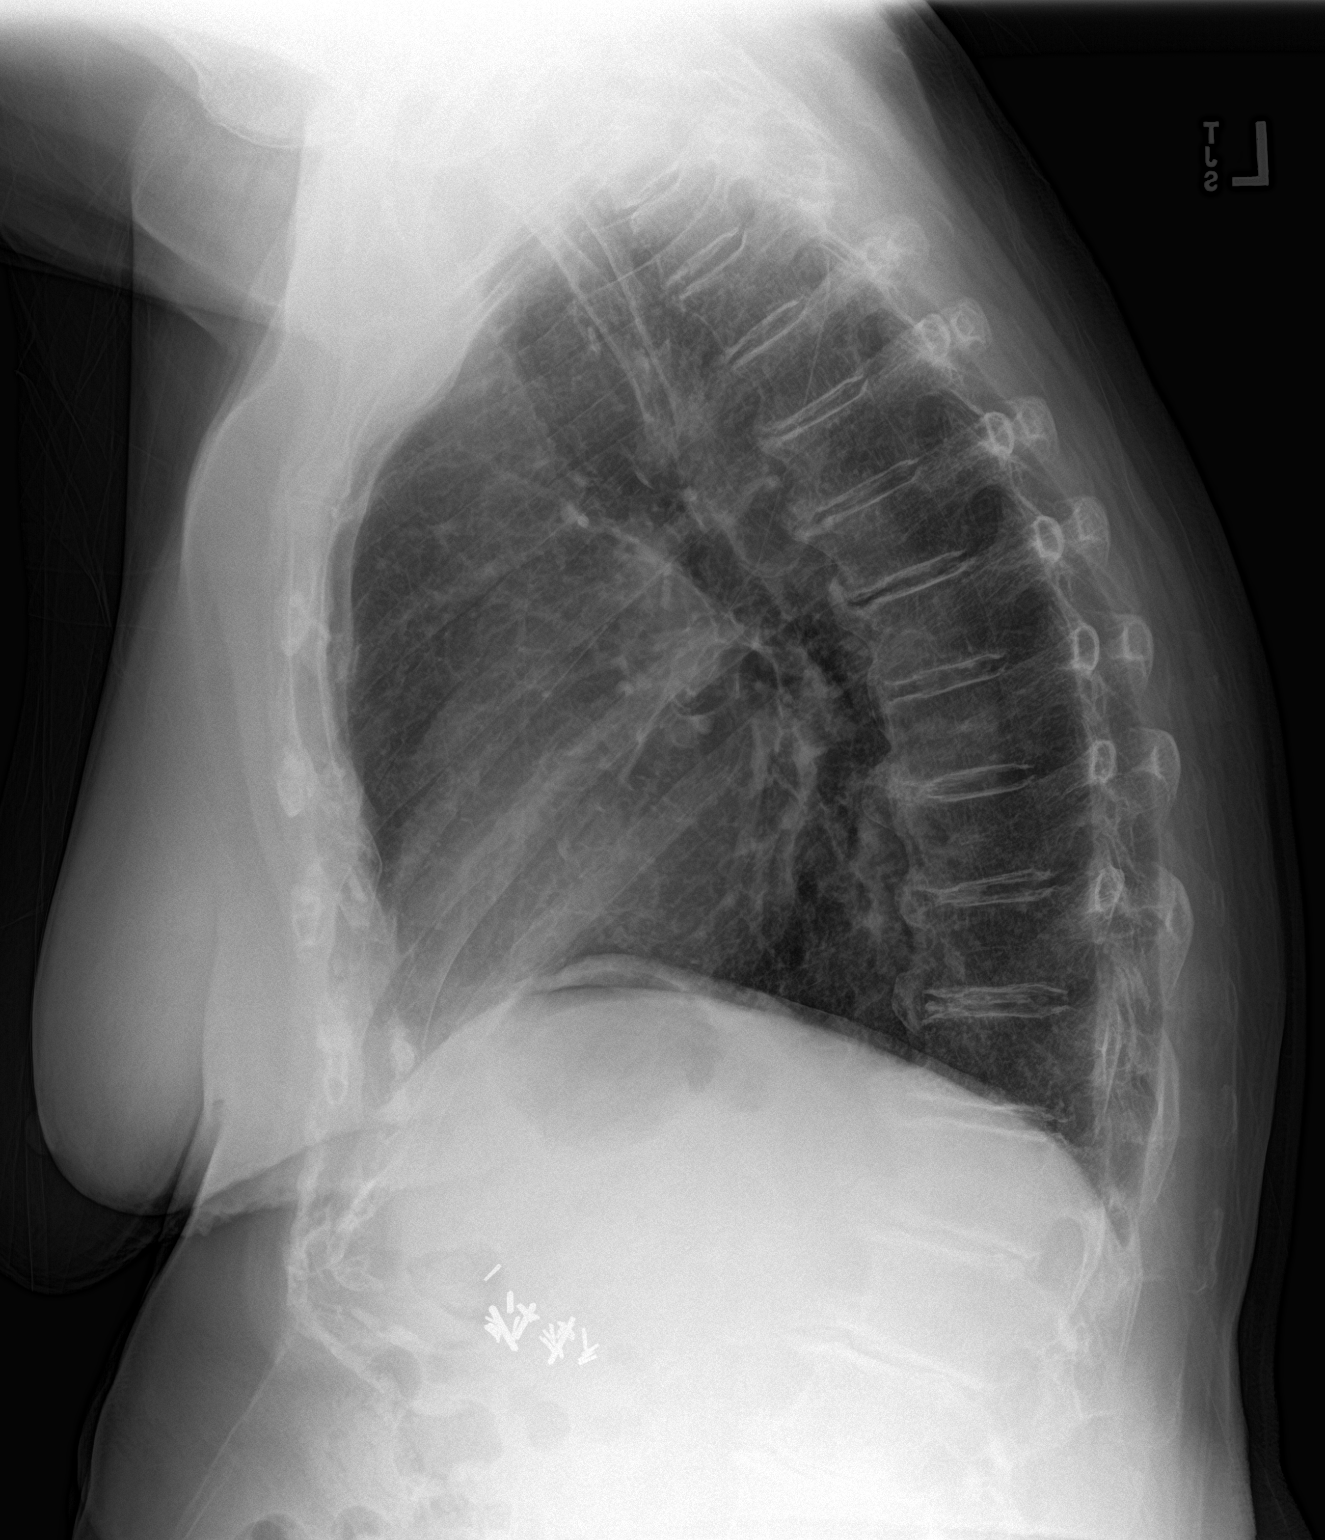

[2 of 2 positions shown; findings below may reference images not displayed]

FINDINGS: Normal heart size and pulmonary vascularity.

Atherosclerotic calcification and mild tortuosity of thoracic aorta.

Mediastinal contours otherwise normal.

Bronchitic changes without infiltrate, pleural effusion, or
pneumothorax.

Bones demineralized with scattered degenerative disc disease changes
of the thoracic spine.

Surgical clips LEFT upper quadrant consistent with history of
cholecystectomy.
IMPRESSION: Bronchitic changes without infiltrate.

## 2020-09-27 ENCOUNTER — Other Ambulatory Visit: Payer: Self-pay

## 2020-09-27 ENCOUNTER — Encounter: Payer: Self-pay | Admitting: Nurse Practitioner

## 2020-09-27 ENCOUNTER — Ambulatory Visit: Payer: Medicare HMO | Admitting: Nurse Practitioner

## 2020-09-27 ENCOUNTER — Ambulatory Visit (INDEPENDENT_AMBULATORY_CARE_PROVIDER_SITE_OTHER): Payer: Medicare HMO

## 2020-09-27 ENCOUNTER — Telehealth: Payer: Self-pay

## 2020-09-27 VITALS — BP 118/64 | HR 88 | Temp 97.8°F | Ht 66.0 in | Wt 183.0 lb

## 2020-09-27 DIAGNOSIS — R1310 Dysphagia, unspecified: Secondary | ICD-10-CM

## 2020-09-27 NOTE — Telephone Encounter (Signed)
Patient has appointment today with Dawson Bills, NP.    [place] Night - Cl TELEPHONE ADVICE RECORD AccessNurse Patient Name: LENNAN MALONE Gender: Female DOB: [birth year] Age: 86 Y 2 M 71 D Return Phone Number: [phone] (Primary) Address: City/State/Zip: [place] [address] Client [facility] [place] Night - Cl Client Site [place] Physician Deborra Medina - MD Contact Type Call Who Is Calling Patient / Member / Family / Caregiver Call Type Triage / Clinical Relationship To Patient Self Return Phone Number [phone] (Primary) Chief Complaint CHOKING / [place] Reason for Call Symptomatic / Request for [place] states she needs an appt. She is having issues breathing. Caller choked taking a pill and she stopped breathing. Her throat is now very sore as well. Caller states this is on the first time this has happened. She has had issues in the past choking and not breathin. Translation No Nurse Assessment Nurse: [place], RN, Estill Bamberg Date/Time (Eastern Time): 09/25/2020 1:20:58 PM Confirm and document reason for call. If symptomatic, describe symptoms. ---Caller states she choked on pill this morning and stopped breathing for ("too long"). Did not pass out. Throat is sore and raspy because she coughed a lot when it happened. (Happened Tuesday also but otherwise not before then) Does the patient have any new or worsening symptoms? ---Yes Will a triage be completed? ---Yes Related visit to physician within the last 2 weeks? ---No Does the PT have any chronic conditions? (i.e. diabetes, asthma, this includes High risk factors for pregnancy, etc.) ---No Is this a behavioral health or substance abuse call? ---No Guidelines Guideline Title Affirmed Question Affirmed Notes Nurse Date/Time (Eastern Time) Choking - Inhaled Foreign Body [1]  Patient cleared the FB spontaneously BUT [2] continues to have coughing, hoarseness, or wheezing > 30 minutes York, RN, Estill Bamberg 09/25/2020 1:23:19 PM Disp. Time Eilene Ghazi Time) Disposition Final User 09/25/2020 1:19:42 PM Send to Urgent Queue [place], [place] PLEASE NOTE: All timestamps contained within this report are represented as [place] Standard Time. CONFIDENTIALTY NOTICE: This fax transmission is intended only for the addressee. It contains information that is legally privileged, confidential or otherwise protected from use or disclosure. If you are not the intended recipient, you are strictly prohibited from reviewing, disclosing, copying using or disseminating any of this information or taking any action in reliance on or regarding this information. If you have received this fax in error, please notify us immediately by telephone so that we can arrange for its return to [place]. Phone: [phone], Toll-Free: [phone], Fax: [phone] Page: 2 of 2 Call Id: [id number] 09/25/2020 1:26:47 PM Go to ED Now Yes York, RN, Shelly Coss Disagree/Comply Comply Caller Understands Yes PreDisposition Call Doctor Care Advice Given Per Guideline GO TO ED NOW: * You need to be seen in the Emergency Department. * Go to the ED at ___________ [place] now. Drive carefully. CARE ADVICE given per Choking - Inhaled FB (Adult) guideline BRING MEDICINES: * Bring a list of your current medicines when you go to the Emergency Department (ER). * Bring the pill bottles too. This will help the doctor (or NP/PA) to make certain you are taking the right medicines and the right dose. Comments User: Darnelle Catalan, RN Date/Time Eilene Ghazi Time): 09/25/2020 1:29:21 PM caller states she uses local ER - keeps talking about scheduling with the dr. Durward Fortes to be seen in ED today but to also follow up with dr regarding recurrent episodes  and possible diffisulties with swallowing. Caller unclear if she will visit  ER Referrals GO TO FACILITY UNDECIDED

## 2020-09-27 NOTE — Progress Notes (Signed)
Established Patient Office Visit  Subjective:  Patient ID: Pamela Paul, female    DOB: October 11, 1926  Age: 86 y.o. MRN: [id number]  CC:  Chief Complaint  Patient presents with  . Acute Visit    easily choking    HPI Pamela Paul a 86 yo who lives at [facility] and presents for choking easily with a large pill and food onset Saturday.  She has placed herself on full liquid diet since Saturday because she is afraid to eat.  Pamela Paul rarely gets heartburn.  She has no history of trouble swallowing or previous EGD that she can recall.  3 weeks ago, she was with her Tuesday night supper club, and she was eating and talking and swallowed, could not breathe.  One of the members did Heimlich on her and she was okay.   This past Tuesday, she was eating in her recliner, and took a sip of coffee after eating a bagel and felt that sensation again.  She felt like she could not breathe. When this occurred, she took deep breaths and found she was just gasping, started to cough, and paged the nurse.  The nurse told her to cough.  She coughed repeatedly and cannot recall if she coughed up anything, but then she was able to breathe.   On Saturday, she took a large vitamin C pill, and that seemed to hang causing her not to be able to breathe for a few seconds, she coughed, and was eventually resolved.  She thinks she may have coughed up a little particle of the pill she is not sure.  Since Saturday, she has placed her self on full liquid diet.  Today,  she had coffee and tomato soup.  She as no problems swallowing small pills and full liquids.  She has decided it is best not to eat or drink while she is in her recliner and is now sitting at the table.  She has noted no shortness of breath, cough, DOE, fevers or chills.  She has had no vomiting with this.  She does not think she aspirated any of her liquids pills or food.   Past Medical History:  Diagnosis Date  . CKD (chronic kidney disease) stage 3,  GFR 30-59 ml/min (HCC)   . Heart disease    mitral valvular  . Hyperlipidemia   . Hypertension   . Hypothyroidism   . Spinal stenosis of lumbar region 2006   s/p surgery    Past Surgical History:  Procedure Laterality Date  . ABDOMINAL HYSTERECTOMY    . bilateral cataract surgery    . CHOLECYSTECTOMY  1980  . JOINT REPLACEMENT     Bilateral knee  . LAPAROSCOPIC SIGMOID COLECTOMY  2011   secondary to benign mass  . REPLACEMENT TOTAL KNEE BILATERAL    . SMALL INTESTINE SURGERY  2010   for SBO  . SPINE SURGERY  2006   [facility], [facility]  . tendon repair     achille heall, left     Family History  Problem Relation Age of Onset  . Arthritis Mother   . Stroke Mother   . Hyperlipidemia Father   . Cancer Father   . Heart disease Paternal Aunt     Social History   Socioeconomic History  . Marital status: Widowed    Spouse name: Not on file  . Number of children: Not on file  . Years of education: Not on file  . Highest education level: Not on file  Occupational History  . Not on file  Tobacco Use  . Smoking status: Former Smoker    Quit date: 03/15/1961    Years since quitting: 59.5  . Smokeless tobacco: Never Used  . Tobacco comment: Quit many years ago.  Vaping Use  . Vaping Use: Never used  Substance and Sexual Activity  . Alcohol use: Not Currently  . Drug use: No  . Sexual activity: Never  Other Topics Concern  . Not on file  Social History Narrative   [place]   Social Determinants of Health   Financial Resource Strain:   . Difficulty of Paying Living Expenses: Not on file  Food Insecurity:   . Worried About Charity fundraiser in the Last Year: Not on file  . Ran Out of Food in the Last Year: Not on file  Transportation Needs:   . Lack of Transportation (Medical): Not on file  . Lack of Transportation (Non-Medical): Not on file  Physical Activity:   . Days of Exercise per Week: Not on file  . Minutes of Exercise per Session: Not on file    Stress:   . Feeling of Stress : Not on file  Social Connections: Unknown  . Frequency of Communication with Friends and Family: More than three times a week  . Frequency of Social Gatherings with Friends and Family: Twice a week  . Attends Religious Services: Not on file  . Active Member of Clubs or Organizations: Yes  . Attends Archivist Meetings: Not on file  . Marital Status: Widowed  Intimate Partner Violence: Not At Risk  . Fear of Current or Ex-Partner: No  . Emotionally Abused: No  . Physically Abused: No  . Sexually Abused: No    Outpatient Medications Prior to Visit  Medication Sig Dispense Refill  . Ascorbic Acid (VITAMIN C) 1000 MG tablet Take 1,000 mg by mouth daily.    . cholecalciferol (VITAMIN D) 1000 units tablet Take 1,000 Units by mouth daily.    Marland Kitchen levothyroxine (SYNTHROID) 50 MCG tablet Take 50 mcg by mouth daily.    . Multiple Vitamins-Minerals (PRESERVISION AREDS PO) Take 1 capsule by mouth 2 (two) times daily.    Marland Kitchen telmisartan (MICARDIS) 20 MG tablet TAKE 1 TABLET BY MOUTH  DAILY 90 tablet 3  . Turmeric 400 MG CAPS Take by mouth.    Arna Medici 75 MCG tablet Take 1 tablet by mouth once daily (Patient not taking: Reported on 09/27/2020) 90 tablet 0   No facility-administered medications prior to visit.    No Known Allergies  Review of Systems Pertinent positives noted in history of present illness and otherwise negative.   Objective:    Physical Exam Constitutional:      Appearance: Normal appearance.  HENT:     Head: Normocephalic.     Mouth/Throat:     Mouth: Mucous membranes are moist.     Pharynx: Oropharynx is clear.     Comments: Post pharynx clear- no swelling.  Cardiovascular:     Rate and Rhythm: Normal rate and regular rhythm.     Pulses: Normal pulses.     Heart sounds: Normal heart sounds.  Pulmonary:     Effort: Pulmonary effort is normal.     Breath sounds: Normal breath sounds. No wheezing, rhonchi or rales.   Abdominal:     Palpations: Abdomen is soft.     Tenderness: There is no abdominal tenderness.  Musculoskeletal:        General:  Normal range of motion.     Cervical back: Normal range of motion and neck supple.  Lymphadenopathy:     Cervical: No cervical adenopathy.  Skin:    General: Skin is warm and dry.  Neurological:     Mental Status: She is alert and oriented to person, place, and time. Mental status is at baseline.  Psychiatric:        Mood and Affect: Mood normal.        Behavior: Behavior normal.     BP 118/64 (BP Location: Left Arm, Patient Position: Sitting, Cuff Size: Normal)   Pulse 88   Temp 97.8 F (36.6 C) (Oral)   Ht 5\' 6"  (1.676 m)   Wt 183 lb (83 kg)   SpO2 97%   BMI 29.54 kg/m  Wt Readings from Last 3 Encounters:  09/27/20 183 lb (83 kg)  02/02/20 183 lb (83 kg)  02/02/20 177 lb (80.3 kg)   Pulse Readings from Last 3 Encounters:  09/27/20 88  01/31/19 71  01/31/19 71    BP Readings from Last 3 Encounters:  09/27/20 118/64  01/31/19 132/64  01/31/19 132/64    Lab Results  Component Value Date   CHOL 225 (A) 03/08/2020   HDL 44 03/08/2020   LDLCALC 147 03/08/2020   LDLDIRECT 132.0 07/12/2017   TRIG 225 (A) 03/08/2020   CHOLHDL 4 07/31/2018      There are no preventive care reminders to display for this patient.  There are no preventive care reminders to display for this patient.  Lab Results  Component Value Date   TSH 4.75 03/08/2020   Lab Results  Component Value Date   WBC 6.9 03/08/2020   HGB 13.9 03/08/2020   HCT 38 03/08/2020   MCV 94.7 07/31/2018   PLT 287 03/08/2020   Lab Results  Component Value Date   NA 141 03/08/2020   K 4.1 03/08/2020   CO2 27 (A) 03/08/2020   GLUCOSE 107 (H) 07/31/2018   BUN 27 (A) 03/08/2020   CREATININE 1.5 (A) 03/08/2020   BILITOT 0.8 07/31/2018   ALKPHOS 64 03/08/2020   AST 16 03/08/2020   ALT 11 03/08/2020   PROT 7.4 07/31/2018   ALBUMIN 4.2 07/31/2018   CALCIUM 9.5 07/31/2018    GFR 32.16 (L) 07/31/2018   Lab Results  Component Value Date   CHOL 225 (A) 03/08/2020   Lab Results  Component Value Date   HDL 44 03/08/2020   Lab Results  Component Value Date   LDLCALC 147 03/08/2020   Lab Results  Component Value Date   TRIG 225 (A) 03/08/2020   Lab Results  Component Value Date   CHOLHDL 4 07/31/2018   Lab Results  Component Value Date   HGBA1C 5.8 03/08/2020      Assessment & Plan:   Problem List Items Addressed This Visit    None    Visit Diagnoses    Dysphagia, unspecified type    -  Primary   Relevant Orders   DG ESOPHAGUS W SINGLE CM (SOL OR THIN BA)    Describing food and pills getting possibly hung up in the esophagus when you swallow.  As we discussed, take the small necessary pills and leave off the larger of vitamins for now.  For now, OK Boost/ensure/Premier protein/ pureed foods and eat slowly, room temperature liquids, and always sit up straight at a table.  No eating in the recliner even if the head is elevated.  Further recommendations pending the  results.   You [facility] [place] is [facility]27 arrive at 9 am at [place] No eating or drinking 3 hours before this study.  I have placed a GI referral , as well.   CXR today to make sure you did not aspirate any liquids/food into the lungs.   Follow up pending results.   No orders of the defined types were placed in this encounter.  Follow-up: No follow-ups on file.   This visit occurred during the SARS-CoV-2 public health emergency.  Safety protocols were in place, including screening questions prior to the visit, additional usage of staff PPE, and extensive cleaning of exam room while observing appropriate contact time as indicated for disinfecting solutions.   Denice Paradise, NP

## 2020-09-27 NOTE — Patient Instructions (Addendum)
Describing food and pills getting possibly hung up in the esophagus when you swallow.  As we discussed, take the small necessary pills and leave off the larger of vitamins for now.  For now, OK Boost/ensure/Premier protein/ liquids and eat slowly, room temperature liquids, and always sit up straight at a table.  No eating in the recliner even if the head is elevated.  Further recommendations pending the results.   You are [facility] [place] is [facility] on [facility]27 arrive at 9 am at [place] No eating or drinking 3 hours before this study.  I have placed a GI referral , as well.   CXR today to make sure you did not aspirate any liquids/food into the lungs.   Follow up pending results.     Dysphagia Eating Plan, Pureed This diet is helpful for people with moderate to severe swallowing problems. Pureed foods are smooth and are prepared without lumps so that they can be swallowed safely. Work with your health care provider and your diet and nutrition specialist (dietitian) to make sure that you are following the diet safely and getting all the nutrients you need. What are tips for following this plan? General instructions  You may eat foods that are soft and have a pudding-like texture.  Do not eat foods that you have to chew. If you have to chew the food, then you cannot eat it.  Avoid foods that are hard, dry, sticky, chunky, lumpy, or stringy. Also avoid foods with nuts, seeds, raisins, skins, or pulp.  You may be instructed to thicken liquids. Follow your health care provider's instructions about how to do this and to what consistency. Cooking   If a food is not originally a smooth texture, you may be able to eat the food after: ? Pureeing it. This can be done with a blender. ? Moistening it. This can be done by adding juice, cooking liquid, gravy, or sauce to a dry food and then pureeing it. For example, you may have bread if you  soak it in milk and puree it.  If a food is too thin, you may add a commercial thickener, corn starch, rice cereal, or potato flakes to thicken it.  Strain and throw away any liquid that separates from a solid pureed food before eating.  Strain lumps, chunks, pulp, and seeds from pureed foods before eating.  Reheat foods slowly to prevent a tough crust from forming. Meal planning  Eat a variety of foods to get all the nutrients you need.  Add dry milk or protein powder to food to increase calories and protein content.  Follow your meal plan as told by your dietitian. What foods are allowed? The items listed may not be a complete list. Talk with your dietitian about what dietary choices are best for you. Grains Soft breads, pancakes, [place] toast, muffins, and bread stuffing pureed to a smooth, moist texture, without nuts or seeds. Cooked cereals that have a pudding-like consistency, such as cream of wheat or farina. Pureed oatmeal. Pureed, well-cooked pasta and rice. Vegetables Pureed vegetables. Smooth tomato paste or sauce. Mashed or pureed potatoes without skin. Fruits Pureed fruits such as melons and apples without seeds or pulp. Mashed bananas. Mashed avocado. Fruit juices without pulp or seeds. Meats and other protein foods Pureed meat, poultry, and fish. Smooth pate or liverwurst. Smooth souffles. Pureed beans (such as lentils). Pureed eggs. Smooth nut and seed butters. Pureed  tofu. Dairy Yogurt. Milk. Pureed cottage cheese. Nutritional dairy drinks or shakes. Cream cheese. Smooth pudding, ice cream, sherbet, and malts. Fats and oils Butter. Margarine. Vegetable oils. Smooth and strained gravy. Sour cream. Mayonnaise. Smooth sauces such as white sauce, cheese sauce, or hollandaise sauce. Sweets and desserts Moistened and pureed cookies and cakes. Whipped topping. Gelatin. Pudding pops. Seasoning and other foods Finely ground spices. Jelly. Honey. Pureed casseroles. Strained  soups. Pureed sandwiches. Beverages Anything prepared at the consistency recommended by your dietitian. What foods are not allowed? The items listed may not be a complete list. Talk with your dietitian about what dietary choices are best for you. Grains Oatmeal. Dry cereals. Hard breads. Breads with seeds or nuts. Whole pasta, rice, or other grains. Whole pancakes, waffles, biscuits, muffins, or rolls. Vegetables Whole vegetables. Stringy vegetables (such as celery). Tomatoes or tomato sauce with seeds. Fried vegetables. Fruits Whole fresh, frozen, canned, or dried fruits that have not been pureed. Stringy fruits, such as pineapple or coconut. Watermelon with seeds. Dried fruit or fruit leather. Meat and other protein foods Whole or ground meat, fish, or poultry. Dried or cooked lentils or legumes that have been cooked but not mashed or pureed. Non-pureed eggs. Nuts and seeds. Crunchy peanut butter. Whole tofu or other meat alternatives. Dairy Cheese cubes or slices. Non-pureed cottage cheese. Yogurt with fruit chunks. Fats and oils All fats and sauces that have lumps or chunks. Sweets and desserts Solid desserts. Sticky, chewy sweets (such as licorice and caramel). Candy with nuts or coconut. Seasoning and other foods Coarse or seeded herbs and spices. Chunky preserves. Jams with seeds. Whole sandwiches. Non-pureed casseroles. Chunky soups. Summary  Pureed foods can be helpful for people with moderate to severe swallowing problems.  On the dysphagia eating plan, you may eat foods that are soft and have a pudding-like texture. You should avoid foods that you have to chew. If you have to chew the food, then you cannot eat it.  You may be instructed to thicken liquids. Follow your health care provider's instructions about how to do this and to what consistency. This information is not intended to replace advice given to you by your health care provider. Make sure you discuss any questions  you have with your health care provider. Document Revised: 03/13/2019 Document Reviewed: 01/23/2017 Elsevier Patient Education  [place].  Dysphagia  Dysphagia is trouble swallowing. This condition occurs when solids and liquids stick in a person's throat on the way down to the stomach, or when food takes longer to get to the stomach than usual. You may have problems swallowing food, liquids, or both. You may also have pain while trying to swallow. It may take you more time and effort to swallow something. What are the causes? This condition may be caused by:  Muscle problems. They may make it difficult for you to move food and liquids through the esophagus, which is the tube that connects your mouth to your stomach.  Blockages. You may have ulcers, scar tissue, or inflammation that blocks the normal passage of food and liquids. Causes of these problems include: ? Acid reflux from your stomach into your esophagus (gastroesophageal reflux). ? Infections. ? Radiation treatment for cancer. ? Medicines taken without enough fluids to wash them down into your stomach.  Stroke. This can affect the nerves and make it difficult to swallow.  Nerve problems. These prevent signals from being sent to the muscles of your esophagus to squeeze (contract) and move what you  swallow down to your stomach.  Globus pharyngeus. This is a common problem that involves a feeling like something is stuck in your throat or a sense of trouble with swallowing, even though nothing is wrong with the swallowing passages.  Certain conditions, such as cerebral palsy or Parkinson's disease. What are the signs or symptoms? Common symptoms of this condition include:  A feeling that solids or liquids are stuck in your throat on the way down to the stomach.  Pain while swallowing.  Coughing or gagging while trying to swallow. Other symptoms include:  Food moving back from your stomach to your mouth  (regurgitation).  Noises coming from your throat.  Chest discomfort with swallowing.  A feeling of fullness when swallowing.  Drooling, especially when the throat is blocked.  Heartburn. How is this diagnosed? This condition may be diagnosed by:  Barium X-ray. In this test, you will swallow a white liquid that sticks to the inside of your esophagus. X-ray images are then taken.  Endoscopy. In this test, a flexible telescope is inserted down your throat to look at your esophagus and your stomach.  CT scans and an MRI. How is this treated? Treatment for dysphagia depends on the cause of this condition, such as:  If the dysphagia is caused by acid reflux or infection, medicines may be used. They may include antibiotics and heartburn medicines.  If the dysphagia is caused by problems with the muscles, swallowing therapy may be used to help you strengthen your swallowing muscles. You may have to do specific exercises to strengthen the muscles or stretch them.  If the dysphagia is caused by a blockage or mass, procedures to remove the blockage may be done. You may need surgery and a feeding tube. You may need to make diet changes. Ask your health care provider for specific instructions. Follow these instructions at home: Medicines  Take over-the-counter and prescription medicines only as told by your health care provider.  If you were prescribed an antibiotic medicine, take it as told by your health care provider. Do not stop taking the antibiotic even if you start to feel better. Eating and drinking   Follow any diet changes as told by your health care provider.  Work with a diet and nutrition specialist (dietitian) to create an eating plan that will help you get the nutrients you need in order to stay healthy.  Eat soft foods that are easier to swallow.  Cut your food into small pieces and eat slowly. Take small bites.  Eat and drink only when you are sitting upright.  Do  not drink alcohol or caffeine. If you need help quitting, ask your health care provider. General instructions  Check your weight every day to make sure you are not losing weight.  Do not use any products that contain nicotine or tobacco, such as cigarettes, e-cigarettes, and chewing tobacco. If you need help quitting, ask your health care provider.  Keep all follow-up visits as told by your health care provider. This is important. Contact a health care provider if you:  Lose weight because you cannot swallow.  Cough when you drink liquids.  Cough up partially digested food. Get help right away if you:  Cannot swallow your saliva.  Have shortness of breath, a fever, or both.  Have a hoarse voice and also have trouble swallowing. Summary  Dysphagia is trouble swallowing. This condition occurs when solids and liquids stick in a person's throat on the way down to the stomach. You  may cough or gag while trying to swallow.  Dysphagia has many possible causes.  Treatment for dysphagia depends on the cause of the condition.  Keep all follow-up visits as told by your health care provider. This is important. This information is not intended to replace advice given to you by your health care provider. Make sure you discuss any questions you have with your health care provider. Document Revised: 04/16/2019 Document Reviewed: 04/16/2019 Elsevier Patient Education  [place].

## 2020-09-29 ENCOUNTER — Ambulatory Visit
Admission: RE | Admit: 2020-09-29 | Discharge: 2020-09-29 | Disposition: A | Discharge status: Home / Self Care | Payer: Medicare HMO | Source: Ambulatory Visit | Attending: Nurse Practitioner | Admitting: Nurse Practitioner

## 2020-09-29 ENCOUNTER — Other Ambulatory Visit: Payer: Self-pay

## 2020-09-29 ENCOUNTER — Telehealth: Payer: Self-pay | Admitting: Nurse Practitioner

## 2020-09-29 DIAGNOSIS — R1310 Dysphagia, unspecified: Secondary | ICD-10-CM | POA: Insufficient documentation

## 2020-09-29 NOTE — Telephone Encounter (Signed)
Good afternoon!  Referral has been cancelled.

## 2020-09-29 NOTE — Telephone Encounter (Signed)
I discussed the patient's symptoms and the barium swallow study with the speech therapist Lou Miner.  She is suspecting that esophageal spasm is causing the food products to stack up and then it reaches a critical point where she has  laryngeal spasm.  This gives her the feeling like she cannot breathe and then causes cough.  She had no aspiration findings on the barium swallow.  Patient has no trouble at all swallowing liquids.  She does not cough after eating.  She feels more like her throat closes up and she cannot breathe.  This correlates very well with laryngeal spasm.    Laryngeal spasm is made worse with cold beverages and caffeine products.  Patient is advised to avoid those items.    She has been doing well with the soft pureed thickened diet with pudding, thick soups.  We can advance her to a soft esophageal diet to include scrambled eggs, macaroni and cheese, and the list as below.  The patient is advised regarding the recommendations below.  There is no indication for modified barium swallow study is at will not give [place] any more information as patient does not have aspiration.  There is no indication for GI referral as the esophagus was patent and there is no indication for an EGD.   PLAN:  Please cancel the GI referral.  Soft diet tips Take small bites of food and chew foods well. Avoid tough meats, fresh "doughy" bread or rolls, hard bread crust, and abrasive foods. Sip fluids when taking solids at meals and snacks to moisten foods. Stop eating when you start to feel full. Eat slowly in a relaxed atmosphere. Choose decaffeinated coffee, tea, or caffeine-free soft drinks. Sit upright when eating. Remain in a sitting position for at least 45-60 minutes after eating. Try to avoid eating for 3 hours before bedtime. Eat small, frequent meals and snacks. The diet plan Easily digestible foods are the best choice, as is avoiding carbonated drinks or beverages that are very hot or very  cold. Your physician or nutritionist may further limit your intake of citrus, mint, or caffeinated drinks.

## 2020-11-01 ENCOUNTER — Other Ambulatory Visit: Payer: Self-pay | Admitting: Internal Medicine

## 2021-01-11 DIAGNOSIS — E875 Hyperkalemia: Secondary | ICD-10-CM | POA: Insufficient documentation

## 2021-01-13 ENCOUNTER — Other Ambulatory Visit: Payer: Self-pay | Admitting: Internal Medicine

## 2021-02-02 ENCOUNTER — Ambulatory Visit: Payer: Self-pay

## 2021-03-01 ENCOUNTER — Ambulatory Visit (INDEPENDENT_AMBULATORY_CARE_PROVIDER_SITE_OTHER): Payer: Medicare HMO

## 2021-03-01 ENCOUNTER — Other Ambulatory Visit: Payer: Self-pay

## 2021-03-01 VITALS — BP 136/78 | HR 86 | Ht 66.0 in | Wt 183.0 lb

## 2021-03-01 DIAGNOSIS — Z0000 Encounter for general adult medical examination without abnormal findings: Secondary | ICD-10-CM

## 2021-03-01 NOTE — Progress Notes (Signed)
Subjective:   Pamela Paul is a 87 y.o. female who presents for Medicare Annual (Subsequent) preventive examination.  Review of Systems    No ROS.  Medicare Wellness Virtual Visit.     Cardiac Risk Factors include: advanced age (>48men, >64 women);hypertension     Objective:    Today's Vitals   03/01/21 0847 03/01/21 1023  BP:  136/78  Pulse:  86  SpO2:  98%  Weight: 183 lb (83 kg) 183 lb (83 kg)  Height: 5\' 6"  (1.676 m) 5\' 6"  (1.676 m)   Body mass index is 29.54 kg/m.  Advanced Directives 03/01/2021 02/02/2020 01/31/2019 11/02/2017 10/11/2016  Does Patient Have a Medical Advance Directive? Yes Yes Yes Yes Yes  Type of Advance Directive - Healthcare Power of [place];Living will Out of facility DNR (pink MOST or yellow form) [place];Living will  Does patient want to make changes to medical advance directive? No - Patient declined No - Patient declined No - Patient declined No - Patient declined -  Copy of Pamela Paul in Chart? - No - copy requested No - copy requested - Yes  Pre-existing out of facility DNR order (yellow form or pink MOST form) - - - Yellow form placed in chart (order not valid for inpatient use) -    Current Medications (verified) Outpatient Encounter Medications as of 03/01/2021  Medication Sig  . Ascorbic Acid (VITAMIN C) 1000 MG tablet Take 1,000 mg by mouth daily.  . cholecalciferol (VITAMIN D) 1000 units tablet Take 1,000 Units by mouth daily.  Marland Kitchen levothyroxine (SYNTHROID) 50 MCG tablet TAKE 1 TABLET BY MOUTH  DAILY  . Multiple Vitamins-Minerals (PRESERVISION AREDS PO) Take 1 capsule by mouth 2 (two) times daily.  Marland Kitchen telmisartan (MICARDIS) 20 MG tablet TAKE 1 TABLET BY MOUTH DAILY  . Turmeric 400 MG CAPS Take by mouth.   No facility-administered encounter medications on file as of 03/01/2021.    Allergies (verified) Patient has no known allergies.   History: Past Medical History:   Diagnosis Date  . CKD (chronic kidney disease) stage 3, GFR 30-59 ml/min (HCC)   . Heart disease    mitral valvular  . Hyperlipidemia   . Hypertension   . Hypothyroidism   . Spinal stenosis of lumbar region 2006   s/p surgery   Past Surgical History:  Procedure Laterality Date  . ABDOMINAL HYSTERECTOMY    . bilateral cataract surgery    . CHOLECYSTECTOMY  1980  . JOINT REPLACEMENT     Bilateral knee  . LAPAROSCOPIC SIGMOID COLECTOMY  2011   secondary to benign mass  . REPLACEMENT TOTAL KNEE BILATERAL    . SMALL INTESTINE SURGERY  2010   for SBO  . SPINE SURGERY  2006   [facility], [facility]  . tendon repair     achille heall, left    Family History  Problem Relation Age of Onset  . Arthritis Mother   . Stroke Mother   . Hyperlipidemia Father   . Cancer Father   . Heart disease Paternal Aunt    Social History   Socioeconomic History  . Marital status: Widowed    Spouse name: Not on file  . Number of children: Not on file  . Years of education: Not on file  . Highest education level: Not on file  Occupational History  . Not on file  Tobacco Use  . Smoking status: Former Smoker    Quit date: 03/15/1961  Years since quitting: 60.0  . Smokeless tobacco: Never Used  . Tobacco comment: Quit many years ago.  Vaping Use  . Vaping Use: Never used  Substance and Sexual Activity  . Alcohol use: Not Currently  . Drug use: No  . Sexual activity: Never  Other Topics Concern  . Not on file  Social History Narrative   Twin Pamela Paul lives   Social Determinants of Health   Financial Resource Strain: Low Risk   . Difficulty of Paying Living Expenses: Not hard at all  Food Insecurity: No Food Insecurity  . Worried About Charity fundraiser in the Last Year: Never true  . Ran Out of Food in the Last Year: Never true  Transportation Needs: No Transportation Needs  . Lack of Transportation (Medical): No  . Lack of Transportation (Non-Medical): No  Physical Activity:  Sufficiently Active  . Days of Exercise per Week: 5 days  . Minutes of Exercise per Session: 60 min  Stress: No Stress Concern Present  . Feeling of Stress : Not at all  Social Connections: Unknown  . Frequency of Communication with Friends and Family: More than three times a week  . Frequency of Social Gatherings with Friends and Family: Twice a week  . Attends Religious Services: Not on file  . Active Member of Clubs or Organizations: Yes  . Attends Archivist Meetings: Not on file  . Marital Status: Widowed    Tobacco Counseling Counseling given: Not Answered Comment: Quit many years ago.   Clinical Intake:  Pre-visit preparation completed: Yes        Diabetes: No  How often do you need to have someone help you when you read instructions, pamphlets, or other written materials from your doctor or pharmacy?: 1 - Never   Interpreter Needed?: No      Activities of Daily Living In your present state of health, do you have any difficulty performing the following activities: 03/01/2021  Hearing? Y  Vision? Y  Comment Reports Macular Degeneration worsening  Difficulty concentrating or making decisions? Y  Walking or climbing stairs? Y  Comment Rolling walker in use. Paces self.  Dressing or bathing? N  Doing errands, shopping? Y  Preparing Food and eating ? Y  Comment Meal assist with aide/staff. Self feeds  Using the Toilet? N  In the past six months, have you accidently leaked urine? N  Do you have problems with loss of bowel control? N  Managing your Medications? N  Managing your Finances? Y  Comment Son Writer or managing your Housekeeping? Y  Comment Maid assist  Some recent data might be hidden    Patient Care Team: [name], MD as PCP - General (Internal Medicine)  Indicate any recent Medical Services you may have received from other than [facility] providers in the past year (date may be approximate).     Assessment:   This  is a routine wellness examination for Pamela Paul.  Hearing/Vision screen  Hearing Screening   125Hz  250Hz  500Hz  1000Hz  2000Hz  3000Hz  4000Hz  6000Hz  8000Hz   Right ear:           Left ear:           Comments: Hearing aid, bilateral   Vision Screening Comments: [facility]  Wears corrective lenses  Visits every 6 months  Cataract extraction, bilateral  Macular Degeneration; dry Visual acuity not assessed per patient preference since they have regular follow up with the ophthalmologist  Dietary issues and  exercise activities discussed: Current Exercise Habits: Home exercise routine, Type of exercise: calisthenics;strength training/weights (swimming), Time (Minutes): 60, Frequency (Times/Week): 5, Weekly Exercise (Minutes/Week): 300, Intensity: Mild  Regular diet Good water intake  Goals      Patient Stated   .  Increase physical activity (pt-stated)      Resume exercise regimen of swimming daily       Depression Screen PHQ 2/9 Scores 03/01/2021 02/02/2020 01/31/2019 11/02/2017 10/11/2016 04/26/2015 10/26/2014  PHQ - 2 Score 0 0 0 0 0 0 0  PHQ- 9 Score - - - 0 - - -    Fall Risk Fall Risk  03/01/2021 02/02/2020 01/31/2019 01/16/2018 11/02/2017  Falls in the past year? 0 0 1 No Yes  Number falls in past yr: 0 - 0 - 1  Injury with Fall? 0 - 1 - No  Comment - - She slipped getting out of the hot tub - Walked on uneven ground  Risk Factor Category  - - - - -  Risk for fall due to : - - - History of fall(s) -  Follow up Falls evaluation completed Falls evaluation completed Falls prevention discussed;Education provided - Falls prevention discussed;Education provided  Comment - - - - Cane in use when out of the home, walker in use within the home.    FALL RISK PREVENTION PERTAINING TO THE HOME: Handrails in use when climbing stairs? Yes Home free of loose throw rugs in walkways, pet beds, electrical cords, etc? Yes  Adequate lighting in your home to reduce risk of falls? Yes   ASSISTIVE  DEVICES UTILIZED TO PREVENT FALLS: Life alert? Yes  Use of a cane, walker or w/c? Yes  Grab bars in the bathroom? Yes  Shower chair or bench in shower? Yes  Elevated toilet seat or a handicapped toilet? Yes   TIMED UP AND GO: Was the test performed? Yes . Patient uses rolling walker. No assistance required with standing/sitting. Walks a distance of 30 feet in appropriate time. Steady with walker.   Cognitive Function: Patient is alert and oriented x3.  Denies difficulty making decisions, focusing, memory loss.  Enjoys reading and crossword puzzles and participates in a Shakespeare group.  MMSE/6CIT declined.   MMSE - Mini Mental State Exam 10/11/2016  Orientation to time 5  Orientation to Place 5  Registration 3  Attention/ Calculation 5  Recall 3  Language- name 2 objects 2  Language- repeat 1  Language- follow 3 step command 3  Language- read & follow direction 1  Write a sentence 1  Copy design 1  Total score 30     6CIT Screen 02/02/2020 01/31/2019 11/02/2017 10/11/2016  What Year? 0 points 0 points 0 points 0 points  What month? 0 points 0 points 0 points 0 points  What time? 0 points 0 points 0 points 0 points  Count back from 20 0 points 0 points 0 points 0 points  Months in reverse 0 points 0 points 0 points 0 points  Repeat phrase 0 points - 0 points 0 points  Total Score 0 - 0 0    Immunizations Immunization History  Administered Date(s) Administered  . Influenza Split 10/03/2014, 10/04/2015  . Influenza, High Dose Seasonal PF 10/04/2019, 09/21/2020  . Influenza-Unspecified 09/03/2013, 10/01/2016, 10/06/2017, 09/18/2018  . Moderna Sars-Covid-2 Vaccination 12/22/2019, 01/19/2020  . Pneumococcal Conjugate-13 10/26/2014  . Pneumococcal Polysaccharide-23 04/04/1993, 09/20/2008, 01/28/2016  . Tdap 02/14/2013  . Zoster 12/08/2009  . Zoster Recombinat (Shingrix) 02/07/2017, 06/18/2017    Health  Maintenance Health Maintenance  Topic Date Due  . COVID-19 Vaccine  (3 - Booster for Moderna series) 03/17/2021 (Originally 07/18/2020)  . TETANUS/TDAP  02/15/2023  . INFLUENZA VACCINE  Completed  . DEXA SCAN  Completed  . PNA vac Low Risk Adult  Completed  . HPV VACCINES  Aged Out    Colorectal cancer screening: No longer required.   Mammogram status: No longer required due to aged out..  Lung Cancer Screening: (Low Dose CT Chest recommended if Age 54-80 years, 30 pack-year currently smoking OR have quit w/in 15years.) does not qualify.   Hepatitis C Screening: does not qualify.  Vision Screening: Recommended annual ophthalmology exams for early detection of glaucoma and other disorders of the eye. Is the patient up to date with their annual eye exam?  Yes   Dental Screening: Recommended annual dental exams for proper oral hygiene.  Community Resource Referral / Chronic Care Management: CRR required this visit?  No   CCM required this visit?  No      Plan:    Keep all routine maintenance appointments.   I have personally reviewed and noted the following in the patient's chart:   . Medical and social history . Use of alcohol, tobacco or illicit drugs  . Current medications and supplements . Functional ability and status . Nutritional status . Physical activity . Advanced directives . List of other physicians . Hospitalizations, surgeries, and ER visits in previous 12 months . Vitals . Screenings to include cognitive, depression, and falls . Referrals and appointments  In addition, I have reviewed and discussed with patient certain preventive protocols, quality metrics, and best practice recommendations. A written personalized care plan for preventive services as well as general preventive health recommendations were provided to patient.  Varney Biles, LPN   [id number]

## 2021-03-01 NOTE — Patient Instructions (Addendum)
Pamela Paul , Thank you for taking time to come for your Medicare Wellness Visit. I appreciate your ongoing commitment to your health goals. Please review the following plan we discussed and let me know if I can assist you in the future.   These are the goals we discussed: Goals      Patient Stated   .  Increase physical activity (pt-stated)      Resume exercise regimen of swimming daily        This is a list of the screening recommended for you and due dates:  Health Maintenance  Topic Date Due  . COVID-19 Vaccine (3 - Booster for Moderna series) 03/17/2021*  . Tetanus Vaccine  02/15/2023  . Flu Shot  Completed  . DEXA scan (bone density measurement)  Completed  . Pneumonia vaccines  Completed  . HPV Vaccine  Aged Out  *Topic was postponed. The date shown is not the original due date.    Immunizations Immunization History  Administered Date(s) Administered  . Influenza Split 10/03/2014, 10/04/2015  . Influenza, High Dose Seasonal PF 10/04/2019, 09/21/2020  . Influenza-Unspecified 09/03/2013, 10/01/2016, 10/06/2017, 09/18/2018  . Moderna Sars-Covid-2 Vaccination 12/22/2019, 01/19/2020  . Pneumococcal Conjugate-13 10/26/2014  . Pneumococcal Polysaccharide-23 04/04/1993, 09/20/2008, 01/28/2016  . Tdap 02/14/2013  . Zoster 12/08/2009  . Zoster Recombinat (Shingrix) 02/07/2017, 06/18/2017   Advanced directives: complete.   Conditions/risks identified: none new.   Follow up in one year for your annual wellness visit.   Preventive Care 15 Years and Older, Female Preventive care refers to lifestyle choices and visits with your health care provider that can promote health and wellness. What does preventive care include?  A yearly physical exam. This is also called an annual well check.  Dental exams once or twice a year.  Routine eye exams. Ask your health care provider how often you should have your eyes checked.  Personal lifestyle choices, including:  Daily care of  your teeth and gums.  Regular physical activity.  Eating a healthy diet.  Avoiding tobacco and drug use.  Limiting alcohol use.  Practicing safe sex.  Taking low-dose aspirin every day.  Taking vitamin and mineral supplements as recommended by your health care provider. What happens during an annual well check? The services and screenings done by your health care provider during your annual well check will depend on your age, overall health, lifestyle risk factors, and family history of disease. Counseling  Your health care provider may ask you questions about your:  Alcohol use.  Tobacco use.  Drug use.  Emotional well-being.  Home and relationship well-being.  Sexual activity.  Eating habits.  History of falls.  Memory and ability to understand (cognition).  Work and work Statistician.  Reproductive health. Screening  You may have the following tests or measurements:  Height, weight, and BMI.  Blood pressure.  Lipid and cholesterol levels. These may be checked every 5 years, or more frequently if you are over 12 years old.  Skin check.  Lung cancer screening. You may have this screening every year starting at age 64 if you have a 30-pack-year history of smoking and currently smoke or have quit within the past 15 years.  Fecal occult blood test (FOBT) of the stool. You may have this test every year starting at age 27.  Flexible sigmoidoscopy or colonoscopy. You may have a sigmoidoscopy every 5 years or a colonoscopy every 10 years starting at age 63.  Hepatitis C blood test.  Hepatitis B blood test.  Sexually transmitted disease (STD) testing.  Diabetes screening. This is done by checking your blood sugar (glucose) after you have not eaten for a while (fasting). You may have this done every 1-3 years.  Bone density scan. This is done to screen for osteoporosis. You may have this done starting at age 81.  Mammogram. This may be done every 1-2 years.  Talk to your health care provider about how often you should have regular mammograms. Talk with your health care provider about your test results, treatment options, and if necessary, the need for more tests. Vaccines  Your health care provider may recommend certain vaccines, such as:  Influenza vaccine. This is recommended every year.  Tetanus, diphtheria, and acellular pertussis (Tdap, Td) vaccine. You may need a Td booster every 10 years.  Zoster vaccine. You may need this after age 63.  Pneumococcal 13-valent conjugate (PCV13) vaccine. One dose is recommended after age 31.  Pneumococcal polysaccharide (PPSV23) vaccine. One dose is recommended after age 28. Talk to your health care provider about which screenings and vaccines you need and how often you need them. This information is not intended to replace advice given to you by your health care provider. Make sure you discuss any questions you have with your health care provider. Document Released: 12/17/2015 Document Revised: 08/09/2016 Document Reviewed: 09/21/2015 Elsevier Interactive Patient Education  2017 [place] Prevention in the Home Falls can cause injuries. They can happen to people of all ages. There are many things you can do to make your home safe and to help prevent falls. What can I do on the outside of my home?  Regularly fix the edges of walkways and driveways and fix any cracks.  Remove anything that might make you trip as you walk through a door, such as a raised step or threshold.  Trim any bushes or trees on the path to your home.  Use bright outdoor lighting.  Clear any walking paths of anything that might make someone trip, such as rocks or tools.  Regularly check to see if handrails are loose or broken. Make sure that both sides of any steps have handrails.  Any raised decks and porches should have guardrails on the edges.  Have any leaves, snow, or ice cleared regularly.  Use sand or  salt on walking paths during winter.  Clean up any spills in your garage right away. This includes oil or grease spills. What can I do in the bathroom?  Use night lights.  Install grab bars by the toilet and in the tub and shower. Do not use towel bars as grab bars.  Use non-skid mats or decals in the tub or shower.  If you need to sit down in the shower, use a plastic, non-slip stool.  Keep the floor dry. Clean up any water that spills on the floor as soon as it happens.  Remove soap buildup in the tub or shower regularly.  Attach bath mats securely with double-sided non-slip rug tape.  Do not have throw rugs and other things on the floor that can make you trip. What can I do in the bedroom?  Use night lights.  Make sure that you have a light by your bed that is easy to reach.  Do not use any sheets or blankets that are too big for your bed. They should not hang down onto the floor.  Have a firm chair that has side arms. You can use this for support while you get  dressed.  Do not have throw rugs and other things on the floor that can make you trip. What can I do in the kitchen?  Clean up any spills right away.  Avoid walking on wet floors.  Keep items that you use a lot in easy-to-reach places.  If you need to reach something above you, use a strong step stool that has a grab bar.  Keep electrical cords out of the way.  Do not use floor polish or wax that makes floors slippery. If you must use wax, use non-skid floor wax.  Do not have throw rugs and other things on the floor that can make you trip. What can I do with my stairs?  Do not leave any items on the stairs.  Make sure that there are handrails on both sides of the stairs and use them. Fix handrails that are broken or loose. Make sure that handrails are as long as the stairways.  Check any carpeting to make sure that it is firmly attached to the stairs. Fix any carpet that is loose or worn.  Avoid having  throw rugs at the top or bottom of the stairs. If you do have throw rugs, attach them to the floor with carpet tape.  Make sure that you have a light switch at the top of the stairs and the bottom of the stairs. If you do not have them, ask someone to add them for you. What else can I do to help prevent falls?  Wear shoes that:  Do not have high heels.  Have rubber bottoms.  Are comfortable and fit you well.  Are closed at the toe. Do not wear sandals.  If you use a stepladder:  Make sure that it is fully opened. Do not climb a closed stepladder.  Make sure that both sides of the stepladder are locked into place.  Ask someone to hold it for you, if possible.  Clearly mark and make sure that you can see:  Any grab bars or handrails.  First and last steps.  Where the edge of each step is.  Use tools that help you move around (mobility aids) if they are needed. These include:  Canes.  Walkers.  Scooters.  Crutches.  Turn on the lights when you go into a dark area. Replace any light bulbs as soon as they burn out.  Set up your furniture so you have a clear path. Avoid moving your furniture around.  If any of your floors are uneven, fix them.  If there are any pets around you, be aware of where they are.  Review your medicines with your doctor. Some medicines can make you feel dizzy. This can increase your chance of falling. Ask your doctor what other things that you can do to help prevent falls. This information is not intended to replace advice given to you by your health care provider. Make sure you discuss any questions you have with your health care provider. Document Released: 09/16/2009 Document Revised: 04/27/2016 Document Reviewed: 12/25/2014 Elsevier Interactive Patient Education  2017 [employer].

## 2021-04-08 ENCOUNTER — Other Ambulatory Visit: Payer: Self-pay | Admitting: Internal Medicine

## 2021-05-30 ENCOUNTER — Encounter: Payer: Self-pay | Admitting: Internal Medicine

## 2021-05-31 ENCOUNTER — Other Ambulatory Visit: Payer: Self-pay

## 2021-05-31 ENCOUNTER — Encounter: Payer: Self-pay | Admitting: Internal Medicine

## 2021-05-31 ENCOUNTER — Ambulatory Visit: Payer: Medicare HMO | Admitting: Internal Medicine

## 2021-05-31 VITALS — BP 126/78 | HR 78 | Temp 98.0°F | Ht 65.98 in | Wt 187.6 lb

## 2021-05-31 DIAGNOSIS — C50911 Malignant neoplasm of unspecified site of right female breast: Secondary | ICD-10-CM

## 2021-05-31 DIAGNOSIS — N6310 Unspecified lump in the right breast, unspecified quadrant: Secondary | ICD-10-CM

## 2021-05-31 NOTE — Progress Notes (Signed)
Chief Complaint  Patient presents with   Breast Mass    Pt states she beileves there is a lump on her breast. Pt discovered it about 3-4 days ago. Pt states its on side of rt breast.   F/u  1. Lump right side of breast ~ 9 oclock was hard now soft noticed x 3-4 days no pain nothing trie d   Review of Systems  Respiratory:  Negative for shortness of breath.   Cardiovascular:  Negative for chest pain.  Genitourinary:        Breast lump   Past Medical History:  Diagnosis Date   CKD (chronic kidney disease) stage 3, GFR 30-59 ml/min (HCC)    Heart disease    mitral valvular   Hyperlipidemia    Hypertension    Hypothyroidism    Spinal stenosis of lumbar region 2006   s/p surgery   Past Surgical History:  Procedure Laterality Date   ABDOMINAL HYSTERECTOMY     bilateral cataract surgery     CHOLECYSTECTOMY  1980   JOINT REPLACEMENT     Bilateral knee   LAPAROSCOPIC SIGMOID COLECTOMY  2011   secondary to benign mass   REPLACEMENT TOTAL KNEE BILATERAL     SMALL INTESTINE SURGERY  2010   for SBO   SPINE SURGERY  2006   [facility], [facility]   tendon repair     achille heall, left    Family History  Problem Relation Age of Onset   Arthritis Mother    Stroke Mother    Hyperlipidemia Father    Cancer Father    Heart disease Paternal Aunt    Social History   Socioeconomic History   Marital status: Widowed    Spouse name: Not on file   Number of children: Not on file   Years of education: Not on file   Highest education level: Not on file  Occupational History   Not on file  Tobacco Use   Smoking status: Former    Pack years: 0.00    Types: Cigarettes    Quit date: 03/15/1961    Years since quitting: 60.2   Smokeless tobacco: Never   Tobacco comments:    Quit many years ago.  Vaping Use   Vaping Use: Never used  Substance and Sexual Activity   Alcohol use: Not Currently   Drug use: No   Sexual activity: Never  Other Topics Concern   Not on file  Social History  Narrative   [place]   Social Determinants of Health   Financial Resource Strain: Low Risk    Difficulty of Paying Living Expenses: Not hard at all  Food Insecurity: No Food Insecurity   Worried About Charity fundraiser in the Last Year: Never true   Arboriculturist in the Last Year: Never true  Transportation Needs: No Transportation Needs   Lack of Transportation (Medical): No   Lack of Transportation (Non-Medical): No  Physical Activity: Sufficiently Active   Days of Exercise per Week: 5 days   Minutes of Exercise per Session: 60 min  Stress: No Stress Concern Present   Feeling of Stress : Not at all  Social Connections: Unknown   Frequency of Communication with Friends and Family: More than three times a week   Frequency of Social Gatherings with Friends and Family: Twice a week   Attends Religious Services: Not on Electrical engineer or Organizations: Yes   Attends Archivist  Meetings: Not on file   Marital Status: Widowed  Human resources officer Violence: Not At Risk   Fear of Current or Ex-Partner: No   Emotionally Abused: No   Physically Abused: No   Sexually Abused: No   Current Meds  Medication Sig   Ascorbic Acid (VITAMIN C) 1000 MG tablet Take 1,000 mg by mouth daily.   cholecalciferol (VITAMIN D) 1000 units tablet Take 1,000 Units by mouth daily.   levothyroxine (SYNTHROID) 50 MCG tablet TAKE 1 TABLET BY MOUTH  DAILY   Multiple Vitamins-Minerals (PRESERVISION AREDS PO) Take 1 capsule by mouth 2 (two) times daily.   telmisartan (MICARDIS) 20 MG tablet TAKE 1 TABLET BY MOUTH DAILY   Turmeric 400 MG CAPS Take by mouth.   No Known Allergies No results found for this or any previous visit (from the past 2160 hour(s)). Objective  Body mass index is 30.29 kg/m. Wt Readings from Last 3 Encounters:  05/31/21 187 lb 9.6 oz (85.1 kg)  03/01/21 183 lb (83 kg)  09/27/20 183 lb (83 kg)   Temp Readings from Last 3 Encounters:  05/31/21 98 F  (36.7 C)  09/27/20 97.8 F (36.6 C) (Oral)  01/31/19 97.6 F (36.4 C) (Oral)   BP Readings from Last 3 Encounters:  05/31/21 126/78  03/01/21 136/78  09/27/20 118/64   Pulse Readings from Last 3 Encounters:  05/31/21 78  03/01/21 86  09/27/20 88    Physical Exam Vitals and nursing note reviewed.  Constitutional:      Appearance: Normal appearance. She is well-developed and well-groomed.  HENT:     Head: Normocephalic and atraumatic.  Eyes:     Conjunctiva/sclera: Conjunctivae normal.     Pupils: Pupils are equal, round, and reactive to light.  Cardiovascular:     Rate and Rhythm: Normal rate and regular rhythm.     Heart sounds: Normal heart sounds. No murmur heard. Pulmonary:     Effort: Pulmonary effort is normal.     Breath sounds: Normal breath sounds.  Chest:     Chest wall: No mass.  Breasts:    Breasts are symmetrical.     Right: Mass present. No axillary adenopathy.     Left: Normal. No axillary adenopathy.    Lymphadenopathy:     Upper Body:     Right upper body: No axillary adenopathy.     Left upper body: No axillary adenopathy.  Skin:    General: Skin is warm and dry.  Neurological:     General: No focal deficit present.     Mental Status: She is alert and oriented to person, place, and time. Mental status is at baseline.     Gait: Gait normal.  Psychiatric:        Attention and Perception: Attention and perception normal.        Mood and Affect: Mood and affect normal.        Speech: Speech normal.        Behavior: Behavior normal. Behavior is cooperative.        Thought Content: Thought content normal.        Cognition and Memory: Cognition and memory normal.        Judgment: Judgment normal.    Assessment  Plan  Breast mass, right - Plan: MM DIAG BREAST TOMO BILATERAL, US BREAST LTD UNI RIGHT INC AXILLA   9 oclock right breast could be dense breast tissue vs other    Provider: Dr. Olivia Mackie McLean-Scocuzza-Internal Medicine

## 2021-06-02 ENCOUNTER — Ambulatory Visit: Payer: Medicare HMO | Admitting: Adult Health

## 2021-06-08 ENCOUNTER — Ambulatory Visit
Admission: RE | Admit: 2021-06-08 | Discharge: 2021-06-08 | Disposition: A | Discharge status: Home / Self Care | Payer: Medicare HMO | Source: Ambulatory Visit | Attending: Internal Medicine | Admitting: Internal Medicine

## 2021-06-08 ENCOUNTER — Other Ambulatory Visit: Payer: Self-pay

## 2021-06-08 DIAGNOSIS — N6310 Unspecified lump in the right breast, unspecified quadrant: Secondary | ICD-10-CM

## 2021-06-08 DIAGNOSIS — N6311 Unspecified lump in the right breast, upper outer quadrant: Secondary | ICD-10-CM | POA: Insufficient documentation

## 2021-06-09 ENCOUNTER — Other Ambulatory Visit: Payer: Self-pay | Admitting: Internal Medicine

## 2021-06-09 DIAGNOSIS — N6310 Unspecified lump in the right breast, unspecified quadrant: Secondary | ICD-10-CM

## 2021-06-09 DIAGNOSIS — R928 Other abnormal and inconclusive findings on diagnostic imaging of breast: Secondary | ICD-10-CM

## 2021-06-15 ENCOUNTER — Ambulatory Visit: Payer: Medicare HMO

## 2021-06-16 ENCOUNTER — Other Ambulatory Visit: Payer: Self-pay

## 2021-06-16 ENCOUNTER — Ambulatory Visit
Admission: RE | Admit: 2021-06-16 | Discharge: 2021-06-16 | Disposition: A | Discharge status: Home / Self Care | Payer: Medicare HMO | Source: Ambulatory Visit | Attending: Internal Medicine | Admitting: Internal Medicine

## 2021-06-16 DIAGNOSIS — N6310 Unspecified lump in the right breast, unspecified quadrant: Secondary | ICD-10-CM | POA: Insufficient documentation

## 2021-06-16 DIAGNOSIS — R928 Other abnormal and inconclusive findings on diagnostic imaging of breast: Secondary | ICD-10-CM

## 2021-06-17 ENCOUNTER — Telehealth: Payer: Self-pay | Admitting: Internal Medicine

## 2021-06-17 ENCOUNTER — Encounter: Payer: Self-pay | Admitting: Internal Medicine

## 2021-06-17 DIAGNOSIS — C50919 Malignant neoplasm of unspecified site of unspecified female breast: Secondary | ICD-10-CM | POA: Insufficient documentation

## 2021-06-17 NOTE — Telephone Encounter (Signed)
FYI to PCP   Called pt and son Shanon Brow [phone] re bx right breast cancer bx results form 06/17/21 [place] [facility] called already and will call him Monday to coordinate care for mom I.e oncology and surgery referrals   Advised if he does not hear by midday Monday call our clinic for urgent referrals to oncology [facility], and also Dr. Zachery Dauer [facility] surgery who is now doing total and partial mastectomy and [facility] is referring to him   Dr. Olivia Mackie McLean-Scocuzza

## 2021-06-20 ENCOUNTER — Telehealth: Payer: Self-pay

## 2021-06-20 NOTE — Telephone Encounter (Signed)
Lvm to set up appt in 6-8 weeks with Dr Derrel Nip

## 2021-06-20 NOTE — Telephone Encounter (Signed)
Disc with pt and her son 06/17/21 Dr. Olivia Mackie McLean-Scocuzza He will call me back Monday if not heard from cancer center or surgery so we can place referrals but [facility] set this up for him

## 2021-06-20 NOTE — Telephone Encounter (Signed)
-----   Message from Delorise Jackson, MD sent at 06/17/2021  6:25 PM EDT ----- +breast cancer disc with patient and her son  Referred h/o and surgery   Set up appt with PCP in person in 6-8 weeks in person

## 2021-06-21 ENCOUNTER — Other Ambulatory Visit: Payer: Self-pay

## 2021-06-21 DIAGNOSIS — C50919 Malignant neoplasm of unspecified site of unspecified female breast: Secondary | ICD-10-CM

## 2021-07-11 ENCOUNTER — Inpatient Hospital Stay: Payer: Medicare HMO | Attending: Oncology | Admitting: Oncology

## 2021-07-11 ENCOUNTER — Encounter: Payer: Self-pay | Admitting: Oncology

## 2021-07-11 ENCOUNTER — Inpatient Hospital Stay: Payer: Medicare HMO

## 2021-07-11 ENCOUNTER — Encounter: Payer: Self-pay | Admitting: *Deleted

## 2021-07-11 VITALS — BP 147/75 | HR 91 | Temp 98.4°F | Resp 18 | Wt 183.1 lb

## 2021-07-11 DIAGNOSIS — Z87891 Personal history of nicotine dependence: Secondary | ICD-10-CM | POA: Diagnosis not present

## 2021-07-11 DIAGNOSIS — Z809 Family history of malignant neoplasm, unspecified: Secondary | ICD-10-CM | POA: Insufficient documentation

## 2021-07-11 DIAGNOSIS — E785 Hyperlipidemia, unspecified: Secondary | ICD-10-CM | POA: Insufficient documentation

## 2021-07-11 DIAGNOSIS — Z79899 Other long term (current) drug therapy: Secondary | ICD-10-CM | POA: Diagnosis not present

## 2021-07-11 DIAGNOSIS — Z170 Estrogen receptor positive status [ER+]: Secondary | ICD-10-CM | POA: Diagnosis not present

## 2021-07-11 DIAGNOSIS — Z8261 Family history of arthritis: Secondary | ICD-10-CM | POA: Insufficient documentation

## 2021-07-11 DIAGNOSIS — Z981 Arthrodesis status: Secondary | ICD-10-CM | POA: Insufficient documentation

## 2021-07-11 DIAGNOSIS — Z9049 Acquired absence of other specified parts of digestive tract: Secondary | ICD-10-CM | POA: Diagnosis not present

## 2021-07-11 DIAGNOSIS — Z8349 Family history of other endocrine, nutritional and metabolic diseases: Secondary | ICD-10-CM | POA: Insufficient documentation

## 2021-07-11 DIAGNOSIS — Z85820 Personal history of malignant melanoma of skin: Secondary | ICD-10-CM | POA: Insufficient documentation

## 2021-07-11 DIAGNOSIS — I129 Hypertensive chronic kidney disease with stage 1 through stage 4 chronic kidney disease, or unspecified chronic kidney disease: Secondary | ICD-10-CM | POA: Insufficient documentation

## 2021-07-11 DIAGNOSIS — Z8249 Family history of ischemic heart disease and other diseases of the circulatory system: Secondary | ICD-10-CM | POA: Insufficient documentation

## 2021-07-11 DIAGNOSIS — N183 Chronic kidney disease, stage 3 unspecified: Secondary | ICD-10-CM | POA: Diagnosis not present

## 2021-07-11 DIAGNOSIS — C50411 Malignant neoplasm of upper-outer quadrant of right female breast: Secondary | ICD-10-CM | POA: Insufficient documentation

## 2021-07-11 DIAGNOSIS — E875 Hyperkalemia: Secondary | ICD-10-CM | POA: Insufficient documentation

## 2021-07-11 DIAGNOSIS — Z823 Family history of stroke: Secondary | ICD-10-CM | POA: Insufficient documentation

## 2021-07-11 DIAGNOSIS — Z7189 Other specified counseling: Secondary | ICD-10-CM

## 2021-07-11 DIAGNOSIS — C50919 Malignant neoplasm of unspecified site of unspecified female breast: Secondary | ICD-10-CM

## 2021-07-11 LAB — SURGICAL PATHOLOGY

## 2021-07-11 NOTE — Progress Notes (Signed)
Patient's son called and wants to know if her Her2 results are back.  Results= are scanned and and positive.  Message sent to Dr. Janese Banks to see if there is any additional information she wants me to give him.  Dr. Janese Banks said she would give him a  call.

## 2021-07-11 NOTE — Progress Notes (Signed)
Hematology/Oncology Consult note [facility] Telephone:([phone] [phone]  Patient Care Team: [name], MD as PCP - General (Internal Medicine) Theodore Demark, RN as Oncology Nurse Navigator   Name of the patient: Pamela Paul  [id number]  02-14-1926    Reason for referral-new diagnosis of breast cancer   Referring physician-Dr. Derrel Nip  Date of visit: 07/11/21   History of presenting illness-patient is a 87 year old female with a past medical history significant for hyperlipidemia, CKD stage III hypertension among other medical problems.  She self palpated a breast lump in her right breast which prompted a bilateral diagnostic mammogram Which showed a 3.7 cm mass in the upper outer quadrant of the right breast and was roughly 2.2 cm on ultrasound.  No abnormal adenopathy noted in the right axilla.  This was biopsied and was consistent with invasive mammary carcinoma grade 2 ER greater than 90% positive PR weakly +1 to 10% and HER2 equivocal by IHC.    FISH testing put her in group 4 Number of observers: 2  Number of invasive tumor cells counted: 40  Dual probe assay  Average number of HER2 signals per cell: 4.1  Average number of CEP17 signals per cell: 3.3  HER2/CEP17 ratio: 1.25   FINAL HER2 negative  Patient lives in an independent senior living and is overall doing well for her age.  She denies any specific complaints at this time.  Denies any family history of breast cancer.  Her father died of some cancer but she does not know what type.  ECOG PS- 1  Pain scale- 0   Review of systems- Review of Systems  Constitutional:  Negative for chills, fever, malaise/fatigue and weight loss.  HENT:  Negative for congestion, ear discharge and nosebleeds.   Eyes:  Negative for blurred vision.  Respiratory:  Negative for cough, hemoptysis, sputum production, shortness of breath and wheezing.   Cardiovascular:  Negative for chest pain,  palpitations, orthopnea and claudication.  Gastrointestinal:  Negative for abdominal pain, blood in stool, constipation, diarrhea, heartburn, melena, nausea and vomiting.  Genitourinary:  Negative for dysuria, flank pain, frequency, hematuria and urgency.  Musculoskeletal:  Negative for back pain, joint pain and myalgias.  Skin:  Negative for rash.  Neurological:  Negative for dizziness, tingling, focal weakness, seizures, weakness and headaches.  Endo/Heme/Allergies:  Does not bruise/bleed easily.  Psychiatric/Behavioral:  Negative for depression and suicidal ideas. The patient does not have insomnia.    No Known Allergies  Patient Active Problem List   Diagnosis Date Noted   Breast cancer ([place]) 06/17/2021   Hyperkalemia 01/11/2021   Dysphagia 09/27/2020   Pancreatic cyst 02/02/2020   Secondary hyperparathyroidism of renal origin ([place]) 10/10/2019   Hypercalcemia 10/10/2019   Benign hypertensive kidney disease with chronic kidney disease 10/10/2019   Uses walker 05/09/2019   Acute blood loss as cause of postoperative anemia 02/02/2019   Macular degeneration 07/31/2018   Pain involving joint of finger of right hand 01/16/2018   Widowed 01/14/2017   Vitamin D deficiency 01/14/2017   Grief 01/14/2017   Proximal humeral fracture 10/11/2016   Advanced directives, counseling/discussion 07/30/2015   Renal mass, left 06/28/2015   Other specified disorders of kidney and ureter 06/28/2015   CKD (chronic kidney disease) stage 3, GFR 30-59 ml/min ([place]) 04/20/2014   S/P lumbar fusion 01/05/2014   Low back pain 01/05/2014   Medicare annual wellness visit, subsequent 03/17/2013   Breast cancer screening 03/17/2012   Screening for colon cancer 03/15/2012  Hyperlipidemia    Heart disease    Acquired spondylolisthesis 12/29/2011   Spinal stenosis of lumbar region    Hypothyroidism    Hypertension    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   Degenerative drusen 03/21/2011   Localized  osteoarthrosis, lower leg 01/14/2003     Past Medical History:  Diagnosis Date   CKD (chronic kidney disease) stage 3, GFR 30-59 ml/min (HCC)    Heart disease    mitral valvular   Hyperlipidemia    Hypertension    Hypothyroidism    Spinal stenosis of lumbar region 2006   s/p surgery     Past Surgical History:  Procedure Laterality Date   ABDOMINAL HYSTERECTOMY     bilateral cataract surgery     CHOLECYSTECTOMY  1980   JOINT REPLACEMENT     Bilateral knee   LAPAROSCOPIC SIGMOID COLECTOMY  2011   secondary to benign mass   REPLACEMENT TOTAL KNEE BILATERAL     SMALL INTESTINE SURGERY  2010   for SBO   SPINE SURGERY  2006   [facility], [facility]   tendon repair     achille heall, left     Social History   Socioeconomic History   Marital status: Widowed    Spouse name: Not on file   Number of children: Not on file   Years of education: Not on file   Highest education level: Not on file  Occupational History   Not on file  Tobacco Use   Smoking status: Former    Types: Cigarettes    Quit date: 03/15/1961    Years since quitting: 60.3   Smokeless tobacco: Never   Tobacco comments:    Quit many years ago.  Vaping Use   Vaping Use: Never used  Substance and Sexual Activity   Alcohol use: Not Currently   Drug use: No   Sexual activity: Never  Other Topics Concern   Not on file  Social History Narrative   [place]   Social Determinants of Health   Financial Resource Strain: Low Risk    Difficulty of Paying Living Expenses: Not hard at all  Food Insecurity: No Food Insecurity   Worried About Charity fundraiser in the Last Year: Never true   Arboriculturist in the Last Year: Never true  Transportation Needs: No Transportation Needs   Lack of Transportation (Medical): No   Lack of Transportation (Non-Medical): No  Physical Activity: Sufficiently Active   Days of Exercise per Week: 5 days   Minutes of Exercise per Session: 60 min  Stress: No Stress  Concern Present   Feeling of Stress : Not at all  Social Connections: Unknown   Frequency of Communication with Friends and Family: More than three times a week   Frequency of Social Gatherings with Friends and Family: Twice a week   Attends Religious Services: Not on Electrical engineer or Organizations: Yes   Attends Archivist Meetings: Not on file   Marital Status: Widowed  Human resources officer Violence: Not At Risk   Fear of Current or Ex-Partner: No   Emotionally Abused: No   Physically Abused: No   Sexually Abused: No     Family History  Problem Relation Age of Onset   Arthritis Mother    Stroke Mother    Hyperlipidemia Father    Cancer Father    Heart disease Paternal Aunt      Current Outpatient Medications:  Ascorbic Acid (VITAMIN C) 1000 MG tablet, Take 1,000 mg by mouth daily., Disp: , Rfl:    cholecalciferol (VITAMIN D) 1000 units tablet, Take 1,000 Units by mouth daily., Disp: , Rfl:    levothyroxine (SYNTHROID) 50 MCG tablet, TAKE 1 TABLET BY MOUTH  DAILY, Disp: 90 tablet, Rfl: 3   Multiple Vitamins-Minerals (PRESERVISION AREDS PO), Take 1 capsule by mouth 2 (two) times daily., Disp: , Rfl:    telmisartan (MICARDIS) 20 MG tablet, TAKE 1 TABLET BY MOUTH DAILY, Disp: 90 tablet, Rfl: 3   Turmeric 400 MG CAPS, Take by mouth., Disp: , Rfl:    Physical exam:  Vitals:   07/11/21 1102  BP: (!) 147/75  Pulse: 91  Resp: 18  Temp: 98.4 F (36.9 C)  TempSrc: Tympanic  SpO2: 95%  Weight: 183 lb 1.6 oz (83.1 kg)   Physical Exam Constitutional:      General: She is not in acute distress. Cardiovascular:     Rate and Rhythm: Normal rate and regular rhythm.     Heart sounds: Normal heart sounds.  Pulmonary:     Effort: Pulmonary effort is normal.     Breath sounds: Normal breath sounds.  Abdominal:     General: Bowel sounds are normal.     Palpations: Abdomen is soft.  Skin:    General: Skin is warm and dry.  Neurological:     Mental  Status: She is alert and oriented to person, place, and time.    Breast exam: Palpable 3 cm mass in the upper outer quadrant of the right breast 10 o'clock position.  No palpable right axillary adenopathy.  No palpable masses in the left breast.  No palpable left axillary adenopathy.   CMP Latest Ref Rng & Units 03/08/2020  Glucose 70 - 99 mg/dL -  BUN 4 - 21 27(A)  Creatinine 0.5 - 1.1 1.5(A)  Sodium 137 - 147 141  Potassium 3.4 - 5.3 4.1  Chloride 99 - 108 104  CO2 13 - 22 27(A)  Calcium 8.4 - 10.5 mg/dL -  Total Protein 6.0 - 8.3 g/dL -  Total Bilirubin 0.2 - 1.2 mg/dL -  Alkaline Phos 25 - 125 64  AST 13 - 35 16  ALT 7 - 35 11   CBC Latest Ref Rng & Units 03/08/2020  WBC - 6.9  Hemoglobin 12.0 - 16.0 13.9  Hematocrit 36 - 46 38  Platelets 150 - 399 287    No images are attached to the encounter.  MM CLIP PLACEMENT RIGHT  Result Date: 06/16/2021 CLINICAL DATA:  Evaluate VENUS biopsy clip placement following ultrasound-guided RIGHT breast biopsy. EXAM: 3D DIAGNOSTIC RIGHT MAMMOGRAM POST ULTRASOUND BIOPSY COMPARISON:  Previous exam(s). FINDINGS: 3D Mammographic images were obtained following ultrasound guided biopsy of the irregular mass at the 10 o'clock position of the RIGHT breast. The VENUS biopsy marking clip is in expected position at the site of biopsy. IMPRESSION: Appropriate positioning of the VENUS shaped biopsy marking clip at the site of biopsy in the UPPER OUTER RIGHT breast. Final Assessment: Post Procedure Mammograms for Marker Placement Electronically Signed   By: Margarette Canada M.D.   On: 06/16/2021 08:53  [place] RT BREAST BX W LOC DEV 1ST LESION IMG BX SPEC US GUIDE  Addendum Date: 06/17/2021   ADDENDUM REPORT: 06/17/2021 15:02 ADDENDUM: PATHOLOGY revealed: A. RIGHT BREAST, 10:00 o'clock; 7 CMFN; ULTRASOUND-GUIDED BIOPSY: - INVASIVE MAMMARY CARCINOMA, SEE COMMENT. Size of invasive carcinoma: 12 mm. Grade 2. Ductal carcinoma in situ: Not identified.  Lymphovascular invasion:  Not identified. Comment: The tumor demonstrates mixed morphologies, including areas with lobular features and areas with possible ductal differentiation as well as a background of myxoid / mucinous material. NOTE: Mammographic dimension of biopsied mass is more accurate than the ultrasound measurement. Pathology results are CONCORDANT with imaging findings, per Dr. Hassan Rowan. Pathology results and recommendations were discussed with patient and son Waunita Schooner) via telephone on 06/17/2021. Patient reported doing well after the biopsy with no adverse symptoms, and only slight tenderness at the site. Post biopsy care instructions were reviewed, questions were answered and my direct phone number was provided. Patient was instructed to call [facility] for any additional questions or concerns related to biopsy site. Recommend surgical consultation: Request for surgical consultation relayed to Al Pimple RN and Tanya Nones RN at [facility] by Electa Sniff RN on 06/17/2021. Pathology results reported by Electa Sniff RN on 06/17/2021. Electronically Signed   By: Margarette Canada M.D.   On: 06/17/2021 15:02   Result Date: 06/17/2021 CLINICAL DATA:  87 year old female for tissue sampling of 2.2 cm UPPER-OUTER RIGHT breast mass. EXAM: ULTRASOUND GUIDED RIGHT BREAST CORE NEEDLE BIOPSY COMPARISON:  Previous exam(s). PROCEDURE: I met with the patient and we discussed the procedure of ultrasound-guided biopsy, including benefits and alternatives. We discussed the high likelihood of a successful procedure. We discussed the risks of the procedure, including infection, bleeding, tissue injury, clip migration, and inadequate sampling. Informed written consent was given. The usual time-out protocol was performed immediately prior to the procedure. Lesion quadrant: UPPER-OUTER RIGHT breast Using sterile technique and 1% Lidocaine as local anesthetic, under direct ultrasound visualization, a 12 gauge spring-loaded  device was used to perform biopsy of the 2.2 cm mass at the 10 o'clock position of the RIGHT breast using a LATERAL approach. At the conclusion of the procedure a VENUS tissue marker clip was deployed into the biopsy cavity. Follow up 2 view mammogram was performed and dictated separately. IMPRESSION: Ultrasound guided biopsy of UPPER-OUTER RIGHT breast mass. No apparent complications. Electronically Signed: By: Margarette Canada M.D. On: 06/16/2021 08:39   Assessment and plan- Patient is a 87 y.o. female with newly diagnosed invasive mammary carcinoma of the right breast T2 N0 M0 ER positive greater than 90%, PR weakly +1 to 10% and HER2 equivocal by IHC and group for FISH likely negative  HER2 FISH testing was not back at the time of my visit with the patient.  I did call patient's son Shanon Brow at the end of the day when the results came back.  Patient has a roughly 3.7 cm tumor in her right breast that was invasive mammary carcinoma grade 2 ER strongly positive, PR weakly positive.  HER2 is likely negative based on her biopsy but she falls in group 4 category by [facility] and therefore she needs repeat HER2 testing to confirm if she is truly HER2 positive or negative.  Ideally this should be done on her final pathology specimen.  Regardless if she is HER2 positive or negative given her age I would recommend upfront surgery instead of any neoadjuvant treatment.  Discussed that typical surgical treatment would be lumpectomy followed by radiation treatment versus mastectomy.  Patient is hesitant to take 5 weeks of radiation which will need to be factored in the decision between lumpectomy and mastectomy.  Moreover if her final pathology shows HER2 positive disease, it is unclear if patient would be willing to accept HER2 based treatments.  Certainly weekly Taxol along with  Herceptin for 12 weeks followed by Herceptin maintenance even at her age is an option if her baseline cardiac functions are normal.  However if patient  decides not to take any HER2 based treatments as well as says no radiation then mastectomy may be a better option at least to prevent local recurrence if not distant recurrence.  She does have ER positive disease and there would be a role for tamoxifen versus AI down the line after surgery to reduce her chances of recurrence.  I will discuss this in more detail after final pathology is back.  Did briefly discuss hot flashes, arthralgias and worsening bone health is a possible side effect of AI's.  We will obtain a baseline bone density scan at this time as well.  Adjuvant treatment decisions would be made after upfront surgery.  Patient is seeing Dr. Peyton Najjar tomorrow and I have updated him of the care plan as well   Total face to face encounter time for this patient visit was 40 min.  Total non face to face encounter time for this patient on the day of the visit was 10 min. Time spent in reviewing outside records as well as discussing patients case with Dr. Peyton Najjar    Thank you for this kind referral and the opportunity to participate in the care of this patient   Visit Diagnosis 1. Invasive carcinoma of breast ([place])   2. Goals of care, counseling/discussion     Dr. Randa Evens, MD, MPH [facility] at [facility] [phone] 07/11/2021

## 2021-07-12 ENCOUNTER — Ambulatory Visit: Payer: Self-pay | Admitting: General Surgery

## 2021-07-12 NOTE — H&P (View-Only) (Signed)
PATIENT PROFILE: Nychelle Cassata is a 87 y.o. female who presents to the Clinic for consultation at the request of Dr. Terese Door for evaluation of right breast cancer.  PCP:  Jannifer Hick, MD  HISTORY OF PRESENT ILLNESS: [name] reports she palpated a knot in the lateral aspect of the right breast.  This led to diagnostic mammogram and ultrasound.  Diagnostic mammogram and ultrasound showed a 2.2 cm mass of the right breast.  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  The mass is estrogen receptor positive progesterone receptor positive and equivocal HER2/neu.  Family history of breast cancer: None Family history of other cancers: Father with metastatic cancer of unknown primary Menarche: 87 years old Menopause: 87 years old Used OCP: Denies Used estrogen and progesterone therapy: Denies History of Radiation to the chest: None Number of pregnancies: 5 Age of first pregnancy: 88 Previous breast biopsy: None  PROBLEM LIST:   GENERAL REVIEW OF SYSTEMS:   General ROS: negative for - chills, fatigue, fever, weight gain or weight loss Allergy and Immunology ROS: negative for - hives  Hematological and Lymphatic ROS: negative for - bleeding problems or bruising, negative for palpable nodes Endocrine ROS: negative for - heat or cold intolerance, hair changes Respiratory ROS: negative for - cough, shortness of breath or wheezing Cardiovascular ROS: no chest pain or palpitations GI ROS: negative for nausea, vomiting, abdominal pain, diarrhea, constipation Musculoskeletal ROS: negative for - joint swelling or muscle pain Neurological ROS: negative for - confusion, syncope Dermatological ROS: negative for pruritus and rash Psychiatric: negative for anxiety, depression, difficulty sleeping and memory loss  MEDICATIONS: Current Outpatient Medications  Medication Sig Dispense Refill   ascorbic acid, vitamin C, (VITAMIN C) 1000 MG tablet Take  1,000 mg by mouth once daily     cholecalciferol (VITAMIN D3) 1000 unit capsule Take 1,000 Units by mouth once daily     levothyroxine (SYNTHROID) 50 MCG tablet Take 50 mcg by mouth once daily Take on an empty stomach with a glass of water at least 30-60 minutes before breakfast.     multivitamin (MULTIPLE VITAMINS DAILY ORAL) Take by mouth     telmisartan (MICARDIS) 20 MG tablet Take 20 mg by mouth once daily     TUMERIC-GING-OLIVE-OREG-CAPRYL ORAL Take by mouth     No current facility-administered medications for this visit.    ALLERGIES: Patient has no allergy information on record.  PAST MEDICAL HISTORY: No past medical history on file.  PAST SURGICAL HISTORY: No past surgical history on file.   FAMILY HISTORY: No family history on file.   SOCIAL HISTORY: Social History   Socioeconomic History   Marital status: Widowed    PHYSICAL EXAM: Vitals:   07/12/21 0759  BP: (!) 140/76  Pulse: 86   Body mass index is 30.25 kg/m. Weight: 85 kg (187 lb 6.4 oz)   GENERAL: Alert, active, oriented x3  HEENT: Pupils equal reactive to light. Extraocular movements are intact. Sclera clear. Palpebral conjunctiva normal red color.Pharynx clear.  NECK: Supple with no palpable mass and no adenopathy.  LUNGS: Sound clear with no rales rhonchi or wheezes.  HEART: Regular rhythm S1 and S2 without murmur.  BREAST: breasts appear normal, no suspicious masses, no skin or nipple changes or axillary nodes.  ABDOMEN: Soft and depressible, nontender with no palpable mass, no hepatomegaly.  EXTREMITIES: Well-developed well-nourished symmetrical with no dependent edema.  NEUROLOGICAL: Awake alert oriented, facial expression symmetrical, moving all extremities.  REVIEW OF DATA: I  have reviewed the following data today: No visits with results within 3 Month(s) from this visit.  Latest known visit with results is:  No results found for any previous visit.  I personally evaluated the  mammogram and ultrasound of the breast.  I personally evaluated the pathology report  ASSESSMENT: [name] is a 87 y.o. female presenting for consultation for right breast cancer.    Patient was oriented again about the pathology results. Surgical alternatives were discussed with patient including partial vs total mastectomy. Surgical technique and post operative care was discussed with patient. Risk of surgery was discussed with patient including but not limited to: wound infection, seroma, hematoma, brachial plexopathy, mondor's disease (thrombosis of small veins of breast), chronic wound pain, breast lymphedema, altered sensation to the nipple and cosmesis among others.   I discussed the case with oncology.  As per oncology we will treat this cancer as HER2 negative.  We will retest the final specimen for HER2/neu since it was likely local.  After long discussion about the pros and cons of partial and total mastectomy patient has decided to proceed with partial mastectomy and sentinel node biopsy.  Malignant neoplasm of upper-outer quadrant of right breast in female, estrogen receptor positive (CMS-HCC) [C50.411, Z17.0]  PLAN: 1.  Right breast radiofrequency tag partial mastectomy with sentinel needle biopsy (19301, 38525) 2.  Avoid taking aspirin 5 days before the surgery 3.  Contact us if you have any concern.  Patient and her son verbalized understanding, all questions were answered, and were agreeable with the plan outlined above.   Herbert Pun, MD  Electronically signed by Herbert Pun, MD

## 2021-07-12 NOTE — H&P (Signed)
PATIENT PROFILE: Pamela Paul is a 87 y.o. female who presents to the Clinic for consultation at the request of Dr. Terese Door for evaluation of right breast cancer.  PCP:  Jannifer Hick, MD  HISTORY OF PRESENT ILLNESS: [name] reports she palpated a knot in the lateral aspect of the right breast.  This led to diagnostic mammogram and ultrasound.  Diagnostic mammogram and ultrasound showed a 2.2 cm mass of the right breast.  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  The mass is estrogen receptor positive progesterone receptor positive and equivocal HER2/neu.  Family history of breast cancer: None Family history of other cancers: Father with metastatic cancer of unknown primary Menarche: 87 years old Menopause: 87 years old Used OCP: Denies Used estrogen and progesterone therapy: Denies History of Radiation to the chest: None Number of pregnancies: 5 Age of first pregnancy: 88 Previous breast biopsy: None  PROBLEM LIST:   GENERAL REVIEW OF SYSTEMS:   General ROS: negative for - chills, fatigue, fever, weight gain or weight loss Allergy and Immunology ROS: negative for - hives  Hematological and Lymphatic ROS: negative for - bleeding problems or bruising, negative for palpable nodes Endocrine ROS: negative for - heat or cold intolerance, hair changes Respiratory ROS: negative for - cough, shortness of breath or wheezing Cardiovascular ROS: no chest pain or palpitations GI ROS: negative for nausea, vomiting, abdominal pain, diarrhea, constipation Musculoskeletal ROS: negative for - joint swelling or muscle pain Neurological ROS: negative for - confusion, syncope Dermatological ROS: negative for pruritus and rash Psychiatric: negative for anxiety, depression, difficulty sleeping and memory loss  MEDICATIONS: Current Outpatient Medications  Medication Sig Dispense Refill   ascorbic acid, vitamin C, (VITAMIN C) 1000 MG tablet Take  1,000 mg by mouth once daily     cholecalciferol (VITAMIN D3) 1000 unit capsule Take 1,000 Units by mouth once daily     levothyroxine (SYNTHROID) 50 MCG tablet Take 50 mcg by mouth once daily Take on an empty stomach with a glass of water at least 30-60 minutes before breakfast.     multivitamin (MULTIPLE VITAMINS DAILY ORAL) Take by mouth     telmisartan (MICARDIS) 20 MG tablet Take 20 mg by mouth once daily     TUMERIC-GING-OLIVE-OREG-CAPRYL ORAL Take by mouth     No current facility-administered medications for this visit.    ALLERGIES: Patient has no allergy information on record.  PAST MEDICAL HISTORY: No past medical history on file.  PAST SURGICAL HISTORY: No past surgical history on file.   FAMILY HISTORY: No family history on file.   SOCIAL HISTORY: Social History   Socioeconomic History   Marital status: Widowed    PHYSICAL EXAM: Vitals:   07/12/21 0759  BP: (!) 140/76  Pulse: 86   Body mass index is 30.25 kg/m. Weight: 85 kg (187 lb 6.4 oz)   GENERAL: Alert, active, oriented x3  HEENT: Pupils equal reactive to light. Extraocular movements are intact. Sclera clear. Palpebral conjunctiva normal red color.Pharynx clear.  NECK: Supple with no palpable mass and no adenopathy.  LUNGS: Sound clear with no rales rhonchi or wheezes.  HEART: Regular rhythm S1 and S2 without murmur.  BREAST: breasts appear normal, no suspicious masses, no skin or nipple changes or axillary nodes.  ABDOMEN: Soft and depressible, nontender with no palpable mass, no hepatomegaly.  EXTREMITIES: Well-developed well-nourished symmetrical with no dependent edema.  NEUROLOGICAL: Awake alert oriented, facial expression symmetrical, moving all extremities.  REVIEW OF DATA: I  have reviewed the following data today: No visits with results within 3 Month(s) from this visit.  Latest known visit with results is:  No results found for any previous visit.  I personally evaluated the  mammogram and ultrasound of the breast.  I personally evaluated the pathology report  ASSESSMENT: [name] is a 87 y.o. female presenting for consultation for right breast cancer.    Patient was oriented again about the pathology results. Surgical alternatives were discussed with patient including partial vs total mastectomy. Surgical technique and post operative care was discussed with patient. Risk of surgery was discussed with patient including but not limited to: wound infection, seroma, hematoma, brachial plexopathy, mondor's disease (thrombosis of small veins of breast), chronic wound pain, breast lymphedema, altered sensation to the nipple and cosmesis among others.   I discussed the case with oncology.  As per oncology we will treat this cancer as HER2 negative.  We will retest the final specimen for HER2/neu since it was likely local.  After long discussion about the pros and cons of partial and total mastectomy patient has decided to proceed with partial mastectomy and sentinel node biopsy.  Malignant neoplasm of upper-outer quadrant of right breast in female, estrogen receptor positive (CMS-HCC) [C50.411, Z17.0]  PLAN: 1.  Right breast radiofrequency tag partial mastectomy with sentinel needle biopsy (19301, 38525) 2.  Avoid taking aspirin 5 days before the surgery 3.  Contact us if you have any concern.  Patient and her son verbalized understanding, all questions were answered, and were agreeable with the plan outlined above.   Herbert Pun, MD  Electronically signed by Herbert Pun, MD

## 2021-07-13 ENCOUNTER — Other Ambulatory Visit: Payer: Self-pay | Admitting: General Surgery

## 2021-07-13 DIAGNOSIS — C50411 Malignant neoplasm of upper-outer quadrant of right female breast: Secondary | ICD-10-CM

## 2021-07-15 ENCOUNTER — Other Ambulatory Visit: Payer: Self-pay

## 2021-07-15 ENCOUNTER — Encounter
Admission: RE | Admit: 2021-07-15 | Discharge: 2021-07-15 | Disposition: A | Discharge status: Home / Self Care | Payer: Medicare HMO | Source: Ambulatory Visit | Attending: General Surgery | Admitting: General Surgery

## 2021-07-15 NOTE — Patient Instructions (Addendum)
Your procedure is scheduled on: July 22, 2021 FRIDAY Report to the Registration Desk on the 1st floor of the [facility] AT 7:45 AM  REMEMBER: Instructions that are not followed completely may result in serious medical risk, up to and including death; or upon the discretion of your surgeon and anesthesiologist your surgery may need to be rescheduled.  DO NOT EAT OR DRINK after midnight the night before surgery.  No gum chewing, lozengers or hard candies.  TAKE THESE MEDICATIONS THE MORNING OF SURGERY WITH A SIP OF WATER: LEVOTHYROXINE   One week prior to surgery: Stop Anti-inflammatories (NSAIDS) such as Advil, Aleve, Ibuprofen, Motrin, Naproxen, Naprosyn and ASPIRIN OR Aspirin based products such as Excedrin, Goodys Powder, BC Powder. Stop ANY OVER THE COUNTER supplements until after surgery. You may however, continue to take Tylenol if needed for pain up until the day of surgery.  No Alcohol for 24 hours before or after surgery.  No Smoking including e-cigarettes for 24 hours prior to surgery.  No chewable tobacco products for at least 6 hours prior to surgery.  No nicotine patches on the day of surgery.  Do not use any "recreational" drugs for at least a week prior to your surgery.  Please be advised that the combination of cocaine and anesthesia may have negative outcomes, up to and including death. If you test positive for cocaine, your surgery will be cancelled.  On the morning of surgery brush your teeth with toothpaste and water, you may rinse your mouth with mouthwash if you wish. Do not swallow any toothpaste or mouthwash.  Do not wear jewelry, make-up, hairpins, clips or nail polish.  Do not wear lotions, powders, or perfumes OR DEODORANT   Do not shave body from the neck down 48 hours prior to surgery just in case you cut yourself which could leave a site for infection.  Also, freshly shaved skin may become irritated if using the CHG soap.  Contact lenses, hearing  aids and dentures may not be worn into surgery.  Do not bring valuables to the hospital. [facility] is not responsible for any missing/lost belongings or valuables.   Use CHG Soap as directed on instruction sheet.  Notify your doctor if there is any change in your medical condition (cold, fever, infection).  Wear comfortable clothing (specific to your surgery type) to the hospital.  After surgery, you can help prevent lung complications by doing breathing exercises.  Take deep breaths and cough every 1-2 hours. Your doctor may order a device called an Incentive Spirometer to help you take deep breaths. When coughing or sneezing, hold a pillow firmly against your incision with both hands. This is called "splinting." Doing this helps protect your incision. It also decreases belly discomfort.  If you are being discharged the day of surgery, you will not be allowed to drive home. You will need a responsible adult (18 years or older) to drive you home and stay with you that night.   If you are taking public transportation, you will need to have a responsible adult (18 years or older) with you. Please confirm with your physician that it is acceptable to use public transportation.   Please call the [facility] at [phone] if you have any questions about these instructions.  Surgery Visitation Policy:  Patients undergoing a surgery or procedure may have one family member or support person with them as long as that person is not COVID-19 positive or experiencing its symptoms.  That person  may remain in the waiting area during the procedure.

## 2021-07-19 ENCOUNTER — Ambulatory Visit
Admission: RE | Admit: 2021-07-19 | Discharge: 2021-07-19 | Disposition: A | Discharge status: Home / Self Care | Payer: Medicare HMO | Source: Ambulatory Visit | Attending: General Surgery | Admitting: General Surgery

## 2021-07-19 ENCOUNTER — Other Ambulatory Visit: Payer: Self-pay | Admitting: General Surgery

## 2021-07-19 ENCOUNTER — Other Ambulatory Visit: Payer: Self-pay

## 2021-07-19 ENCOUNTER — Other Ambulatory Visit
Admission: RE | Admit: 2021-07-19 | Discharge: 2021-07-19 | Disposition: A | Discharge status: Home / Self Care | Payer: Medicare HMO | Source: Ambulatory Visit | Attending: General Surgery | Admitting: General Surgery

## 2021-07-19 DIAGNOSIS — Z170 Estrogen receptor positive status [ER+]: Secondary | ICD-10-CM

## 2021-07-19 DIAGNOSIS — C50411 Malignant neoplasm of upper-outer quadrant of right female breast: Secondary | ICD-10-CM

## 2021-07-19 LAB — CBC
HCT: 36.9 % (ref 36.0–46.0)
Hemoglobin: 12.9 g/dL (ref 12.0–15.0)
MCH: 32.7 pg (ref 26.0–34.0)
MCHC: 35 g/dL (ref 30.0–36.0)
MCV: 93.4 fL (ref 80.0–100.0)
Platelets: 271 10*3/uL (ref 150–400)
RBC: 3.95 MIL/uL (ref 3.87–5.11)
RDW: 11.9 % (ref 11.5–15.5)
WBC: 8.3 10*3/uL (ref 4.0–10.5)
nRBC: 0 % (ref 0.0–0.2)

## 2021-07-19 LAB — BASIC METABOLIC PANEL
Anion gap: 13 (ref 5–15)
BUN: 27 mg/dL — ABNORMAL HIGH (ref 8–23)
CO2: 23 mmol/L (ref 22–32)
Calcium: 9.1 mg/dL (ref 8.9–10.3)
Chloride: 100 mmol/L (ref 98–111)
Creatinine, Ser: 1.37 mg/dL — ABNORMAL HIGH (ref 0.44–1.00)
GFR, Estimated: 36 mL/min — ABNORMAL LOW (ref >60)
Glucose, Bld: 129 mg/dL — ABNORMAL HIGH (ref 70–99)
Potassium: 4.1 mmol/L (ref 3.5–5.1)
Sodium: 136 mmol/L (ref 135–145)

## 2021-07-20 ENCOUNTER — Ambulatory Visit: Payer: Self-pay | Admitting: General Surgery

## 2021-07-21 MED ORDER — CEFAZOLIN SODIUM-DEXTROSE 2-4 GM/100ML-% IV SOLN
2.0000 g | INTRAVENOUS | Status: AC
  Administered 2021-07-22: 2 g via INTRAVENOUS

## 2021-07-22 ENCOUNTER — Encounter: Payer: Self-pay | Admitting: General Surgery

## 2021-07-22 ENCOUNTER — Ambulatory Visit
Admission: RE | Admit: 2021-07-22 | Discharge: 2021-07-22 | Disposition: A | Discharge status: Home / Self Care | Payer: Medicare HMO | Source: Ambulatory Visit | Attending: General Surgery | Admitting: General Surgery

## 2021-07-22 ENCOUNTER — Encounter
Admission: RE | Disposition: A | Discharge status: Home / Self Care | Payer: Self-pay | Source: Home / Self Care | Attending: General Surgery

## 2021-07-22 ENCOUNTER — Ambulatory Visit: Payer: Medicare HMO | Admitting: Certified Registered Nurse Anesthetist

## 2021-07-22 ENCOUNTER — Ambulatory Visit
Admission: RE | Admit: 2021-07-22 | Discharge: 2021-07-22 | Disposition: A | Discharge status: Home / Self Care | Payer: Medicare HMO | Attending: General Surgery | Admitting: General Surgery

## 2021-07-22 ENCOUNTER — Other Ambulatory Visit: Payer: Self-pay

## 2021-07-22 DIAGNOSIS — C50411 Malignant neoplasm of upper-outer quadrant of right female breast: Secondary | ICD-10-CM | POA: Insufficient documentation

## 2021-07-22 DIAGNOSIS — E039 Hypothyroidism, unspecified: Secondary | ICD-10-CM | POA: Diagnosis not present

## 2021-07-22 DIAGNOSIS — D631 Anemia in chronic kidney disease: Secondary | ICD-10-CM | POA: Diagnosis not present

## 2021-07-22 DIAGNOSIS — Z87891 Personal history of nicotine dependence: Secondary | ICD-10-CM | POA: Insufficient documentation

## 2021-07-22 DIAGNOSIS — Z79890 Hormone replacement therapy (postmenopausal): Secondary | ICD-10-CM | POA: Diagnosis not present

## 2021-07-22 DIAGNOSIS — Z170 Estrogen receptor positive status [ER+]: Secondary | ICD-10-CM | POA: Diagnosis not present

## 2021-07-22 DIAGNOSIS — Z171 Estrogen receptor negative status [ER-]: Secondary | ICD-10-CM

## 2021-07-22 DIAGNOSIS — I129 Hypertensive chronic kidney disease with stage 1 through stage 4 chronic kidney disease, or unspecified chronic kidney disease: Secondary | ICD-10-CM | POA: Diagnosis not present

## 2021-07-22 DIAGNOSIS — N183 Chronic kidney disease, stage 3 unspecified: Secondary | ICD-10-CM | POA: Diagnosis not present

## 2021-07-22 DIAGNOSIS — Z79899 Other long term (current) drug therapy: Secondary | ICD-10-CM | POA: Insufficient documentation

## 2021-07-22 HISTORY — PX: BREAST LUMPECTOMY: SHX2

## 2021-07-22 HISTORY — PX: PART MASTECTOMY,RADIO FREQUENCY LOCALIZER,AXILLARY SENTINEL NODE BIOPSY: SHX6901

## 2021-07-22 HISTORY — PX: MELANOMA EXCISION WITH SENTINEL LYMPH NODE BIOPSY: SHX5267

## 2021-07-22 SURGERY — PART MASTECTOMY,RADIO FREQUENCY LOCALIZER,AXILLARY SENTINEL NODE BIOPSY
Anesthesia: General | Site: Breast | Laterality: Right

## 2021-07-22 MED ORDER — CHLORHEXIDINE GLUCONATE 0.12 % MT SOLN: 15.0000 mL | Freq: Once | OROMUCOSAL | Status: AC

## 2021-07-22 MED ORDER — FAMOTIDINE 20 MG PO TABS: 20.0000 mg | ORAL_TABLET | Freq: Once | ORAL | Status: AC

## 2021-07-22 MED ORDER — FENTANYL CITRATE (PF) 100 MCG/2ML IJ SOLN: 25.0000 ug | INTRAMUSCULAR | Status: DC | PRN

## 2021-07-22 MED ORDER — EPHEDRINE 5 MG/ML INJ
INTRAVENOUS | Status: AC
  Filled 2021-07-22: qty 5

## 2021-07-22 MED ORDER — FENTANYL CITRATE (PF) 100 MCG/2ML IJ SOLN
INTRAMUSCULAR | Status: AC
  Filled 2021-07-22: qty 2

## 2021-07-22 MED ORDER — LIDOCAINE HCL (CARDIAC) PF 100 MG/5ML IV SOSY
PREFILLED_SYRINGE | INTRAVENOUS | Status: DC | PRN
  Administered 2021-07-22: 80 mg via INTRAVENOUS

## 2021-07-22 MED ORDER — ACETAMINOPHEN 10 MG/ML IV SOLN
INTRAVENOUS | Status: DC | PRN
  Administered 2021-07-22: 1000 mg via INTRAVENOUS

## 2021-07-22 MED ORDER — HEMOSTATIC AGENTS (NO CHARGE) OPTIME
TOPICAL | Status: DC | PRN
  Administered 2021-07-22: 1 via TOPICAL

## 2021-07-22 MED ORDER — LACTATED RINGERS IV SOLN: INTRAVENOUS | Status: DC

## 2021-07-22 MED ORDER — OXYCODONE HCL 5 MG PO TABS
5.0000 mg | ORAL_TABLET | Freq: Once | ORAL | Status: AC | PRN
  Administered 2021-07-22: 5 mg via ORAL

## 2021-07-22 MED ORDER — BUPIVACAINE-EPINEPHRINE (PF) 0.5% -1:200000 IJ SOLN
INTRAMUSCULAR | Status: AC
  Filled 2021-07-22: qty 30

## 2021-07-22 MED ORDER — ONDANSETRON HCL 4 MG/2ML IJ SOLN
INTRAMUSCULAR | Status: AC
  Filled 2021-07-22: qty 2

## 2021-07-22 MED ORDER — CEFAZOLIN SODIUM-DEXTROSE 2-4 GM/100ML-% IV SOLN
INTRAVENOUS | Status: AC
  Filled 2021-07-22: qty 100

## 2021-07-22 MED ORDER — METHYLENE BLUE 0.5 % INJ SOLN
INTRAVENOUS | Status: AC
  Filled 2021-07-22: qty 10

## 2021-07-22 MED ORDER — ORAL CARE MOUTH RINSE: 15.0000 mL | Freq: Once | OROMUCOSAL | Status: AC

## 2021-07-22 MED ORDER — FAMOTIDINE 20 MG PO TABS
ORAL_TABLET | ORAL | Status: AC
  Filled 2021-07-22: qty 1

## 2021-07-22 MED ORDER — ONDANSETRON HCL 4 MG/2ML IJ SOLN: 4.0000 mg | Freq: Once | INTRAMUSCULAR | Status: DC | PRN

## 2021-07-22 MED ORDER — PROPOFOL 10 MG/ML IV BOLUS
INTRAVENOUS | Status: DC | PRN
  Administered 2021-07-22: 110 mg via INTRAVENOUS

## 2021-07-22 MED ORDER — TRAMADOL HCL 50 MG PO TABS: 50.0000 mg | ORAL_TABLET | Freq: Four times a day (QID) | ORAL | 0 refills | Status: DC | PRN

## 2021-07-22 MED ORDER — DEXAMETHASONE SODIUM PHOSPHATE 10 MG/ML IJ SOLN
INTRAMUSCULAR | Status: DC | PRN
  Administered 2021-07-22: 5 mg via INTRAVENOUS

## 2021-07-22 MED ORDER — EPHEDRINE SULFATE 50 MG/ML IJ SOLN
INTRAMUSCULAR | Status: DC | PRN
  Administered 2021-07-22 (×4): 5 mg via INTRAVENOUS

## 2021-07-22 MED ORDER — CHLORHEXIDINE GLUCONATE 0.12 % MT SOLN
OROMUCOSAL | Status: AC
  Administered 2021-07-22: 15 mL via OROMUCOSAL
  Filled 2021-07-22: qty 15

## 2021-07-22 MED ORDER — OXYCODONE HCL 5 MG/5ML PO SOLN: 5.0000 mg | Freq: Once | ORAL | Status: AC | PRN

## 2021-07-22 MED ORDER — TECHNETIUM TC 99M TILMANOCEPT KIT
1.0000 | PACK | Freq: Once | INTRAVENOUS | Status: AC | PRN
  Administered 2021-07-22: 1.072 via INTRADERMAL

## 2021-07-22 MED ORDER — OXYCODONE HCL 5 MG PO TABS
ORAL_TABLET | ORAL | Status: AC
  Filled 2021-07-22: qty 1

## 2021-07-22 MED ORDER — PROPOFOL 10 MG/ML IV BOLUS
INTRAVENOUS | Status: AC
  Filled 2021-07-22: qty 20

## 2021-07-22 MED ORDER — BUPIVACAINE-EPINEPHRINE (PF) 0.5% -1:200000 IJ SOLN
INTRAMUSCULAR | Status: DC | PRN
  Administered 2021-07-22: 30 mL via PERINEURAL

## 2021-07-22 MED ORDER — FAMOTIDINE 20 MG PO TABS
ORAL_TABLET | ORAL | Status: AC
  Administered 2021-07-22: 20 mg via ORAL
  Filled 2021-07-22: qty 2

## 2021-07-22 MED ORDER — STERILE WATER FOR IRRIGATION IR SOLN
Status: DC | PRN
  Administered 2021-07-22: 150 mL

## 2021-07-22 MED ORDER — ONDANSETRON HCL 4 MG/2ML IJ SOLN
INTRAMUSCULAR | Status: DC | PRN
  Administered 2021-07-22: 4 mg via INTRAVENOUS

## 2021-07-22 MED ORDER — PHENYLEPHRINE HCL (PRESSORS) 10 MG/ML IV SOLN
INTRAVENOUS | Status: DC | PRN
  Administered 2021-07-22 (×2): 50 ug via INTRAVENOUS
  Administered 2021-07-22: 100 ug via INTRAVENOUS
  Administered 2021-07-22: 50 ug via INTRAVENOUS
  Administered 2021-07-22 (×2): 100 ug via INTRAVENOUS

## 2021-07-22 MED ORDER — FENTANYL CITRATE (PF) 100 MCG/2ML IJ SOLN
INTRAMUSCULAR | Status: DC | PRN
  Administered 2021-07-22 (×4): 25 ug via INTRAVENOUS

## 2021-07-22 MED ORDER — ACETAMINOPHEN 10 MG/ML IV SOLN
INTRAVENOUS | Status: AC
  Filled 2021-07-22: qty 100

## 2021-07-22 SURGICAL SUPPLY — 50 items
ADH SKN CLS APL DERMABOND .7 (GAUZE/BANDAGES/DRESSINGS) ×2
APL PRP STRL LF DISP 70% ISPRP (MISCELLANEOUS) ×2
BINDER BREAST LRG (GAUZE/BANDAGES/DRESSINGS) ×3 IMPLANT
BLADE SURG 15 STRL LF DISP TIS (BLADE) ×4 IMPLANT
BLADE SURG 15 STRL SS (BLADE) ×6
CANISTER SUCT 1200ML W/VALVE (MISCELLANEOUS) IMPLANT
CHLORAPREP W/TINT 26 (MISCELLANEOUS) ×3 IMPLANT
CNTNR SPEC 2.5X3XGRAD LEK (MISCELLANEOUS)
CONT SPEC 4OZ STER OR WHT (MISCELLANEOUS)
CONT SPEC 4OZ STRL OR WHT (MISCELLANEOUS)
CONTAINER SPEC 2.5X3XGRAD LEK (MISCELLANEOUS) IMPLANT
DERMABOND ADVANCED (GAUZE/BANDAGES/DRESSINGS) ×1
DERMABOND ADVANCED .7 DNX12 (GAUZE/BANDAGES/DRESSINGS) ×2 IMPLANT
DEVICE DUBIN SPECIMEN MAMMOGRA (MISCELLANEOUS) ×3 IMPLANT
DRAPE LAPAROTOMY TRNSV 106X77 (MISCELLANEOUS) ×3 IMPLANT
DRSG GAUZE FLUFF 36X18 (GAUZE/BANDAGES/DRESSINGS) IMPLANT
ELECT CAUTERY BLADE 6.4 (BLADE) ×3 IMPLANT
ELECT REM PT RETURN 9FT ADLT (ELECTROSURGICAL) ×3
ELECTRODE REM PT RTRN 9FT ADLT (ELECTROSURGICAL) ×2 IMPLANT
GAUZE 4X4 16PLY ~~LOC~~+RFID DBL (SPONGE) ×3 IMPLANT
GLOVE SURG ENC MOIS LTX SZ6.5 (GLOVE) ×3 IMPLANT
GLOVE SURG UNDER POLY LF SZ6.5 (GLOVE) ×3 IMPLANT
GOWN STRL REUS W/ TWL LRG LVL3 (GOWN DISPOSABLE) ×4 IMPLANT
GOWN STRL REUS W/TWL LRG LVL3 (GOWN DISPOSABLE) ×6
HEMOSTAT ARISTA ABSORB 3G PWDR (HEMOSTASIS) ×3 IMPLANT
KIT MARKER MARGIN INK (KITS) ×3 IMPLANT
KIT TURNOVER KIT A (KITS) ×3 IMPLANT
LABEL OR SOLS (LABEL) ×3 IMPLANT
MANIFOLD NEPTUNE II (INSTRUMENTS) ×3 IMPLANT
MARKER MARGIN CORRECT CLIP (MARKER) ×3 IMPLANT
NEEDLE HYPO 22GX1.5 SAFETY (NEEDLE) ×3 IMPLANT
NEEDLE HYPO 25X1 1.5 SAFETY (NEEDLE) ×3 IMPLANT
PACK BASIN MINOR ARMC (MISCELLANEOUS) ×3 IMPLANT
RETRACTOR RING XSMALL (MISCELLANEOUS) IMPLANT
RTRCTR WOUND ALEXIS 13CM XS SH (MISCELLANEOUS)
SET LOCALIZER 20 PROBE US (MISCELLANEOUS) ×3 IMPLANT
SLEVE PROBE SENORX GAMMA FIND (MISCELLANEOUS) ×3 IMPLANT
SUT ETHILON 4-0 (SUTURE) ×3
SUT ETHILON 4-0 FS2 18XMFL BLK (SUTURE) ×2
SUT MNCRL 4-0 (SUTURE) ×3
SUT MNCRL 4-0 27XMFL (SUTURE) ×2
SUT SILK 2 0 SH (SUTURE) ×3 IMPLANT
SUT VIC AB 3-0 SH 27 (SUTURE) ×6
SUT VIC AB 3-0 SH 27X BRD (SUTURE) ×4 IMPLANT
SUTURE ETHLN 4-0 FS2 18XMF BLK (SUTURE) ×2 IMPLANT
SUTURE MNCRL 4-0 27XMF (SUTURE) ×2 IMPLANT
SYR 10ML LL (SYRINGE) ×6 IMPLANT
SYR BULB IRRIG 60ML STRL (SYRINGE) ×3 IMPLANT
TRAP NEPTUNE SPECIMEN COLLECT (MISCELLANEOUS) ×3 IMPLANT
WATER STERILE IRR 1000ML POUR (IV SOLUTION) ×3 IMPLANT

## 2021-07-22 NOTE — Discharge Instructions (Addendum)
  Diet: Resume home heart healthy regular diet.   Activity: No heavy lifting >20 pounds (children, pets, laundry, garbage) or strenuous activity until follow-up, but light activity and walking are encouraged. Do not drive or drink alcohol if taking narcotic pain medications.  Wound care: May shower with soapy water and pat dry (do not rub incisions), but no baths or submerging incision underwater until follow-up. (no swimming)   Medications: Resume all home medications. For mild to moderate pain: acetaminophen (Tylenol) ***or ibuprofen (if no kidney disease). Combining Tylenol with alcohol can substantially increase your risk of causing liver disease. Narcotic pain medications, if prescribed, can be used for severe pain, though may cause nausea, constipation, and drowsiness. Do not combine Tylenol and Norco within a 6 hour period as Norco contains Tylenol. If you do not need the narcotic pain medication, you do not need to fill the prescription.  Call office ([phone]) at any time if any questions, worsening pain, fevers/chills, bleeding, drainage from incision site, or other concerns.   AMBULATORY SURGERY  DISCHARGE INSTRUCTIONS   The drugs that you were given will stay in your system until tomorrow so for the next 24 hours you should not:  Drive an automobile Make any legal decisions Drink any alcoholic beverage   You may resume regular meals tomorrow.  Today it is better to start with liquids and gradually work up to solid foods.  You may eat anything you prefer, but it is better to start with liquids, then soup and crackers, and gradually work up to solid foods.   Please notify your doctor immediately if you have any unusual bleeding, trouble breathing, redness and pain at the surgery site, drainage, fever, or pain not relieved by medication.    Additional Instructions:        Please contact your physician with any problems or Same Day Surgery at [phone], Monday  through Friday 6 am to 4 pm, or [facility] Main number at [phone].  

## 2021-07-22 NOTE — Op Note (Signed)
Preoperative diagnosis: Right breast carcinoma.  Postoperative diagnosis: Right breast carcinoma..   Procedure: Right radiofrequency tag-localized partial mastectomy.                      Right Axillary Sentinel Lymph node biopsy  Anesthesia: GETA  Surgeon: Dr. Windell Moment  Wound Classification: Clean  Indications: Patient is a 87 y.o. female with a nonpalpable right breast mass noted on mammography with core biopsy demonstrating invasive mammary carcinoma requires radiofrequency tag-localized partial mastectomy for treatment with sentinel lymph node biopsy.   Findings: 1. Specimen mammography shows marker and tag on specimen 2. Pathology call refers gross examination of margins was clear 3. No other palpable mass or lymph node identified.   Description of procedure: Preoperative radiofrequency tag localization was performed by radiology. In the nuclear medicine suite, the subareolar region was injected with Tc-99 sulfur colloid. Localization studies were reviewed. The patient was taken to the operating room and placed supine on the operating table, and after general anesthesia the right chest and axilla were prepped and draped in the usual sterile fashion. A time-out was completed verifying correct patient, procedure, site, positioning, and implant(s) and/or special equipment prior to beginning this procedure.  By comparing the localization studies and interrogation with Localizer device, the probable trajectory and location of the mass was visualized. A circumareolar skin incision was planned in such a way as to minimize the amount of dissection to reach the mass.  The skin incision was made. Flaps were raised and the location of the tag was confirmed with Localizer device confirmed. A 2-0 silk figure-of-eight stay suture was placed and used for retraction. Dissection was then taken down circumferentially, taking care to include the entire localizing tag and a wide margin of grossly normal  tissue. The specimen and entire localizing tag were removed. The specimen was oriented and sent to radiology with the localization studies. Confirmation was received that the entire target lesion had been resected. The wound was irrigated. Hemostasis was checked. The wound was closed with interrupted sutures of 3-0 Vicryl and a subcuticular suture of Monocryl 3-0. No attempt was made to close the dead space.   A hand-held gamma probe was used to identify the location of the hottest spot in the axilla. An incision was made around the caudal axillary hairline. Dissection was carried down until subdermal facias was advanced. The probe was placed and again, the point of maximal count was found. Dissection continue until nodule was identified. The probe was placed in contact with the node. The node was excised in its entirety.  An additional hot spot was detected and the node was excised in similar fashion. No additional hot spots were identified. No clinically abnormal nodes were palpated. The procedure was terminated. Hemostasis was achieved and the wound closed in layers with deep interrupted 3-0 Vicryl and skin was closed with subcuticular suture of Monocryl 3-0.  The patient tolerated the procedure well and was taken to the postanesthesia care unit in stable condition.   Sentinel Node Biopsy Synoptic Operative Report  Operation performed with curative intent:Yes  Tracer(s) used to identify sentinel nodes in the upfront surgery (non-neoadjuvant) setting (select all that apply):Radioactive Tracer  Tracer(s) used to identify sentinel nodes in the neoadjuvant setting (select all that apply):N/A  All nodes (colored or non-colored) present at the end of a dye-filled lymphatic channel were removed:N/A  All significantly radioactive nodes were removed:Yes  All palpable suspicious nodes were removed:N/A  Biopsy-proven positive nodes marked with  clips prior to chemotherapy were identified and  removed:N/A  Specimen: Right Breast mass                     Sentinel Lymph nodes #1, #2  Complications: None  Estimated Blood Loss: 10 mL

## 2021-07-22 NOTE — Transfer of Care (Signed)
Immediate Anesthesia Transfer of Care Note  Patient: Pamela Paul  Procedure(s) Performed: PART [place] SENTINEL NODE BIOPSY (Right) MELANOMA EXCISION WITH SENTINEL LYMPH NODE BIOPSY (Right: Breast)  Patient Location: PACU  Anesthesia Type:General  Level of Consciousness: awake and alert   Airway & Oxygen Therapy: Patient Spontanous Breathing and Patient connected to face mask oxygen  Post-op Assessment: Report given to RN and Post -op Vital signs reviewed and stable  Post vital signs: Reviewed and stable  Last Vitals:  Vitals Value Taken Time  BP 136/56 07/22/21 1046  Temp    Pulse 80 07/22/21 1049  Resp 16 07/22/21 1049  SpO2 99 % 07/22/21 1049  Vitals shown include unvalidated device data.  Last Pain:  Vitals:   07/22/21 0815  TempSrc: Temporal  PainSc: 0-No pain         Complications: No notable events documented.

## 2021-07-22 NOTE — Interval H&P Note (Signed)
History and Physical Interval Note:  07/22/2021 2:16 PM  Pamela Paul  has presented today for surgery, with the diagnosis of Malignant neoplasm of upper-outer quadrant of right breast in female, estrogen receptor positive CMS-HCC C50.411, Z17.0.  The various methods of treatment have been discussed with the patient and family. After consideration of risks, benefits and other options for treatment, the patient has consented to  Procedure(s): PART [place] (Right) MELANOMA EXCISION WITH SENTINEL LYMPH NODE BIOPSY (Right) as a surgical intervention.  The patient's history has been reviewed, patient examined, no change in status, stable for surgery.  I have reviewed the patient's chart and labs.  Questions were answered to the patient's satisfaction.     Herbert Pun

## 2021-07-22 NOTE — Anesthesia Postprocedure Evaluation (Signed)
Anesthesia Post Note  Patient: Pamela Paul  Procedure(s) Performed: PART [place] SENTINEL NODE BIOPSY (Right) MELANOMA EXCISION WITH SENTINEL LYMPH NODE BIOPSY (Right: Breast)  Patient location during evaluation: PACU Anesthesia Type: General Level of consciousness: awake and alert and oriented Pain management: pain level controlled Vital Signs Assessment: post-procedure vital signs reviewed and stable Respiratory status: spontaneous breathing, nonlabored ventilation and respiratory function stable Cardiovascular status: blood pressure returned to baseline and stable Postop Assessment: no signs of nausea or vomiting Anesthetic complications: no   No notable events documented.   Last Vitals:  Vitals:   07/22/21 1116 07/22/21 1128  BP: 125/68 93/76  Pulse: 91 94  Resp: (!) 23 20  Temp: (!) 36.2 C (!) 36.1 C  SpO2: 94% 97%    Last Pain:  Vitals:   07/22/21 1128  TempSrc: Temporal  PainSc: 2                  Lindsay Straka

## 2021-07-22 NOTE — Anesthesia Procedure Notes (Signed)
Procedure Name: LMA Insertion Date/Time: 07/22/2021 9:25 AM Performed by: Demetrius Charity, CRNA Pre-anesthesia Checklist: Patient identified, Patient being monitored, Timeout performed, Emergency Drugs available and Suction available Patient Re-evaluated:Patient Re-evaluated prior to induction Oxygen Delivery Method: Circle system utilized Preoxygenation: Pre-oxygenation with 100% oxygen Induction Type: IV induction Ventilation: Mask ventilation without difficulty LMA: LMA inserted LMA Size: 3.5 Tube type: Oral Number of attempts: 1 Placement Confirmation: positive ETCO2 and breath sounds checked- equal and bilateral Tube secured with: Tape Dental Injury: Teeth and Oropharynx as per pre-operative assessment  Comments: Attempted to place 3.0 LMA.  Unable to ventilate appropriately and placed 3.5 LMA successfully.

## 2021-07-22 NOTE — Anesthesia Preprocedure Evaluation (Signed)
Anesthesia Evaluation  Patient identified by MRN, date of birth, ID band Patient awake    Reviewed: Allergy & Precautions, NPO status , Patient's Chart, lab work & pertinent test results  History of Anesthesia Complications Negative for: history of anesthetic complications  Airway Mallampati: II  TM Distance: >3 FB Neck ROM: Full    Dental  (+) Implants   Pulmonary neg sleep apnea, neg COPD, former smoker,    breath sounds clear to auscultation- rhonchi (-) wheezing      Cardiovascular Exercise Tolerance: Good hypertension, Pt. on medications (-) CAD, (-) Past MI, (-) Cardiac Stents and (-) CABG  Rhythm:Regular Rate:Normal - Systolic murmurs and - Diastolic murmurs    Neuro/Psych neg Seizures negative neurological ROS  negative psych ROS   GI/Hepatic negative GI ROS, Neg liver ROS,   Endo/Other  neg diabetesHypothyroidism   Renal/GU CRFRenal disease (renal cyst)     Musculoskeletal  (+) Arthritis ,   Abdominal (+) - obese,   Peds  Hematology  (+) anemia ,   Anesthesia Other Findings Past Medical History: No date: CKD (chronic kidney disease) stage 3, GFR 30-59 ml/min (HCC) No date: Heart disease     Comment:  mitral valvular No date: Hyperlipidemia No date: Hypertension No date: Hypothyroidism 2006: Spinal stenosis of lumbar region     Comment:  s/p surgery   Reproductive/Obstetrics                             Anesthesia Physical Anesthesia Plan  ASA: 3  Anesthesia Plan: General   Post-op Pain Management:    Induction: Intravenous  PONV Risk Score and Plan: 2 and Ondansetron and Dexamethasone  Airway Management Planned: LMA  Additional Equipment:   Intra-op Plan:   Post-operative Plan:   Informed Consent: I have reviewed the patients History and Physical, chart, labs and discussed the procedure including the risks, benefits and alternatives for the proposed  anesthesia with the patient or authorized representative who has indicated his/her understanding and acceptance.     Dental advisory given  Plan Discussed with: CRNA and Anesthesiologist  Anesthesia Plan Comments:         Anesthesia Quick Evaluation

## 2021-07-27 ENCOUNTER — Other Ambulatory Visit: Payer: Self-pay | Admitting: Anatomic Pathology & Clinical Pathology

## 2021-08-02 ENCOUNTER — Encounter: Payer: Self-pay | Admitting: Oncology

## 2021-08-02 ENCOUNTER — Inpatient Hospital Stay: Payer: Medicare HMO | Admitting: Oncology

## 2021-08-02 VITALS — BP 114/77 | HR 91 | Temp 97.8°F | Resp 17 | Wt 185.0 lb

## 2021-08-02 DIAGNOSIS — C50411 Malignant neoplasm of upper-outer quadrant of right female breast: Secondary | ICD-10-CM

## 2021-08-02 DIAGNOSIS — Z7189 Other specified counseling: Secondary | ICD-10-CM | POA: Insufficient documentation

## 2021-08-02 DIAGNOSIS — Z170 Estrogen receptor positive status [ER+]: Secondary | ICD-10-CM | POA: Diagnosis not present

## 2021-08-02 NOTE — Progress Notes (Signed)
Hematology/Oncology Consult note [facility]  [phone] [phone]  Patient Care Team: [name], MD as PCP - General (Internal Medicine) Theodore Demark, RN as Oncology Nurse Navigator   Name of the patient: Pamela Paul  [id number]  03/28/1926   Date of visit: 08/02/21  Diagnosis-pathological prognostic stage Ib invasive mammary carcinoma of the right breast pT2 pN0 cM0 ER/PR positive HER2 negative  Chief complaint/ Reason for visit-discuss final pathology results and further management  Heme/Onc history: patient is a 87 year old female with a past medical history significant for hyperlipidemia, CKD stage III hypertension among other medical problems.  She self palpated a breast lump in her right breast which prompted a bilateral diagnostic mammogram Which showed a 3.7 cm mass in the upper outer quadrant of the right breast and was roughly 2.2 cm on ultrasound.  No abnormal adenopathy noted in the right axilla.  This was biopsied and was consistent with invasive mammary carcinoma grade 2 ER greater than 90% positive PR weakly +1 to 10% and HER2 equivocal by IHC.     FISH testing put her in group 4 Number of observers: 2  Number of invasive tumor cells counted: 40  Dual probe assay  Average number of HER2 signals per cell: 4.1  Average number of CEP17 signals per cell: 3.3  HER2/CEP17 ratio: 1.25   FINAL HER2 negative   Final lumpectomy pathology on 07/22/2021 showed invasive mammary carcinoma 2.9 x 2.9 x 1.7 cm grade 3 with negative margins.  Lymphovascular invasion present.  2 sentinel lymph nodes negative for malignancy.  Interval history-patient is recovered well from her lumpectomy and overall denies any soreness at the lumpectomy site.  ECOG PS- 1 Pain scale- 0  Review of systems- Review of Systems  Constitutional:  Negative for chills, fever, malaise/fatigue and weight loss.  HENT:  Negative for congestion, ear  discharge and nosebleeds.   Eyes:  Negative for blurred vision.  Respiratory:  Negative for cough, hemoptysis, sputum production, shortness of breath and wheezing.   Cardiovascular:  Negative for chest pain, palpitations, orthopnea and claudication.  Gastrointestinal:  Negative for abdominal pain, blood in stool, constipation, diarrhea, heartburn, melena, nausea and vomiting.  Genitourinary:  Negative for dysuria, flank pain, frequency, hematuria and urgency.  Musculoskeletal:  Negative for back pain, joint pain and myalgias.  Skin:  Negative for rash.  Neurological:  Negative for dizziness, tingling, focal weakness, seizures, weakness and headaches.  Endo/Heme/Allergies:  Does not bruise/bleed easily.  Psychiatric/Behavioral:  Negative for depression and suicidal ideas. The patient does not have insomnia.     No Known Allergies   Past Medical History:  Diagnosis Date   CKD (chronic kidney disease) stage 3, GFR 30-59 ml/min (HCC)    Heart disease    mitral valvular   Hyperlipidemia    Hypertension    Hypothyroidism    Spinal stenosis of lumbar region 2006   s/p surgery     Past Surgical History:  Procedure Laterality Date   ABDOMINAL HYSTERECTOMY     bilateral cataract surgery     BREAST LUMPECTOMY Right 07/22/2021   CHOLECYSTECTOMY  12/04/1978   FRACTURE SURGERY Left 12/29/2018   FEMUR   JOINT REPLACEMENT     Bilateral knee   LAPAROSCOPIC SIGMOID COLECTOMY  12/04/2009   secondary to benign mass   MELANOMA EXCISION WITH SENTINEL LYMPH NODE BIOPSY Right 07/22/2021   Procedure: MELANOMA EXCISION WITH SENTINEL LYMPH NODE BIOPSY;  Surgeon: Herbert Pun, MD;  Location: [facility]  ORS;  Service: General;  Laterality: Right;   PART [place] NODE BIOPSY Right 07/22/2021   Procedure: PART MASTECTOMY,RADIO FREQUENCY LOCALIZER,AXILLARY SENTINEL NODE BIOPSY;  Surgeon: Herbert Pun, MD;  Location: [facility] ORS;  Service: General;   Laterality: Right;   REPLACEMENT TOTAL KNEE BILATERAL     SMALL INTESTINE SURGERY  12/04/2008   for SBO   SPINE SURGERY  12/04/2004   [place], [facility]   tendon repair     achille heall, left     Social History   Socioeconomic History   Marital status: Widowed    Spouse name: Not on file   Number of children: Not on file   Years of education: Not on file   Highest education level: Not on file  Occupational History   Not on file  Tobacco Use   Smoking status: Former    Types: Cigarettes    Quit date: 03/15/1961    Years since quitting: 60.4   Smokeless tobacco: Never   Tobacco comments:    Quit many years ago.  Vaping Use   Vaping Use: Never used  Substance and Sexual Activity   Alcohol use: Not Currently   Drug use: No   Sexual activity: Never  Other Topics Concern   Not on file  Social History Narrative   [place]   Social Determinants of Health   Financial Resource Strain: Low Risk    Difficulty of Paying Living Expenses: Not hard at all  Food Insecurity: No Food Insecurity   Worried About Charity fundraiser in the Last Year: Never true   Arboriculturist in the Last Year: Never true  Transportation Needs: No Transportation Needs   Lack of Transportation (Medical): No   Lack of Transportation (Non-Medical): No  Physical Activity: Sufficiently Active   Days of Exercise per Week: 5 days   Minutes of Exercise per Session: 60 min  Stress: No Stress Concern Present   Feeling of Stress : Not at all  Social Connections: Unknown   Frequency of Communication with Friends and Family: More than three times a week   Frequency of Social Gatherings with Friends and Family: Twice a week   Attends Religious Services: Not on Electrical engineer or Organizations: Yes   Attends Archivist Meetings: Not on file   Marital Status: Widowed  Human resources officer Violence: Not At Risk   Fear of Current or Ex-Partner: No   Emotionally Abused: No   Physically  Abused: No   Sexually Abused: No    Family History  Problem Relation Age of Onset   Arthritis Mother    Stroke Mother    Hyperlipidemia Father    Cancer Father    Heart disease Paternal Aunt      Current Outpatient Medications:    Ascorbic Acid (VITAMIN C) 1000 MG tablet, Take 1,000 mg by mouth daily., Disp: , Rfl:    cholecalciferol (VITAMIN D) 1000 units tablet, Take 1,000 Units by mouth daily., Disp: , Rfl:    levothyroxine (SYNTHROID) 50 MCG tablet, TAKE 1 TABLET BY MOUTH  DAILY, Disp: 90 tablet, Rfl: 3   Multiple Vitamins-Minerals (PRESERVISION AREDS PO), Take 1 capsule by mouth 2 (two) times daily., Disp: , Rfl:    telmisartan (MICARDIS) 20 MG tablet, TAKE 1 TABLET BY MOUTH DAILY, Disp: 90 tablet, Rfl: 3   Turmeric 400 MG CAPS, Take 400 mg by mouth daily., Disp: , Rfl:   Physical exam:  Vitals:   08/02/21 1027  BP: 114/77  Pulse: 91  Resp: 17  Temp: 97.8 F (36.6 C)  TempSrc: Oral  SpO2: 100%  Weight: 185 lb (83.9 kg)   Physical Exam Constitutional:      General: She is not in acute distress. Pulmonary:     Effort: Pulmonary effort is normal.  Skin:    General: Skin is warm and dry.  Neurological:     Mental Status: She is alert and oriented to person, place, and time.    Patient is s/p right lumpectomy and sentinel lymph node biopsy with a well-healed surgical scar.  No evidence of local infection or inflammation.   CMP Latest Ref Rng & Units 07/19/2021  Glucose 70 - 99 mg/dL 129(H)  BUN 8 - 23 mg/dL 27(H)  Creatinine 0.44 - 1.00 mg/dL 1.37(H)  Sodium 135 - 145 mmol/L 136  Potassium 3.5 - 5.1 mmol/L 4.1  Chloride 98 - 111 mmol/L 100  CO2 22 - 32 mmol/L 23  Calcium 8.9 - 10.3 mg/dL 9.1  Total Protein 6.0 - 8.3 g/dL -  Total Bilirubin 0.2 - 1.2 mg/dL -  Alkaline Phos 25 - 125 -  AST 13 - 35 -  ALT 7 - 35 -   CBC Latest Ref Rng & Units 07/19/2021  WBC 4.0 - 10.5 K/uL 8.3  Hemoglobin 12.0 - 15.0 g/dL 12.9  Hematocrit 36.0 - 46.0 % 36.9  Platelets 150  - 400 K/uL 271    No images are attached to the encounter.  NM Sentinel Node Inj-No Rpt (Breast)  Result Date: 07/22/2021 Sulfur Colloid was injected by the Nuclear Medicine Technologist for sentinel lymph node localization.   MM Breast Surgical Specimen  Result Date: 07/22/2021 CLINICAL DATA:  Status post radiofrequency device localized right breast lumpectomy. EXAM: SPECIMEN RADIOGRAPH OF THE RIGHT BREAST COMPARISON:  Previous exam(s). FINDINGS: Status post excision of the right breast. The radiofrequency devices (2 total) and Venus shaped clip are present within the specimen. IMPRESSION: Specimen radiograph of the right breast. Electronically Signed   By: Ileana Roup M.D.   On: 07/22/2021 10:29  MM RT RADIO FREQUENCY TAG LOC MAMMO GUIDE  Result Date: 07/19/2021 CLINICAL DATA:  87 year old female presenting for localization of a right breast mass for lumpectomy. EXAM: MAMMOGRAPHIC GUIDED RADIOFREQUENCY DEVICE LOCALIZATION OF THE RIGHT BREAST COMPARISON:  Previous exam(s) FINDINGS: Patient presents for radiofrequency device localization prior to right breast lumpectomy. I met with the patient and we discussed the procedure of radiofrequency device localization including benefits and alternatives. We discussed the high likelihood of a successful procedure. We discussed the risks of the procedure including infection, bleeding, tissue injury and further surgery. Informed, written consent was given. The usual time-out protocol was performed immediately prior to the procedure. Using mammographic guidance, sterile technique, 1% lidocaine as local anesthesia, a radiofrequency tag was used to localize the anterior and posterior margin of the mass with the venous shaped biopsy marking clip in the upper-outer right breast using a lateral approach. The follow-up mammogram images confirm that the RF device is in the expected location and are marked for Dr. Windell Moment. The patient tolerated the procedure well  and was released from the Breast Center. IMPRESSION: Radiofrequency device localization of the the anterior and posterior margin of the mass in the upper-outer right breast. No apparent complications. The positioning of the 2 RF tags was discussed with Dr. Windell Moment at 4:39pm on 07/19/21. Electronically Signed   By: Ammie Ferrier M.D.   On: 07/19/2021  16:39  MM RT RADIO FREQUENCY TAG EA ADD LESION LOC MAMMO GUIDE  Result Date: 07/19/2021 CLINICAL DATA:  87 year old female presenting for localization of a right breast mass for lumpectomy. EXAM: MAMMOGRAPHIC GUIDED RADIOFREQUENCY DEVICE LOCALIZATION OF THE RIGHT BREAST COMPARISON:  Previous exam(s) FINDINGS: Patient presents for radiofrequency device localization prior to right breast lumpectomy. I met with the patient and we discussed the procedure of radiofrequency device localization including benefits and alternatives. We discussed the high likelihood of a successful procedure. We discussed the risks of the procedure including infection, bleeding, tissue injury and further surgery. Informed, written consent was given. The usual time-out protocol was performed immediately prior to the procedure. Using mammographic guidance, sterile technique, 1% lidocaine as local anesthesia, a radiofrequency tag was used to localize the anterior and posterior margin of the mass with the venous shaped biopsy marking clip in the upper-outer right breast using a lateral approach. The follow-up mammogram images confirm that the RF device is in the expected location and are marked for Dr. Windell Moment. The patient tolerated the procedure well and was released from the Breast Center. IMPRESSION: Radiofrequency device localization of the the anterior and posterior margin of the mass in the upper-outer right breast. No apparent complications. The positioning of the 2 RF tags was discussed with Dr. Windell Moment at 4:39pm on 07/19/21. Electronically Signed   By: Ammie Ferrier  M.D.   On: 07/19/2021 16:39    Assessment and plan- Patient is a 87 y.o. female with pathological prognostic stage Ib invasive mammary carcinoma of the right breast pT2 pN0 cM0 ER strongly positive greater than 90%, PR weakly positive and HER2 negative here to discuss further management  Discussed the results of final pathology which showed 1.9 cm grade 3 tumor with negative margins.  2 sentinel lymph nodes negative for malignancy.  Lymphovascular invasion present.  I have asked pathology to repeat ER/PR and HER2 testing on the final specimen as well.  On her biopsy specimen her tumor was strongly positive for ER weakly positive for PR.  HER2 was equivocal by IHC and group 4 by FISH testing which defaulted to negative.  I would like to confirm if she is truly HER2 negative on her final pathology specimen as well.  We discussed that she does have some high risk features including lymphovascular invasion grade 3 tumor.  A weakly PR positive tumor is also one of the high risk factors.  If her final molecular testing again shows HER2 negative status-he would not require any HER2 directed treatment.  We discussed risks versus benefits of adjuvant radiation treatment and that would entail 5 to 6 weeks of daily radiation treatments.  Patient understands the overall process and would like to forego any adjuvant radiation treatment at this time.  Given that her tumor is strongly ER positive there would be role for hormone therapy if patient wishes to consider it.  Overall survival benefit at her age is probably not significant and cannot be calculated on the basis of predict risk score as her age is greater than 17.  However patient has a good performance status and she does have some high risk features on her pathology as above.  I discussed risks and benefits of both tamoxifen and Arimidex including all but not limited to fatigue, mood swings, arthralgias and worsening bone health associated with AI, risk of DVT  and uterine cancer associated with tamoxifen.  Written information about both the drugs have been given to the patient.  We will proceed  with a bone density scan at this time.  I will see her back for a video visit in about 2 weeks time after her ER/PR HER2" density results are back we will discuss further management  Patient and her 2 sons verbalized understanding of the plan.  Treatment will be given with a curative intent  Cancer Staging Malignant neoplasm of upper-outer quadrant of right breast in female, estrogen receptor positive ([place]) Staging form: Breast, AJCC 8th Edition - Pathologic stage from 08/02/2021: Stage IB (pT2, pN0, cM0, G3, ER+, PR+, HER2-) - Signed by Sindy Guadeloupe, MD on 08/02/2021 Stage prefix: Initial diagnosis Multigene prognostic tests performed: None Histologic grading system: 3 grade system    Visit Diagnosis 1. Malignant neoplasm of upper-outer quadrant of right breast in female, estrogen receptor positive ([place])   2. Goals of care, counseling/discussion      Dr. Randa Evens, MD, MPH [facility] at [facility] [phone] 08/02/2021 4:47 PM

## 2021-08-03 ENCOUNTER — Other Ambulatory Visit: Payer: Self-pay

## 2021-08-03 DIAGNOSIS — C50919 Malignant neoplasm of unspecified site of unspecified female breast: Secondary | ICD-10-CM

## 2021-08-10 ENCOUNTER — Telehealth: Payer: Self-pay

## 2021-08-10 NOTE — Telephone Encounter (Signed)
Pt is scheduled for bone density scan at the [place] [facility] center for 9/8 at [place] her son and made him aware of appointment details, he understands.

## 2021-08-15 ENCOUNTER — Encounter: Payer: Self-pay | Admitting: Oncology

## 2021-08-15 LAB — SURGICAL PATHOLOGY

## 2021-08-17 ENCOUNTER — Encounter: Payer: Self-pay | Admitting: Internal Medicine

## 2021-08-17 ENCOUNTER — Other Ambulatory Visit: Payer: Self-pay

## 2021-08-17 ENCOUNTER — Ambulatory Visit (INDEPENDENT_AMBULATORY_CARE_PROVIDER_SITE_OTHER): Payer: Medicare HMO | Admitting: Internal Medicine

## 2021-08-17 VITALS — BP 100/60 | HR 96 | Temp 95.6°F | Ht 66.0 in | Wt 188.8 lb

## 2021-08-17 DIAGNOSIS — N2581 Secondary hyperparathyroidism of renal origin: Secondary | ICD-10-CM

## 2021-08-17 DIAGNOSIS — E7800 Pure hypercholesterolemia, unspecified: Secondary | ICD-10-CM | POA: Diagnosis not present

## 2021-08-17 DIAGNOSIS — E559 Vitamin D deficiency, unspecified: Secondary | ICD-10-CM

## 2021-08-17 DIAGNOSIS — Z0000 Encounter for general adult medical examination without abnormal findings: Secondary | ICD-10-CM | POA: Diagnosis not present

## 2021-08-17 DIAGNOSIS — N1832 Chronic kidney disease, stage 3b: Secondary | ICD-10-CM

## 2021-08-17 DIAGNOSIS — E875 Hyperkalemia: Secondary | ICD-10-CM

## 2021-08-17 DIAGNOSIS — B351 Tinea unguium: Secondary | ICD-10-CM

## 2021-08-17 DIAGNOSIS — E034 Atrophy of thyroid (acquired): Secondary | ICD-10-CM

## 2021-08-17 DIAGNOSIS — I129 Hypertensive chronic kidney disease with stage 1 through stage 4 chronic kidney disease, or unspecified chronic kidney disease: Secondary | ICD-10-CM

## 2021-08-17 LAB — COMPREHENSIVE METABOLIC PANEL
ALT: 15 U/L (ref 0–35)
AST: 20 U/L (ref 0–37)
Albumin: 4 g/dL (ref 3.5–5.2)
Alkaline Phosphatase: 65 U/L (ref 39–117)
BUN: 23 mg/dL (ref 6–23)
CO2: 23 mEq/L (ref 19–32)
Calcium: 9.5 mg/dL (ref 8.4–10.5)
Chloride: 102 mEq/L (ref 96–112)
Creatinine, Ser: 1.43 mg/dL — ABNORMAL HIGH (ref 0.40–1.20)
GFR: 31.64 mL/min — ABNORMAL LOW (ref >60.00)
Glucose, Bld: 143 mg/dL — ABNORMAL HIGH (ref 70–99)
Potassium: 4.2 mEq/L (ref 3.5–5.1)
Sodium: 137 mEq/L (ref 135–145)
Total Bilirubin: 0.7 mg/dL (ref 0.2–1.2)
Total Protein: 6.8 g/dL (ref 6.0–8.3)

## 2021-08-17 LAB — TSH: TSH: 3.97 u[IU]/mL (ref 0.35–5.50)

## 2021-08-17 LAB — VITAMIN D 25 HYDROXY (VIT D DEFICIENCY, FRACTURES): VITD: 49.6 ng/mL (ref 30.00–100.00)

## 2021-08-17 NOTE — Assessment & Plan Note (Signed)
Well controlled on current regimen. Renal function stable, no changes today. 

## 2021-08-17 NOTE — Assessment & Plan Note (Signed)
Resolved by last BMET august 16  Lab Results  Component Value Date   NA 136 07/19/2021   K 4.1 07/19/2021   CL 100 07/19/2021   CO2 23 07/19/2021

## 2021-08-17 NOTE — Assessment & Plan Note (Signed)
Recommending topical treatment with tea tree oil

## 2021-08-17 NOTE — Progress Notes (Addendum)
Patient ID: Pamela Paul, female    DOB: [birth year]  Age: 87 y.o. MRN: [id number]  The patient is here for annual  preventive  examination and management of other chronic and acute problems.  This visit occurred during the SARS-CoV-2 public health emergency.  Safety protocols were in place, including screening questions prior to the visit, additional usage of staff PPE, and extensive cleaning of exam room while observing appropriate contact time as indicated for disinfecting solutions.     The risk factors are reflected in the social history.  The roster of all physicians providing medical care to patient - is listed in the Snapshot section of the chart.  Activities of daily living:  The patient is 100% independent in all ADLs: dressing, toileting, feeding as well as independent mobility  Home safety : The patient has smoke detectors in the home. They wear seatbelts.  There are no firearms at home. There is no violence in the home.   There is no risks for hepatitis, STDs or HIV. There is no   history of blood transfusion. They have no travel history to infectious disease endemic areas of the world.  The patient has seen their dentist in the last six month. They have seen their eye doctor in the last year. They admit to slight hearing difficulty with regard to whispered voices and some television programs.  They have deferred audiologic testing in the last year.  They do not  have excessive sun exposure. Discussed the need for sun protection: hats, long sleeves and use of sunscreen if there is significant sun exposure.   Diet: the importance of a healthy diet is discussed. They do have a healthy diet.  The benefits of regular aerobic exercise were discussed. She walks 4 times per week ,  20 minutes.   Depression screen: there are no signs or vegative symptoms of depression- irritability, change in appetite, anhedonia, sadness/tearfullness.  Cognitive assessment: the patient manages all their  financial and personal affairs and is actively engaged. They could relate day,date,year and events; recalled 2/3 objects at 3 minutes; performed clock-face test normally.  The following portions of the patient's history were reviewed and updated as appropriate: allergies, current medications, past family history, past medical history,  past surgical history, past social history  and problem list.  Visual acuity was not assessed per patient preference since she has regular follow up with her ophthalmologist. Hearing and body mass index were assessed and reviewed.   During the course of the visit the patient was educated and counseled about appropriate screening and preventive services including : fall prevention , diabetes screening, nutrition counseling, colorectal cancer screening, and recommended immunizations.    CC: The primary encounter diagnosis was Vitamin D deficiency. Diagnoses of Pure hypercholesterolemia, Hypothyroidism due to acquired atrophy of thyroid, Stage 3b chronic kidney disease ([place]), Onychomycosis of left great toe, Secondary hyperparathyroidism of renal origin ([place]), Hyperkalemia, Benign hypertensive kidney disease with chronic kidney disease stage I through stage IV, or unspecified, and Encounter for preventive health examination were also pertinent to this visit.  1) invasive mammary CA right breast, dfound by self exam   , 3 x 3 cm  mass .; s/p partial mastectomy August 19   with l/v invasion  noted , surgery done by Dr. Peyton Najjar,   complicated by small seroma. Seen yesterday by surgeon  .  4 week follow up planned.  Seeing Dr Janese Banks or treamtnet of ER positive .  No XRT per patient preference:  Ardjuvant therapy  discussed not decided upon yet  Taking  1000 Ius D3 daily Reviewed DEXA  osteopenia     History Pamela Paul has a past medical history of CKD (chronic kidney disease) stage 3, GFR 30-59 ml/min ([place]), Heart disease, Hyperlipidemia, Hypertension, Hypothyroidism, and Spinal  stenosis of lumbar region (2006).   She has a past surgical history that includes tendon repair; Spine surgery (12/04/2004); Small intestine surgery (12/04/2008); Cholecystectomy (12/04/1978); Joint replacement; Abdominal hysterectomy; bilateral cataract surgery; Replacement total knee bilateral; Laparoscopic sigmoid colectomy (12/04/2009); Fracture surgery (Left, 12/29/2018); Part mastectomy,radio frequency localizer,axillary sentinel node biopsy (Right, 07/22/2021); Melanoma excision with sentinel lymph node dissection (Right, 07/22/2021); and Breast lumpectomy (Right, 07/22/2021).   Her family history includes Arthritis in her mother; Cancer in her father; Heart disease in her paternal aunt; Hyperlipidemia in her father; Stroke in her mother.She reports that she quit smoking about 60 years ago. Her smoking use included cigarettes. She has never used smokeless tobacco. She reports that she does not currently use alcohol. She reports that she does not use drugs.  Outpatient Medications Prior to Visit  Medication Sig Dispense Refill   Ascorbic Acid (VITAMIN C) 1000 MG tablet Take 1,000 mg by mouth daily.     levothyroxine (SYNTHROID) 50 MCG tablet TAKE 1 TABLET BY MOUTH  DAILY 90 tablet 3   Multiple Vitamins-Minerals (PRESERVISION AREDS PO) Take 1 capsule by mouth 2 (two) times daily.     telmisartan (MICARDIS) 20 MG tablet TAKE 1 TABLET BY MOUTH DAILY 90 tablet 3   Turmeric 400 MG CAPS Take 400 mg by mouth daily.     cholecalciferol (VITAMIN D) 1000 units tablet Take 1,000 Units by mouth daily.     No facility-administered medications prior to visit.    Review of Systems  Patient denies headache, fevers, malaise, unintentional weight loss, skin rash, eye pain, sinus congestion and sinus pain, sore throat, dysphagia,  hemoptysis , cough, dyspnea, wheezing, chest pain, palpitations, orthopnea, edema, abdominal pain, nausea, melena, diarrhea, constipation, flank pain, dysuria, hematuria, urinary   Frequency, nocturia, numbness, tingling, seizures,  Focal weakness, Loss of consciousness,  Tremor, insomnia, depression, anxiety, and suicidal ideation.     Objective:  BP 100/60 (BP Location: Left Arm, Patient Position: Sitting, Cuff Size: Large)   Pulse 96   Temp (!) 95.6 F (35.3 C) (Temporal)   Ht 5\' 6"  (1.676 m)   Wt 188 lb 12.8 oz (85.6 kg)   SpO2 97%   BMI 30.47 kg/m   Physical Exam . General appearance: alert, cooperative and appears stated age Ears: normal TM's and external ear canals both ears Throat: lips, mucosa, and tongue normal; teeth and gums normal Neck: no adenopathy, no carotid bruit, supple, symmetrical, trachea midline and thyroid not enlarged, symmetric, no tenderness/mass/nodules Back: symmetric, no curvature. ROM normal. No CVA tenderness. Lungs: clear to auscultation bilaterally Heart: regular rate and rhythm, S1, S2 normal, no murmur, click, rub or gallop Abdomen: soft, non-tender; bowel sounds normal; no masses,  no organomegaly Pulses: 2+ and symmetric Skin: Skin color, texture, turgor normal. No rashes or lesions Lymph nodes: Cervical, supraclavicular, and axillary nodes normal.  Ext: left great toenail thick , discolored and distrophic vs fungal  Assessment & Plan:   Problem List Items Addressed This Visit       Unprioritized   Benign hypertensive kidney disease with chronic kidney disease    Well controlled on current regimen. Renal function stable, no changes today.      Encounter for preventive health examination  age appropriate education and counseling updated, referrals for preventative services and immunizations addressed, dietary and smoking counseling addressed, most recent labs reviewed.  I have personally reviewed and have noted:   1) the patient's medical and social history 2) The pt's use of alcohol, tobacco, and illicit drugs 3) The patient's current medications and supplements 4) Functional ability including ADL's, fall  risk, home safety risk, hearing and visual impairment 5) Diet and physical activities 6) Evidence for depression or mood disorder 7) The patient's height, weight, and BMI have been recorded in the chart   I have made referrals, and provided counseling and education based on review of the above      Hyperkalemia    Resolved by last BMET august 16  Lab Results  Component Value Date   NA 136 07/19/2021   K 4.1 07/19/2021   CL 100 07/19/2021   CO2 23 07/19/2021         Hyperlipidemia   Relevant Orders   Lipid Panel w/reflex Direct LDL (Completed)   Hypothyroidism    Thyroid function is WNL on current dose.  No current changes needed.   Lab Results  Component Value Date   TSH 3.97 08/17/2021         Relevant Orders   TSH (Completed)   Onychomycosis of left great toe    Recommending topical treatment with tea tree oil       Secondary hyperparathyroidism of renal origin ([place])    Checking PTH and Vitam d today .  Sees [place] every 6 months .  She is on an ARB  for control of hypertension.    Lab Results  Component Value Date   NA 136 07/19/2021   K 4.1 07/19/2021   CL 100 07/19/2021   CO2 23 07/19/2021   Lab Results  Component Value Date   CREATININE 1.37 (H) 07/19/2021         Stage 3b chronic kidney disease (HCC)    Stable, followed by Dr Abigail Butts.  Renal function is at  baseline with avoidance of NSAIDs.  She is on an ARB  for control of hypertension.    Lab Results  Component Value Date   NA 137 08/17/2021   K 4.2 08/17/2021   CL 102 08/17/2021   CO2 23 08/17/2021   Lab Results  Component Value Date   CREATININE 1.43 (H) 08/17/2021         Relevant Orders   PTH, Intact and Calcium (Completed)   Comprehensive metabolic panel (Completed)   Vitamin D deficiency - Primary   Relevant Orders   VITAMIN D 25 Hydroxy (Vit-D Deficiency, Fractures) (Completed)    No orders of the defined types were placed in this encounter.   There are no  discontinued medications.  Follow-up: No follow-ups on file.   [name], MD

## 2021-08-17 NOTE — Patient Instructions (Addendum)
I recommend getting the majority of your calcium and Vitamin D  through diet rather than supplements given the recent association of calcium supplements with increased coronary artery calcium scores (You need 1200 mg daily )   Unsweetened almond/coconut milk is a great low calorie low carb, cholesterol free  way to increase your dietary calcium and vitamin D.  Try the blue Diamond  brand    Your toenail may be funky due to a fungal infection.    You can treat this naturally with tea tree oil or vinegar applied daily to the nail after showering.  It is a very slow process to heal  Alternative:  referral to podiatry to have toenail removed

## 2021-08-17 NOTE — Assessment & Plan Note (Signed)
Stable, followed by Dr Abigail Butts.  Renal function is at  baseline with avoidance of NSAIDs.  She is on an ARB  for control of hypertension.    Lab Results  Component Value Date   NA 137 08/17/2021   K 4.2 08/17/2021   CL 102 08/17/2021   CO2 23 08/17/2021   Lab Results  Component Value Date   CREATININE 1.43 (H) 08/17/2021

## 2021-08-17 NOTE — Assessment & Plan Note (Signed)
Checking PTH and Vitam d today .  Sees [facility] every 6 months .  She is on an ARB  for control of hypertension.    Lab Results  Component Value Date   NA 136 07/19/2021   K 4.1 07/19/2021   CL 100 07/19/2021   CO2 23 07/19/2021   Lab Results  Component Value Date   CREATININE 1.37 (H) 07/19/2021

## 2021-08-17 NOTE — Assessment & Plan Note (Signed)
Thyroid function is WNL on current dose.  No current changes needed.   Lab Results  Component Value Date   TSH 3.97 08/17/2021

## 2021-08-18 ENCOUNTER — Inpatient Hospital Stay: Payer: Medicare HMO | Attending: Oncology | Admitting: Oncology

## 2021-08-18 DIAGNOSIS — C50411 Malignant neoplasm of upper-outer quadrant of right female breast: Secondary | ICD-10-CM | POA: Diagnosis not present

## 2021-08-18 DIAGNOSIS — Z170 Estrogen receptor positive status [ER+]: Secondary | ICD-10-CM

## 2021-08-18 DIAGNOSIS — Z7189 Other specified counseling: Secondary | ICD-10-CM

## 2021-08-18 LAB — LIPID PANEL W/REFLEX DIRECT LDL
Cholesterol: 213 mg/dL — ABNORMAL HIGH (ref <200)
HDL: 45 mg/dL — ABNORMAL LOW (ref >=50)
LDL Cholesterol (Calc): 114 mg/dL (calc) — ABNORMAL HIGH
Non-HDL Cholesterol (Calc): 168 mg/dL (calc) — ABNORMAL HIGH (ref <130)
Total CHOL/HDL Ratio: 4.7 (calc) (ref <5.0)
Triglycerides: 377 mg/dL — ABNORMAL HIGH (ref <150)

## 2021-08-18 LAB — PTH, INTACT AND CALCIUM
Calcium: 9.5 mg/dL (ref 8.6–10.4)
PTH: 60 pg/mL (ref 16–77)

## 2021-08-18 MED ORDER — ANASTROZOLE 1 MG PO TABS: 1.0000 mg | ORAL_TABLET | Freq: Every day | ORAL | 3 refills | Status: DC

## 2021-08-18 NOTE — Progress Notes (Signed)
Refill for breast cancer med. Daughter in law on visit with pt.

## 2021-08-24 ENCOUNTER — Other Ambulatory Visit: Payer: Self-pay

## 2021-08-24 ENCOUNTER — Inpatient Hospital Stay: Payer: Medicare HMO | Admitting: *Deleted

## 2021-08-24 DIAGNOSIS — C50919 Malignant neoplasm of unspecified site of unspecified female breast: Secondary | ICD-10-CM

## 2021-08-24 NOTE — Progress Notes (Signed)
Multidisciplinary Oncology Council Documentation  Pamela Paul was presented by our [facility] on 08/24/2021, which included representatives from:  Palliative Care Dietitian  Physical/Occupational Therapist Nurse Navigator Genetics Speech Therapist Survivorship RN Research RN  Pamela Paul currently presents with history of breast cancer.  We reviewed previous medical and familial history, history of present illness, and recent lab results along with all available histopathologic and imaging studies. The [place] considered available treatment options and made the following recommendations/referrals:  -No recommendations at this time.  The MOC is a meeting of clinicians from various specialty areas who evaluate and discuss patients for whom a multidisciplinary approach is being considered. Final determinations in the plan of care are those of the provider(s).   Today's extended care, comprehensive team conference, Pamela Paul was not present for the discussion and was not examined.

## 2021-08-29 NOTE — Progress Notes (Signed)
I connected with Pamela Paul on 08/29/21 at 10:00 AM EDT by video enabled telemedicine visit and verified that I am speaking with the correct person using two identifiers.   I discussed the limitations, risks, security and privacy concerns of performing an evaluation and management service by telemedicine and the availability of in-person appointments. I also discussed with the patient that there may be a patient responsible charge related to this service. The patient expressed understanding and agreed to proceed.  Other persons participating in the visit and their role in the encounter:  patients son  Patient's location:  home Provider's location:  work  Risk analyst Complaint: Discuss bone density and pathology results and further management  History of present illness: patient is a 87 year old female with a past medical history significant for hyperlipidemia, CKD stage III hypertension among other medical problems.  She self palpated a breast lump in her right breast which prompted a bilateral diagnostic mammogram Which showed a 3.7 cm mass in the upper outer quadrant of the right breast and was roughly 2.2 cm on ultrasound.  No abnormal adenopathy noted in the right axilla.  This was biopsied and was consistent with invasive mammary carcinoma grade 2 ER greater than 90% positive PR weakly +1 to 10% and HER2 equivocal by IHC.     FISH testing put her in group 4 Number of observers: 2  Number of invasive tumor cells counted: 40  Dual probe assay  Average number of HER2 signals per cell: 4.1  Average number of CEP17 signals per cell: 3.3  HER2/CEP17 ratio: 1.25   FINAL HER2 negative   Final lumpectomy pathology on 07/22/2021 showed invasive mammary carcinoma 2.9 x 2.9 x 1.7 cm grade 3 with negative margins.  Lymphovascular invasion present.  2 sentinel lymph nodes negative for malignancy.Repeat Her2 biomarker testing on the final pathology showed HER2 IHC deemed incomplete membranous staining in  the majority of cells.  Complete membrane staining identified in less than 10% of the total neoplastic cells.  Best classified as +1  Bone density scan on 08/11/2021 showed a T score of -1 at the right hip.  Interval history patient is doing well post lumpectomy and denies any complaints at this time   Review of Systems  Constitutional:  Negative for chills, fever, malaise/fatigue and weight loss.  HENT:  Negative for congestion, ear discharge and nosebleeds.   Eyes:  Negative for blurred vision.  Respiratory:  Negative for cough, hemoptysis, sputum production, shortness of breath and wheezing.   Cardiovascular:  Negative for chest pain, palpitations, orthopnea and claudication.  Gastrointestinal:  Negative for abdominal pain, blood in stool, constipation, diarrhea, heartburn, melena, nausea and vomiting.  Genitourinary:  Negative for dysuria, flank pain, frequency, hematuria and urgency.  Musculoskeletal:  Negative for back pain, joint pain and myalgias.  Skin:  Negative for rash.  Neurological:  Negative for dizziness, tingling, focal weakness, seizures, weakness and headaches.  Endo/Heme/Allergies:  Does not bruise/bleed easily.  Psychiatric/Behavioral:  Negative for depression and suicidal ideas. The patient does not have insomnia.    No Known Allergies  Past Medical History:  Diagnosis Date   CKD (chronic kidney disease) stage 3, GFR 30-59 ml/min (HCC)    Heart disease    mitral valvular   Hyperlipidemia    Hypertension    Hypothyroidism    Spinal stenosis of lumbar region 2006   s/p surgery    Past Surgical History:  Procedure Laterality Date   ABDOMINAL HYSTERECTOMY     bilateral cataract surgery  BREAST LUMPECTOMY Right 07/22/2021   CHOLECYSTECTOMY  12/04/1978   FRACTURE SURGERY Left 12/29/2018   FEMUR   JOINT REPLACEMENT     Bilateral knee   LAPAROSCOPIC SIGMOID COLECTOMY  12/04/2009   secondary to benign mass   MELANOMA EXCISION WITH SENTINEL LYMPH NODE  BIOPSY Right 07/22/2021   Procedure: MELANOMA EXCISION WITH SENTINEL LYMPH NODE BIOPSY;  Surgeon: Herbert Pun, MD;  Location: [facility] ORS;  Service: General;  Laterality: Right;   PART [place] NODE BIOPSY Right 07/22/2021   Procedure: PART MASTECTOMY,RADIO FREQUENCY LOCALIZER,AXILLARY SENTINEL NODE BIOPSY;  Surgeon: Herbert Pun, MD;  Location: [facility] ORS;  Service: General;  Laterality: Right;   REPLACEMENT TOTAL KNEE BILATERAL     SMALL INTESTINE SURGERY  12/04/2008   for SBO   SPINE SURGERY  12/04/2004   [place], [facility]   tendon repair     achille heall, left     Social History   Socioeconomic History   Marital status: Widowed    Spouse name: Not on file   Number of children: Not on file   Years of education: Not on file   Highest education level: Not on file  Occupational History   Not on file  Tobacco Use   Smoking status: Former    Types: Cigarettes    Quit date: 03/15/1961    Years since quitting: 60.4   Smokeless tobacco: Never   Tobacco comments:    Quit many years ago.  Vaping Use   Vaping Use: Never used  Substance and Sexual Activity   Alcohol use: Not Currently   Drug use: No   Sexual activity: Never  Other Topics Concern   Not on file  Social History Narrative   [place] lives   Social Determinants of Health   Financial Resource Strain: Low Risk    Difficulty of Paying Living Expenses: Not hard at all  Food Insecurity: No Food Insecurity   Worried About Charity fundraiser in the Last Year: Never true   Arboriculturist in the Last Year: Never true  Transportation Needs: No Transportation Needs   Lack of Transportation (Medical): No   Lack of Transportation (Non-Medical): No  Physical Activity: Sufficiently Active   Days of Exercise per Week: 5 days   Minutes of Exercise per Session: 60 min  Stress: No Stress Concern Present   Feeling of Stress : Not at all  Social Connections: Unknown    Frequency of Communication with Friends and Family: More than three times a week   Frequency of Social Gatherings with Friends and Family: Twice a week   Attends Religious Services: Not on Electrical engineer or Organizations: Yes   Attends Archivist Meetings: Not on file   Marital Status: Widowed  Human resources officer Violence: Not At Risk   Fear of Current or Ex-Partner: No   Emotionally Abused: No   Physically Abused: No   Sexually Abused: No    Family History  Problem Relation Age of Onset   Arthritis Mother    Stroke Mother    Hyperlipidemia Father    Cancer Father    Heart disease Paternal Aunt      Current Outpatient Medications:    anastrozole (ARIMIDEX) 1 MG tablet, Take 1 tablet (1 mg total) by mouth daily., Disp: 30 tablet, Rfl: 3   Ascorbic Acid (VITAMIN C) 1000 MG tablet, Take 1,000 mg by mouth daily., Disp: , Rfl:    Calcium Carbonate-Vitamin  D3 (CALCIUM 600+D) 600-400 MG-UNIT TABS, Take 1 tablet by mouth 2 (two) times daily., Disp: , Rfl:    levothyroxine (SYNTHROID) 50 MCG tablet, TAKE 1 TABLET BY MOUTH  DAILY, Disp: 90 tablet, Rfl: 3   Multiple Vitamins-Minerals (PRESERVISION AREDS PO), Take 1 capsule by mouth 2 (two) times daily., Disp: , Rfl:    telmisartan (MICARDIS) 20 MG tablet, TAKE 1 TABLET BY MOUTH DAILY, Disp: 90 tablet, Rfl: 3   Turmeric 400 MG CAPS, Take 400 mg by mouth daily., Disp: , Rfl:   No results found.  No images are attached to the encounter.   CMP Latest Ref Rng & Units 08/17/2021  Glucose 70 - 99 mg/dL -  BUN 6 - 23 mg/dL -  Creatinine 0.40 - 1.20 mg/dL -  Sodium 135 - 145 mEq/L -  Potassium 3.5 - 5.1 mEq/L -  Chloride 96 - 112 mEq/L -  CO2 19 - 32 mEq/L -  Calcium 8.6 - 10.4 mg/dL 9.5  Total Protein 6.0 - 8.3 g/dL -  Total Bilirubin 0.2 - 1.2 mg/dL -  Alkaline Phos 39 - 117 U/L -  AST 0 - 37 U/L -  ALT 0 - 35 U/L -   CBC Latest Ref Rng & Units 07/19/2021  WBC 4.0 - 10.5 K/uL 8.3  Hemoglobin 12.0 - 15.0 g/dL  12.9  Hematocrit 36.0 - 46.0 % 36.9  Platelets 150 - 400 K/uL 271     Observation/objective: Appears in no acute distress over video visit today.  Breathing is nonlabored  Assessment and plan:Patient is a 88 y.o. female with pathological prognostic stage Ib invasive mammary carcinoma of the right breast pT2 pN0 cM0 ER strongly positive greater than 90%, PR weakly positive and HER2 negative.  This is a visit to discuss bone density and final pathology results and further management  Patient's final pathology showed a 1.9 cm grade 3 tumor with negative margins and 2 sentinel lymph nodes were negative for malignancy.  Repeat HER2 testing on the final specimen was again negative.  She therefore hasER strongly +90 greater than 90%, PR weakly +1 to 10% and HER2 negative tumor.  She does not require any HER2 based treatment at this time.  She also does not wish to pursue adjuvant radiation treatment at her age.  We discussed that there would be a role for hormone therapy which could still be considered at her age given that this was a grade 3 tumor and therefore risk of recurrence would be somewhat higher as compared to a low-grade tumor.  Her baseline bone density scan showed mild osteopenia and would be reasonable to consider Arimidex 1 mg daily at this time along with calcium and vitamin D.  Discussed risks and benefits of Arimidex including all but not limited to fatigue, hot flashes, arthralgias and possible risk of worsening osteopenia/osteoporosis.  Patient understands and consents to taking Arimidex at this time.  Follow-up instructions: I will see her end of November for a video visit no labs  I discussed the assessment and treatment plan with the patient. The patient was provided an opportunity to ask questions and all were answered. The patient agreed with the plan and demonstrated an understanding of the instructions.   The patient was advised to call back or seek an in-person evaluation if the  symptoms worsen or if the condition fails to improve as anticipated.  Visit Diagnosis: 1. Goals of care, counseling/discussion   2. Malignant neoplasm of upper-outer quadrant of right breast in female,  estrogen receptor positive ([place])     Dr. Randa Evens, MD, MPH [facility] at [facility] Tel- [phone] 08/29/2021 8:01 AM

## 2021-09-05 DIAGNOSIS — Z0000 Encounter for general adult medical examination without abnormal findings: Secondary | ICD-10-CM | POA: Insufficient documentation

## 2021-09-05 NOTE — Assessment & Plan Note (Signed)

## 2021-09-13 ENCOUNTER — Encounter: Payer: Self-pay | Admitting: Family

## 2021-09-13 ENCOUNTER — Ambulatory Visit (INDEPENDENT_AMBULATORY_CARE_PROVIDER_SITE_OTHER): Payer: Medicare HMO | Admitting: Family

## 2021-09-13 ENCOUNTER — Other Ambulatory Visit: Payer: Self-pay

## 2021-09-13 VITALS — BP 136/78 | HR 75 | Temp 95.7°F | Ht 65.98 in | Wt 186.8 lb

## 2021-09-13 DIAGNOSIS — R5383 Other fatigue: Secondary | ICD-10-CM

## 2021-09-13 DIAGNOSIS — M25562 Pain in left knee: Secondary | ICD-10-CM

## 2021-09-13 DIAGNOSIS — M48061 Spinal stenosis, lumbar region without neurogenic claudication: Secondary | ICD-10-CM

## 2021-09-13 DIAGNOSIS — R252 Cramp and spasm: Secondary | ICD-10-CM | POA: Diagnosis not present

## 2021-09-13 DIAGNOSIS — M6281 Muscle weakness (generalized): Secondary | ICD-10-CM

## 2021-09-13 DIAGNOSIS — G8929 Other chronic pain: Secondary | ICD-10-CM

## 2021-09-13 DIAGNOSIS — Z96652 Presence of left artificial knee joint: Secondary | ICD-10-CM

## 2021-09-13 DIAGNOSIS — R0982 Postnasal drip: Secondary | ICD-10-CM

## 2021-09-13 MED ORDER — FEXOFENADINE HCL 180 MG PO TABS: 180.0000 mg | ORAL_TABLET | Freq: Every day | ORAL | 1 refills | Status: DC

## 2021-09-14 ENCOUNTER — Telehealth: Payer: Self-pay | Admitting: Internal Medicine

## 2021-09-14 LAB — COMPREHENSIVE METABOLIC PANEL
ALT: 23 U/L (ref 0–35)
AST: 30 U/L (ref 0–37)
Albumin: 4.1 g/dL (ref 3.5–5.2)
Alkaline Phosphatase: 71 U/L (ref 39–117)
BUN: 25 mg/dL — ABNORMAL HIGH (ref 6–23)
CO2: 25 mEq/L (ref 19–32)
Calcium: 10 mg/dL (ref 8.4–10.5)
Chloride: 86 mEq/L — ABNORMAL LOW (ref 96–112)
Creatinine, Ser: 1.89 mg/dL — ABNORMAL HIGH (ref 0.40–1.20)
GFR: 22.63 mL/min — ABNORMAL LOW (ref >60.00)
Glucose, Bld: 103 mg/dL — ABNORMAL HIGH (ref 70–99)
Potassium: 5.1 mEq/L (ref 3.5–5.1)
Sodium: 122 mEq/L — ABNORMAL LOW (ref 135–145)
Total Bilirubin: 0.5 mg/dL (ref 0.2–1.2)
Total Protein: 7.1 g/dL (ref 6.0–8.3)

## 2021-09-14 LAB — CBC WITH DIFFERENTIAL/PLATELET
Basophils Absolute: 0 10*3/uL (ref 0.0–0.1)
Basophils Relative: 0.6 % (ref 0.0–3.0)
Eosinophils Absolute: 0.2 10*3/uL (ref 0.0–0.7)
Eosinophils Relative: 2.1 % (ref 0.0–5.0)
HCT: 35 % — ABNORMAL LOW (ref 36.0–46.0)
Hemoglobin: 11.7 g/dL — ABNORMAL LOW (ref 12.0–15.0)
Lymphocytes Relative: 22.4 % (ref 12.0–46.0)
Lymphs Abs: 1.7 10*3/uL (ref 0.7–4.0)
MCHC: 33.4 g/dL (ref 30.0–36.0)
MCV: 95 fl (ref 78.0–100.0)
Monocytes Absolute: 0.7 10*3/uL (ref 0.1–1.0)
Monocytes Relative: 9.3 % (ref 3.0–12.0)
Neutro Abs: 4.8 10*3/uL (ref 1.4–7.7)
Neutrophils Relative %: 65.6 % (ref 43.0–77.0)
Platelets: 412 10*3/uL — ABNORMAL HIGH (ref 150.0–400.0)
RBC: 3.69 Mil/uL — ABNORMAL LOW (ref 3.87–5.11)
RDW: 12.3 % (ref 11.5–15.5)
WBC: 7.4 10*3/uL (ref 4.0–10.5)

## 2021-09-14 NOTE — Telephone Encounter (Signed)
Patient was seen in office yesterday and order for physical therapy was placed by [facility].   Patient son calling in and states they will not be able to take Patient in for physical therapy. Asking if the order could be changed to [place]  where she lives or to a home health agency?   Please advise

## 2021-09-15 ENCOUNTER — Telehealth: Payer: Self-pay

## 2021-09-15 NOTE — Telephone Encounter (Signed)
Patients son was instructed to call EMS  to go to ED to be evaluated. They said they would comply.

## 2021-09-16 ENCOUNTER — Other Ambulatory Visit: Payer: Self-pay | Admitting: Family

## 2021-09-16 DIAGNOSIS — M6281 Muscle weakness (generalized): Secondary | ICD-10-CM

## 2021-09-16 NOTE — Progress Notes (Signed)
Acute Office Visit  Subjective:    Patient ID: Pamela Paul, female    DOB: 02-Dec-1926, 87 y.o.   MRN: [id number]  Chief Complaint  Patient presents with   Cough    Pt states shes been having a dry cough that has been going on for a little over a month.Pt states she has had no other symptoms other than a cough. Pt states she was covid tested    Knee Pain    Pt states shes been having problems with her left knee locking and having trouble getting it to bend and go back to normal    HPI Patient is in today with c/o fatigue, decreased energy over the past several months. She has had a cough that feels like a tickle in her throat. Denies any congestion.  She reports being on Bactrim twice a day after having a lumpectomy and the wound became infected. Also reports her left knee locking up. She had a total knee replacement 35 year ago. She has difficulty ambulating short distances due to the pain. Reports a cramping pain that is a 4/10. She would like to try physical therapy.  Past Medical History:  Diagnosis Date   CKD (chronic kidney disease) stage 3, GFR 30-59 ml/min (HCC)    Heart disease    mitral valvular   Hyperlipidemia    Hypertension    Hypothyroidism    Spinal stenosis of lumbar region 2006   s/p surgery    Past Surgical History:  Procedure Laterality Date   ABDOMINAL HYSTERECTOMY     bilateral cataract surgery     BREAST LUMPECTOMY Right 07/22/2021   CHOLECYSTECTOMY  12/04/1978   FRACTURE SURGERY Left 12/29/2018   FEMUR   JOINT REPLACEMENT     Bilateral knee   LAPAROSCOPIC SIGMOID COLECTOMY  12/04/2009   secondary to benign mass   MELANOMA EXCISION WITH SENTINEL LYMPH NODE BIOPSY Right 07/22/2021   Procedure: MELANOMA EXCISION WITH SENTINEL LYMPH NODE BIOPSY;  Surgeon: Herbert Pun, MD;  Location: [facility];  Service: General;  Laterality: Right;   PART [place] Right 07/22/2021   Procedure: PART  MASTECTOMY,RADIO FREQUENCY LOCALIZER,AXILLARY SENTINEL NODE BIOPSY;  Surgeon: Herbert Pun, MD;  Location: [facility];  Service: General;  Laterality: Right;   REPLACEMENT TOTAL KNEE BILATERAL     SMALL INTESTINE SURGERY  12/04/2008   for SBO   SPINE SURGERY  12/04/2004   [facility]   tendon repair     achille heall, left     Family History  Problem Relation Age of Onset   Arthritis Mother    Stroke Mother    Hyperlipidemia Father    Cancer Father    Heart disease Paternal Aunt     Social History   Socioeconomic History   Marital status: Widowed    Spouse name: Not on file   Number of children: Not on file   Years of education: Not on file   Highest education level: Not on file  Occupational History   Not on file  Tobacco Use   Smoking status: Former    Types: Cigarettes    Quit date: 03/15/1961    Years since quitting: 60.5   Smokeless tobacco: Never   Tobacco comments:    Quit many years ago.  Vaping Use   Vaping Use: Never used  Substance and Sexual Activity   Alcohol use: Not Currently   Drug use: No   Sexual activity: Never  Other Topics Concern  Not on file  Social History Narrative   [facility]   Social Determinants of Health   Financial Resource Strain: Low Risk    Difficulty of Paying Living Expenses: Not hard at all  Food Insecurity: No Food Insecurity   Worried About Charity fundraiser in the Last Year: Never true   Arboriculturist in the Last Year: Never true  Transportation Needs: No Transportation Needs   Lack of Transportation (Medical): No   Lack of Transportation (Non-Medical): No  Physical Activity: Sufficiently Active   Days of Exercise per Week: 5 days   Minutes of Exercise per Session: 60 min  Stress: No Stress Concern Present   Feeling of Stress : Not at all  Social Connections: Unknown   Frequency of Communication with Friends and Family: More than three times a week   Frequency of Social Gatherings with Friends  and Family: Twice a week   Attends Religious Services: Not on Electrical engineer or Organizations: Yes   Attends Archivist Meetings: Not on file   Marital Status: Widowed  Human resources officer Violence: Not At Risk   Fear of Current or Ex-Partner: No   Emotionally Abused: No   Physically Abused: No   Sexually Abused: No    Outpatient Medications Prior to Visit  Medication Sig Dispense Refill   anastrozole (ARIMIDEX) 1 MG tablet Take 1 tablet (1 mg total) by mouth daily. 30 tablet 3   Ascorbic Acid (VITAMIN C) 1000 MG tablet Take 1,000 mg by mouth daily.     Calcium Carbonate-Vitamin D3 600-400 MG-UNIT TABS Take 1 tablet by mouth 2 (two) times daily.     levothyroxine (SYNTHROID) 50 MCG tablet TAKE 1 TABLET BY MOUTH  DAILY 90 tablet 3   Multiple Vitamins-Minerals (PRESERVISION AREDS PO) Take 1 capsule by mouth 2 (two) times daily.     sulfamethoxazole-trimethoprim (BACTRIM DS) 800-160 MG tablet Take 1 tablet by mouth 2 (two) times daily.     telmisartan (MICARDIS) 20 MG tablet TAKE 1 TABLET BY MOUTH DAILY 90 tablet 3   Turmeric 400 MG CAPS Take 400 mg by mouth daily.     No facility-administered medications prior to visit.    No Known Allergies  Review of Systems  Constitutional: Negative.   HENT:  Positive for postnasal drip.   Respiratory: Negative.    Cardiovascular: Negative.   Musculoskeletal:  Positive for arthralgias.       Left hip pain  Skin: Negative.   Allergic/Immunologic: Negative.   Neurological: Negative.   Psychiatric/Behavioral: Negative.    All other systems reviewed and are negative.     Objective:    Physical Exam Vitals and nursing note reviewed.  Constitutional:      Appearance: Normal appearance.  HENT:     Right Ear: Tympanic membrane and ear canal normal.     Left Ear: Tympanic membrane and ear canal normal.     Nose: No congestion.  Cardiovascular:     Rate and Rhythm: Normal rate and regular rhythm.  Pulmonary:      Effort: Pulmonary effort is normal.     Breath sounds: Normal breath sounds.  Abdominal:     General: Abdomen is flat.     Palpations: Abdomen is soft.  Musculoskeletal:        General: Normal range of motion.     Cervical back: Normal range of motion and neck supple.     Comments: Left knee crepitus  Skin:    General: Skin is warm and dry.  Neurological:     General: No focal deficit present.     Mental Status: She is alert and oriented to person, place, and time.  Psychiatric:        Mood and Affect: Mood normal.        Behavior: Behavior normal.    BP 136/78   Pulse 75   Temp (!) 95.7 F (35.4 C)   Ht 5' 5.98" (1.676 m)   Wt 186 lb 12.8 oz (84.7 kg)   SpO2 98%   BMI 30.16 kg/m  Wt Readings from Last 3 Encounters:  09/13/21 186 lb 12.8 oz (84.7 kg)  08/17/21 188 lb 12.8 oz (85.6 kg)  08/02/21 185 lb (83.9 kg)    There are no preventive care reminders to display for this patient.  There are no preventive care reminders to display for this patient.   Lab Results  Component Value Date   TSH 3.97 08/17/2021   Lab Results  Component Value Date   WBC 7.4 09/13/2021   HGB 11.7 (L) 09/13/2021   HCT 35.0 (L) 09/13/2021   MCV 95.0 09/13/2021   PLT 412.0 (H) 09/13/2021   Lab Results  Component Value Date   NA 122 (L) 09/13/2021   K 5.1 09/13/2021   CO2 25 09/13/2021   GLUCOSE 103 (H) 09/13/2021   BUN 25 (H) 09/13/2021   CREATININE 1.89 (H) 09/13/2021   BILITOT 0.5 09/13/2021   ALKPHOS 71 09/13/2021   AST 30 09/13/2021   ALT 23 09/13/2021   PROT 7.1 09/13/2021   ALBUMIN 4.1 09/13/2021   CALCIUM 10.0 09/13/2021   ANIONGAP 13 07/19/2021   GFR 22.63 (L) 09/13/2021   Lab Results  Component Value Date   CHOL 213 (H) 08/17/2021   Lab Results  Component Value Date   HDL 45 (L) 08/17/2021   Lab Results  Component Value Date   LDLCALC 114 (H) 08/17/2021   Lab Results  Component Value Date   TRIG 377 (H) 08/17/2021   Lab Results  Component Value Date    CHOLHDL 4.7 08/17/2021   Lab Results  Component Value Date   HGBA1C 5.8 03/08/2020       Assessment & Plan:   Problem List Items Addressed This Visit   None Visit Diagnoses     Fatigue, unspecified type    -  Primary   Relevant Orders   CBC w/Diff (Completed)   Comp Met (CMET) (Completed)   Ambulatory referral to Physical Therapy   PND (post-nasal drip)       Relevant Orders   CBC w/Diff (Completed)   Acute pain of left knee       Relevant Orders   Comp Met (CMET) (Completed)   Muscle cramping       Relevant Orders   Comp Met (CMET) (Completed)   Muscle weakness (generalized)       Relevant Orders   Ambulatory referral to Physical Therapy        Meds ordered this encounter  Medications   fexofenadine (ALLEGRA ALLERGY) 180 MG tablet    Sig: Take 1 tablet (180 mg total) by mouth daily.    Dispense:  30 tablet    Refill:  1   Allegra to help with post-nasal drip. Physical therapy to help with mobility and strength. Call the office if symptoms worsen or persist. Recheck as scheduled and as needed  Kennyth Arnold, FNP

## 2021-09-16 NOTE — Telephone Encounter (Signed)
Fax [phone] attention Mechele Dawley  Nurse with [facility] calling in to check on status of change of order. They would like the physical therapy order faxed to the number and attention above.

## 2021-09-19 NOTE — Telephone Encounter (Signed)
noted 

## 2021-09-20 ENCOUNTER — Ambulatory Visit: Payer: Medicare HMO | Admitting: Family

## 2021-09-20 DIAGNOSIS — C50919 Malignant neoplasm of unspecified site of unspecified female breast: Secondary | ICD-10-CM | POA: Diagnosis not present

## 2021-09-20 DIAGNOSIS — I10 Essential (primary) hypertension: Secondary | ICD-10-CM | POA: Diagnosis not present

## 2021-09-20 DIAGNOSIS — N184 Chronic kidney disease, stage 4 (severe): Secondary | ICD-10-CM

## 2021-09-20 DIAGNOSIS — R531 Weakness: Secondary | ICD-10-CM | POA: Diagnosis not present

## 2021-09-20 DIAGNOSIS — E871 Hypo-osmolality and hyponatremia: Secondary | ICD-10-CM | POA: Diagnosis not present

## 2021-09-21 DIAGNOSIS — G8929 Other chronic pain: Secondary | ICD-10-CM | POA: Insufficient documentation

## 2021-09-21 DIAGNOSIS — M25562 Pain in left knee: Secondary | ICD-10-CM | POA: Insufficient documentation

## 2021-09-21 NOTE — Assessment & Plan Note (Signed)
Left knee is locking.  remote TKR.  Home PT referral in progress

## 2021-09-21 NOTE — Addendum Note (Signed)
Addended by: [name] on: 09/21/2021 07:17 PM   Modules accepted: Orders

## 2021-09-22 NOTE — Addendum Note (Signed)
Addended by: [name] on: 09/22/2021 01:38 PM   Modules accepted: Orders

## 2021-09-27 ENCOUNTER — Ambulatory Visit: Payer: Medicare HMO | Admitting: Physical Therapy

## 2021-10-04 ENCOUNTER — Telehealth: Payer: Self-pay

## 2021-10-04 DIAGNOSIS — R5383 Other fatigue: Secondary | ICD-10-CM

## 2021-10-04 NOTE — Telephone Encounter (Signed)
-----   Message from Kennyth Arnold, [place] sent at 09/27/2021  3:26 PM EDT ----- yes ----- Message ----- From: Baron Hamper, CMA Sent: 09/27/2021   9:32 AM EDT To: Kennyth Arnold, FNP  To clarify you wanted pt to come back and repeat these labs after 3 days?

## 2021-10-04 NOTE — Progress Notes (Signed)
Patient is currently in a home care facility. Her labs may need to be order to [facility] so her labs can be drawn at facility.

## 2021-10-06 ENCOUNTER — Telehealth: Payer: Self-pay | Admitting: Oncology

## 2021-10-06 NOTE — Telephone Encounter (Signed)
I called the son and he said that the pt. Took bactrim for infection and had bad reaction and she told her son she does not want to even try the anastrozole. He says she is 45 and he is going to make her due anything she does not want to do. He feels that she was scared to try something else and she said her age she does not want it. They have a video vist 11/28- he wants to know if they need that appt. I told him that I will let Pamela Paul know and call him back tom. She still will probably need to see her at some time to monitor her status with breast cancer. He is agreeable to this.

## 2021-10-06 NOTE — Telephone Encounter (Signed)
Son called in to cancel pt appt for 11-28. Pt doesn't want to continue with  treatment. Call son at [phone]

## 2021-10-07 ENCOUNTER — Telehealth: Payer: Self-pay | Admitting: *Deleted

## 2021-10-07 NOTE — Telephone Encounter (Signed)
Ok no anastrozole. See me in 3 months in person no labs

## 2021-10-07 NOTE — Telephone Encounter (Signed)
I called the son back and said that it is ok if she does not want to take anastrozole. Will need to see md in 3 months. Son agreeable to 01/10/2022  at 10 am.

## 2021-10-21 ENCOUNTER — Encounter: Payer: Self-pay | Admitting: Internal Medicine

## 2021-10-24 ENCOUNTER — Ambulatory Visit: Payer: Medicare HMO | Admitting: Internal Medicine

## 2021-10-31 ENCOUNTER — Encounter: Payer: Self-pay | Admitting: Internal Medicine

## 2021-10-31 ENCOUNTER — Telehealth: Payer: Medicare HMO | Admitting: Oncology

## 2021-10-31 ENCOUNTER — Ambulatory Visit: Payer: Medicare HMO | Admitting: Internal Medicine

## 2021-10-31 DIAGNOSIS — Z7189 Other specified counseling: Secondary | ICD-10-CM | POA: Diagnosis not present

## 2021-10-31 DIAGNOSIS — E871 Hypo-osmolality and hyponatremia: Secondary | ICD-10-CM | POA: Diagnosis not present

## 2021-10-31 DIAGNOSIS — R6883 Chills (without fever): Secondary | ICD-10-CM | POA: Diagnosis not present

## 2021-10-31 DIAGNOSIS — I10 Essential (primary) hypertension: Secondary | ICD-10-CM

## 2021-10-31 DIAGNOSIS — N184 Chronic kidney disease, stage 4 (severe): Secondary | ICD-10-CM

## 2021-10-31 DIAGNOSIS — E039 Hypothyroidism, unspecified: Secondary | ICD-10-CM

## 2021-10-31 NOTE — Assessment & Plan Note (Signed)
telmisartan stopped due to this Will recheck labs next week

## 2021-10-31 NOTE — Progress Notes (Signed)
Subjective:    Patient ID: Pamela Paul, female    DOB: 11/11/1926, 87 y.o.   MRN: [id number]  HPI Visit in assisted living apartment for follow up after rehab stay Transferring to my care for in facility visits Reviewed status with Luellen Pucker RN  Has some chills now--didn't sleep well last night Having some headache and pain in eyes Some cough last night---nothing new and no issues now No myalgias No SOB  Was on arimidex---but stopped it "It threw me for a loop" Was in health care due to med reaction--unclear if septra for infected seroma vs the arimidex  Takes thyroid daily Appetite is fine Weight stable  Known hyponatremia--last 122 Telmisartan stopped  BP has been okay since then  No chest pain  Last GFR 25 (had been down as low as 22)  Some arthritis in shoulder and fingers Not constant No meds---rare tylenol only  Current Outpatient Medications on File Prior to Visit  Medication Sig Dispense Refill   Calcium Carbonate-Vitamin D3 600-400 MG-UNIT TABS Take 1 tablet by mouth 2 (two) times daily.     levothyroxine (SYNTHROID) 50 MCG tablet TAKE 1 TABLET BY MOUTH  DAILY 90 tablet 3   Multiple Vitamins-Minerals (PRESERVISION AREDS PO) Take 1 capsule by mouth 2 (two) times daily.     Turmeric 400 MG CAPS Take 400 mg by mouth daily.     No current facility-administered medications on file prior to visit.    No Known Allergies  Past Medical History:  Diagnosis Date   Breast cancer ([place])    CKD (chronic kidney disease) stage 3, GFR 30-59 ml/min (HCC)    Heart disease    mitral valvular   Hyperlipidemia    Hyperparathyroidism, secondary ([place])    Hypertension    Hypothyroidism    Spinal stenosis of lumbar region 2006   s/p surgery    Past Surgical History:  Procedure Laterality Date   ABDOMINAL HYSTERECTOMY     bilateral cataract surgery     BREAST LUMPECTOMY Right 07/22/2021   CHOLECYSTECTOMY  12/04/1978   FRACTURE SURGERY Left 12/29/2018   FEMUR   JOINT  REPLACEMENT     Bilateral knee   LAPAROSCOPIC SIGMOID COLECTOMY  12/04/2009   secondary to benign mass   MELANOMA EXCISION WITH SENTINEL LYMPH NODE BIOPSY Right 07/22/2021   Procedure: MELANOMA EXCISION WITH SENTINEL LYMPH NODE BIOPSY;  Surgeon: Herbert Pun, MD;  Location: [facility];  Service: General;  Laterality: Right;   PART [place] Right 07/22/2021   Procedure: PART MASTECTOMY,RADIO FREQUENCY LOCALIZER,AXILLARY SENTINEL NODE BIOPSY;  Surgeon: Herbert Pun, MD;  Location: [facility];  Service: General;  Laterality: Right;   REPLACEMENT TOTAL KNEE BILATERAL     SMALL INTESTINE SURGERY  12/04/2008   for SBO   SPINE SURGERY  12/04/2004   [facility], [facility]   tendon repair     achille heall, left     Family History  Problem Relation Age of Onset   Arthritis Mother    Stroke Mother    Hyperlipidemia Father    Cancer Father    Heart disease Paternal Aunt     Social History   Socioeconomic History   Marital status: Widowed    Spouse name: Not on file   Number of children: 5   Years of education: Not on file   Highest education level: Not on file  Occupational History   Occupation: Preschool Pharmacist, hospital    Comment: Retired  Tobacco Use   Smoking  status: Former    Types: Cigarettes    Quit date: 03/15/1961    Years since quitting: 60.6   Smokeless tobacco: Never   Tobacco comments:    Quit many years ago.  Vaping Use   Vaping Use: Never used  Substance and Sexual Activity   Alcohol use: Not Currently   Drug use: No   Sexual activity: Never  Other Topics Concern   Not on file  Social History Narrative   2 daughters/3 sons      Has living will   Son Shanon Brow is [facility] POA   Has DNR   Would accept hospital care   Would consider tube feeds--but not if persistent cognitive unawareness   Social Determinants of Health   Financial Resource Strain: Low Risk    Difficulty of Paying Living Expenses:  Not hard at all  Food Insecurity: No Food Insecurity   Worried About Charity fundraiser in the Last Year: Never true   [place] in the Last Year: Never true  Transportation Needs: No Transportation Needs   Lack of Transportation (Medical): No   Lack of Transportation (Non-Medical): No  Physical Activity: Sufficiently Active   Days of Exercise per Week: 5 days   Minutes of Exercise per Session: 60 min  Stress: No Stress Concern Present   Feeling of Stress : Not at all  Social Connections: Unknown   Frequency of Communication with Friends and Family: More than three times a week   Frequency of Social Gatherings with Friends and Family: Twice a week   Attends Religious Services: Not on Electrical engineer or Organizations: Yes   Attends Archivist Meetings: Not on file   Marital Status: Widowed  Human resources officer Violence: Not At Risk   Fear of Current or Ex-Partner: No   Emotionally Abused: No   Physically Abused: No   Sexually Abused: No    Review of Systems Feels she has adjusted well to [place] Has met some people here Generally sleeps okay     Objective:   Physical Exam Constitutional:      Appearance: Normal appearance.     Comments: Feels warm to touch---NAD  Cardiovascular:     Rate and Rhythm: Normal rate and regular rhythm.     Heart sounds: No murmur heard.   No gallop.  Pulmonary:     Effort: Pulmonary effort is normal.     Breath sounds: Normal breath sounds. No wheezing or rales.  Abdominal:     Palpations: Abdomen is soft.     Tenderness: There is no abdominal tenderness.  Musculoskeletal:     Cervical back: Neck supple.     Right lower leg: No edema.     Left lower leg: No edema.  Lymphadenopathy:     Cervical: No cervical adenopathy.  Neurological:     Mental Status: She is alert.  Psychiatric:        Mood and Affect: Mood normal.        Behavior: Behavior normal.           Assessment & Plan:

## 2021-10-31 NOTE — Assessment & Plan Note (Signed)
Recent lumpectomy with secondary seroma Was on arimidex but chose to stop it---I agree with this decision

## 2021-10-31 NOTE — Assessment & Plan Note (Signed)
Off telmisartan due to hyponatremia Will recheck labs next week

## 2021-10-31 NOTE — Assessment & Plan Note (Signed)
Seems euthyroid TSH normal in September

## 2021-10-31 NOTE — Assessment & Plan Note (Signed)
And other mild symptoms suggestive of mild influenza Did have vaccine Not particularly ill--though I think she now has a fever  Will give tylenol Keep isolated for now No antivirals due to mild illness--and recent drug reactions

## 2021-10-31 NOTE — Assessment & Plan Note (Signed)
BP Readings from Last 3 Encounters:  10/31/21 122/69  09/13/21 136/78  08/17/21 100/60   BP fine off Rx Will stay off meds

## 2021-11-01 ENCOUNTER — Other Ambulatory Visit: Payer: Self-pay | Admitting: Internal Medicine

## 2021-11-02 ENCOUNTER — Other Ambulatory Visit: Payer: Self-pay | Admitting: Internal Medicine

## 2021-11-02 MED ORDER — LEVOTHYROXINE SODIUM 50 MCG PO TABS: ORAL_TABLET | ORAL | 11 refills | Status: AC

## 2021-11-03 ENCOUNTER — Telehealth: Payer: Self-pay | Admitting: Internal Medicine

## 2021-11-03 NOTE — Telephone Encounter (Signed)
Pt son Genesys Coggeshall called in stating that Pt was taken off medication (telmisartan (MICARDIS) 20 MG). Pt son stated that [facility] took her off the medication. Pt son just wanted to advise Dr. Silvio Pate of the situation. Pt requesting callback

## 2021-11-03 NOTE — Telephone Encounter (Signed)
Spoke to pt's son. Advised him of Dr Alla German note. He was asking how often does her BP get checked and other questions I cannot answer. I advised him to contact [facility] and ask Luellen Pucker or someone the questions he is asking about testing frequency.

## 2021-11-10 ENCOUNTER — Encounter: Payer: Self-pay | Admitting: Internal Medicine

## 2022-01-10 ENCOUNTER — Encounter: Payer: Self-pay | Admitting: Oncology

## 2022-01-10 ENCOUNTER — Other Ambulatory Visit: Payer: Self-pay

## 2022-01-10 ENCOUNTER — Inpatient Hospital Stay: Payer: Medicare HMO | Attending: Oncology | Admitting: Oncology

## 2022-01-10 VITALS — BP 132/73 | HR 79 | Temp 97.8°F | Resp 16 | Ht 65.0 in | Wt 184.0 lb

## 2022-01-10 DIAGNOSIS — Z170 Estrogen receptor positive status [ER+]: Secondary | ICD-10-CM | POA: Insufficient documentation

## 2022-01-10 DIAGNOSIS — Z853 Personal history of malignant neoplasm of breast: Secondary | ICD-10-CM | POA: Diagnosis not present

## 2022-01-10 DIAGNOSIS — Z881 Allergy status to other antibiotic agents status: Secondary | ICD-10-CM | POA: Diagnosis not present

## 2022-01-10 DIAGNOSIS — E785 Hyperlipidemia, unspecified: Secondary | ICD-10-CM | POA: Diagnosis not present

## 2022-01-10 DIAGNOSIS — Z85820 Personal history of malignant melanoma of skin: Secondary | ICD-10-CM | POA: Diagnosis not present

## 2022-01-10 DIAGNOSIS — C50911 Malignant neoplasm of unspecified site of right female breast: Secondary | ICD-10-CM | POA: Diagnosis not present

## 2022-01-10 DIAGNOSIS — Z9049 Acquired absence of other specified parts of digestive tract: Secondary | ICD-10-CM | POA: Diagnosis not present

## 2022-01-10 DIAGNOSIS — Z79899 Other long term (current) drug therapy: Secondary | ICD-10-CM | POA: Insufficient documentation

## 2022-01-10 DIAGNOSIS — Z87891 Personal history of nicotine dependence: Secondary | ICD-10-CM | POA: Insufficient documentation

## 2022-01-10 DIAGNOSIS — Z8249 Family history of ischemic heart disease and other diseases of the circulatory system: Secondary | ICD-10-CM | POA: Diagnosis not present

## 2022-01-10 DIAGNOSIS — Z823 Family history of stroke: Secondary | ICD-10-CM | POA: Diagnosis not present

## 2022-01-10 DIAGNOSIS — N183 Chronic kidney disease, stage 3 unspecified: Secondary | ICD-10-CM | POA: Diagnosis not present

## 2022-01-10 DIAGNOSIS — I129 Hypertensive chronic kidney disease with stage 1 through stage 4 chronic kidney disease, or unspecified chronic kidney disease: Secondary | ICD-10-CM | POA: Insufficient documentation

## 2022-01-10 DIAGNOSIS — Z809 Family history of malignant neoplasm, unspecified: Secondary | ICD-10-CM | POA: Diagnosis not present

## 2022-01-10 DIAGNOSIS — Z8261 Family history of arthritis: Secondary | ICD-10-CM | POA: Insufficient documentation

## 2022-01-10 DIAGNOSIS — Z08 Encounter for follow-up examination after completed treatment for malignant neoplasm: Secondary | ICD-10-CM | POA: Diagnosis not present

## 2022-01-10 DIAGNOSIS — Z66 Do not resuscitate: Secondary | ICD-10-CM | POA: Insufficient documentation

## 2022-01-10 DIAGNOSIS — E039 Hypothyroidism, unspecified: Secondary | ICD-10-CM | POA: Diagnosis not present

## 2022-01-10 DIAGNOSIS — Z8349 Family history of other endocrine, nutritional and metabolic diseases: Secondary | ICD-10-CM | POA: Insufficient documentation

## 2022-01-10 NOTE — Progress Notes (Signed)
Hematology/Oncology Consult note [facility]  [phone] [phone]  Patient Care Team: Venia Carbon, MD as PCP - General (Internal Medicine) Theodore Demark, RN as Oncology Nurse Navigator   Name of the patient: Pamela Paul  [id number]  1926-02-09   Date of visit: 01/10/22  Diagnosis-stage I right breast cancer  Chief complaint/ Reason for visit-routine follow-up of breast cancer  Heme/Onc history: patient is a 88 year old female with a past medical history significant for hyperlipidemia, CKD stage III hypertension among other medical problems.  She self palpated a breast lump in her right breast which prompted a bilateral diagnostic mammogram Which showed a 3.7 cm mass in the upper outer quadrant of the right breast and was roughly 2.2 cm on ultrasound.  No abnormal adenopathy noted in the right axilla.  This was biopsied and was consistent with invasive mammary carcinoma grade 2 ER greater than 90% positive PR weakly +1 to 10% and HER2 equivocal by IHC.     FISH testing put her in group 4 Number of observers: 2  Number of invasive tumor cells counted: 40  Dual probe assay  Average number of HER2 signals per cell: 4.1  Average number of CEP17 signals per cell: 3.3  HER2/CEP17 ratio: 1.25   FINAL HER2 negative   Final lumpectomy pathology on 07/22/2021 showed invasive mammary carcinoma 2.9 x 2.9 x 1.7 cm grade 3 with negative margins.  Lymphovascular invasion present.  2 sentinel lymph nodes negative for malignancy.Repeat Her2 biomarker testing on the final pathology showed HER2 IHC deemed incomplete membranous staining in the majority of cells.  Complete membrane staining identified in less than 10% of the total neoplastic cells.  Best classified as +1   Bone density scan on 08/11/2021 showed a T score of -1 at the right hip.  Patient decided not to do any adjuvant radiation treatment or hormone therapy  Interval history-patient  lives at [facility] and is overall feeling well.  She did have a bad reaction to Bactrim and following that she decided not to attempt taking anastrozole as well.  ECOG PS- 1 Pain scale- 0   Review of systems- Review of Systems  Constitutional:  Negative for chills, fever, malaise/fatigue and weight loss.  HENT:  Negative for congestion, ear discharge and nosebleeds.   Eyes:  Negative for blurred vision.  Respiratory:  Negative for cough, hemoptysis, sputum production, shortness of breath and wheezing.   Cardiovascular:  Negative for chest pain, palpitations, orthopnea and claudication.  Gastrointestinal:  Negative for abdominal pain, blood in stool, constipation, diarrhea, heartburn, melena, nausea and vomiting.  Genitourinary:  Negative for dysuria, flank pain, frequency, hematuria and urgency.  Musculoskeletal:  Negative for back pain, joint pain and myalgias.  Skin:  Negative for rash.  Neurological:  Negative for dizziness, tingling, focal weakness, seizures, weakness and headaches.  Endo/Heme/Allergies:  Does not bruise/bleed easily.  Psychiatric/Behavioral:  Negative for depression and suicidal ideas. The patient does not have insomnia.       Allergies  Allergen Reactions   Bactrim [Sulfamethoxazole-Trimethoprim] Nausea And Vomiting    Lethargic and weakness     Past Medical History:  Diagnosis Date   Breast cancer ([place])    CKD (chronic kidney disease) stage 3, GFR 30-59 ml/min (HCC)    Heart disease    mitral valvular   Hyperlipidemia    Hyperparathyroidism, secondary ([place])    Hypertension    Hypothyroidism    Spinal stenosis of lumbar region 2006  s/p surgery     Past Surgical History:  Procedure Laterality Date   ABDOMINAL HYSTERECTOMY     bilateral cataract surgery     BREAST LUMPECTOMY Right 07/22/2021   CHOLECYSTECTOMY  12/04/1978   FRACTURE SURGERY Left 12/29/2018   FEMUR   JOINT REPLACEMENT     Bilateral knee   LAPAROSCOPIC SIGMOID COLECTOMY   12/04/2009   secondary to benign mass   MELANOMA EXCISION WITH SENTINEL LYMPH NODE BIOPSY Right 07/22/2021   Procedure: MELANOMA EXCISION WITH SENTINEL LYMPH NODE BIOPSY;  Surgeon: Herbert Pun, MD;  Location: [facility] ORS;  Service: General;  Laterality: Right;   PART [place] NODE BIOPSY Right 07/22/2021   Procedure: PART MASTECTOMY,RADIO FREQUENCY LOCALIZER,AXILLARY SENTINEL NODE BIOPSY;  Surgeon: Herbert Pun, MD;  Location: [facility] ORS;  Service: General;  Laterality: Right;   REPLACEMENT TOTAL KNEE BILATERAL     SMALL INTESTINE SURGERY  12/04/2008   for SBO   SPINE SURGERY  12/04/2004   [place], [facility]   tendon repair     achille heall, left     Social History   Socioeconomic History   Marital status: Widowed    Spouse name: Not on file   Number of children: 5   Years of education: Not on file   Highest education level: Not on file  Occupational History   Occupation: Preschool teacher/director    Comment: Retired  Tobacco Use   Smoking status: Former    Types: Cigarettes    Quit date: 03/15/1961    Years since quitting: 60.8   Smokeless tobacco: Never   Tobacco comments:    Quit many years ago.  Vaping Use   Vaping Use: Never used  Substance and Sexual Activity   Alcohol use: Not Currently   Drug use: No   Sexual activity: Never  Other Topics Concern   Not on file  Social History Narrative   2 daughters/3 sons      Has living will   Son Shanon Brow is [facility] POA   Has DNR   Would accept hospital care   Would consider tube feeds--but not if persistent cognitive unawareness   Social Determinants of Health   Financial Resource Strain: Low Risk    Difficulty of Paying Living Expenses: Not hard at all  Food Insecurity: No Food Insecurity   Worried About Charity fundraiser in the Last Year: Never true   [place] in the Last Year: Never true  Transportation Needs: No Transportation Needs   Lack of  Transportation (Medical): No   Lack of Transportation (Non-Medical): No  Physical Activity: Sufficiently Active   Days of Exercise per Week: 5 days   Minutes of Exercise per Session: 60 min  Stress: No Stress Concern Present   Feeling of Stress : Not at all  Social Connections: Unknown   Frequency of Communication with Friends and Family: More than three times a week   Frequency of Social Gatherings with Friends and Family: Twice a week   Attends Religious Services: Not on Electrical engineer or Organizations: Yes   Attends Archivist Meetings: Not on file   Marital Status: Widowed  Human resources officer Violence: Not At Risk   Fear of Current or Ex-Partner: No   Emotionally Abused: No   Physically Abused: No   Sexually Abused: No    Family History  Problem Relation Age of Onset   Arthritis Mother    Stroke Mother  Hyperlipidemia Father    Cancer Father    Heart disease Paternal Aunt      Current Outpatient Medications:    Bacillus Coagulans-Inulin (ALIGN PREBIOTIC-PROBIOTIC PO), Take by mouth., Disp: , Rfl:    Calcium Carbonate-Vitamin D3 600-400 MG-UNIT TABS, Take 1 tablet by mouth 2 (two) times daily., Disp: , Rfl:    levothyroxine (SYNTHROID) 50 MCG tablet, TAKE 1 TABLET EVERY DAY ON EMPTY STOMACHWITH A GLASS OF WATER AT LEAST 30-60 MINBEFORE BREAKFAST, Disp: 30 tablet, Rfl: 11   Multiple Vitamins-Minerals (PRESERVISION AREDS PO), Take 1 capsule by mouth 2 (two) times daily., Disp: , Rfl:   Physical exam:  Vitals:   01/10/22 1010  BP: 132/73  Pulse: 79  Resp: 16  Temp: 97.8 F (36.6 C)  TempSrc: Tympanic  SpO2: 97%  Weight: 184 lb (83.5 kg)  Height: _0  (1.651 m)   Physical Exam Constitutional:      General: She is not in acute distress. Cardiovascular:     Rate and Rhythm: Normal rate and regular rhythm.     Heart sounds: Normal heart sounds.  Pulmonary:     Effort: Pulmonary effort is normal.     Breath sounds: Normal breath sounds.   Abdominal:     General: Bowel sounds are normal.     Palpations: Abdomen is soft.  Skin:    General: Skin is warm and dry.  Neurological:     Mental Status: She is alert and oriented to person, place, and time.   Breast exam was performed in seated and lying down position. Patient is status post right lumpectomy with a well-healed surgical scar. No evidence of any palpable masses. No evidence of axillary adenopathy. No evidence of any palpable masses or lumps in the left breast. No evidence of leftt axillary adenopathy   CMP Latest Ref Rng & Units 09/13/2021  Glucose 70 - 99 mg/dL 103(H)  BUN 6 - 23 mg/dL 25(H)  Creatinine 0.40 - 1.20 mg/dL 1.89(H)  Sodium 135 - 145 mEq/L 122(L)  Potassium 3.5 - 5.1 mEq/L 5.1  Chloride 96 - 112 mEq/L 86(L)  CO2 19 - 32 mEq/L 25  Calcium 8.4 - 10.5 mg/dL 10.0  Total Protein 6.0 - 8.3 g/dL 7.1  Total Bilirubin 0.2 - 1.2 mg/dL 0.5  Alkaline Phos 39 - 117 U/L 71  AST 0 - 37 U/L 30  ALT 0 - 35 U/L 23   CBC Latest Ref Rng & Units 09/13/2021  WBC 4.0 - 10.5 K/uL 7.4  Hemoglobin 12.0 - 15.0 g/dL 11.7(L)  Hematocrit 36.0 - 46.0 % 35.0(L)  Platelets 150.0 - 400.0 K/uL 412.0(H)     Assessment and plan- Patient is a 88 y.o. female with pathological prognostic stage Ib invasive mammary carcinoma of the right breast pT2 pN0 cM0 ER strongly positive greater than 90%, PR weakly positive and HER2 negative. She is here for routine f/u  Clinically patient is doing well with no concerning signs and symptoms of recurrence based on today's exam.  We discussed pros and cons of doing mammogram at her age.  Mammogram can sometimes pick up small breast masses which can lead to more biopsies.  Patient and her sons understand and are willing to forego mammograms given her age.  We will continue breast cancer surveillance with yearly breast exams.  She is not currently on any hormone therapy  I will see her back in 1 year   Visit Diagnosis 1. Encounter for follow-up  surveillance of breast cancer  Dr. Randa Evens, MD, MPH [facility] [phone] 01/10/2022 10:42 AM

## 2022-01-20 ENCOUNTER — Encounter: Payer: Self-pay | Admitting: Internal Medicine

## 2022-01-20 ENCOUNTER — Non-Acute Institutional Stay: Payer: Medicare HMO | Admitting: Internal Medicine

## 2022-01-20 ENCOUNTER — Other Ambulatory Visit: Payer: Self-pay

## 2022-01-20 DIAGNOSIS — Z96652 Presence of left artificial knee joint: Secondary | ICD-10-CM

## 2022-01-20 DIAGNOSIS — G8929 Other chronic pain: Secondary | ICD-10-CM

## 2022-01-20 DIAGNOSIS — M25562 Pain in left knee: Secondary | ICD-10-CM | POA: Diagnosis not present

## 2022-01-20 DIAGNOSIS — N184 Chronic kidney disease, stage 4 (severe): Secondary | ICD-10-CM

## 2022-01-20 DIAGNOSIS — I10 Essential (primary) hypertension: Secondary | ICD-10-CM

## 2022-01-20 DIAGNOSIS — E039 Hypothyroidism, unspecified: Secondary | ICD-10-CM

## 2022-01-20 NOTE — Assessment & Plan Note (Signed)
Controlled off meds We will monitor 

## 2022-01-20 NOTE — Progress Notes (Signed)
Subjective:    Patient ID: Pamela Paul, female    DOB: 23-Oct-1926, 88 y.o.   MRN: [id number]  HPI  Resident seen in [address] Reviewed with RN, no new concerns from staff or resident She sleeps well. She walks with a rollater. She is independent with ADL's. Her appetite is good, weight (stable). She denies urinary incontinence. Her bowels are okay. She denies pain, chest pain or SOB. She reports her mood is great.  OA: Mainly in her back, knees  CKD: Her last creatinine was 1.43, GFR 34, 11/2021. She is not currently on an ACEI/ARB.  HTN: Her BP today is 124/59. She is not taking any antihypertensive medications at this time. ECG from 07/2021 reviewed.  Hypothyroidism: She denies any issues on her current does of Levothyroxine.   Review of Systems     Past Medical History:  Diagnosis Date   Breast cancer ([place])    CKD (chronic kidney disease) stage 3, GFR 30-59 ml/min (HCC)    Heart disease    mitral valvular   Hyperlipidemia    Hyperparathyroidism, secondary ([place])    Hypertension    Hypothyroidism    Spinal stenosis of lumbar region 2006   s/p surgery    Current Outpatient Medications  Medication Sig Dispense Refill   Bacillus Coagulans-Inulin (ALIGN PREBIOTIC-PROBIOTIC PO) Take by mouth.     Calcium Carbonate-Vitamin D3 600-400 MG-UNIT TABS Take 1 tablet by mouth 2 (two) times daily.     levothyroxine (SYNTHROID) 50 MCG tablet TAKE 1 TABLET EVERY DAY ON EMPTY STOMACHWITH A GLASS OF WATER AT LEAST 30-60 MINBEFORE BREAKFAST 30 tablet 11   Multiple Vitamins-Minerals (PRESERVISION AREDS PO) Take 1 capsule by mouth 2 (two) times daily.     No current facility-administered medications for this visit.    Allergies  Allergen Reactions   Bactrim [Sulfamethoxazole-Trimethoprim] Nausea And Vomiting    Lethargic and weakness    Family History  Problem Relation Age of Onset   Arthritis Mother    Stroke Mother    Hyperlipidemia Father    Cancer Father    Heart disease  Paternal Aunt     Social History   Socioeconomic History   Marital status: Widowed    Spouse name: Not on file   Number of children: 5   Years of education: Not on file   Highest education level: Not on file  Occupational History   Occupation: Preschool teacher/director    Comment: Retired  Tobacco Use   Smoking status: Former    Types: Cigarettes    Quit date: 03/15/1961    Years since quitting: 60.8   Smokeless tobacco: Never   Tobacco comments:    Quit many years ago.  Vaping Use   Vaping Use: Never used  Substance and Sexual Activity   Alcohol use: Not Currently   Drug use: No   Sexual activity: Never  Other Topics Concern   Not on file  Social History Narrative   2 daughters/3 sons      Has living will   Son Shanon Brow is [facility] POA   Has DNR   Would accept hospital care   Would consider tube feeds--but not if persistent cognitive unawareness   Social Determinants of Health   Financial Resource Strain: Low Risk    Difficulty of Paying Living Expenses: Not hard at all  Food Insecurity: No Food Insecurity   Worried About Charity fundraiser in the Last Year: Never true   [place] in the  Last Year: Never true  Transportation Needs: No Transportation Needs   Lack of Transportation (Medical): No   Lack of Transportation (Non-Medical): No  Physical Activity: Sufficiently Active   Days of Exercise per Week: 5 days   Minutes of Exercise per Session: 60 min  Stress: No Stress Concern Present   Feeling of Stress : Not at all  Social Connections: Unknown   Frequency of Communication with Friends and Family: More than three times a week   Frequency of Social Gatherings with Friends and Family: Twice a week   Attends Religious Services: Not on Electrical engineer or Organizations: Yes   Attends Archivist Meetings: Not on file   Marital Status: Widowed  Human resources officer Violence: Not At Risk   Fear of Current or Ex-Partner: No   Emotionally  Abused: No   Physically Abused: No   Sexually Abused: No     Constitutional: Denies fever, malaise, fatigue, headache or abrupt weight changes.  HEENT: Denies eye pain, eye redness, ear pain, ringing in the ears, wax buildup, runny nose, nasal congestion, bloody nose, or sore throat. Respiratory: Denies difficulty breathing, shortness of breath, cough or sputum production.   Cardiovascular: Denies chest pain, chest tightness, palpitations or swelling in the hands or feet.  Gastrointestinal: Denies abdominal pain, bloating, constipation, diarrhea or blood in the stool.  GU: Denies urgency, frequency, pain with urination, burning sensation, blood in urine, odor or discharge. Musculoskeletal: Pt reports intermittent joint pain, difficulty with gait. Denies decrease in range of motion, muscle pain or joint pain and swelling.  Skin: Denies redness, rashes, lesions or ulcercations.  Neurological: Denies dizziness, difficulty with memory, difficulty with speech or problems with balance and coordination.  Psych: Denies anxiety, depression, SI/HI.  No other specific complaints in a complete review of systems (except as listed in HPI above).  Objective:   Physical Exam  BP (!) 124/59    Pulse 77    Temp 98 F (36.7 C)    Resp 18    Wt 178 lb (80.7 kg)    SpO2 96%    BMI 29.62 kg/m  Wt Readings from Last 3 Encounters:  01/20/22 178 lb (80.7 kg)  01/10/22 184 lb (83.5 kg)  10/31/21 181 lb (82.1 kg)    General: Appears her stated age, in NAD. Skin: Warm, dry and intact.  HEENT: Head: normal shape and size; Eyes:  EOMs intact; Neck:  Neck supple, trachea midline. No masses, lumps present.  Cardiovascular: Normal rate and rhythm. S1,S2 noted.  No murmur, rubs or gallops noted. No JVD or BLE edema.  Pulmonary/Chest: Normal effort and positive vesicular breath sounds. No respiratory distress. No wheezes, rales or ronchi noted.  Abdomen: Soft and nontender. Normal bowel sounds.  Musculoskeletal:  Gait slow and steady with use of rolling walker. Neurological: Alert and oriented.  Psychiatric: Mood and affect normal.     BMET    Component Value Date/Time   NA 122 (L) 09/13/2021 1412   NA 141 03/08/2020 0000   K 5.1 09/13/2021 1412   CL 86 (L) 09/13/2021 1412   CO2 25 09/13/2021 1412   GLUCOSE 103 (H) 09/13/2021 1412   BUN 25 (H) 09/13/2021 1412   BUN 27 (A) 03/08/2020 0000   CREATININE 1.89 (H) 09/13/2021 1412   CALCIUM 10.0 09/13/2021 1412   GFRNONAA 36 (L) 07/19/2021 1435    Lipid Panel     Component Value Date/Time   CHOL 213 (H) 08/17/2021 1042  TRIG 377 (H) 08/17/2021 1042   HDL 45 (L) 08/17/2021 1042   CHOLHDL 4.7 08/17/2021 1042   VLDL 34.4 07/31/2018 0823   LDLCALC 114 (H) 08/17/2021 1042    CBC    Component Value Date/Time   WBC 7.4 09/13/2021 1412   RBC 3.69 (L) 09/13/2021 1412   HGB 11.7 (L) 09/13/2021 1412   HCT 35.0 (L) 09/13/2021 1412   PLT 412.0 (H) 09/13/2021 1412   MCV 95.0 09/13/2021 1412   MCH 32.7 07/19/2021 1435   MCHC 33.4 09/13/2021 1412   RDW 12.3 09/13/2021 1412   LYMPHSABS 1.7 09/13/2021 1412   MONOABS 0.7 09/13/2021 1412   EOSABS 0.2 09/13/2021 1412   BASOSABS 0.0 09/13/2021 1412    Hgb A1C Lab Results  Component Value Date   HGBA1C 5.8 03/08/2020            Assessment & Plan:   Webb Silversmith, NP This visit occurred during the SARS-CoV-2 public health emergency.  Safety protocols were in place, including screening questions prior to the visit, additional usage of staff PPE, and extensive cleaning of exam room while observing appropriate contact time as indicated for disinfecting solutions.

## 2022-01-20 NOTE — Assessment & Plan Note (Signed)
Past Tylenol as needed

## 2022-01-20 NOTE — Assessment & Plan Note (Signed)
Continue Levothyroxine Check free T4 yearly

## 2022-01-20 NOTE — Assessment & Plan Note (Signed)
Not currently on ACEI/ARB BMET yearly

## 2022-03-02 ENCOUNTER — Ambulatory Visit (INDEPENDENT_AMBULATORY_CARE_PROVIDER_SITE_OTHER): Payer: Medicare HMO

## 2022-03-02 VITALS — Ht 65.0 in | Wt 178.0 lb

## 2022-03-02 DIAGNOSIS — Z0000 Encounter for general adult medical examination without abnormal findings: Secondary | ICD-10-CM

## 2022-03-02 NOTE — Progress Notes (Addendum)
Subjective:   Pamela Paul is a 88 y.o. female who presents for Medicare Annual (Subsequent) preventive examination.  Review of Systems    No ROS.  Medicare Wellness Virtual Visit.  Visual/audio telehealth visit, UTA vital signs.   See social history for additional risk factors.   Cardiac Risk Factors include: advanced age (>2men, >45 women)     Objective:    Today's Vitals   03/02/22 1338  Weight: 178 lb (80.7 kg)  Height: 5\' 5"  (1.651 m)   Body mass index is 29.62 kg/m.     03/02/2022    2:00 PM 01/10/2022   10:14 AM 08/18/2021   10:45 AM 07/15/2021    8:53 AM 07/11/2021   11:02 AM 03/01/2021    8:53 AM 02/02/2020    8:50 AM  Advanced Directives  Does Patient Have a Medical Advance Directive? Yes Yes No Yes Yes Yes Yes  Type of Diplomatic Services operational officer Living will;Healthcare Power of [employer] Power of [place];Living will   Healthcare Power of Attorney  Does patient want to make changes to medical advance directive? No - Patient declined    No - Patient declined No - Patient declined No - Patient declined  Copy of Healthcare Power of Attorney in Chart? No - copy requested   No - copy requested   No - copy requested  Would patient like information on creating a medical advance directive?   No - Patient declined        Current Medications (verified) Outpatient Encounter Medications as of 03/02/2022  Medication Sig   Bacillus Coagulans-Inulin (ALIGN PREBIOTIC-PROBIOTIC PO) Take by mouth.   Calcium Carbonate-Vitamin D3 600-400 MG-UNIT TABS Take 1 tablet by mouth 2 (two) times daily.   levothyroxine (SYNTHROID) 50 MCG tablet TAKE 1 TABLET EVERY DAY ON EMPTY STOMACHWITH A GLASS OF WATER AT LEAST 30-60 MINBEFORE BREAKFAST   Multiple Vitamins-Minerals (PRESERVISION AREDS PO) Take 1 capsule by mouth 2 (two) times daily.   No facility-administered encounter medications on file as of 03/02/2022.    Allergies (verified) Bactrim  [sulfamethoxazole-trimethoprim]   History: Past Medical History:  Diagnosis Date   Breast cancer (HCC)    CKD (chronic kidney disease) stage 3, GFR 30-59 ml/min (HCC)    Heart disease    mitral valvular   Hyperlipidemia    Hyperparathyroidism, secondary (HCC)    Hypertension    Hypothyroidism    Spinal stenosis of lumbar region 2006   s/p surgery   Past Surgical History:  Procedure Laterality Date   ABDOMINAL HYSTERECTOMY     bilateral cataract surgery     BREAST LUMPECTOMY Right 07/22/2021   CHOLECYSTECTOMY  12/04/1978   FRACTURE SURGERY Left 12/29/2018   FEMUR   JOINT REPLACEMENT     Bilateral knee   LAPAROSCOPIC SIGMOID COLECTOMY  12/04/2009   secondary to benign mass   MELANOMA EXCISION WITH SENTINEL LYMPH NODE BIOPSY Right 07/22/2021   Procedure: MELANOMA EXCISION WITH SENTINEL LYMPH NODE BIOPSY;  Surgeon: Carolan Shiver, MD;  Location: [facility] ORS;  Service: General;  Laterality: Right;   PART MASTECTOMY,RADIO FREQUENCY LOCALIZER,AXILLARY SENTINEL NODE BIOPSY Right 07/22/2021   Procedure: PART MASTECTOMY,RADIO FREQUENCY LOCALIZER,AXILLARY SENTINEL NODE BIOPSY;  Surgeon: Carolan Shiver, MD;  Location: [facility] ORS;  Service: General;  Laterality: Right;   REPLACEMENT TOTAL KNEE BILATERAL     SMALL INTESTINE SURGERY  12/04/2008   for SBO   SPINE SURGERY  12/04/2004   [facility], [facility]   tendon repair  achille heall, left    Family History  Problem Relation Age of Onset   Arthritis Mother    Stroke Mother    Hyperlipidemia Father    Cancer Father    Heart disease Paternal Aunt    Social History   Socioeconomic History   Marital status: Widowed    Spouse name: Not on file   Number of children: 5   Years of education: Not on file   Highest education level: Not on file  Occupational History   Occupation: Preschool teacher/director    Comment: Retired  Tobacco Use   Smoking status: Former    Types: Cigarettes    Quit date: 03/15/1961    Years since  quitting: 61.0   Smokeless tobacco: Never   Tobacco comments:    Quit many years ago.  Vaping Use   Vaping Use: Never used  Substance and Sexual Activity   Alcohol use: Not Currently   Drug use: No   Sexual activity: Never  Other Topics Concern   Not on file  Social History Narrative   2 daughters/3 sons      Has living will   Son Onalee Hua is [facility] POA   Has DNR   Would accept hospital care   Would consider tube feeds--but not if persistent cognitive unawareness   Social Determinants of Health   Financial Resource Strain: Low Risk    Difficulty of Paying Living Expenses: Not hard at all  Food Insecurity: No Food Insecurity   Worried About Programme researcher, broadcasting/film/video in the Last Year: Never true   Ran Out of Food in the Last Year: Never true  Transportation Needs: No Transportation Needs   Lack of Transportation (Medical): No   Lack of Transportation (Non-Medical): No  Physical Activity: Insufficiently Active   Days of Exercise per Week: 2 days   Minutes of Exercise per Session: 60 min  Stress: No Stress Concern Present   Feeling of Stress : Not at all  Social Connections: Unknown   Frequency of Communication with Friends and Family: More than three times a week   Frequency of Social Gatherings with Friends and Family: Twice a week   Attends Religious Services: Not on Scientist, clinical (histocompatibility and immunogenetics) or Organizations: Yes   Attends Banker Meetings: Not on file   Marital Status: Widowed    Tobacco Counseling Counseling given: Not Answered Tobacco comments: Quit many years ago.   Clinical Intake:  Pre-visit preparation completed: Yes        Diabetes: No  How often do you need to have someone help you when you read instructions, pamphlets, or other written materials from your doctor or pharmacy?: 3 - Sometimes  Interpreter Needed?: No    Activities of Daily Living    03/02/2022    2:02 PM 07/15/2021    9:01 AM  In your present state of health, do you have  any difficulty performing the following activities:  Hearing? 1 0  Comment Hearing aids   Vision? 0 0  Difficulty concentrating or making decisions? 0 0  Comment Age appropriate   Walking or climbing stairs? 1 0  Comment Walker in use. Poor balance.   Dressing or bathing?  0  Comment As needed   Doing errands, shopping? 1   Comment Does not drive   Preparing Food and eating ? Y   Comment Meals prepared by staff. Self feeds.   Using the Toilet? N   In the past six months,  have you accidently leaked urine? Y   Comment Wears Depend brief   Do you have problems with loss of bowel control? N   Managing your Medications? N   Managing your Finances? Y   Comment Son Museum/gallery conservator or managing your Housekeeping? Y   Comment Maid assist    Patient Care Team: Sherlene Shams, MD as PCP - General (Internal Medicine) Scarlett Presto, RN as Oncology Nurse Navigator  Indicate any recent Medical Services you may have received from other than [facility] providers in the past year (date may be approximate).     Assessment:   This is a routine wellness examination for [place].  Virtual Visit via Telephone Note  I connected with  Pamela Paul on 03/02/22 at  8:15 AM EDT by telephone and verified that I am speaking with the correct person using two identifiers.  Persons participating in the virtual visit: patient/Nurse Health Advisor   I discussed the limitations of performing an evaluation and management service by telehealth. The patient expressed understanding and agreed to proceed. We continued and completed visit with audio only. Some vital signs may be absent or patient reported.   Hearing/Vision screen Hearing Screening - Comments:: Hearing aid, bilateral  Vision Screening - Comments:: Followed [facility]  Wears corrective lenses  Visits every 6 months  Cataract extraction, bilateral Macular Degeneration; dry They have regular follow up with the ophthalmologist  Dietary issues and  exercise activities discussed: Regular diet Current Exercise Habits: Home exercise routine, Type of exercise: walking (PT for 2 hours a week), Time (Minutes): 60, Frequency (Times/Week): 5, Weekly Exercise (Minutes/Week): 300, Intensity: Mild   Goals Addressed               This Visit's Progress     Patient Stated     Maintain Healthy Lifestyle (pt-stated)        Stay active Healthy diet       Depression Screen    03/02/2022    1:59 PM 09/13/2021    1:50 PM 08/17/2021   10:05 AM 05/31/2021    3:36 PM 03/01/2021    8:50 AM 02/02/2020    8:53 AM 01/31/2019    9:47 AM  PHQ 2/9 Scores  PHQ - 2 Score 0 0 0 0 0 0 0    Fall Risk    03/02/2022    2:02 PM 09/13/2021    1:48 PM 08/17/2021   10:05 AM 05/31/2021    3:37 PM 03/01/2021    8:55 AM  Fall Risk   Falls in the past year? 0 1 1 0 0  Number falls in past yr: 0 1 1 0 0  Injury with Fall?  0 0 0 0  Risk for fall due to :   History of fall(s)    Follow up Falls evaluation completed Falls evaluation completed Falls evaluation completed Falls evaluation completed Falls evaluation completed    FALL RISK PREVENTION PERTAINING TO THE HOME: Home free of loose throw rugs in walkways, pet beds, electrical cords, etc? Yes  Adequate lighting in your home to reduce risk of falls? Yes   ASSISTIVE DEVICES UTILIZED TO PREVENT FALLS: Life alert? Yes  Use of a cane, walker or w/c? Yes   TIMED UP AND GO: Was the test performed? No .   Cognitive Function:    10/11/2016    8:32 AM  MMSE - Mini Mental State Exam  Orientation to time 5  Orientation to Place 5  Registration  3  Attention/ Calculation 5  Recall 3  Language- name 2 objects 2  Language- repeat 1  Language- follow 3 step command 3  Language- read & follow direction 1  Write a sentence 1  Copy design 1  Total score 30        03/02/2022    2:05 PM 02/02/2020    9:08 AM 01/31/2019    9:45 AM 11/02/2017   12:48 PM 10/11/2016    8:34 AM  6CIT Screen  What Year? 0  points 0 points 0 points 0 points 0 points  What month? 0 points 0 points 0 points 0 points 0 points  What time? 0 points 0 points 0 points 0 points 0 points  Count back from 20 0 points 0 points 0 points 0 points 0 points  Months in reverse  0 points 0 points 0 points 0 points  Repeat phrase  0 points  0 points 0 points  Total Score  0 points  0 points 0 points    Immunizations Immunization History  Administered Date(s) Administered   Influenza Split 10/03/2014, 10/04/2015   Influenza, High Dose Seasonal PF 10/04/2019, 09/21/2020   Influenza-Unspecified 09/03/2013, 10/01/2016, 10/06/2017, 09/18/2018   Moderna Sars-Covid-2 Vaccination 12/22/2019, 01/19/2020, 08/08/2021   Pneumococcal Conjugate-13 10/26/2014   Pneumococcal Polysaccharide-23 04/04/1993, 09/20/2008, 01/28/2016   Tdap 02/14/2013   Zoster Recombinat (Shingrix) 02/07/2017, 06/18/2017   Zoster, Live 12/08/2009   Screening Tests Health Maintenance  Topic Date Due   INFLUENZA VACCINE  03/03/2022 (Originally 07/04/2021)   COVID-19 Vaccine (4 - Booster for Moderna series) 03/18/2022 (Originally 10/03/2021)   TETANUS/TDAP  02/15/2023   Pneumonia Vaccine 21+ Years old  Completed   DEXA SCAN  Completed   Zoster Vaccines- Shingrix  Completed   HPV VACCINES  Aged Out   Health Maintenance There are no preventive care reminders to display for this patient.  Lung Cancer Screening: (Low Dose CT Chest recommended if Age 55-80 years, 30 pack-year currently smoking OR have quit w/in 15years.) does not qualify.   Hepatitis C Screening: does not qualify.  Vision Screening: Recommended annual ophthalmology exams for early detection of glaucoma and other disorders of the eye.  Dental Screening: Recommended annual dental exams for proper oral hygiene  Community Resource Referral / Chronic Care Management: CRR required this visit?  No   CCM required this visit?  No      Plan:   Keep all routine maintenance appointments.   I  have personally reviewed and noted the following in the patient's chart:   Medical and social history Use of alcohol, tobacco or illicit drugs  Current medications and supplements including opioid prescriptions.  Functional ability and status Nutritional status Physical activity Advanced directives List of other physicians Hospitalizations, surgeries, and ER visits in previous 12 months Vitals Screenings to include cognitive, depression, and falls Referrals and appointments  In addition, I have reviewed and discussed with patient certain preventive protocols, quality metrics, and best practice recommendations. A written personalized care plan for preventive services as well as general preventive health recommendations were provided to patient.     OBrien-Blaney, Jerl Munyan L, LPN   1/61/0960       I have reviewed the above information and agree with above.   Duncan Dull, MD

## 2022-03-02 NOTE — Patient Instructions (Addendum)
?  Pamela Paul , ?Thank you for taking time to come for your Medicare Wellness Visit. I appreciate your ongoing commitment to your health goals. Please review the following plan we discussed and let me know if I can assist you in the future.  ? ?These are the goals we discussed: ? Goals   ? ?  ? Patient Stated  ?   Maintain Healthy Lifestyle (pt-stated)   ?   Stay active ?Healthy diet ?  ? ?  ?  ?This is a list of the screening recommended for you and due dates:  ?Health Maintenance  ?Topic Date Due  ? Flu Shot  03/03/2022*  ? COVID-19 Vaccine (4 - Booster for Moderna series) 03/18/2022*  ? Tetanus Vaccine  02/15/2023  ? Pneumonia Vaccine  Completed  ? DEXA scan (bone density measurement)  Completed  ? Zoster (Shingles) Vaccine  Completed  ? HPV Vaccine  Aged Out  ?*Topic was postponed. The date shown is not the original due date.  ?  ?

## 2022-04-20 ENCOUNTER — Encounter: Payer: Self-pay | Admitting: Internal Medicine

## 2022-04-20 ENCOUNTER — Ambulatory Visit (INDEPENDENT_AMBULATORY_CARE_PROVIDER_SITE_OTHER): Payer: Medicare HMO | Admitting: Internal Medicine

## 2022-04-20 DIAGNOSIS — I10 Essential (primary) hypertension: Secondary | ICD-10-CM

## 2022-04-20 NOTE — Assessment & Plan Note (Signed)
BP Readings from Last 3 Encounters:  04/20/22 (!) 148/62  01/20/22 (!) 124/59  01/10/22 132/73   Still pretty good off telmisartan Will reschedule the visit

## 2022-04-20 NOTE — Progress Notes (Signed)
   Subjective:    Patient ID: Pamela Paul, female    DOB: [birth year], 88 y.o.   MRN: [id number]  HPI Visit planned in assisted living apartment for follow up of chronic health conditions Reviewed status with Collier Salina RN  Actually ---she left to go out with a friend, so we will have to reschedule the visit   Review of Systems     Objective:   Physical Exam         Assessment & Plan:

## 2022-04-27 ENCOUNTER — Encounter: Payer: Self-pay | Admitting: Internal Medicine

## 2022-04-27 ENCOUNTER — Ambulatory Visit (INDEPENDENT_AMBULATORY_CARE_PROVIDER_SITE_OTHER): Payer: Medicare HMO | Admitting: Internal Medicine

## 2022-04-27 VITALS — BP 148/62 | HR 70 | Temp 97.7°F | Resp 18 | Wt 187.8 lb

## 2022-04-27 DIAGNOSIS — C50112 Malignant neoplasm of central portion of left female breast: Secondary | ICD-10-CM

## 2022-04-27 DIAGNOSIS — N2581 Secondary hyperparathyroidism of renal origin: Secondary | ICD-10-CM | POA: Diagnosis not present

## 2022-04-27 DIAGNOSIS — I10 Essential (primary) hypertension: Secondary | ICD-10-CM

## 2022-04-27 DIAGNOSIS — E039 Hypothyroidism, unspecified: Secondary | ICD-10-CM | POA: Diagnosis not present

## 2022-04-27 DIAGNOSIS — N184 Chronic kidney disease, stage 4 (severe): Secondary | ICD-10-CM | POA: Diagnosis not present

## 2022-04-27 DIAGNOSIS — L570 Actinic keratosis: Secondary | ICD-10-CM

## 2022-04-27 DIAGNOSIS — C50411 Malignant neoplasm of upper-outer quadrant of right female breast: Secondary | ICD-10-CM

## 2022-04-27 DIAGNOSIS — Z170 Estrogen receptor positive status [ER+]: Secondary | ICD-10-CM

## 2022-04-27 NOTE — Assessment & Plan Note (Signed)
Discussed alternatives and cryotherapy She gave verbal consent  Cryotherapy for 30 seconds x 2  Tolerated well Discussed care with Hardie Lora

## 2022-04-27 NOTE — Assessment & Plan Note (Signed)
Appears to be euthyroid on levothyroxine 50

## 2022-04-27 NOTE — Assessment & Plan Note (Signed)
Has decided to stop the medication Given her age---just having surveillance makes sense

## 2022-04-27 NOTE — Assessment & Plan Note (Signed)
Is on vitamin D 

## 2022-04-27 NOTE — Progress Notes (Signed)
Subjective:    Patient ID: Pamela Paul, female    DOB: 01/22/1926, 88 y.o.   MRN: [id number]  HPI Visit in assisted living apartment for review of chronic health conditions Reviewed status with Collier Salina RN  Doing well Still independent with instrumental ADLs Generally continent  Does have some DOE---if she walks fast No chest pain No palpitations Some dizziness a while back--no recurrence. No syncope No edema  Did decide to stop the cancer Rx No problems  Some pain in fingers No sig back pain  Last GFR 22  Current Outpatient Medications on File Prior to Visit  Medication Sig Dispense Refill   Calcium Carbonate-Vitamin D3 600-400 MG-UNIT TABS Take 1 tablet by mouth 2 (two) times daily.     levothyroxine (SYNTHROID) 50 MCG tablet TAKE 1 TABLET EVERY DAY ON EMPTY STOMACHWITH A GLASS OF WATER AT LEAST 30-60 MINBEFORE BREAKFAST 30 tablet 11   Magnesium Oxide 250 MG TABS Take 1 tablet by mouth every other day. At bedtime     Multiple Vitamins-Minerals (PRESERVISION AREDS PO) Take 1 capsule by mouth 2 (two) times daily.     Bacillus Coagulans-Inulin (ALIGN PREBIOTIC-PROBIOTIC PO) Take by mouth.     No current facility-administered medications on file prior to visit.    Allergies  Allergen Reactions   Bactrim [Sulfamethoxazole-Trimethoprim] Nausea And Vomiting    Lethargic and weakness    Past Medical History:  Diagnosis Date   Breast cancer ([place])    CKD (chronic kidney disease) stage 3, GFR 30-59 ml/min (HCC)    Heart disease    mitral valvular   Hyperlipidemia    Hyperparathyroidism, secondary ([place])    Hypertension    Hypothyroidism    Spinal stenosis of lumbar region 2006   s/p surgery    Past Surgical History:  Procedure Laterality Date   ABDOMINAL HYSTERECTOMY     bilateral cataract surgery     BREAST LUMPECTOMY Right 07/22/2021   CHOLECYSTECTOMY  12/04/1978   FRACTURE SURGERY Left 12/29/2018   FEMUR   JOINT REPLACEMENT     Bilateral knee    LAPAROSCOPIC SIGMOID COLECTOMY  12/04/2009   secondary to benign mass   MELANOMA EXCISION WITH SENTINEL LYMPH NODE BIOPSY Right 07/22/2021   Procedure: MELANOMA EXCISION WITH SENTINEL LYMPH NODE BIOPSY;  Surgeon: Herbert Pun, MD;  Location: [facility] ORS;  Service: General;  Laterality: Right;   PART [place] Right 07/22/2021   Procedure: PART MASTECTOMY,RADIO FREQUENCY LOCALIZER,AXILLARY SENTINEL NODE BIOPSY;  Surgeon: Herbert Pun, MD;  Location: [facility] ORS;  Service: General;  Laterality: Right;   REPLACEMENT TOTAL KNEE BILATERAL     SMALL INTESTINE SURGERY  12/04/2008   for SBO   SPINE SURGERY  12/04/2004   [facility], [facility]   tendon repair     achille heall, left     Family History  Problem Relation Age of Onset   Arthritis Mother    Stroke Mother    Hyperlipidemia Father    Cancer Father    Heart disease Paternal Aunt     Social History   Socioeconomic History   Marital status: Widowed    Spouse name: Not on file   Number of children: 5   Years of education: Not on file   Highest education level: Not on file  Occupational History   Occupation: Preschool teacher/director    Comment: Retired  Tobacco Use   Smoking status: Former    Types: Cigarettes    Quit date: 03/15/1961  Years since quitting: 61.1   Smokeless tobacco: Never   Tobacco comments:    Quit many years ago.  Vaping Use   Vaping Use: Never used  Substance and Sexual Activity   Alcohol use: Not Currently   Drug use: No   Sexual activity: Never  Other Topics Concern   Not on file  Social History Narrative   2 daughters/3 sons      Has living will   Son Shanon Brow is [facility] POA   Has DNR   Would accept hospital care   Would consider tube feeds--but not if persistent cognitive unawareness   Social Determinants of Health   Financial Resource Strain: Low Risk    Difficulty of Paying Living Expenses: Not hard at all  Food Insecurity: No  Food Insecurity   Worried About Charity fundraiser in the Last Year: Never true   [place] in the Last Year: Never true  Transportation Needs: No Transportation Needs   Lack of Transportation (Medical): No   Lack of Transportation (Non-Medical): No  Physical Activity: Insufficiently Active   Days of Exercise per Week: 2 days   Minutes of Exercise per Session: 60 min  Stress: No Stress Concern Present   Feeling of Stress : Not at all  Social Connections: Unknown   Frequency of Communication with Friends and Family: More than three times a week   Frequency of Social Gatherings with Friends and Family: Twice a week   Attends Religious Services: Not on Electrical engineer or Organizations: Yes   Attends Archivist Meetings: Not on file   Marital Status: Widowed  Human resources officer Violence: Not At Risk   Fear of Current or Ex-Partner: No   Emotionally Abused: No   Physically Abused: No   Sexually Abused: No   Review of Systems Appetite is good Sleeping fairly well--now taking magnesium at night. She feels that is helping Bowels are fine Has some annoying skin issues     Objective:   Physical Exam Constitutional:      Appearance: Normal appearance.  Cardiovascular:     Rate and Rhythm: Normal rate and regular rhythm.     Heart sounds: No murmur heard.   No gallop.  Pulmonary:     Effort: Pulmonary effort is normal.     Breath sounds: Normal breath sounds. No wheezing or rales.  Abdominal:     Palpations: Abdomen is soft.     Tenderness: There is no abdominal tenderness.  Musculoskeletal:     Cervical back: Neck supple.     Right lower leg: No edema.     Left lower leg: No edema.  Lymphadenopathy:     Cervical: No cervical adenopathy.  Skin:    Comments: 54m scaly raised lesion on lower left extensor arm  Neurological:     Mental Status: She is alert.  Psychiatric:        Mood and Affect: Mood normal.        Behavior: Behavior normal.            Assessment & Plan:

## 2022-04-27 NOTE — Assessment & Plan Note (Signed)
Has decided against the medication--stopped it Given her age---surveillance alone makes sense

## 2022-04-27 NOTE — Addendum Note (Signed)
Addended by: Viviana Simpler I on: 04/27/2022 12:43 PM   Modules accepted: Orders

## 2022-04-27 NOTE — Assessment & Plan Note (Signed)
BP Readings from Last 3 Encounters:  04/27/22 (!) 148/62  04/20/22 (!) 148/62  01/20/22 (!) 124/59   Mild elevation but given age---will continue no meds

## 2022-04-27 NOTE — Assessment & Plan Note (Signed)
This has been stable No uremic symptoms or fluid overload No Rx for now

## 2022-05-04 ENCOUNTER — Telehealth: Payer: Self-pay | Admitting: *Deleted

## 2022-05-04 NOTE — Telephone Encounter (Signed)
Patient's son called to cancel her Feb appt. He did not wish to rescedule.

## 2022-05-30 ENCOUNTER — Other Ambulatory Visit: Payer: Self-pay | Admitting: Internal Medicine

## 2022-05-30 ENCOUNTER — Other Ambulatory Visit: Payer: Self-pay | Admitting: Oncology

## 2022-06-22 ENCOUNTER — Ambulatory Visit (INDEPENDENT_AMBULATORY_CARE_PROVIDER_SITE_OTHER): Payer: Medicare HMO | Admitting: Internal Medicine

## 2022-06-22 ENCOUNTER — Encounter: Payer: Self-pay | Admitting: Internal Medicine

## 2022-06-22 DIAGNOSIS — L570 Actinic keratosis: Secondary | ICD-10-CM | POA: Diagnosis not present

## 2022-06-22 NOTE — Progress Notes (Signed)
   Subjective:    Patient ID: Pamela Paul, female    DOB: 1926/02/02, 88 y.o.   MRN: [id number]  HPI Asked to see by resident and nurse for lesion of left arm Has been there a while Some irritation   Review of Systems     Objective:   Physical Exam Skin:    Comments: 40m raised scaly lesion on extensor distal left arm Slight surrounding redness            Assessment & Plan:

## 2022-06-22 NOTE — Assessment & Plan Note (Signed)
She asks for treatment---discussed options, etc She gave verbal consent  Lesion treated with cryotherapy 35 seconds x 2 Tolerated well Discussed care with her and Hardie Lora

## 2022-07-11 ENCOUNTER — Ambulatory Visit: Payer: Medicare HMO | Admitting: Nurse Practitioner

## 2022-07-28 ENCOUNTER — Encounter: Payer: Self-pay | Admitting: Internal Medicine

## 2022-07-28 ENCOUNTER — Non-Acute Institutional Stay: Payer: Medicare HMO | Admitting: Internal Medicine

## 2022-07-28 DIAGNOSIS — I10 Essential (primary) hypertension: Secondary | ICD-10-CM

## 2022-07-28 DIAGNOSIS — E039 Hypothyroidism, unspecified: Secondary | ICD-10-CM

## 2022-07-28 DIAGNOSIS — N2581 Secondary hyperparathyroidism of renal origin: Secondary | ICD-10-CM

## 2022-07-28 DIAGNOSIS — N184 Chronic kidney disease, stage 4 (severe): Secondary | ICD-10-CM | POA: Diagnosis not present

## 2022-07-28 NOTE — Assessment & Plan Note (Signed)
On vitamin D 

## 2022-07-28 NOTE — Assessment & Plan Note (Signed)
Controlled on telmisartan We will monitor

## 2022-07-28 NOTE — Assessment & Plan Note (Signed)
On telmisartan for renal protection

## 2022-07-28 NOTE — Assessment & Plan Note (Signed)
Continue levothyroxine Monitor free T4 yearly

## 2022-07-28 NOTE — Progress Notes (Signed)
Subjective:    Patient ID: Pamela Paul, female    DOB: 06/06/1926, 88 y.o.   MRN: [id number]  HPI  Resident seen in [address] Reviewed with RN, no new concerns. Resident reports she sleeps well. She is independent with ADL's. She walks with a rollater. Her appetite is good, weight down slightly. She denies urinary incontinence. Her bowels move well. She reports intermittent pain in her back and knees. She denies chest pain or SOB. She reports her mood is great.  OA: Mainly in her back and knees. She is not taking any medication for this.  CKD: Her last creatinine was 1.5, GFR 32, 04/2022. She is on Telmisartan for renal protection.   HTN: Her BP today is. She is taking Telmisartan as prescribed. ECG from reviewed.  Hypothyroidism: She denies any issues on her current dose of Levothyroxine.  Review of Systems     Past Medical History:  Diagnosis Date   Breast cancer ([place])    CKD (chronic kidney disease) stage 3, GFR 30-59 ml/min ([facility])    Heart disease    mitral valvular   Hyperlipidemia    Hyperparathyroidism, secondary ([place])    Hypertension    Hypothyroidism    Spinal stenosis of lumbar region 2006   s/p surgery    Current Outpatient Medications  Medication Sig Dispense Refill   Calcium Carbonate-Vitamin D3 600-400 MG-UNIT TABS Take 1 tablet by mouth 2 (two) times daily.     levothyroxine (SYNTHROID) 50 MCG tablet TAKE 1 TABLET EVERY DAY ON EMPTY STOMACHWITH A GLASS OF WATER AT LEAST 30-60 MINBEFORE BREAKFAST 30 tablet 11   Magnesium Oxide 250 MG TABS Take 1 tablet by mouth every other day. At bedtime     Multiple Vitamins-Minerals (PRESERVISION AREDS PO) Take 1 capsule by mouth 2 (two) times daily.     telmisartan (MICARDIS) 20 MG tablet TAKE 1 TABLET BY MOUTH DAILY 90 tablet 0   No current facility-administered medications for this visit.    Allergies  Allergen Reactions   Bactrim [Sulfamethoxazole-Trimethoprim] Nausea And Vomiting    Lethargic and weakness     Family History  Problem Relation Age of Onset   Arthritis Mother    Stroke Mother    Hyperlipidemia Father    Cancer Father    Heart disease Paternal Aunt     Social History   Socioeconomic History   Marital status: Widowed    Spouse name: Not on file   Number of children: 5   Years of education: Not on file   Highest education level: Not on file  Occupational History   Occupation: Preschool teacher/director    Comment: Retired  Tobacco Use   Smoking status: Former    Types: Cigarettes    Quit date: 03/15/1961    Years since quitting: 61.4   Smokeless tobacco: Never   Tobacco comments:    Quit many years ago.  Vaping Use   Vaping Use: Never used  Substance and Sexual Activity   Alcohol use: Not Currently   Drug use: No   Sexual activity: Never  Other Topics Concern   Not on file  Social History Narrative   2 daughters/3 sons      Has living will   Son Shanon Brow is [facility] POA   Has DNR   Would accept hospital care   Would consider tube feeds--but not if persistent cognitive unawareness   Social Determinants of Health   Financial Resource Strain: Low Risk  (03/02/2022)   Overall Financial Resource  Strain (CARDIA)    Difficulty of Paying Living Expenses: Not hard at all  Food Insecurity: No Food Insecurity (03/02/2022)   Hunger Vital Sign    Worried About Running Out of Food in the Last Year: Never true    Ran Out of Food in the Last Year: Never true  Transportation Needs: No Transportation Needs (03/02/2022)   PRAPARE - Hydrologist (Medical): No    Lack of Transportation (Non-Medical): No  Physical Activity: Insufficiently Active (03/02/2022)   Exercise Vital Sign    Days of Exercise per Week: 2 days    Minutes of Exercise per Session: 60 min  Stress: No Stress Concern Present (03/02/2022)   [place]    Feeling of Stress : Not at all  Social Connections: Unknown  (03/02/2022)   Social Connection and Isolation Panel [NHANES]    Frequency of Communication with Friends and Family: More than three times a week    Frequency of Social Gatherings with Friends and Family: Twice a week    Attends Religious Services: Not on Advertising copywriter or Organizations: Yes    Attends Archivist Meetings: Not on file    Marital Status: Widowed  Intimate Partner Violence: Not At Risk (03/02/2022)   Humiliation, Afraid, Rape, and Kick questionnaire    Fear of Current or Ex-Partner: No    Emotionally Abused: No    Physically Abused: No    Sexually Abused: No     Constitutional: Denies fever, malaise, fatigue, headache or abrupt weight changes.  HEENT: Denies eye pain, eye redness, ear pain, ringing in the ears, wax buildup, runny nose, nasal congestion, bloody nose, or sore throat. Respiratory: Denies difficulty breathing, shortness of breath, cough or sputum production.   Cardiovascular: Denies chest pain, chest tightness, palpitations or swelling in the hands or feet.  Gastrointestinal: Denies abdominal pain, bloating, constipation, diarrhea or blood in the stool.  GU: Denies urgency, frequency, pain with urination, burning sensation, blood in urine, odor or discharge. Musculoskeletal: Pt reports intermittent joint pain. Denies decrease in range of motion, difficulty with gait, muscle pain or joint swelling.  Skin: Denies redness, rashes, lesions or ulcercations.  Neurological: Denies dizziness, difficulty with memory, difficulty with speech or problems with balance and coordination.  Psych: Denies anxiety, depression, SI/HI.  No other specific complaints in a complete review of systems (except as listed in HPI above).  Objective:   Physical Exam   BP (!) 149/68   Pulse 72   Resp 17   Wt 182 lb (82.6 kg)   BMI 30.29 kg/m  Wt Readings from Last 3 Encounters:  07/28/22 182 lb (82.6 kg)  04/27/22 187 lb 12.8 oz (85.2 kg)  04/20/22 187  lb 12.8 oz (85.2 kg)    General: Appears her stated age, in NAD. Skin: Warm, dry and intact.  Neck:  Neck supple, trachea midline. No masses, lumps or thyromegaly present.  Cardiovascular: Normal rate and rhythm. S1,S2 noted.  No murmur, rubs or gallops noted. No JVD or BLE edema.  Pulmonary/Chest: Normal effort and positive vesicular breath sounds. No respiratory distress. No wheezes, rales or ronchi noted.  Abdomen: Soft and nontender. Normal bowel sounds.  Neurological: Alert and oriented.  Psychiatric: Appropriate.  BMET    Component Value Date/Time   NA 122 (L) 09/13/2021 1412   NA 141 03/08/2020 0000   K 5.1 09/13/2021 1412   CL 86 (L)  09/13/2021 1412   CO2 25 09/13/2021 1412   GLUCOSE 103 (H) 09/13/2021 1412   BUN 25 (H) 09/13/2021 1412   BUN 27 (A) 03/08/2020 0000   CREATININE 1.89 (H) 09/13/2021 1412   CALCIUM 10.0 09/13/2021 1412   GFRNONAA 36 (L) 07/19/2021 1435    Lipid Panel     Component Value Date/Time   CHOL 213 (H) 08/17/2021 1042   TRIG 377 (H) 08/17/2021 1042   HDL 45 (L) 08/17/2021 1042   CHOLHDL 4.7 08/17/2021 1042   VLDL 34.4 07/31/2018 0823   LDLCALC 114 (H) 08/17/2021 1042    CBC    Component Value Date/Time   WBC 7.4 09/13/2021 1412   RBC 3.69 (L) 09/13/2021 1412   HGB 11.7 (L) 09/13/2021 1412   HCT 35.0 (L) 09/13/2021 1412   PLT 412.0 (H) 09/13/2021 1412   MCV 95.0 09/13/2021 1412   MCH 32.7 07/19/2021 1435   MCHC 33.4 09/13/2021 1412   RDW 12.3 09/13/2021 1412   LYMPHSABS 1.7 09/13/2021 1412   MONOABS 0.7 09/13/2021 1412   EOSABS 0.2 09/13/2021 1412   BASOSABS 0.0 09/13/2021 1412    Hgb A1C Lab Results  Component Value Date   HGBA1C 5.8 03/08/2020           Assessment & Plan:

## 2022-09-16 IMAGING — MG MM BREAST LOCALIZATION CLIP
4 series · 4 of 12 positions shown · non-contrast
Comparison: Previous exam(s).

CLINICAL DATA: Evaluate ARLINDO MENDES biopsy clip placement following
ultrasound-guided RIGHT breast biopsy.

EXAM:
3D DIAGNOSTIC RIGHT MAMMOGRAM POST ULTRASOUND BIOPSY

[R CC synth-2D]
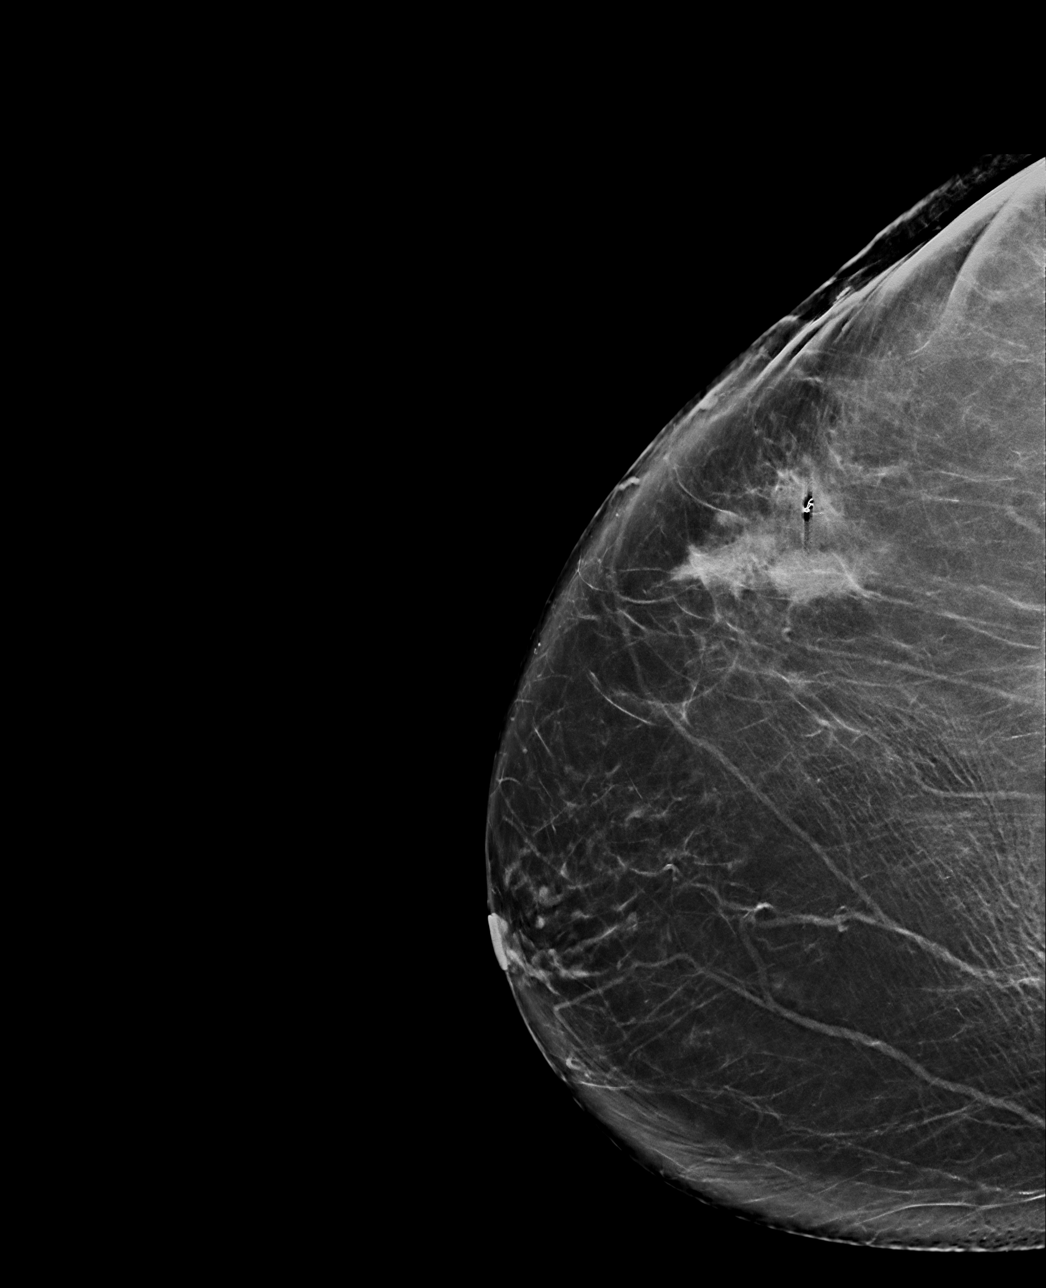

[R ML synth-2D]
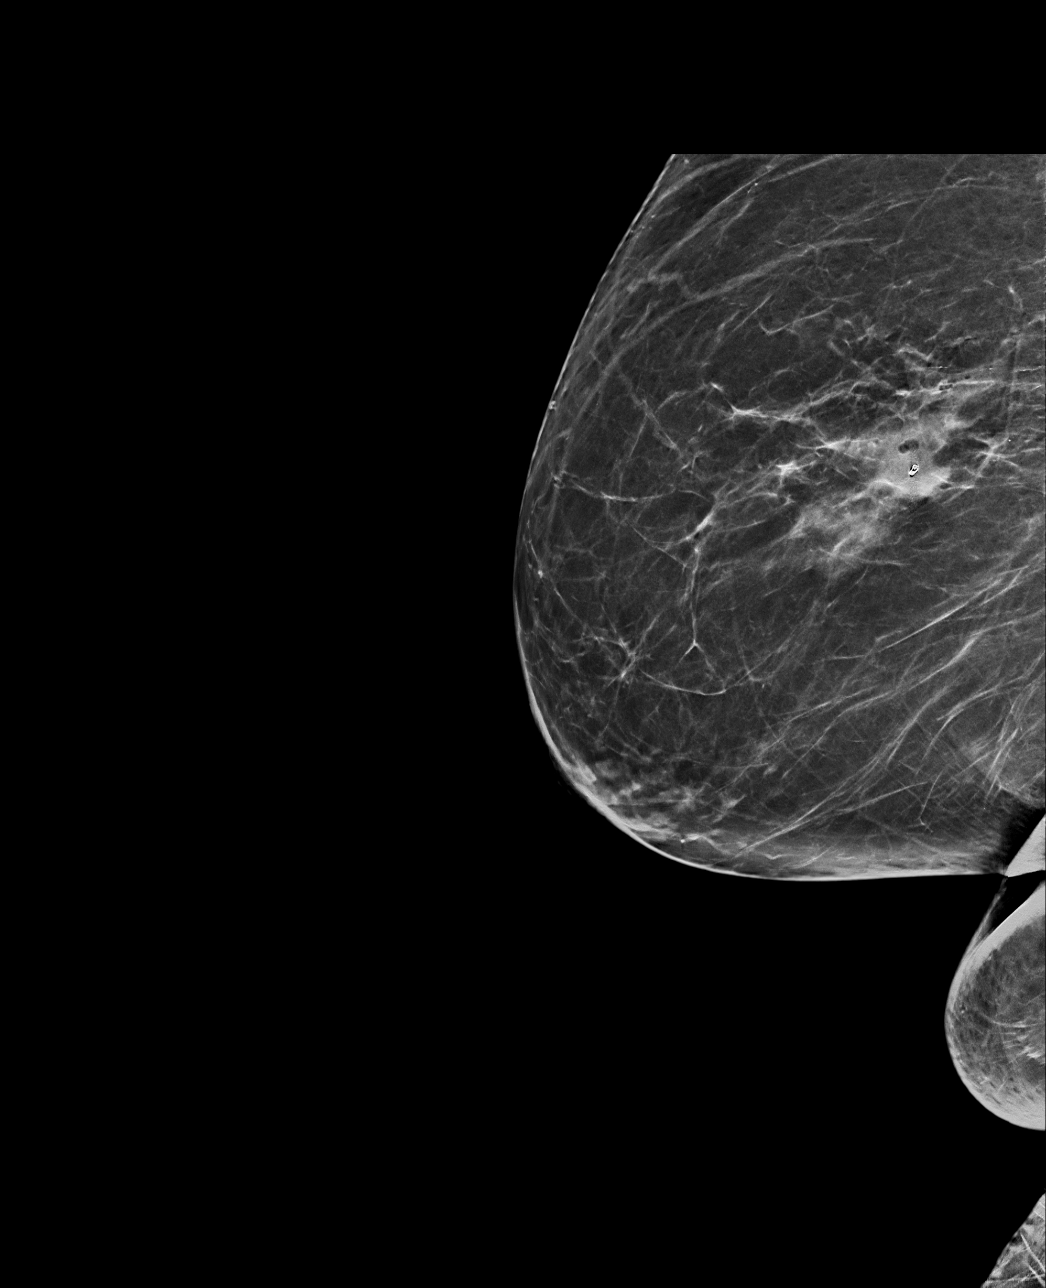

[R ML tomo · tomo slice 36/71.0]
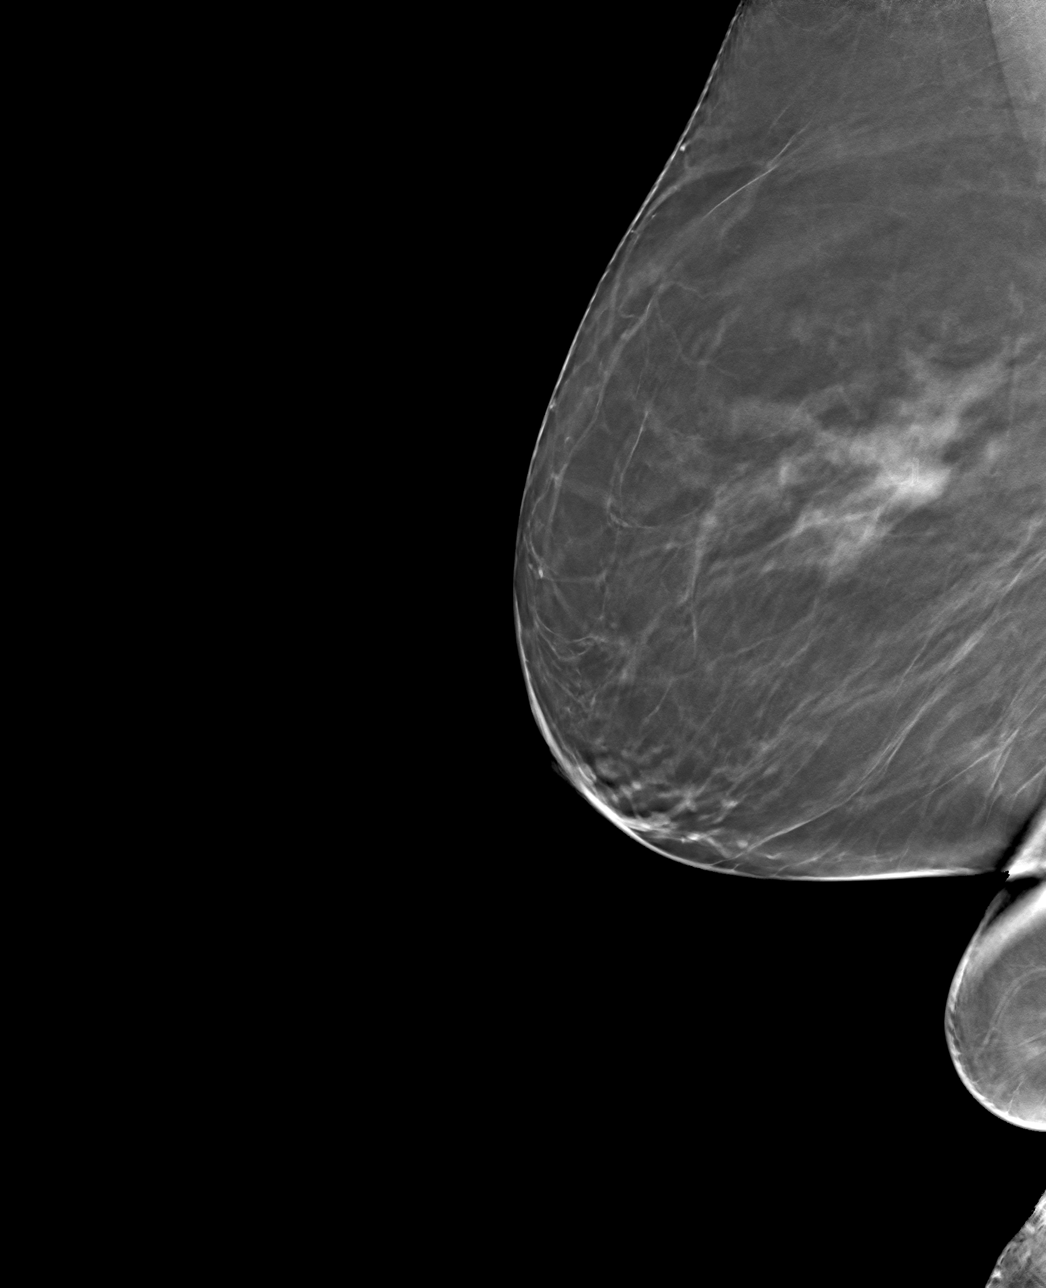

[R CC tomo · tomo slice 37/74.0]
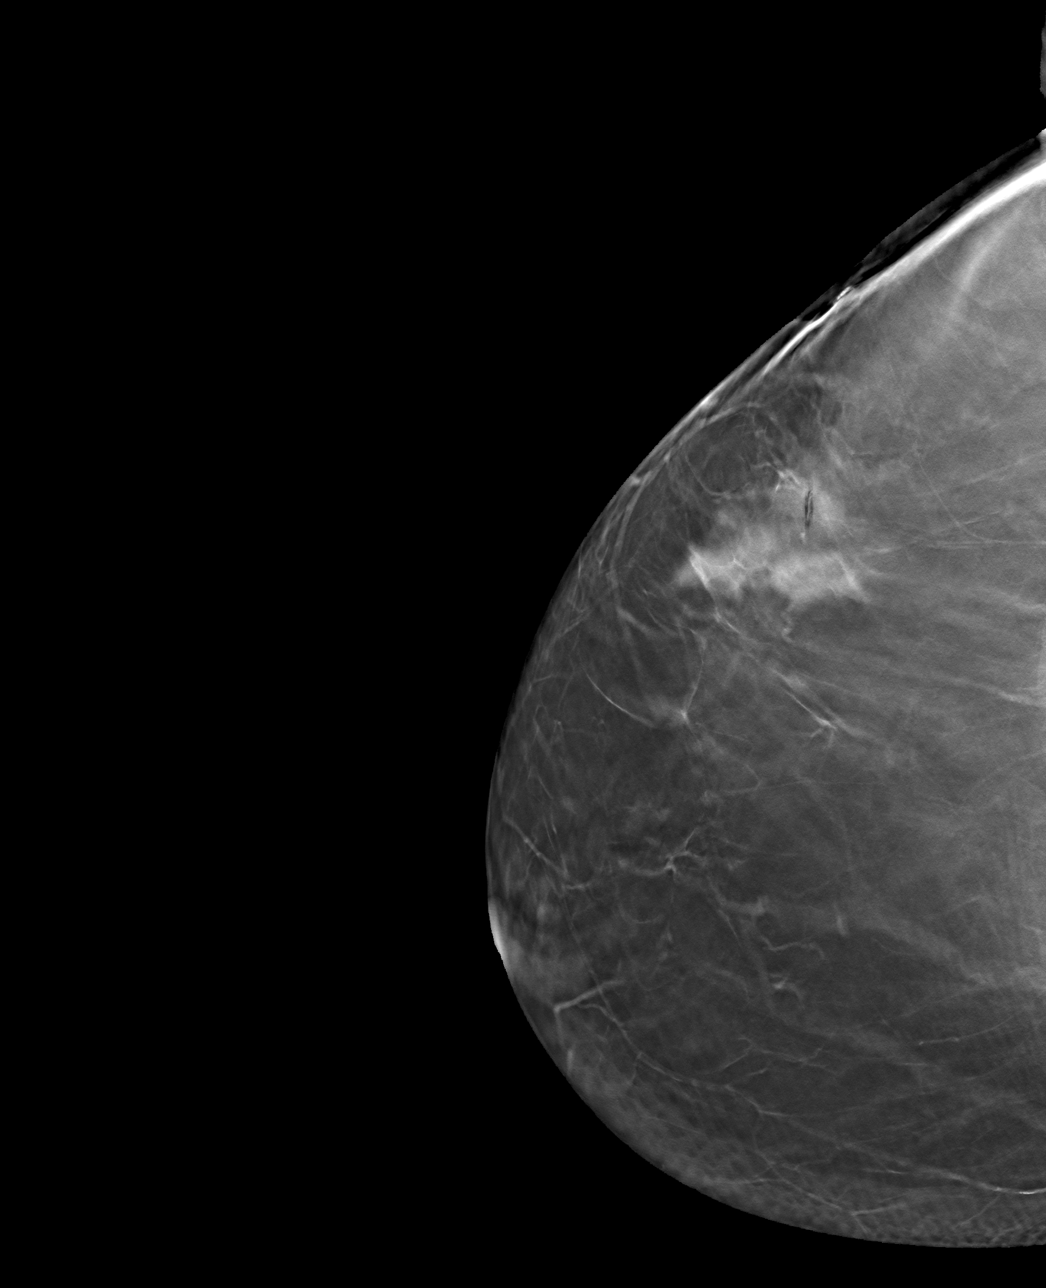

[4 of 12 positions shown; findings below may reference images not displayed]

FINDINGS: 3D Mammographic images were obtained following ultrasound guided
biopsy of the irregular mass at the 10 o'clock position of the RIGHT
breast. The ARLINDO MENDES biopsy marking clip is in expected position at the
site of biopsy.
IMPRESSION: Appropriate positioning of the ARLINDO MENDES shaped biopsy marking clip at
the site of biopsy in the UPPER OUTER RIGHT breast.

Final Assessment: Post Procedure Mammograms for Marker Placement

## 2022-09-20 ENCOUNTER — Inpatient Hospital Stay
Admission: EM | Admit: 2022-09-20 | Discharge: 2022-09-25 | DRG: 481 | Disposition: A | Discharge status: Skilled Nursing Facility | Payer: Medicare HMO | Attending: Internal Medicine | Admitting: Internal Medicine

## 2022-09-20 ENCOUNTER — Emergency Department: Payer: Medicare HMO

## 2022-09-20 ENCOUNTER — Other Ambulatory Visit: Payer: Self-pay

## 2022-09-20 DIAGNOSIS — E222 Syndrome of inappropriate secretion of antidiuretic hormone: Secondary | ICD-10-CM | POA: Diagnosis present

## 2022-09-20 DIAGNOSIS — T502X5A Adverse effect of carbonic-anhydrase inhibitors, benzothiadiazides and other diuretics, initial encounter: Secondary | ICD-10-CM | POA: Diagnosis not present

## 2022-09-20 DIAGNOSIS — Z823 Family history of stroke: Secondary | ICD-10-CM

## 2022-09-20 DIAGNOSIS — N2581 Secondary hyperparathyroidism of renal origin: Secondary | ICD-10-CM | POA: Diagnosis present

## 2022-09-20 DIAGNOSIS — I129 Hypertensive chronic kidney disease with stage 1 through stage 4 chronic kidney disease, or unspecified chronic kidney disease: Secondary | ICD-10-CM | POA: Diagnosis present

## 2022-09-20 DIAGNOSIS — S79911A Unspecified injury of right hip, initial encounter: Secondary | ICD-10-CM | POA: Diagnosis not present

## 2022-09-20 DIAGNOSIS — Z853 Personal history of malignant neoplasm of breast: Secondary | ICD-10-CM

## 2022-09-20 DIAGNOSIS — S72141A Displaced intertrochanteric fracture of right femur, initial encounter for closed fracture: Principal | ICD-10-CM | POA: Diagnosis present

## 2022-09-20 DIAGNOSIS — H9190 Unspecified hearing loss, unspecified ear: Secondary | ICD-10-CM | POA: Diagnosis present

## 2022-09-20 DIAGNOSIS — N179 Acute kidney failure, unspecified: Secondary | ICD-10-CM | POA: Diagnosis not present

## 2022-09-20 DIAGNOSIS — E871 Hypo-osmolality and hyponatremia: Secondary | ICD-10-CM

## 2022-09-20 DIAGNOSIS — W19XXXA Unspecified fall, initial encounter: Secondary | ICD-10-CM

## 2022-09-20 DIAGNOSIS — Z85820 Personal history of malignant melanoma of skin: Secondary | ICD-10-CM

## 2022-09-20 DIAGNOSIS — Y92019 Unspecified place in single-family (private) house as the place of occurrence of the external cause: Secondary | ICD-10-CM

## 2022-09-20 DIAGNOSIS — Z79890 Hormone replacement therapy (postmenopausal): Secondary | ICD-10-CM

## 2022-09-20 DIAGNOSIS — Z66 Do not resuscitate: Secondary | ICD-10-CM | POA: Diagnosis present

## 2022-09-20 DIAGNOSIS — T40415A Adverse effect of fentanyl or fentanyl analogs, initial encounter: Secondary | ICD-10-CM | POA: Diagnosis not present

## 2022-09-20 DIAGNOSIS — R9431 Abnormal electrocardiogram [ECG] [EKG]: Secondary | ICD-10-CM | POA: Diagnosis present

## 2022-09-20 DIAGNOSIS — W1830XA Fall on same level, unspecified, initial encounter: Secondary | ICD-10-CM | POA: Diagnosis present

## 2022-09-20 DIAGNOSIS — Z87891 Personal history of nicotine dependence: Secondary | ICD-10-CM

## 2022-09-20 DIAGNOSIS — I959 Hypotension, unspecified: Secondary | ICD-10-CM | POA: Diagnosis not present

## 2022-09-20 DIAGNOSIS — N1831 Chronic kidney disease, stage 3a: Secondary | ICD-10-CM | POA: Diagnosis present

## 2022-09-20 DIAGNOSIS — I440 Atrioventricular block, first degree: Secondary | ICD-10-CM | POA: Diagnosis present

## 2022-09-20 DIAGNOSIS — Z96653 Presence of artificial knee joint, bilateral: Secondary | ICD-10-CM | POA: Diagnosis present

## 2022-09-20 DIAGNOSIS — R0603 Acute respiratory distress: Secondary | ICD-10-CM | POA: Diagnosis not present

## 2022-09-20 DIAGNOSIS — E861 Hypovolemia: Secondary | ICD-10-CM | POA: Diagnosis not present

## 2022-09-20 DIAGNOSIS — Z8249 Family history of ischemic heart disease and other diseases of the circulatory system: Secondary | ICD-10-CM

## 2022-09-20 DIAGNOSIS — Z882 Allergy status to sulfonamides status: Secondary | ICD-10-CM

## 2022-09-20 DIAGNOSIS — Z809 Family history of malignant neoplasm, unspecified: Secondary | ICD-10-CM

## 2022-09-20 DIAGNOSIS — E785 Hyperlipidemia, unspecified: Secondary | ICD-10-CM | POA: Diagnosis present

## 2022-09-20 DIAGNOSIS — S72001A Fracture of unspecified part of neck of right femur, initial encounter for closed fracture: Principal | ICD-10-CM

## 2022-09-20 DIAGNOSIS — R339 Retention of urine, unspecified: Secondary | ICD-10-CM | POA: Diagnosis not present

## 2022-09-20 DIAGNOSIS — Z8261 Family history of arthritis: Secondary | ICD-10-CM

## 2022-09-20 DIAGNOSIS — E039 Hypothyroidism, unspecified: Secondary | ICD-10-CM | POA: Diagnosis present

## 2022-09-20 DIAGNOSIS — I10 Essential (primary) hypertension: Secondary | ICD-10-CM | POA: Diagnosis present

## 2022-09-20 DIAGNOSIS — E873 Alkalosis: Secondary | ICD-10-CM | POA: Diagnosis present

## 2022-09-20 DIAGNOSIS — Z83438 Family history of other disorder of lipoprotein metabolism and other lipidemia: Secondary | ICD-10-CM

## 2022-09-20 DIAGNOSIS — Z79899 Other long term (current) drug therapy: Secondary | ICD-10-CM

## 2022-09-20 DIAGNOSIS — D62 Acute posthemorrhagic anemia: Secondary | ICD-10-CM

## 2022-09-20 HISTORY — DX: Unspecified sensorineural hearing loss: H90.5

## 2022-09-20 HISTORY — DX: Personal history of nicotine dependence: Z87.891

## 2022-09-20 LAB — BASIC METABOLIC PANEL
Anion gap: 9 (ref 5–15)
BUN: 22 mg/dL (ref 8–23)
CO2: 26 mmol/L (ref 22–32)
Calcium: 9.3 mg/dL (ref 8.9–10.3)
Chloride: 98 mmol/L (ref 98–111)
Creatinine, Ser: 1.67 mg/dL — ABNORMAL HIGH (ref 0.44–1.00)
GFR, Estimated: 28 mL/min — ABNORMAL LOW (ref >60)
Glucose, Bld: 117 mg/dL — ABNORMAL HIGH (ref 70–99)
Potassium: 3.8 mmol/L (ref 3.5–5.1)
Sodium: 133 mmol/L — ABNORMAL LOW (ref 135–145)

## 2022-09-20 LAB — CBC WITH DIFFERENTIAL/PLATELET
Abs Immature Granulocytes: 0.03 10*3/uL (ref 0.00–0.07)
Basophils Absolute: 0.1 10*3/uL (ref 0.0–0.1)
Basophils Relative: 1 %
Eosinophils Absolute: 0.1 10*3/uL (ref 0.0–0.5)
Eosinophils Relative: 2 %
HCT: 35.4 % — ABNORMAL LOW (ref 36.0–46.0)
Hemoglobin: 11.8 g/dL — ABNORMAL LOW (ref 12.0–15.0)
Immature Granulocytes: 0 %
Lymphocytes Relative: 23 %
Lymphs Abs: 1.9 10*3/uL (ref 0.7–4.0)
MCH: 31.9 pg (ref 26.0–34.0)
MCHC: 33.3 g/dL (ref 30.0–36.0)
MCV: 95.7 fL (ref 80.0–100.0)
Monocytes Absolute: 0.8 10*3/uL (ref 0.1–1.0)
Monocytes Relative: 9 %
Neutro Abs: 5.6 10*3/uL (ref 1.7–7.7)
Neutrophils Relative %: 65 %
Platelets: 247 10*3/uL (ref 150–400)
RBC: 3.7 MIL/uL — ABNORMAL LOW (ref 3.87–5.11)
RDW: 11.9 % (ref 11.5–15.5)
WBC: 8.5 10*3/uL (ref 4.0–10.5)
nRBC: 0 % (ref 0.0–0.2)

## 2022-09-20 MED ORDER — FENTANYL CITRATE PF 50 MCG/ML IJ SOSY
50.0000 ug | PREFILLED_SYRINGE | INTRAMUSCULAR | Status: DC | PRN
  Administered 2022-09-21: 50 ug via INTRAVENOUS
  Filled 2022-09-20: qty 1

## 2022-09-20 MED ORDER — FENTANYL CITRATE PF 50 MCG/ML IJ SOSY
50.0000 ug | PREFILLED_SYRINGE | Freq: Once | INTRAMUSCULAR | Status: AC
  Administered 2022-09-20: 50 ug via INTRAVENOUS
  Filled 2022-09-20: qty 1

## 2022-09-20 MED ORDER — MORPHINE SULFATE (PF) 4 MG/ML IV SOLN
4.0000 mg | Freq: Once | INTRAVENOUS | Status: AC
  Administered 2022-09-20: 4 mg via INTRAVENOUS
  Filled 2022-09-20: qty 1

## 2022-09-20 MED ORDER — SODIUM CHLORIDE 0.9 % IV SOLN: INTRAVENOUS | Status: DC

## 2022-09-20 MED ORDER — FENTANYL CITRATE PF 50 MCG/ML IJ SOSY: 50.0000 ug | PREFILLED_SYRINGE | INTRAMUSCULAR | Status: DC | PRN

## 2022-09-20 MED ORDER — CEFAZOLIN SODIUM-DEXTROSE 2-4 GM/100ML-% IV SOLN
2.0000 g | INTRAVENOUS | Status: AC
  Filled 2022-09-20: qty 100

## 2022-09-20 MED ORDER — KETOROLAC TROMETHAMINE 15 MG/ML IJ SOLN
15.0000 mg | Freq: Once | INTRAMUSCULAR | Status: AC
  Administered 2022-09-21: 15 mg via INTRAVENOUS
  Filled 2022-09-20: qty 1

## 2022-09-20 NOTE — ED Provider Notes (Signed)
[facility] [address] Provider Note    Event Date/Time   First MD Initiated Contact with Patient 09/20/22 2040     (approximate)   History   Hip Pain   HPI  Pamela Paul is a 88 y.o. female who presents to the emergency department following a fall.  Patient states that she turned around wrong and fell landing on her hip.  Complaining of severe fear pain to her right hip.  Has not been able to ambulate since the fall.  Received IV fentanyl with EMS.  Not on anticoagulation.  Last ate at noon.  Denies any preceding symptoms of chest pain, shortness of breath or burning with urination.      Physical Exam   Triage Vital Signs: ED Triage Vitals  Enc Vitals Group     BP 09/20/22 1852 (!) 187/95     Pulse Rate 09/20/22 1852 76     Resp 09/20/22 1852 20     Temp 09/20/22 1852 97.8 F (36.6 C)     Temp Source 09/20/22 1852 Oral     SpO2 09/20/22 1852 97 %     Weight 09/20/22 1805 185 lb (83.9 kg)     Height --      Head Circumference --      Peak Flow --      Pain Score 09/20/22 1804 4     Pain Loc --      Pain Edu? --      Excl. in [place]? --     Most recent vital signs: Vitals:   09/20/22 1852  BP: (!) 187/95  Pulse: 76  Resp: 20  Temp: 97.8 F (36.6 C)  SpO2: 97%    Physical Exam Constitutional:      General: She is in acute distress.     Appearance: She is well-developed.  HENT:     Head: Atraumatic.  Eyes:     Conjunctiva/sclera: Conjunctivae normal.  Cardiovascular:     Rate and Rhythm: Regular rhythm.  Pulmonary:     Effort: No respiratory distress.  Abdominal:     General: There is no distension.  Musculoskeletal:     Cervical back: Normal range of motion.     Comments: Right leg is shortened and rotated.  +2 DP pulses.  Sensation intact.  Skin:    General: Skin is warm.  Neurological:     Mental Status: She is alert. Mental status is at baseline.          IMPRESSION / MDM / ASSESSMENT AND PLAN / ED COURSE  I reviewed the  triage vital signs and the nursing notes.  88 year old female presents to the emergency department following a nonsyncopal fall.  On arrival patient afebrile and hemodynamically stable.  Patient arrived via EMS.  Received IV fentanyl with EMS.  On arrival in obvious distress.  Patient with concern for acute hip fracture, dislocation or musculoskeletal strain.  Given IV fentanyl.     EKG  RADIOLOGY [My interpretation of imaging: X-ray showed comminuted right hip fracture.]  Read as acute comminuted and dislocated right intertrochanteric hip fracture.    ED Results / Procedures / Treatments   Labs (all labs ordered are listed, but only abnormal results are displayed) Labs interpreted as -    Labs Reviewed  CBC WITH DIFFERENTIAL/PLATELET - Abnormal; Notable for the following components:      Result Value   RBC 3.70 (*)    Hemoglobin 11.8 (*)    HCT 35.4 (*)  All other components within normal limits  BASIC METABOLIC PANEL - Abnormal; Notable for the following components:   Sodium 133 (*)    Glucose, Bld 117 (*)    Creatinine, Ser 1.67 (*)    GFR, Estimated 28 (*)    All other components within normal limits    Patient given multiple doses of IV pain medication.  Consulted orthopedic surgery.  Consulted hospitalist for admission.  Discussed with orthopedic surgery Dr. Mack Guise who recommended admission to the hospitalist.  N.p.o. at midnight with plan for operation tomorrow.  After consultation with the hospitalist recommended also giving IV ketorolac.  PROCEDURES:  Critical Care performed: No  Procedures  Patient's presentation is most consistent with acute presentation with potential threat to life or bodily function.   MEDICATIONS ORDERED IN ED: Medications  0.9 %  sodium chloride infusion ( Intravenous New Bag/Given 09/20/22 2102)  fentaNYL (SUBLIMAZE) injection 50 mcg (has no administration in time range)  ketorolac (TORADOL) 15 MG/ML injection 15 mg (has no  administration in time range)  ceFAZolin (ANCEF) IVPB 2g/100 mL premix (has no administration in time range)  fentaNYL (SUBLIMAZE) injection 50 mcg (50 mcg Intravenous Given 09/20/22 2102)  morphine (PF) 4 MG/ML injection 4 mg (4 mg Intravenous Given 09/20/22 2126)  morphine (PF) 4 MG/ML injection 4 mg (4 mg Intravenous Given 09/20/22 2156)    FINAL CLINICAL IMPRESSION(S) / ED DIAGNOSES   Final diagnoses:  None     Rx / DC Orders   ED Discharge Orders     None        Note:  This document was prepared using Dragon voice recognition software and may include unintentional dictation errors.   Nathaniel Man, MD 09/20/22 2237

## 2022-09-20 NOTE — ED Triage Notes (Signed)
Pt BIB EMS a fall that happened today. Pt denies hitting her head or LOC. Pt endorse right hip pain. Pt received 93mg of fentanyl enroute.

## 2022-09-20 NOTE — ED Notes (Signed)
Attempted to call patients son with no answer

## 2022-09-21 ENCOUNTER — Inpatient Hospital Stay: Payer: Medicare HMO

## 2022-09-21 ENCOUNTER — Encounter: Payer: Self-pay | Admitting: Internal Medicine

## 2022-09-21 ENCOUNTER — Other Ambulatory Visit: Payer: Self-pay

## 2022-09-21 ENCOUNTER — Encounter
Admission: EM | Disposition: A | Discharge status: Skilled Nursing Facility | Payer: Self-pay | Source: Home / Self Care | Attending: Osteopathic Medicine

## 2022-09-21 ENCOUNTER — Inpatient Hospital Stay: Payer: Medicare HMO | Admitting: Anesthesiology

## 2022-09-21 DIAGNOSIS — Z96653 Presence of artificial knee joint, bilateral: Secondary | ICD-10-CM | POA: Diagnosis present

## 2022-09-21 DIAGNOSIS — E222 Syndrome of inappropriate secretion of antidiuretic hormone: Secondary | ICD-10-CM | POA: Diagnosis present

## 2022-09-21 DIAGNOSIS — R338 Other retention of urine: Secondary | ICD-10-CM | POA: Diagnosis not present

## 2022-09-21 DIAGNOSIS — Z66 Do not resuscitate: Secondary | ICD-10-CM | POA: Diagnosis present

## 2022-09-21 DIAGNOSIS — Y92019 Unspecified place in single-family (private) house as the place of occurrence of the external cause: Secondary | ICD-10-CM | POA: Diagnosis not present

## 2022-09-21 DIAGNOSIS — N2581 Secondary hyperparathyroidism of renal origin: Secondary | ICD-10-CM | POA: Diagnosis present

## 2022-09-21 DIAGNOSIS — I959 Hypotension, unspecified: Secondary | ICD-10-CM | POA: Diagnosis not present

## 2022-09-21 DIAGNOSIS — S72141A Displaced intertrochanteric fracture of right femur, initial encounter for closed fracture: Secondary | ICD-10-CM | POA: Diagnosis present

## 2022-09-21 DIAGNOSIS — E861 Hypovolemia: Secondary | ICD-10-CM | POA: Diagnosis not present

## 2022-09-21 DIAGNOSIS — E039 Hypothyroidism, unspecified: Secondary | ICD-10-CM | POA: Diagnosis present

## 2022-09-21 DIAGNOSIS — E873 Alkalosis: Secondary | ICD-10-CM | POA: Diagnosis present

## 2022-09-21 DIAGNOSIS — N1831 Chronic kidney disease, stage 3a: Secondary | ICD-10-CM | POA: Diagnosis present

## 2022-09-21 DIAGNOSIS — N179 Acute kidney failure, unspecified: Secondary | ICD-10-CM | POA: Diagnosis not present

## 2022-09-21 DIAGNOSIS — R9431 Abnormal electrocardiogram [ECG] [EKG]: Secondary | ICD-10-CM | POA: Diagnosis present

## 2022-09-21 DIAGNOSIS — I10 Essential (primary) hypertension: Secondary | ICD-10-CM | POA: Diagnosis not present

## 2022-09-21 DIAGNOSIS — Z853 Personal history of malignant neoplasm of breast: Secondary | ICD-10-CM | POA: Diagnosis not present

## 2022-09-21 DIAGNOSIS — Z8249 Family history of ischemic heart disease and other diseases of the circulatory system: Secondary | ICD-10-CM | POA: Diagnosis not present

## 2022-09-21 DIAGNOSIS — R339 Retention of urine, unspecified: Secondary | ICD-10-CM | POA: Diagnosis not present

## 2022-09-21 DIAGNOSIS — W1830XA Fall on same level, unspecified, initial encounter: Secondary | ICD-10-CM | POA: Diagnosis present

## 2022-09-21 DIAGNOSIS — S79911A Unspecified injury of right hip, initial encounter: Secondary | ICD-10-CM | POA: Diagnosis present

## 2022-09-21 DIAGNOSIS — Z85820 Personal history of malignant melanoma of skin: Secondary | ICD-10-CM | POA: Diagnosis not present

## 2022-09-21 DIAGNOSIS — S72144A Nondisplaced intertrochanteric fracture of right femur, initial encounter for closed fracture: Secondary | ICD-10-CM

## 2022-09-21 DIAGNOSIS — T40415A Adverse effect of fentanyl or fentanyl analogs, initial encounter: Secondary | ICD-10-CM | POA: Diagnosis not present

## 2022-09-21 DIAGNOSIS — R0603 Acute respiratory distress: Secondary | ICD-10-CM | POA: Diagnosis not present

## 2022-09-21 DIAGNOSIS — D62 Acute posthemorrhagic anemia: Secondary | ICD-10-CM | POA: Diagnosis not present

## 2022-09-21 DIAGNOSIS — H9190 Unspecified hearing loss, unspecified ear: Secondary | ICD-10-CM | POA: Diagnosis present

## 2022-09-21 DIAGNOSIS — E785 Hyperlipidemia, unspecified: Secondary | ICD-10-CM | POA: Diagnosis present

## 2022-09-21 DIAGNOSIS — T502X5A Adverse effect of carbonic-anhydrase inhibitors, benzothiadiazides and other diuretics, initial encounter: Secondary | ICD-10-CM | POA: Diagnosis not present

## 2022-09-21 DIAGNOSIS — E871 Hypo-osmolality and hyponatremia: Secondary | ICD-10-CM | POA: Diagnosis not present

## 2022-09-21 DIAGNOSIS — I440 Atrioventricular block, first degree: Secondary | ICD-10-CM | POA: Diagnosis present

## 2022-09-21 DIAGNOSIS — I129 Hypertensive chronic kidney disease with stage 1 through stage 4 chronic kidney disease, or unspecified chronic kidney disease: Secondary | ICD-10-CM | POA: Diagnosis present

## 2022-09-21 HISTORY — PX: INTRAMEDULLARY (IM) NAIL INTERTROCHANTERIC: SHX5875

## 2022-09-21 LAB — BASIC METABOLIC PANEL
Anion gap: 11 (ref 5–15)
BUN: 22 mg/dL (ref 8–23)
CO2: 23 mmol/L (ref 22–32)
Calcium: 8.2 mg/dL — ABNORMAL LOW (ref 8.9–10.3)
Chloride: 96 mmol/L — ABNORMAL LOW (ref 98–111)
Creatinine, Ser: 1.55 mg/dL — ABNORMAL HIGH (ref 0.44–1.00)
GFR, Estimated: 31 mL/min — ABNORMAL LOW (ref >60)
Glucose, Bld: 149 mg/dL — ABNORMAL HIGH (ref 70–99)
Potassium: 4.3 mmol/L (ref 3.5–5.1)
Sodium: 130 mmol/L — ABNORMAL LOW (ref 135–145)

## 2022-09-21 LAB — BLOOD GAS, VENOUS
Acid-base deficit: 0.6 mmol/L (ref 0.0–2.0)
Bicarbonate: 22.8 mmol/L (ref 20.0–28.0)
O2 Saturation: 27 %
Patient temperature: 37
pCO2, Ven: 32 mmHg — ABNORMAL LOW (ref 44–60)
pH, Ven: 7.46 — ABNORMAL HIGH (ref 7.25–7.43)
pO2, Ven: <31 mmHg — CL (ref 32–45)

## 2022-09-21 LAB — GLUCOSE, CAPILLARY: Glucose-Capillary: 135 mg/dL — ABNORMAL HIGH (ref 70–99)

## 2022-09-21 LAB — TROPONIN I (HIGH SENSITIVITY)
Troponin I (High Sensitivity): 17 ng/L (ref <18)
Troponin I (High Sensitivity): 14 ng/L (ref <18)

## 2022-09-21 LAB — D-DIMER, QUANTITATIVE: D-Dimer, Quant: 3.76 ug/mL-FEU — ABNORMAL HIGH (ref 0.00–0.50)

## 2022-09-21 SURGERY — FIXATION, FRACTURE, INTERTROCHANTERIC, WITH INTRAMEDULLARY ROD
Anesthesia: Spinal | Laterality: Right

## 2022-09-21 MED ORDER — LACTATED RINGERS IV SOLN: INTRAVENOUS | Status: DC | PRN

## 2022-09-21 MED ORDER — FENTANYL CITRATE (PF) 100 MCG/2ML IJ SOLN
INTRAMUSCULAR | Status: AC
  Filled 2022-09-21: qty 2

## 2022-09-21 MED ORDER — BUPIVACAINE-EPINEPHRINE (PF) 0.25% -1:200000 IJ SOLN
INTRAMUSCULAR | Status: AC
  Filled 2022-09-21: qty 30

## 2022-09-21 MED ORDER — MORPHINE SULFATE (PF) 2 MG/ML IV SOLN: 0.5000 mg | INTRAVENOUS | Status: DC | PRN

## 2022-09-21 MED ORDER — NALOXONE HCL 0.4 MG/ML IJ SOLN
0.4000 mg | INTRAMUSCULAR | Status: DC | PRN
  Administered 2022-09-21 (×2): 0.4 mg via INTRAVENOUS
  Filled 2022-09-21: qty 1

## 2022-09-21 MED ORDER — BUPIVACAINE LIPOSOME 1.3 % IJ SUSP
INTRAMUSCULAR | Status: AC
  Filled 2022-09-21: qty 20

## 2022-09-21 MED ORDER — METHOCARBAMOL 500 MG PO TABS
500.0000 mg | ORAL_TABLET | Freq: Four times a day (QID) | ORAL | Status: DC | PRN
  Administered 2022-09-23 – 2022-09-24 (×3): 500 mg via ORAL
  Filled 2022-09-21 (×3): qty 1

## 2022-09-21 MED ORDER — FENTANYL CITRATE (PF) 100 MCG/2ML IJ SOLN
INTRAMUSCULAR | Status: DC | PRN
  Administered 2022-09-21 (×4): 25 ug via INTRAVENOUS

## 2022-09-21 MED ORDER — NALOXONE HCL 0.4 MG/ML IJ SOLN
INTRAMUSCULAR | Status: AC
  Filled 2022-09-21: qty 1

## 2022-09-21 MED ORDER — ACETAMINOPHEN 325 MG PO TABS
325.0000 mg | ORAL_TABLET | Freq: Four times a day (QID) | ORAL | Status: DC | PRN
  Administered 2022-09-24: 650 mg via ORAL
  Administered 2022-09-24: 325 mg via ORAL
  Administered 2022-09-25: 650 mg via ORAL
  Filled 2022-09-21 (×3): qty 2

## 2022-09-21 MED ORDER — PHENYLEPHRINE HCL-NACL 20-0.9 MG/250ML-% IV SOLN
INTRAVENOUS | Status: DC | PRN
  Administered 2022-09-21: 30 ug/min via INTRAVENOUS

## 2022-09-21 MED ORDER — ONDANSETRON HCL 4 MG/2ML IJ SOLN: 4.0000 mg | Freq: Four times a day (QID) | INTRAMUSCULAR | Status: DC | PRN

## 2022-09-21 MED ORDER — KETOROLAC TROMETHAMINE 15 MG/ML IJ SOLN
15.0000 mg | Freq: Four times a day (QID) | INTRAMUSCULAR | Status: DC
  Administered 2022-09-21: 15 mg via INTRAVENOUS
  Filled 2022-09-21: qty 1

## 2022-09-21 MED ORDER — BUPIVACAINE HCL (PF) 0.5 % IJ SOLN
INTRAMUSCULAR | Status: DC | PRN
  Administered 2022-09-21: 2.4 mL

## 2022-09-21 MED ORDER — IPRATROPIUM-ALBUTEROL 0.5-2.5 (3) MG/3ML IN SOLN
RESPIRATORY_TRACT | Status: AC
  Administered 2022-09-21: 3 mL via RESPIRATORY_TRACT
  Filled 2022-09-21: qty 3

## 2022-09-21 MED ORDER — ALUM & MAG HYDROXIDE-SIMETH 200-200-20 MG/5ML PO SUSP
30.0000 mL | Freq: Once | ORAL | Status: AC
  Administered 2022-09-21: 30 mL via ORAL
  Filled 2022-09-21: qty 30

## 2022-09-21 MED ORDER — SODIUM CHLORIDE 0.45 % IV SOLN: INTRAVENOUS | Status: DC

## 2022-09-21 MED ORDER — SIMETHICONE 80 MG PO CHEW
80.0000 mg | CHEWABLE_TABLET | Freq: Once | ORAL | Status: AC
  Administered 2022-09-21: 80 mg via ORAL
  Filled 2022-09-21: qty 1

## 2022-09-21 MED ORDER — PHENYLEPHRINE 80 MCG/ML (10ML) SYRINGE FOR IV PUSH (FOR BLOOD PRESSURE SUPPORT)
PREFILLED_SYRINGE | INTRAVENOUS | Status: DC | PRN
  Administered 2022-09-21 (×3): 80 ug via INTRAVENOUS

## 2022-09-21 MED ORDER — TRAMADOL HCL 50 MG PO TABS
50.0000 mg | ORAL_TABLET | Freq: Four times a day (QID) | ORAL | Status: DC
  Administered 2022-09-21: 50 mg via ORAL
  Filled 2022-09-21: qty 1

## 2022-09-21 MED ORDER — CEFAZOLIN SODIUM-DEXTROSE 2-4 GM/100ML-% IV SOLN
2.0000 g | Freq: Four times a day (QID) | INTRAVENOUS | Status: AC
  Administered 2022-09-22: 2 g via INTRAVENOUS
  Filled 2022-09-21 (×2): qty 100

## 2022-09-21 MED ORDER — SODIUM CHLORIDE 0.9 % IV SOLN: INTRAVENOUS | Status: DC

## 2022-09-21 MED ORDER — PROPOFOL 500 MG/50ML IV EMUL
INTRAVENOUS | Status: DC | PRN
  Administered 2022-09-21: 30 ug/kg/min via INTRAVENOUS

## 2022-09-21 MED ORDER — ACETAMINOPHEN 10 MG/ML IV SOLN
1000.0000 mg | INTRAVENOUS | Status: AC
  Administered 2022-09-21: 1000 mg via INTRAVENOUS
  Filled 2022-09-21: qty 100

## 2022-09-21 MED ORDER — CEFAZOLIN SODIUM-DEXTROSE 2-4 GM/100ML-% IV SOLN
INTRAVENOUS | Status: AC
  Administered 2022-09-21: 2000 mg
  Filled 2022-09-21: qty 100

## 2022-09-21 MED ORDER — DOCUSATE SODIUM 100 MG PO CAPS
100.0000 mg | ORAL_CAPSULE | Freq: Two times a day (BID) | ORAL | Status: DC
  Administered 2022-09-21 – 2022-09-25 (×9): 100 mg via ORAL
  Filled 2022-09-21 (×9): qty 1

## 2022-09-21 MED ORDER — GLYCOPYRROLATE 0.2 MG/ML IJ SOLN
INTRAMUSCULAR | Status: DC | PRN
  Administered 2022-09-21: .2 mg via INTRAVENOUS

## 2022-09-21 MED ORDER — BISACODYL 10 MG RE SUPP
10.0000 mg | Freq: Every day | RECTAL | Status: DC | PRN
  Administered 2022-09-23: 10 mg via RECTAL
  Filled 2022-09-21: qty 1

## 2022-09-21 MED ORDER — HYDROCODONE-ACETAMINOPHEN 5-325 MG PO TABS
1.0000 | ORAL_TABLET | Freq: Four times a day (QID) | ORAL | Status: DC | PRN
  Administered 2022-09-21 – 2022-09-22 (×3): 1 via ORAL
  Filled 2022-09-21 (×4): qty 1

## 2022-09-21 MED ORDER — FENTANYL CITRATE (PF) 100 MCG/2ML IJ SOLN: 25.0000 ug | INTRAMUSCULAR | Status: DC | PRN

## 2022-09-21 MED ORDER — LEVOTHYROXINE SODIUM 50 MCG PO TABS
50.0000 ug | ORAL_TABLET | Freq: Every day | ORAL | Status: DC
  Administered 2022-09-21 – 2022-09-25 (×5): 50 ug via ORAL
  Filled 2022-09-21 (×5): qty 1

## 2022-09-21 MED ORDER — POLYETHYLENE GLYCOL 3350 17 G PO PACK
17.0000 g | PACK | Freq: Every day | ORAL | Status: DC | PRN
  Administered 2022-09-23: 17 g via ORAL
  Filled 2022-09-21: qty 1

## 2022-09-21 MED ORDER — HYDROCODONE-ACETAMINOPHEN 5-325 MG PO TABS
1.0000 | ORAL_TABLET | ORAL | Status: DC | PRN
  Administered 2022-09-21: 1 via ORAL
  Filled 2022-09-21: qty 1

## 2022-09-21 MED ORDER — ACETAMINOPHEN 10 MG/ML IV SOLN
INTRAVENOUS | Status: DC | PRN
  Administered 2022-09-21: 1000 mg via INTRAVENOUS

## 2022-09-21 MED ORDER — IRBESARTAN 150 MG PO TABS
75.0000 mg | ORAL_TABLET | Freq: Every day | ORAL | Status: DC
  Administered 2022-09-22 – 2022-09-23 (×2): 75 mg via ORAL
  Filled 2022-09-21 (×3): qty 1

## 2022-09-21 MED ORDER — IOHEXOL 350 MG/ML SOLN
60.0000 mL | Freq: Once | INTRAVENOUS | Status: AC | PRN
  Administered 2022-09-21: 60 mL via INTRAVENOUS

## 2022-09-21 MED ORDER — TRANEXAMIC ACID-NACL 1000-0.7 MG/100ML-% IV SOLN: 1000.0000 mg | Freq: Once | INTRAVENOUS | Status: AC

## 2022-09-21 MED ORDER — PROPOFOL 10 MG/ML IV BOLUS
INTRAVENOUS | Status: DC | PRN
  Administered 2022-09-21: 10 mg via INTRAVENOUS
  Administered 2022-09-21 (×2): 20 mg via INTRAVENOUS

## 2022-09-21 MED ORDER — METHOCARBAMOL 1000 MG/10ML IJ SOLN: 500.0000 mg | Freq: Four times a day (QID) | INTRAVENOUS | Status: DC | PRN

## 2022-09-21 MED ORDER — ACETAMINOPHEN 500 MG PO TABS
500.0000 mg | ORAL_TABLET | Freq: Four times a day (QID) | ORAL | Status: AC
  Administered 2022-09-21: 500 mg via ORAL
  Filled 2022-09-21 (×2): qty 1

## 2022-09-21 MED ORDER — TRANEXAMIC ACID-NACL 1000-0.7 MG/100ML-% IV SOLN
INTRAVENOUS | Status: AC
  Administered 2022-09-21: 1000 mg via INTRAVENOUS
  Filled 2022-09-21: qty 100

## 2022-09-21 MED ORDER — OXYCODONE HCL 5 MG/5ML PO SOLN: 5.0000 mg | Freq: Once | ORAL | Status: DC | PRN

## 2022-09-21 MED ORDER — ALUM & MAG HYDROXIDE-SIMETH 200-200-20 MG/5ML PO SUSP: 30.0000 mL | ORAL | Status: DC | PRN

## 2022-09-21 MED ORDER — ENOXAPARIN SODIUM 30 MG/0.3ML IJ SOSY
30.0000 mg | PREFILLED_SYRINGE | INTRAMUSCULAR | Status: DC
  Administered 2022-09-22 – 2022-09-25 (×4): 30 mg via SUBCUTANEOUS
  Filled 2022-09-21 (×4): qty 0.3

## 2022-09-21 MED ORDER — ACETAMINOPHEN 10 MG/ML IV SOLN
INTRAVENOUS | Status: AC
  Filled 2022-09-21: qty 100

## 2022-09-21 MED ORDER — CHLORHEXIDINE GLUCONATE 0.12 % MT SOLN
OROMUCOSAL | Status: AC
  Filled 2022-09-21: qty 15

## 2022-09-21 MED ORDER — IPRATROPIUM-ALBUTEROL 0.5-2.5 (3) MG/3ML IN SOLN: 3.0000 mL | RESPIRATORY_TRACT | Status: AC

## 2022-09-21 MED ORDER — ONDANSETRON HCL 4 MG PO TABS: 4.0000 mg | ORAL_TABLET | Freq: Four times a day (QID) | ORAL | Status: DC | PRN

## 2022-09-21 MED ORDER — CEFAZOLIN SODIUM-DEXTROSE 2-3 GM-%(50ML) IV SOLR
INTRAVENOUS | Status: DC | PRN
  Administered 2022-09-21: 2 g via INTRAVENOUS

## 2022-09-21 MED ORDER — OXYCODONE HCL 5 MG PO TABS: 5.0000 mg | ORAL_TABLET | Freq: Once | ORAL | Status: DC | PRN

## 2022-09-21 MED ORDER — SENNA 8.6 MG PO TABS
1.0000 | ORAL_TABLET | Freq: Two times a day (BID) | ORAL | Status: DC
  Administered 2022-09-21 – 2022-09-25 (×8): 8.6 mg via ORAL
  Filled 2022-09-21 (×8): qty 1

## 2022-09-21 MED ORDER — HEPARIN SODIUM (PORCINE) 5000 UNIT/ML IJ SOLN
5000.0000 [IU] | Freq: Three times a day (TID) | INTRAMUSCULAR | Status: DC
  Administered 2022-09-21: 5000 [IU] via SUBCUTANEOUS
  Filled 2022-09-21: qty 1

## 2022-09-21 SURGICAL SUPPLY — 42 items
BIT DRILL CROWE POINT TWST 4.3 (DRILL) IMPLANT
BNDG CMPR 5X6 CHSV STRCH STRL (GAUZE/BANDAGES/DRESSINGS) ×2
BNDG COHESIVE 6X5 TAN ST LF (GAUZE/BANDAGES/DRESSINGS) ×2 IMPLANT
CORTICAL BONE SCR 5.0MM X 48MM (Screw) ×1 IMPLANT
DRAPE 3/4 80X56 (DRAPES) ×2 IMPLANT
DRAPE SURG 17X11 SM STRL (DRAPES) ×2 IMPLANT
DRAPE U-SHAPE 47X51 STRL (DRAPES) ×2 IMPLANT
DRILL CROWE POINT TWIST 4.3 (DRILL) ×1
DRSG OPSITE POSTOP 3X4 (GAUZE/BANDAGES/DRESSINGS) ×2 IMPLANT
DRSG OPSITE POSTOP 4X14 (GAUZE/BANDAGES/DRESSINGS) IMPLANT
DRSG OPSITE POSTOP 4X6 (GAUZE/BANDAGES/DRESSINGS) ×1 IMPLANT
DURAPREP 26ML APPLICATOR (WOUND CARE) ×2 IMPLANT
ELECT REM PT RETURN 9FT ADLT (ELECTROSURGICAL) ×1
ELECTRODE REM PT RTRN 9FT ADLT (ELECTROSURGICAL) ×1 IMPLANT
GLOVE BIOGEL PI IND STRL 9 (GLOVE) ×1 IMPLANT
GLOVE BIOGEL PI ORTHO SZ9 (GLOVE) ×4 IMPLANT
GOWN STRL REUS TWL 2XL XL LVL4 (GOWN DISPOSABLE) ×1 IMPLANT
GOWN STRL REUS W/ TWL LRG LVL3 (GOWN DISPOSABLE) ×1 IMPLANT
GOWN STRL REUS W/TWL LRG LVL3 (GOWN DISPOSABLE) ×1
GUIDEPIN VERSANAIL DSP 3.2X444 (ORTHOPEDIC DISPOSABLE SUPPLIES) IMPLANT
GUIDEWIRE BALL NOSE 100CM (WIRE) IMPLANT
HEMOVAC 400CC 10FR (MISCELLANEOUS) IMPLANT
HFN LAG SCREW 10.5MM X 115MM (Orthopedic Implant) IMPLANT
KIT TURNOVER CYSTO (KITS) ×1 IMPLANT
MANIFOLD NEPTUNE II (INSTRUMENTS) ×1 IMPLANT
MAT ABSORB  FLUID 56X50 GRAY (MISCELLANEOUS) ×1
MAT ABSORB FLUID 56X50 GRAY (MISCELLANEOUS) ×1 IMPLANT
NAIL HIP FRACTURE 11X380MM (Nail) IMPLANT
NS IRRIG 500ML POUR BTL (IV SOLUTION) ×1 IMPLANT
PACK HIP COMPR (MISCELLANEOUS) ×1 IMPLANT
PAD ARMBOARD 7.5X6 YLW CONV (MISCELLANEOUS) ×1 IMPLANT
SCREW BONE CORTICAL 5.0X44 (Screw) IMPLANT
SCREW CORTICL BON 5.0MM X 48MM (Screw) IMPLANT
STAPLER SKIN PROX 35W (STAPLE) ×1 IMPLANT
SUCTION FRAZIER HANDLE 10FR (MISCELLANEOUS) ×1
SUCTION TUBE FRAZIER 10FR DISP (MISCELLANEOUS) ×1 IMPLANT
SUT VIC AB 0 CT1 36 (SUTURE) ×2 IMPLANT
SUT VIC AB 2-0 CT1 27 (SUTURE) ×1
SUT VIC AB 2-0 CT1 TAPERPNT 27 (SUTURE) ×1 IMPLANT
SUT VICRYL 0 AB UR-6 (SUTURE) ×1 IMPLANT
TRAP FLUID SMOKE EVACUATOR (MISCELLANEOUS) ×1 IMPLANT
WATER STERILE IRR 500ML POUR (IV SOLUTION) ×1 IMPLANT

## 2022-09-21 NOTE — Assessment & Plan Note (Signed)
Patient had a non-syncopal fall. She was unable to bear weight and had pain. ED eval revealed right intertrochanteric fracture comminuted and displaced. EDP contacted ortho on-call  Plan TRH to admit  NPO /p MN

## 2022-09-21 NOTE — Progress Notes (Signed)
       CROSS COVER NOTE  NAME: Pamela Paul MRN: [id number] DOB : [birth year]    Date of Service   DATE10/19/23  HPI/Events of Note   Patient is s/p intramedullary fixation for right displaced intertrochanteric hip fracture today who developed respiratory distress after receiving fentanyl for treatment of her hip pain PMH includes but not limited to mitral valve cardiac disease, breast cancer with lumpectomy and node excision 1 year ago (no chemo) Patient complained of chest burning never experienced before  Assessment and  Interventions   Assessment: Neuro - responding slowly, pinpoint pupils Resp - deep sonorous respiration CV - digits cool, pale  CBG 135 Narcan - 0.4 mg initial improvement in neuro and resp function but then needed repeat dose Faste deep respirations remained despite patient stating she felt better. Neuro function with great improvement; + belching Plan: Duoneb given for wheezing Maalox + simethicone BMP, Ddimer, vbg, EKG, troponin No venipunture or BP right arm  VBG with respiratory alkalosis -  7.46 -pCO2 32, HCO3 22.8 Ddimer elevated 3.76 and Wells score for DVT EKG with too much artifact to interpret. Tele placed, ST with prolonged QT   Update 10/20 0550 am Mag 1.6 - 4 gms ordered Phos normal HGB 7.2 - prior am of 10/18 was 11.8 - transfuse 1 unit       Kathlene Cote NP [facility]

## 2022-09-21 NOTE — Anesthesia Procedure Notes (Signed)
Spinal  Patient location during procedure: OR Start time: 09/21/2022 12:54 PM Reason for block: surgical anesthesia Staffing Performed: resident/CRNA  Performed by: Lerry Liner, CRNA Authorized by: Arita Miss, MD   Preanesthetic Checklist Completed: patient identified, IV checked, site marked, risks and benefits discussed, surgical consent, monitors and equipment checked, pre-op evaluation and timeout performed Spinal Block Patient position: right lateral decubitus Prep: ChloraPrep Patient monitoring: heart rate, cardiac monitor, continuous pulse ox and blood pressure Approach: midline Location: L3-4 Injection technique: single-shot Needle Needle type: Sprotte  Needle gauge: 24 G Needle length: 9 cm Assessment Sensory level: T4 Events: CSF return Additional Notes Atraumatic attempt x1.  Negative heme, negative patesthesia, no pain with injection.  Free flow CSF pre/post injection.

## 2022-09-21 NOTE — Progress Notes (Signed)
       CROSS COVER NOTE  NAME: Tennille Montelongo MRN: [id number] DOB : [birth year]    Date of Service   DATE10/19/23  HPI/Events of Note   Patient is s/p intramedullary fixation for right displaced intertrochanteric hip fracture today who developed respiratory distress after receiving fentanyl for treatment of her hip pain PMH includes but not limited to mitral valve cardiac disease, breast cancer with lumpectomy and node excision 1 year ago (no chemo) Patient complained of chest burning never experienced before  Assessment and  Interventions   Assessment: Neuro - responding slowly, pinpoint pupils Resp - deep sonorous respiration CV - digits cool, pale  CBG 135 Narcan - 0.4 mg initial improvement in neuro and resp function but then needed repeat dose Faste deep respirations remained despite patient stating she felt better. Neuro function with great improvement; + belching Plan: Duoneb given for wheezing Maalox + simethicone BMP, Ddimer, vbg, EKG, troponin No venipunture or BP right arm  VBG with respiratory alkalosis -  7.46 -pCO2 32, HCO3 22.8 Ddimer elevated 3.76 and Wells score for PE 7, high risk EKG with too much artifact to interpret. Tele placed, ST with prolonged QT, prior EKG SR 64 with 1st degree AVB and PACs  CT angio chest r/o PE IMPRESSION: 1. No acute intrathoracic pathology. No CT evidence of pulmonary artery embolus. 2. A 5 mm subpleural nodule in the superior segment of the left lower lobe. No follow-up needed if patient is low-risk.This recommendation follows the consensus statement: Guidelines for Management of Incidental Pulmonary Nodules Detected on CT Images: From the Fleischner Society 2017; Radiology 2017; 284:228-243. 3. Age indeterminate, likely chronic compression fracture of superior endplate of T3 with approximately 30% loss of vertebral body height and anterior wedging. Correlation with clinical exam and point tenderness recommended. 4. Fatty  liver. 5.  Aortic Atherosclerosis (ICD10-I70.0).  Troponin not elevated Continue monitoring for pain anxiety Incentive spirometry and progressive mobility as high risk for PNA (prior incidence of post orthopedic surgery)        Kathlene Cote NP [facility]

## 2022-09-21 NOTE — Progress Notes (Signed)
Rapid Response Event Note   Reason for Call : shortness of breath   Initial Focused Assessment: On my arrival pt is on nonrebreather. RT at bedside. Primary RN states that pt went into respiratory distress after 68mg of fentanyl given for pain. Pt is alert on my arrival but very shob and c/o burning in chest. Pt having tremors as well. HR is slightly elevated, BP stable. O2 sats 100%.   Interventions: BSharion Settler NP arrived to unit. 0.4 mg of Narcan administered per order. Pt became more alert and aware of what was going on. Pt remained shob. VSS. Nonrebreather was able to be removed and pt on room air. CBG checked and normal. 12 lead EKG performed. 0.4 mg of Narcan given for another dose. Pt c/o some hip pain and dry mouth. Taking small sips of water. Pt placed on telemetry. All VSS and pt states she is feeling better. IV tylenol ordered for pain. Breathing has improved some. Lab at bedside.   Plan of Care: Per BHassan Rowan NP will keep pt on  1A at this time on telemetry and wait for lab work to result and see if breathing further improves. Will follow up on pt. Primary RN aware to call with any changes.    Event Summary:   MD Notified: BSharion Settler NP Call Time: [phone] Time: 1916 End Time: 2015  MTrellis Paganini RN

## 2022-09-21 NOTE — Consult Note (Signed)
ORTHOPAEDIC CONSULTATION  REQUESTING PHYSICIAN: Nathaniel Man, MD  Chief Complaint: Right comminuted intertrochanteric hip fracture  HPI: Pamela Paul is a 88 y.o. female who complains of right hip fracture after a fall.  Patient was seen at approximately 10:30 AM this morning in the ER with her family at the bedside.  Patient was currently comfortable but had significant pain overnight.  Patient was brought to the [facility] emergency department where x-rays demonstrated a comminuted right intertrochanteric hip fracture.  Orthopedics is consulted for management of the fracture.  Patient has been admitted to hospitalist service for preoperative evaluation.  Past Medical History:  Diagnosis Date   Breast cancer ([place])    CKD (chronic kidney disease) stage 3, GFR 30-59 ml/min (HCC)    Heart disease    mitral valvular   Hyperlipidemia    Hyperparathyroidism, secondary ([place])    Hypertension    Hypothyroidism    Spinal stenosis of lumbar region 2006   s/p surgery   Past Surgical History:  Procedure Laterality Date   ABDOMINAL HYSTERECTOMY     bilateral cataract surgery     BREAST LUMPECTOMY Right 07/22/2021   CHOLECYSTECTOMY  12/04/1978   FRACTURE SURGERY Left 12/29/2018   FEMUR   JOINT REPLACEMENT     Bilateral knee   LAPAROSCOPIC SIGMOID COLECTOMY  12/04/2009   secondary to benign mass   MELANOMA EXCISION WITH SENTINEL LYMPH NODE BIOPSY Right 07/22/2021   Procedure: MELANOMA EXCISION WITH SENTINEL LYMPH NODE BIOPSY;  Surgeon: Herbert Pun, MD;  Location: [facility] ORS;  Service: General;  Laterality: Right;   PART [place] Right 07/22/2021   Procedure: PART MASTECTOMY,RADIO FREQUENCY LOCALIZER,AXILLARY SENTINEL NODE BIOPSY;  Surgeon: Herbert Pun, MD;  Location: [facility] ORS;  Service: General;  Laterality: Right;   REPLACEMENT TOTAL KNEE BILATERAL     SMALL INTESTINE SURGERY  12/04/2008   for SBO    SPINE SURGERY  12/04/2004   rods, [facility]   tendon repair     achille heall, left    Social History   Socioeconomic History   Marital status: Widowed    Spouse name: Not on file   Number of children: 5   Years of education: Not on file   Highest education level: Not on file  Occupational History   Occupation: Preschool teacher/director    Comment: Retired  Tobacco Use   Smoking status: Former    Types: Cigarettes    Quit date: 03/15/1961    Years since quitting: 61.5   Smokeless tobacco: Never   Tobacco comments:    Quit many years ago.  Vaping Use   Vaping Use: Never used  Substance and Sexual Activity   Alcohol use: Not Currently   Drug use: No   Sexual activity: Never  Other Topics Concern   Not on file  Social History Narrative   2 daughters/3 sons      Has living will   Son Shanon Brow is [facility] POA   Has DNR   Would accept hospital care   Would consider tube feeds--but not if persistent cognitive unawareness   Social Determinants of Health   Financial Resource Strain: Low Risk  (03/02/2022)   Overall Financial Resource Strain (CARDIA)    Difficulty of Paying Living Expenses: Not hard at all  Food Insecurity: No Food Insecurity (03/02/2022)   Hunger Vital Sign    Worried About Running Out of Food in the Last Year: Never true    Ran Out of Food in the Last  Year: Never true  Transportation Needs: No Transportation Needs (03/02/2022)   PRAPARE - Hydrologist (Medical): No    Lack of Transportation (Non-Medical): No  Physical Activity: Insufficiently Active (03/02/2022)   Exercise Vital Sign    Days of Exercise per Week: 2 days    Minutes of Exercise per Session: 60 min  Stress: No Stress Concern Present (03/02/2022)   [place]    Feeling of Stress : Not at all  Social Connections: Unknown (03/02/2022)   Social Connection and Isolation Panel [NHANES]    Frequency of  Communication with Friends and Family: More than three times a week    Frequency of Social Gatherings with Friends and Family: Twice a week    Attends Religious Services: Not on Advertising copywriter or Organizations: Yes    Attends Archivist Meetings: Not on file    Marital Status: Widowed   Family History  Problem Relation Age of Onset   Arthritis Mother    Stroke Mother    Hyperlipidemia Father    Cancer Father    Heart disease Paternal Aunt    Allergies  Allergen Reactions   Bactrim [Sulfamethoxazole-Trimethoprim] Nausea And Vomiting    Lethargic and weakness   Prior to Admission medications   Medication Sig Start Date End Date Taking? Authorizing Provider  levothyroxine (SYNTHROID) 50 MCG tablet TAKE 1 TABLET EVERY DAY ON EMPTY STOMACHWITH A GLASS OF WATER AT LEAST 30-60 MINBEFORE BREAKFAST 11/02/21  Yes Venia Carbon, MD  Multiple Vitamins-Minerals (PRESERVISION AREDS PO) Take 1 capsule by mouth 2 (two) times daily.   Yes [provider]  telmisartan (MICARDIS) 20 MG tablet TAKE 1 TABLET BY MOUTH DAILY 05/30/22  Yes Venia Carbon, MD  Calcium Carbonate-Vitamin D3 600-400 MG-UNIT TABS Take 1 tablet by mouth 2 (two) times daily.    [provider]  Magnesium Oxide 250 MG TABS Take 1 tablet by mouth every other day. At bedtime    [provider]   DG Chest 1 View  Result Date: 09/20/2022 CLINICAL DATA:  Fall EXAM: CHEST  1 VIEW COMPARISON:  09/27/2020 FINDINGS: Low lung volumes. No acute airspace disease or effusion. Upper normal cardiomediastinal silhouette augmented by low lung volume. No pneumothorax IMPRESSION: No active disease.  Low lung volume Electronically Signed   By: Donavan Foil M.D.   On: 09/20/2022 18:42   DG Hip Unilat W or Wo Pelvis 2-3 Views Right  Result Date: 09/20/2022 CLINICAL DATA:  Hip pain with fall EXAM: DG HIP (WITH OR WITHOUT PELVIS) 2-3V RIGHT COMPARISON:  None Available. FINDINGS: Hardware in  the lower lumbar spine. Pubic symphysis and rami appear intact. Multiple clips in the pelvis. Acute comminuted intertrochanteric fracture with displacement. No femoral head dislocation IMPRESSION: Acute comminuted and displaced right intertrochanteric fracture Electronically Signed   By: Donavan Foil M.D.   On: 09/20/2022 18:41    Positive ROS: All other systems have been reviewed and were otherwise negative with the exception of those mentioned in the HPI and as above.  Physical Exam: General: Alert, no acute distress  MUSCULOSKELETAL: Right lower extremity: Patient skin is intact.  There is no erythema ecchymosis or significant swelling.  Compartments are soft and compressible.  She has shortening and external rotation of the right lower extremity.  Patient has palpable pedal pulses, intact station light touch and intact motor function distally.  Assessment: Right comminuted, displaced  intertrochanteric hip fracture  Plan: I explained to the patient and her family the nature of her fracture.  I have recommended intramedullary fixation for her right intertrochanteric hip fracture.  I reviewed the details of the operation as well as the postoperative course.  I discussed the risks and benefits of surgery. The risks include but are not limited to infection, bleeding requiring blood transfusion, nerve or blood vessel injury, joint stiffness or loss of motion, persistent pain, weakness or instability, shortening of the right lower extremity or change in lower extremity rotation, malunion, nonunion and hardware failure and the need for further surgery. Medical risks include but are not limited to DVT and pulmonary embolism, myocardial infarction, stroke, pneumonia, respiratory failure and death. Patient and her family understood these risks and wished to proceed.   Patient has been n.p.o. after midnight.  I have reviewed the patient's x-rays and labs in preparation for this case.  Patient has been  cleared medically for surgery.   Thornton Park, MD    09/21/2022 12:26 PM

## 2022-09-21 NOTE — Anesthesia Preprocedure Evaluation (Addendum)
Anesthesia Evaluation  Patient identified by MRN, date of birth, ID band Patient awake    Reviewed: Allergy & Precautions, NPO status , Patient's Chart, lab work & pertinent test results  History of Anesthesia Complications Negative for: history of anesthetic complications  Airway Mallampati: II  TM Distance: >3 FB Neck ROM: full    Dental  (+) Teeth Intact   Pulmonary neg pulmonary ROS, former smoker,    Pulmonary exam normal        Cardiovascular hypertension, On Medications negative cardio ROS Normal cardiovascular exam     Neuro/Psych negative neurological ROS  negative psych ROS   GI/Hepatic negative GI ROS, Neg liver ROS,   Endo/Other  Hypothyroidism   Renal/GU Renal disease     Musculoskeletal   Abdominal   Peds  Hematology negative hematology ROS (+)   Anesthesia Other Findings Past Medical History: No date: Breast cancer (HCC) No date: CKD (chronic kidney disease) stage 3, GFR 30-59 ml/min (HCC) No date: Heart disease     Comment:  mitral valvular No date: Hyperlipidemia No date: Hyperparathyroidism, secondary ([place]) No date: Hypertension No date: Hypothyroidism 2006: Spinal stenosis of lumbar region     Comment:  s/p surgery  Past Surgical History: No date: ABDOMINAL HYSTERECTOMY No date: bilateral cataract surgery 07/22/2021: BREAST LUMPECTOMY; Right 12/04/1978: CHOLECYSTECTOMY 12/29/2018: FRACTURE SURGERY; Left     Comment:  FEMUR No date: JOINT REPLACEMENT     Comment:  Bilateral knee 12/04/2009: LAPAROSCOPIC SIGMOID COLECTOMY     Comment:  secondary to benign mass 07/22/2021: MELANOMA EXCISION WITH SENTINEL LYMPH NODE BIOPSY; Right     Comment:  Procedure: MELANOMA EXCISION WITH SENTINEL LYMPH NODE               BIOPSY;  Surgeon: Herbert Pun, MD;  Location:               [facility] ORS;  Service: General;  Laterality: Right; 07/22/2021: PART MASTECTOMY,RADIO FREQUENCY  LOCALIZER,AXILLARY  SENTINEL NODE BIOPSY; Right     Comment:  Procedure: PART [place] NODE BIOPSY;  Surgeon:               Herbert Pun, MD;  Location: [facility] ORS;  Service:              General;  Laterality: Right; No date: REPLACEMENT TOTAL KNEE BILATERAL 12/04/2008: SMALL INTESTINE SURGERY     Comment:  for SBO 12/04/2004: SPINE SURGERY     Comment:  rods, [facility] No date: tendon repair     Comment:  achille heall, left   BMI    Body Mass Index: 30.78 kg/m      Reproductive/Obstetrics negative OB ROS                            Anesthesia Physical Anesthesia Plan  ASA: 2  Anesthesia Plan: Spinal   Post-op Pain Management: Ofirmev IV (intra-op)*   Induction: Intravenous  PONV Risk Score and Plan: 3 and Ondansetron, Dexamethasone and Treatment may vary due to age or medical condition  Airway Management Planned: Natural Airway  Additional Equipment: None  Intra-op Plan:   Post-operative Plan:   Informed Consent: I have reviewed the patients History and Physical, chart, labs and discussed the procedure including the risks, benefits and alternatives for the proposed anesthesia with the patient or authorized representative who has indicated his/her understanding and  acceptance.     Dental Advisory Given  Plan Discussed with: Anesthesiologist, CRNA and Surgeon  Anesthesia Plan Comments: (Discussed R/B/A of neuraxial anesthesia technique with patient and children at bedside: - rare risks of spinal/epidural hematoma, nerve damage, infection - Risk of PDPH - Risk of nausea and vomiting - Risk of conversion to general anesthesia and its associated risks, including sore throat, damage to lips/eyes/teeth/oropharynx, and rare risks such as cardiac and respiratory events. - Risk of allergic reactions - post operative cognitive dysfunction  Discussed the role of CRNA in patient's  perioperative care.  Patient voiced understanding.  Patient has existing L5-S1 or L4-L5 fusion , unsure which. I told her we would do spinal above that level and it shouldn't pose an issue. She understood and agreed.)       Anesthesia Quick Evaluation

## 2022-09-21 NOTE — Assessment & Plan Note (Signed)
BP adequately controlled  Plan Continue home medications

## 2022-09-21 NOTE — Assessment & Plan Note (Signed)
Last TSH 08/17/21 with good control.  Plan  TSH  Continue present dose of synthroid - adjust dose as needed based on lab

## 2022-09-21 NOTE — Transfer of Care (Signed)
Immediate Anesthesia Transfer of Care Note  Patient: Pamela Paul  Procedure(s) Performed: INTRAMEDULLARY (IM) NAIL INTERTROCHANTERIC (Right)  Patient Location: PACU  Anesthesia Type:sab  Level of Consciousness: drowsy  Airway & Oxygen Therapy: Patient Spontanous Breathing and Patient connected to face mask oxygen  Post-op Assessment: Report given to RN  Post vital signs: stable  Last Vitals:  Vitals Value Taken Time  BP 148/78 09/21/22 1447  Temp 36.7 C 09/21/22 1442  Pulse 55 09/21/22 1449  Resp 12 09/21/22 1449  SpO2 100 % 09/21/22 1449  Vitals shown include unvalidated device data.  Last Pain:  Vitals:   09/21/22 1442  TempSrc:   PainSc: 0-No pain      Patients Stated Pain Goal: 0 (76/28/31 5176)  Complications: No notable events documented.

## 2022-09-21 NOTE — Op Note (Signed)
09/21/2022  3:08 PM  PATIENT:  Pamela Paul    PRE-OPERATIVE DIAGNOSIS: Right comminuted displaced intertrochanteric hip fracture  POST-OPERATIVE DIAGNOSIS:  Same  PROCEDURE: Intramedullary fixation for right displaced intertrochanteric hip fracture  SURGEON:  Thornton Park, MD  ANESTHESIA:   Spinal  EBL:  200 cc  IMPLANT:  ZIMMER BIOMET AFFIXUS NAIL 318m x 153mwith a 1154mag screw and distal interlocking screws 53m60mnd 48 mm in length.  PREOPERATIVE INDICATIONS:  Pamela Paul  89 y48. female with a diagnosis of a displaced, comminuted right intertrochanteric hip fracture status post fall.  Patient was recommended to undergo intramedullary fixation for her fracture.  She is ambulatory at baseline with a walker.    The risks, benefits and alternatives were discussed with the patient and their family.  The risks include but are not limited to infection, bleeding requiring blood transfusion, nerve or blood vessel injury, malunion, nonunion, hardware prominence, hardware failure, leg length discrepancy or change in lower extremity rotation and need for further surgery including hardware removal with conversion to a total hip arthroplasty. Medical risks include but are not limited to DVT and pulmonary embolism, myocardial infarction, stroke, pneumonia, respiratory failure and death. The patient and her family understood these risks and wished to proceed with surgery.  OPERATIVE PROCEDURE:  The patient was brought to the operating room and placed in the supine position on the fracture table. The patient received spinal anesthesia.  A closed reduction was performed under C-arm guidance.  The fracture reduction was confirmed on both AP and lateral views. After adequate reduction was achieved, a time out was performed to verify the patient's name, date of birth, medical record number, correct site of surgery correct procedure to be performed. The timeout was also used to verify the  patient received antibiotics and all appropriate instruments, implants and radiographic studies were available in the room. Once all in attendance were in agreement, the case began. The patient was prepped and draped in a sterile fashion. She received preoperative antibiotics with 2 grams of Ancef IV.  An incision was made proximal to the greater trochanter in line with the femur. A guidewire was placed over the tip of the greater trochanter and advanced by drill into the proximal femur to the level of the lesser trochanter.  Confirmation of the drill pin position was made on AP and lateral C-arm images.  The threaded guidepin was then overdrilled with the proximal femoral entry reamer.  A ball-tipped guidewire was then advanced down the intramedullary canal, across the fracture, and down the femoral shaft to the knee.  The ball tip guidewire's position was confirmed at both the knee and hip via C-arm imaging. A depth gauge was used to measure the length of the long nail to be used. It was measured to be 380 mm. The actual nail was then inserted into the proximal femur, across the fracture site and down the femoral shaft. Its position was confirmed on AP and lateral C-arm images.  The ball tip guidewire was removed.  Once the nail was completely seated, the drill guide for the lag screw was placed through the guide arm for the Affixus nail. A guidepin was then placed through this drill guide and advanced through the lateral cortex of the femur, across the fracture site and into the femoral head achieving a tip apex distance of less than 25 mm. The length of the drill pin was measured to be 115 mm, and then the drill for  the lag screw was advanced through the lateral cortex, across the fracture site and up into the femoral head to the depth of 115 mm.  The lag screw was then advanced by hand into position across the fracture site into the femoral head. Its final position was confirmed on AP and lateral C-arm  images. Compression was applied as traction was carefully released. The set screw in the top of the intramedullary rod was tightened by hand using a screwdriver. It was backed off a quarter turn to allow for compression at the fracture site.  The attention was then turned to placement of the distal interlocking screws. A perfect circle technique was used. 2 small stab incisions were made over the distal interlocking screw holes.  A free hand technique was used to drill both distal interlocking screws. The depth of the screw holes was measured with a depth gauge. The 6m and 437mscrews were then advanced into position and tightened by hand. Final C-arm images of the entire intramedullary construct were taken in both the AP and lateral planes.   The wounds were irrigated copiously and closed with 0 Vicryl for closure of the deep fascia and 2-0 Vicryl for subcutaneous closure. The skin was approximated with staples. A dry sterile dressing was applied. I was scrubbed and present the entire case and all sharp, sponge and instrument counts were correct at the conclusion of the case. Patient was transferred to a hospital bed and brought to PACU in stable condition.     KeTimoteo GaulMD

## 2022-09-21 NOTE — Hospital Course (Addendum)
Pamela Paul, a 88 y/o with hypothyroidism, h/o breast cancer, CKD, had a mechanical fall at home with immediate pain and inability to bear weight. She presents to [facility] for evaluation 09/20/22.   10/18-10/19: intertrochanteric fracture of right femur, orthopedics consulted, intramedullary right nail femur done 09/21/22. Plan SNF placement.  Overnight 10/19 respiratory distress after receiving fentanyl for treatment of her hip pain, requiring narcan x2 doses which resulted in improvement. CTA r/o PE. Troponin not elevated. Remains on telemetry.  10/20: symptomatic postop anemia, received 1 unit PRBC. Await SNF placement. Lovenox while at skilled nursing facility.  Patient will follow-up with Dr Cindi Carbon in the office in 10 to 14 days for wound check and staple removal. 10/21: sodium trending down --> hypotonic, osm and lytes c/w hypovolemia, started IV fluids 1L. Retaining urine requiring in and out cath. Minimal mobility, pt encouraged OOB to commode.   Consultants:  Orthopedics   Procedures: Intramedullary nail right femur 09/21/2022   Wt Readings from Last 3 Encounters:  09/21/22 83.9 kg  07/28/22 82.6 kg  04/27/22 85.2 kg      ASSESSMENT & PLAN:   Principal Problem:   Intertrochanteric fracture of right femur (HCC) Active Problems:   Hypothyroidism   Hypertension   Hyponatremia   Acute postoperative anemia due to expected blood loss   Intertrochanteric fracture of right femur (HCC) Low/moderate risk for low risk surgery Orthopedics service following   Hypothyroidism TSH 08/17/21 with good control. TSH pending Continue present dose of synthroid - adjust dose as needed based on lab  Postoperative anemia Hgb 7.2 --(S/p 1 unit PRBC per surgery team)--> 8.5 Monitor CBC  Hypotonic Hypovolemic Hyponatremia  1L NS Recheck labs in AM  Hypertension BP controlled / low  Held home medications  CKD 3a/b Trend BMP   DVT prophylaxis: lovenox - low threshold to hold this  if bleeding  Pertinent IV fluids/nutrition: cleared for diet by SLP. Getting NS for hyponatremia.  Central lines / invasive devices: none  Code Status: FULL CODE Family Communication: family at bedside on rounds   Disposition: inpatient  TOC needs: need SNF for rehab Barriers to discharge / significant pending items: SNF auth pending for rehab post op, hyponatremia evaluation

## 2022-09-21 NOTE — Progress Notes (Incomplete)
       CROSS COVER NOTE  NAME: Pamela Paul MRN: [id number] DOB : [birth year]    Date of Service   DATE10/19/23  HPI/Events of Note   Patient is s/p intramedullary fixation for right displaced intertrochanteric hip fracture today who developed respiratory distress after receiving fentanyl for treatment of her hip pain PMH includes but not limited to mitral valve cardiac disease, breast cancer with lumpectomy and node excision 1 year ago (no chemo) Patient complained of chest burning never experienced before  Assessment and  Interventions   Assessment: Neuro - responding slowly, pinpoint pupils Resp - deep sonorous respiration CV - digits cool, pale  CBG 135 Narcan - 0.4 mg initial improvement in neuro and resp function but then needed repeat dose Faste deep respirations remained despite patient stating she felt better. Neuro function with great improvement; + belching Plan: Duoneb given for wheezing Maalox + simethicone BMP, Ddimer, vbg, EKG, troponin No venipunture or BP right arm  VBG with respiratory alkalosis -  7.46 -pCO2 32, HCO3 22.8 Ddimer elevated 3.76 and Wells score for PE 7, high risk EKG with too much artifact to interpret. Tele placed, ST with prolonged QT, prior EKG SR 64 with 1st degree AVB and PACs  CT angio chest r/o PE IMPRESSION: 1. No acute intrathoracic pathology. No CT evidence of pulmonary artery embolus. 2. A 5 mm subpleural nodule in the superior segment of the left lower lobe. No follow-up needed if patient is low-risk.This recommendation follows the consensus statement: Guidelines for Management of Incidental Pulmonary Nodules Detected on CT Images: From the Fleischner Society 2017; Radiology 2017; 284:228-243. 3. Age indeterminate, likely chronic compression fracture of superior endplate of T3 with approximately 30% loss of vertebral body height and anterior wedging. Correlation with clinical exam and point tenderness recommended. 4. Fatty  liver. 5.  Aortic Atherosclerosis (ICD10-I70.0).        Kathlene Cote NP [facility]

## 2022-09-21 NOTE — H&P (Signed)
History and Physical    Pamela Paul PJA:[id number] DOB: 01/29/1926 DOA: 09/20/2022  DOS: the patient was seen and examined on 09/20/2022  PCP: Dewayne Shorter, MD   Patient coming from: Home  I have personally briefly reviewed patient's old medical records in [facility]  Pamela Paul, a 88 y/o with hypothyroidism, h/o breast cancer, CKD, had a mechanical fall at home with immediate pain and inability to bear weight. She presents to [facility] for evaluation.   ED Course: Afebrile, BP 143/80 HR 87  RR 16. Lab with glucose 117, CR 1.67 - chronic. Hgb 11.8, WBC 8.5. X-ray with acute comminuted displaced intertrochanteric fx. Ortho consulted and will see in AM. TRH called to admit.   Review of Systems:  Review of Systems  Constitutional:  Negative for chills, fever, malaise/fatigue and weight loss.  HENT: Negative.    Eyes: Negative.   Respiratory: Negative.    Cardiovascular: Negative.   Gastrointestinal: Negative.   Genitourinary: Negative.   Musculoskeletal:  Positive for joint pain.       Pain right hip and leg, worse with movement  Skin: Negative.   Endo/Heme/Allergies: Negative.   Psychiatric/Behavioral: Negative.      Past Medical History:  Diagnosis Date   Breast cancer ([place])    CKD (chronic kidney disease) stage 3, GFR 30-59 ml/min (HCC)    Heart disease    mitral valvular   Hyperlipidemia    Hyperparathyroidism, secondary ([place])    Hypertension    Hypothyroidism    Spinal stenosis of lumbar region 2006   s/p surgery    Past Surgical History:  Procedure Laterality Date   ABDOMINAL HYSTERECTOMY     bilateral cataract surgery     BREAST LUMPECTOMY Right 07/22/2021   CHOLECYSTECTOMY  12/04/1978   FRACTURE SURGERY Left 12/29/2018   FEMUR   JOINT REPLACEMENT     Bilateral knee   LAPAROSCOPIC SIGMOID COLECTOMY  12/04/2009   secondary to benign mass   MELANOMA EXCISION WITH SENTINEL LYMPH NODE BIOPSY Right 07/22/2021   Procedure: MELANOMA EXCISION WITH  SENTINEL LYMPH NODE BIOPSY;  Surgeon: Herbert Pun, MD;  Location: [facility] ORS;  Service: General;  Laterality: Right;   PART [place] Right 07/22/2021   Procedure: PART MASTECTOMY,RADIO FREQUENCY LOCALIZER,AXILLARY SENTINEL NODE BIOPSY;  Surgeon: Herbert Pun, MD;  Location: [facility] ORS;  Service: General;  Laterality: Right;   REPLACEMENT TOTAL KNEE BILATERAL     SMALL INTESTINE SURGERY  12/04/2008   for SBO   SPINE SURGERY  12/04/2004   [facility], [facility]   tendon repair     achille heall, left    Soc Hx - widowed after 47 years of marriage, 5 children. She lives alone - very independent. Feels younger that her age   reports that she quit smoking about 61 years ago. Her smoking use included cigarettes. She has never used smokeless tobacco. She reports that she does not currently use alcohol. She reports that she does not use drugs.  Allergies  Allergen Reactions   Bactrim [Sulfamethoxazole-Trimethoprim] Nausea And Vomiting    Lethargic and weakness    Family History  Problem Relation Age of Onset   Arthritis Mother    Stroke Mother    Hyperlipidemia Father    Cancer Father    Heart disease Paternal Aunt     Prior to Admission medications   Medication Sig Start Date End Date Taking? Authorizing Provider  levothyroxine (SYNTHROID) 50 MCG tablet TAKE 1 TABLET EVERY DAY ON EMPTY  STOMACHWITH A GLASS OF WATER AT LEAST 30-60 MINBEFORE BREAKFAST 11/02/21  Yes Venia Carbon, MD  Multiple Vitamins-Minerals (PRESERVISION AREDS PO) Take 1 capsule by mouth 2 (two) times daily.   Yes [provider]  telmisartan (MICARDIS) 20 MG tablet TAKE 1 TABLET BY MOUTH DAILY 05/30/22  Yes Venia Carbon, MD  Calcium Carbonate-Vitamin D3 600-400 MG-UNIT TABS Take 1 tablet by mouth 2 (two) times daily.    [provider]  Magnesium Oxide 250 MG TABS Take 1 tablet by mouth every other day. At bedtime    [provider]    Physical Exam: Vitals:   09/20/22 1805 09/20/22 1852 09/20/22 2258  BP:  (!) 187/95 (!) 143/80  Pulse:  76 87  Resp:  20 16  Temp:  97.8 F (36.6 C) 97.8 F (36.6 C)  TempSrc:  Oral Oral  SpO2:  97% 96%  Weight: 83.9 kg      Physical Exam Constitutional:      General: She is not in acute distress.    Appearance: Normal appearance. She is normal weight. She is not ill-appearing or toxic-appearing.  HENT:     Head: Normocephalic and atraumatic.     Ears:     Comments: Hard of hearing Eyes:     Extraocular Movements: Extraocular movements intact.     Conjunctiva/sclera: Conjunctivae normal.     Pupils: Pupils are equal, round, and reactive to light.  Cardiovascular:     Rate and Rhythm: Normal rate and regular rhythm.     Pulses: Normal pulses.     Heart sounds: Normal heart sounds.  Pulmonary:     Effort: Pulmonary effort is normal.     Breath sounds: Normal breath sounds. No wheezing.  Abdominal:     General: Bowel sounds are normal.     Palpations: Abdomen is soft.     Tenderness: There is no abdominal tenderness. There is no guarding.  Musculoskeletal:     Cervical back: Normal range of motion and neck supple.     Comments: Mild rotation right leg. Very tender with any movement right leg  Lymphadenopathy:     Cervical: No cervical adenopathy.  Skin:    General: Skin is warm and dry.  Neurological:     General: No focal deficit present.     Mental Status: She is alert and oriented to person, place, and time.     Cranial Nerves: No cranial nerve deficit.     Motor: No weakness.  Psychiatric:        Mood and Affect: Mood normal.        Behavior: Behavior normal.      Labs on Admission: I have personally reviewed following labs and imaging studies  CBC: Recent Labs  Lab 09/20/22 1807  WBC 8.5  NEUTROABS 5.6  HGB 11.8*  HCT 35.4*  MCV 95.7  PLT 009   Basic Metabolic Panel: Recent Labs  Lab 09/20/22 1807  NA 133*  K 3.8  CL 98   CO2 26  GLUCOSE 117*  BUN 22  CREATININE 1.67*  CALCIUM 9.3   GFR: CrCl cannot be calculated (Unknown ideal weight.). Liver Function Tests: No results for input(s): "AST", "ALT", "ALKPHOS", "BILITOT", "PROT", "ALBUMIN" in the last 168 hours. No results for input(s): "LIPASE", "AMYLASE" in the last 168 hours. No results for input(s): "AMMONIA" in the last 168 hours. Coagulation Profile: No results for input(s): "INR", "PROTIME" in the last 168 hours. Cardiac Enzymes: No results for input(s): "CKTOTAL", "  CKMB", "CKMBINDEX", "TROPONINI" in the last 168 hours. BNP (last 3 results) No results for input(s): "PROBNP" in the last 8760 hours. HbA1C: No results for input(s): "HGBA1C" in the last 72 hours. CBG: No results for input(s): "GLUCAP" in the last 168 hours. Lipid Profile: No results for input(s): "CHOL", "HDL", "LDLCALC", "TRIG", "CHOLHDL", "LDLDIRECT" in the last 72 hours. Thyroid Function Tests: No results for input(s): "TSH", "T4TOTAL", "FREET4", "T3FREE", "THYROIDAB" in the last 72 hours. Anemia Panel: No results for input(s): "VITAMINB12", "FOLATE", "FERRITIN", "TIBC", "IRON", "RETICCTPCT" in the last 72 hours. Urine analysis:    Component Value Date/Time   BILIRUBINUR small 12/28/2014 0956   PROTEINUR 30 12/28/2014 0956   UROBILINOGEN 0.2 12/28/2014 0956   NITRITE neg 12/28/2014 0956   LEUKOCYTESUR large (3+) 12/28/2014 0956    Radiological Exams on Admission: I have personally reviewed images DG Chest 1 View  Result Date: 09/20/2022 CLINICAL DATA:  Fall EXAM: CHEST  1 VIEW COMPARISON:  09/27/2020 FINDINGS: Low lung volumes. No acute airspace disease or effusion. Upper normal cardiomediastinal silhouette augmented by low lung volume. No pneumothorax IMPRESSION: No active disease.  Low lung volume Electronically Signed   By: Donavan Foil M.D.   On: 09/20/2022 18:42   DG Hip Unilat W or Wo Pelvis 2-3 Views Right  Result Date: 09/20/2022 CLINICAL DATA:  Hip pain  with fall EXAM: DG HIP (WITH OR WITHOUT PELVIS) 2-3V RIGHT COMPARISON:  None Available. FINDINGS: Hardware in the lower lumbar spine. Pubic symphysis and rami appear intact. Multiple clips in the pelvis. Acute comminuted intertrochanteric fracture with displacement. No femoral head dislocation IMPRESSION: Acute comminuted and displaced right intertrochanteric fracture Electronically Signed   By: Donavan Foil M.D.   On: 09/20/2022 18:41    EKG: I have personally reviewed EKG: no EKG on chart  Assessment/Plan Active Problems:   Hypothyroidism   Hypertension   Intertrochanteric fracture of right femur (HCC)    Assessment and Plan: Intertrochanteric fracture of right femur [facility]) Patient had a non-syncopal fall. She was unable to bear weight and had pain. ED eval revealed right intertrochanteric fracture comminuted and displaced. EDP contacted ortho on-call  Plan TRH to admit  NPO /p MN    Hypertension BP adequately controlled  Plan Continue home medications  Hypothyroidism Last TSH 08/17/21 with good control.  Plan  TSH  Continue present dose of synthroid - adjust dose as needed based on lab       DVT prophylaxis: SQ Heparin Code Status: Full Code Family Communication: defer calling family until later in the day  Disposition Plan: TBD  Consults called: ortho - EDP called ortho  Admission status: Inpatient, Med-Surg   Adella Hare, MD [facility] 09/21/2022, 12:48 AM

## 2022-09-21 NOTE — Plan of Care (Signed)

## 2022-09-21 NOTE — Progress Notes (Signed)
  Brief Progress Note (See full H&P from earlier today)   Pamela Paul, a 88 y/o with hypothyroidism, h/o breast cancer, CKD, had a mechanical fall at home with immediate pain and inability to bear weight. She presents to [facility] for evaluation 09/20/22.  Noted to have intertrochanteric fracture of right femur, orthopedics consulted, intramedullary right nail femur planned today 09/21/22.   Consultants:  Orthopedics   Procedures: [Planned: Intramedullary nail right femur 09/21/2022]      ASSESSMENT & PLAN:   Principal Problem:   Intertrochanteric fracture of right femur (HCC) Active Problems:   Hypothyroidism   Hypertension   Intertrochanteric fracture of right femur (HCC) Low/moderate risk for low risk surgery Orthopedics service following   Hypothyroidism TSH 08/17/21 with good control. TSH pending Continue present dose of synthroid - adjust dose as needed based on lab  Hypertension BP controlled Continue home medications  CKD 3a/b Trend BMP   DVT prophylaxis: heparin Pertinent IV fluids/nutrition: npo pending surgery  Central lines / invasive devices: none  Code Status: FULL CODE Family Communication: family at bedside in ED   Disposition: inpatient  TOC needs: may need SNF for rehab Barriers to discharge / significant pending items: pending surgery for hip repair, and PT/OT eval after that. Anticipate few more days inpatient        Subjective: Patient reports she is uncomfortable but overall pain is controlled.  No CP/SOB.  Family has no concerns   Objective: Relevant new results:  No concerns on EKG Physical Exam:  BP (!) 129/57   Pulse 76   Temp 98 F (36.7 C) (Oral)   Resp 16   Ht '5\' 5"'$  (1.651 m)   Wt 83.9 kg   SpO2 96%   BMI 30.78 kg/m  Constitutional:  General Appearance: alert, well-developed, well-nourished, NAD Respiratory: Normal respiratory effort Breath sounds normal, no wheeze/rhonchi/rales Cardiovascular: S1/S2 normal, no  murmur/rub/gallop auscultated No lower extremity edema Gastrointestinal: Nontender, no masses Musculoskeletal:  No clubbing/cyanosis of digits Neurological: No cranial nerve deficit on limited exam Psychiatric: Normal judgment/insight Normal mood and affect

## 2022-09-21 NOTE — Subjective & Objective (Signed)
Pamela Paul, a 88 y/o with hypothyroidism, h/o breast cancer, CKD, had a mechanical fall at home with immediate pain and inability to bear weight. She presents to [facility] for evaluation.

## 2022-09-21 NOTE — Anesthesia Procedure Notes (Signed)
Procedure Name: General with mask airway Date/Time: 09/21/2022 1:22 PM  Performed by: Lerry Liner, CRNAPre-anesthesia Checklist: Patient identified, Emergency Drugs available, Suction available and Patient being monitored Patient Re-evaluated:Patient Re-evaluated prior to induction Oxygen Delivery Method: Simple face mask Induction Type: IV induction Placement Confirmation: positive ETCO2, CO2 detector and breath sounds checked- equal and bilateral Dental Injury: Teeth and Oropharynx as per pre-operative assessment

## 2022-09-21 NOTE — Progress Notes (Signed)
Rn called to bedside at 1908 at request of another nurse w/ patient reporting shortness of breath. Patient assessed to be gasping for air, heart rate tachycardic, no fever, and SpO2 sats dropped to to 77%. Non-rebreather applied at 10L and patient instructed on proper breathing techniques however, at this point, patient has become so anxious that she is unable to recover her SpO2 sats to WNL. Vitals obtained. IV fluids and IV ABX stopped at this time. Pt continued to demonstrate struggled breathing, gasping and pawing at her face and the face mask. This Rn called rapid response to get providers at bedside at 1900. At [place] patient was administered PRN Fentanyl for pain control and this Rn was concerned that though patient had taken this medication the day before, she was experiencing a second-dose effect allergic reaction. Dr Randol Kern gave orders to administer 0.4/mg/mL narcan IV, given my RR Rn,

## 2022-09-21 NOTE — Progress Notes (Signed)
TOC acknowledges consult for SNF placement, pending PT/OT evals for recommendations at this time.  [place], [place]

## 2022-09-22 ENCOUNTER — Encounter: Payer: Self-pay | Admitting: Orthopedic Surgery

## 2022-09-22 DIAGNOSIS — S72144A Nondisplaced intertrochanteric fracture of right femur, initial encounter for closed fracture: Secondary | ICD-10-CM | POA: Diagnosis not present

## 2022-09-22 DIAGNOSIS — I10 Essential (primary) hypertension: Secondary | ICD-10-CM | POA: Diagnosis not present

## 2022-09-22 DIAGNOSIS — E039 Hypothyroidism, unspecified: Secondary | ICD-10-CM | POA: Diagnosis not present

## 2022-09-22 DIAGNOSIS — D62 Acute posthemorrhagic anemia: Secondary | ICD-10-CM

## 2022-09-22 LAB — CBC
HCT: 21 % — ABNORMAL LOW (ref 36.0–46.0)
Hemoglobin: 7.2 g/dL — ABNORMAL LOW (ref 12.0–15.0)
MCH: 32.1 pg (ref 26.0–34.0)
MCHC: 34.3 g/dL (ref 30.0–36.0)
MCV: 93.8 fL (ref 80.0–100.0)
Platelets: 166 10*3/uL (ref 150–400)
RBC: 2.24 MIL/uL — ABNORMAL LOW (ref 3.87–5.11)
RDW: 11.9 % (ref 11.5–15.5)
WBC: 9.8 10*3/uL (ref 4.0–10.5)
nRBC: 0 % (ref 0.0–0.2)

## 2022-09-22 LAB — BASIC METABOLIC PANEL
Anion gap: 7 (ref 5–15)
BUN: 21 mg/dL (ref 8–23)
CO2: 25 mmol/L (ref 22–32)
Calcium: 7.4 mg/dL — ABNORMAL LOW (ref 8.9–10.3)
Chloride: 96 mmol/L — ABNORMAL LOW (ref 98–111)
Creatinine, Ser: 1.44 mg/dL — ABNORMAL HIGH (ref 0.44–1.00)
GFR, Estimated: 34 mL/min — ABNORMAL LOW (ref >60)
Glucose, Bld: 114 mg/dL — ABNORMAL HIGH (ref 70–99)
Potassium: 4.2 mmol/L (ref 3.5–5.1)
Sodium: 128 mmol/L — ABNORMAL LOW (ref 135–145)

## 2022-09-22 LAB — ABO/RH: ABO/RH(D): O POS

## 2022-09-22 LAB — PHOSPHORUS: Phosphorus: 2.9 mg/dL (ref 2.5–4.6)

## 2022-09-22 LAB — MAGNESIUM: Magnesium: 1.6 mg/dL — ABNORMAL LOW (ref 1.7–2.4)

## 2022-09-22 LAB — OSMOLALITY: Osmolality: 270 mOsm/kg — ABNORMAL LOW (ref 275–295)

## 2022-09-22 LAB — PREPARE RBC (CROSSMATCH)

## 2022-09-22 MED ORDER — MAGNESIUM SULFATE 4 GM/100ML IV SOLN
4.0000 g | Freq: Once | INTRAVENOUS | Status: AC
  Administered 2022-09-22: 4 g via INTRAVENOUS
  Filled 2022-09-22: qty 100

## 2022-09-22 MED ORDER — PANTOPRAZOLE SODIUM 40 MG IV SOLR
40.0000 mg | INTRAVENOUS | Status: DC
  Administered 2022-09-22 – 2022-09-25 (×4): 40 mg via INTRAVENOUS
  Filled 2022-09-22 (×4): qty 10

## 2022-09-22 MED ORDER — SODIUM CHLORIDE 0.9% IV SOLUTION: Freq: Once | INTRAVENOUS | Status: AC

## 2022-09-22 NOTE — TOC Progression Note (Signed)
Transition of Care [facility]) - Progression Note    Patient Details  Name: Pamela Paul MRN: [id number] Date of Birth: 1926-05-05  Transition of Care [facility]) CM/SW [place], RN Phone Number: 09/22/2022, 3:41 PM  Clinical Narrative:    Met with the patient and her family in the room, she is a resident at [facility], I confirmed with Seth Bake that she will go to [place] is pending   Expected Discharge Plan: [place] Barriers to Discharge: Continued Medical Work up  Expected Discharge Plan and Services Expected Discharge Plan: [place] arrangements for the past 2 months: Single Family Home                                       Social Determinants of Health (SDOH) Interventions    Readmission Risk Interventions     No data to display

## 2022-09-22 NOTE — Progress Notes (Signed)
Initial Nutrition Assessment  DOCUMENTATION CODES:   Obesity unspecified  INTERVENTION:   -Ensure Enlive po BID, each supplement provides 350 kcal and 20 grams of protein -MVI with minerals daily  NUTRITION DIAGNOSIS:   Increased nutrient needs related to post-op healing as evidenced by estimated needs.  GOAL:   Patient will meet greater than or equal to 90% of their needs  MONITOR:   PO intake, Supplement acceptance  REASON FOR ASSESSMENT:   Consult Assessment of nutrition requirement/status, Hip fracture protocol  ASSESSMENT:   Pt with hypothyroidism, h/o breast cancer, CKD, had a mechanical fall at home with immediate pain and inability to bear weight.  Pt admitted with rt femur fracture.   Reviewed I/O's: +856 ml x 24 hours  10/19- s/p PROCEDURE: Intramedullary fixation for right displaced intertrochanteric hip fracture 10/20- s/p BSE- recommend continuing regular diet  UOP: 500 ml x 24 hours   Spoke with pt and son at bedside. Pt shares she does not have much of an appetite today and consumed a few spoonfuls of yogurt for breakfast. Noted meal completions 50% on a regular diet.   PTA, pt reports she consumes 3 meals per day (8,12, and 5) at her assisted living facility. Pt typically consumes all of her meals and enjoys the food at ALF.   Pt denies any weight loss. Reviewed wt hx; no wt loss noted x 8 months.   Discussed importance of good meal and supplement intake to promote healing.   Medications reviewed and include colace and senokot.   Lab Results  Component Value Date   HGBA1C 5.8 03/08/2020   PTA DM medications are none.   Labs reviewed: Na: 128, Mg: 1.6, CBGS: 135 (inpatient orders for glycemic control are none).    NUTRITION - FOCUSED PHYSICAL EXAM:  Flowsheet Row Most Recent Value  Orbital Region No depletion  Upper Arm Region Mild depletion  Thoracic and Lumbar Region No depletion  Buccal Region No depletion  Temple Region No depletion   Clavicle Bone Region No depletion  Clavicle and Acromion Bone Region No depletion  Scapular Bone Region No depletion  Dorsal Hand No depletion  Patellar Region No depletion  Anterior Thigh Region No depletion  Posterior Calf Region No depletion  Edema (RD Assessment) None  Hair Reviewed  Eyes Reviewed  Mouth Reviewed  Skin Reviewed  Nails Reviewed       Diet Order:   Diet Order             Diet regular Room service appropriate? Yes; Fluid consistency: Thin  Diet effective now                   EDUCATION NEEDS:   Education needs have been addressed  Skin:  Skin Assessment: Skin Integrity Issues: Skin Integrity Issues:: Incisions Incisions: closed rt hip  Last BM:  09/20/22  Height:   Ht Readings from Last 1 Encounters:  09/21/22 '5\' 5"'$  (1.651 m)    Weight:   Wt Readings from Last 1 Encounters:  09/21/22 83.9 kg    Ideal Body Weight:  56.8 kg  BMI:  Body mass index is 30.78 kg/m.  Estimated Nutritional Needs:   Kcal:  1500-1700  Protein:  75-90 grams  Fluid:  > 1.5 L    Loistine Chance, RD, LDN, [place] Registered Dietitian II Certified Diabetes Care and Education Specialist Please refer to [facility] for RD and/or RD on-call/weekend/after hours pager

## 2022-09-22 NOTE — Anesthesia Postprocedure Evaluation (Signed)
Anesthesia Post Note  Patient: Pamela Paul  Procedure(s) Performed: INTRAMEDULLARY (IM) NAIL INTERTROCHANTERIC (Right)  Patient location during evaluation: Nursing Unit Anesthesia Type: Spinal Level of consciousness: awake and alert Pain management: pain level controlled Vital Signs Assessment: post-procedure vital signs reviewed and stable Respiratory status: spontaneous breathing and nonlabored ventilation Cardiovascular status: blood pressure returned to baseline Postop Assessment: no headache, no backache and no apparent nausea or vomiting Anesthetic complications: no   No notable events documented.   Last Vitals:  Vitals:   09/22/22 0505 09/22/22 0809  BP: (!) 117/50 (!) 112/47  Pulse: 75 70  Resp: 18 17  Temp: 36.7 C 36.9 C  SpO2: 96% 97%    Last Pain:  Vitals:   09/22/22 0040  TempSrc:   PainSc: 0-No pain                 [name]

## 2022-09-22 NOTE — Evaluation (Signed)
Physical Therapy Evaluation Patient Details Name: Pamela Paul MRN: [id number] DOB: October 05, 1926 Today's Date: 09/22/2022  History of Present Illness  Pt is a 88 y/o F admitted on 09/20/22 after presenting 2/2 falling at home & inability to bear weight. Pt found to have R femur intertrochanteric fx. Pt underwent R IM nailing on 09/21/22.  PMH: hypothyroid, h/o breast CA, CKD  Clinical Impression  Pt seen for PT evaluation with pt & son Shanon Brow) received in room & pt agreeable to tx. Prior to admission pt was mod I with rollator living in [facility] apartment. Pt engages in RLE strengthening exercises with cuing for technique & assistance PRN. Pt requires max assist for supine>sit, +2 for sit>supine 2/2 not feeling well. Pt c/o dizziness that initially improves but then worsens while sitting EOB. BP upon return to bed in LUE: 131/60 mmHg MAP 80, HR 92 bpm. Pt would benefit from STR upon d/c to maximize independence with functional mobility & reduce fall risk prior to return home. Will continue to follow pt acutely to address strengthening, activity tolerance, bed mobility, transfers, & gait.    Recommendations for follow up therapy are one component of a multi-disciplinary discharge planning process, led by the attending physician.  Recommendations may be updated based on patient status, additional functional criteria and insurance authorization.  Follow Up Recommendations Skilled nursing-short term rehab (<3 hours/day) Can patient physically be transported by private vehicle: No    Assistance Recommended at Discharge Frequent or constant Supervision/Assistance  Patient can return home with the following  Two people to help with walking and/or transfers;Two people to help with bathing/dressing/bathroom;Assist for transportation;Assistance with cooking/housework    Equipment Recommendations None recommended by PT  Recommendations for Other Services       Functional Status Assessment Patient  has had a recent decline in their functional status and demonstrates the ability to make significant improvements in function in a reasonable and predictable amount of time.     Precautions / Restrictions Precautions Precautions: Fall Restrictions Weight Bearing Restrictions: Yes RLE Weight Bearing: Weight bearing as tolerated      Mobility  Bed Mobility Overal bed mobility: Needs Assistance Bed Mobility: Supine to Sit, Sit to Supine     Supine to sit: Max assist, HOB elevated (cuing for each step, max assist to move BLE to EOB, upright trunk & scoot to sitting EOB) Sit to supine: Max assist, +2 for physical assistance        Transfers                        Ambulation/Gait                  Stairs            Wheelchair Mobility    Modified Rankin (Stroke Patients Only)       Balance Overall balance assessment: Needs assistance Sitting-balance support: Feet supported, Bilateral upper extremity supported Sitting balance-Leahy Scale: Poor Sitting balance - Comments: min assist astatic sitting EOB with BUE support                                     Pertinent Vitals/Pain Pain Assessment Pain Assessment: Faces Faces Pain Scale: Hurts even more Pain Location: R thigh Pain Descriptors / Indicators: Discomfort Pain Intervention(s): Premedicated before session, Monitored during session, Repositioned    Home Living Family/patient expects to be discharged  to:: Assisted living                 Home Equipment: Rollator (4 wheels) Additional Comments: 2nd floor apartment with elevator access at [facility]    Prior Function Prior Level of Function : Independent/Modified Independent             Mobility Comments: pt reports mod I with rollator, denies any other fall hx       Hand Dominance        Extremity/Trunk Assessment   Upper Extremity Assessment Upper Extremity Assessment: Overall WFL for tasks  assessed    Lower Extremity Assessment Lower Extremity Assessment: RLE deficits/detail RLE Deficits / Details: 2/5 R knee extension in sitting, sensation to light touch in sitting       Communication   Communication: HOH (hearing aides donned during session)  Cognition Arousal/Alertness: Awake/alert Behavior During Therapy: WFL for tasks assessed/performed Overall Cognitive Status: Within Functional Limits for tasks assessed                                 General Comments: Pt is AxOx4 with extra time to recall current month & year. Follows commands with extra time.        General Comments      Exercises General Exercises - Lower Extremity Ankle Circles/Pumps: AROM, Both, 10 reps, Supine Quad Sets: AROM, Strengthening, Right, 10 reps, Supine Short Arc Quad: AAROM, Supine, Strengthening, Right, 10 reps Long Arc Quad: AROM, Right, Strengthening, 10 reps, Seated Heel Slides: AAROM, Supine, Strengthening, Right, 10 reps Hip ABduction/ADduction: AAROM, Supine, Strengthening, Right, 10 reps (hip abduction slides x 10)   Assessment/Plan    PT Assessment Patient needs continued PT services  PT Problem List Decreased strength;Pain;Decreased range of motion;Decreased activity tolerance;Decreased balance;Decreased mobility;Decreased safety awareness;Decreased knowledge of use of DME;Decreased knowledge of precautions       PT Treatment Interventions DME instruction;Therapeutic exercise;Gait training;Balance training;Neuromuscular re-education;Modalities;Manual techniques;Functional mobility training;Therapeutic activities;Patient/family education    PT Goals (Current goals can be found in the Care Plan section)  Acute Rehab PT Goals Patient Stated Goal: get better, go to rehab PT Goal Formulation: With patient/family Time For Goal Achievement: 10/06/22 Potential to Achieve Goals: Good    Frequency BID     Co-evaluation               AM-PAC PT "6 Clicks"  Mobility  Outcome Measure Help needed turning from your back to your side while in a flat bed without using bedrails?: A Lot Help needed moving from lying on your back to sitting on the side of a flat bed without using bedrails?: Total Help needed moving to and from a bed to a chair (including a wheelchair)?: Total Help needed standing up from a chair using your arms (e.g., wheelchair or bedside chair)?: Total Help needed to walk in hospital room?: Total Help needed climbing 3-5 steps with a railing? : Total 6 Click Score: 7    End of Session   Activity Tolerance:  (limited by dizziness) Patient left: in bed;with call bell/phone within reach;with bed alarm set;with family/visitor present;with SCD's reapplied (SLP in room) Nurse Communication: Mobility status PT Visit Diagnosis: Muscle weakness (generalized) (M62.81);Difficulty in walking, not elsewhere classified (R26.2);Unsteadiness on feet (R26.81);Pain Pain - Right/Left: Right Pain - part of body: Leg    Time: 0942-1000 PT Time Calculation (min) (ACUTE ONLY): 18 min   Charges:   PT Evaluation $PT  Eval Moderate Complexity: 1 Mod PT Treatments $Therapeutic Exercise: 8-22 mins        Lavone Nian, PT, DPT 09/22/22, 10:12 AM   Waunita Schooner 09/22/2022, 10:11 AM

## 2022-09-22 NOTE — Progress Notes (Signed)
Subjective:  POD #1 s/p intramedullary fixation for right intertrochanteric hip fracture.   Patient reports right hip pain as mild.  Patient had a rapid response event overnight for shortness of breath.  CT angio showed no pulmonary embolus.  Opponent was not elevated.  Today the patient states she is having no chest pain, shortness of breath or abdominal pain.  She denies any nausea.  Patient was able to get up with physical therapy.  Her hemoglobin was found to be 7.2 and was given a unit of PRBCs today.  Objective:   VITALS:   Vitals:   09/22/22 1005 09/22/22 1016 09/22/22 1109 09/22/22 1315  BP: (!) 100/54 (!) 97/46 (!) 90/58 (!) 101/53  Pulse: 75 74  81  Resp: '18 18  16  '$ Temp: (!) 97.5 F (36.4 C) 97.7 F (36.5 C)  98 F (36.7 C)  TempSrc: Oral Oral  Oral  SpO2: 97% 95%    Weight:      Height:        PHYSICAL EXAM: Right lower extremity Neurovascular intact Sensation intact distally Intact pulses distally Dorsiflexion/Plantar flexion intact Incision: dressing C/D/I No cellulitis present Compartment soft  LABS  Results for orders placed or performed during the hospital encounter of 09/20/22 (from the past 24 hour(s))  Glucose, capillary     Status: Abnormal   Collection Time: 09/21/22  7:34 PM  Result Value Ref Range   Glucose-Capillary 135 (H) 70 - 99 mg/dL  Troponin I (High Sensitivity)     Status: None   Collection Time: 09/21/22  8:12 PM  Result Value Ref Range   Troponin I (High Sensitivity) 17 <18 ng/L  Blood gas, venous     Status: Abnormal   Collection Time: 09/21/22  8:12 PM  Result Value Ref Range   Delivery systems ROOM AIR    pH, Ven 7.46 (H) 7.25 - 7.43   pCO2, Ven 32 (L) 44 - 60 mmHg   pO2, Ven <31 (LL) 32 - 45 mmHg   Bicarbonate 22.8 20.0 - 28.0 mmol/L   Acid-base deficit 0.6 0.0 - 2.0 mmol/L   O2 Saturation 27 %   Patient temperature 37.0    Collection site VENOUS   Basic metabolic panel     Status: Abnormal   Collection Time: 09/21/22   8:12 PM  Result Value Ref Range   Sodium 130 (L) 135 - 145 mmol/L   Potassium 4.3 3.5 - 5.1 mmol/L   Chloride 96 (L) 98 - 111 mmol/L   CO2 23 22 - 32 mmol/L   Glucose, Bld 149 (H) 70 - 99 mg/dL   BUN 22 8 - 23 mg/dL   Creatinine, Ser 1.55 (H) 0.44 - 1.00 mg/dL   Calcium 8.2 (L) 8.9 - 10.3 mg/dL   GFR, Estimated 31 (L) >60 mL/min   Anion gap 11 5 - 15  D-dimer, quantitative     Status: Abnormal   Collection Time: 09/21/22  8:13 PM  Result Value Ref Range   D-Dimer, Quant 3.76 (H) 0.00 - 0.50 ug/mL-FEU  Troponin I (High Sensitivity)     Status: None   Collection Time: 09/21/22  9:49 PM  Result Value Ref Range   Troponin I (High Sensitivity) 14 <18 ng/L  Magnesium     Status: Abnormal   Collection Time: 09/21/22  9:49 PM  Result Value Ref Range   Magnesium 1.6 (L) 1.7 - 2.4 mg/dL  Phosphorus     Status: None   Collection Time: 09/21/22  9:49 PM  Result Value Ref Range   Phosphorus 2.9 2.5 - 4.6 mg/dL  CBC     Status: Abnormal   Collection Time: 09/22/22  3:14 AM  Result Value Ref Range   WBC 9.8 4.0 - 10.5 K/uL   RBC 2.24 (L) 3.87 - 5.11 MIL/uL   Hemoglobin 7.2 (L) 12.0 - 15.0 g/dL   HCT 21.0 (L) 36.0 - 46.0 %   MCV 93.8 80.0 - 100.0 fL   MCH 32.1 26.0 - 34.0 pg   MCHC 34.3 30.0 - 36.0 g/dL   RDW 11.9 11.5 - 15.5 %   Platelets 166 150 - 400 K/uL   nRBC 0.0 0.0 - 0.2 %  Basic metabolic panel     Status: Abnormal   Collection Time: 09/22/22  3:14 AM  Result Value Ref Range   Sodium 128 (L) 135 - 145 mmol/L   Potassium 4.2 3.5 - 5.1 mmol/L   Chloride 96 (L) 98 - 111 mmol/L   CO2 25 22 - 32 mmol/L   Glucose, Bld 114 (H) 70 - 99 mg/dL   BUN 21 8 - 23 mg/dL   Creatinine, Ser 1.44 (H) 0.44 - 1.00 mg/dL   Calcium 7.4 (L) 8.9 - 10.3 mg/dL   GFR, Estimated 34 (L) >60 mL/min   Anion gap 7 5 - 15  ABO/Rh     Status: None   Collection Time: 09/22/22  3:14 AM  Result Value Ref Range   ABO/RH(D)      O POS Performed at [facility], [address] [place],  [place] [address]   Prepare RBC (crossmatch)     Status: None   Collection Time: 09/22/22  6:10 AM  Result Value Ref Range   Order Confirmation      ORDER PROCESSED BY BLOOD BANK Performed at [facility], [address] [place], [place] [address]   Osmolality     Status: Abnormal   Collection Time: 09/22/22  8:59 AM  Result Value Ref Range   Osmolality 270 (L) 275 - 295 mOsm/kg    CT Angio Chest Pulmonary Embolism (PE) W or WO Contrast  Result Date: 09/21/2022 CLINICAL DATA:  Concern for pulmonary embolism. EXAM: CT ANGIOGRAPHY CHEST WITH CONTRAST TECHNIQUE: Multidetector CT imaging of the chest was performed using the standard protocol during bolus administration of intravenous contrast. Multiplanar CT image reconstructions and MIPs were obtained to evaluate the vascular anatomy. RADIATION DOSE REDUCTION: This exam was performed according to the departmental dose-optimization program which includes automated exposure control, adjustment of the mA and/or kV according to patient size and/or use of iterative reconstruction technique. CONTRAST:  78m OMNIPAQUE IOHEXOL 350 MG/ML SOLN COMPARISON:  Chest radiograph dated 09/21/2022. FINDINGS: Evaluation of this exam is limited due to respiratory motion artifact. Cardiovascular: There is no cardiomegaly or pericardial effusion. Three-vessel coronary vascular calcification. Mild atherosclerotic calcification of the thoracic aorta. No aneurysmal dilatation or dissection. Evaluation of the pulmonary arteries is limited due to respiratory motion. No pulmonary artery embolus identified. Mediastinum/Nodes: No hilar or mediastinal adenopathy. The esophagus is grossly unremarkable. There is a 12 mm left thyroid hypodense nodule. In the setting of significant comorbidities or limited life expectancy, no follow-up recommended (ref: J Am Coll Radiol. 2015 Feb;12(2): 143-50).No mediastinal fluid collection. Lungs/Pleura: Bibasilar subpleural scarring. No focal  consolidation, pleural effusion, or pneumothorax. There is a 5 mm subpleural nodule in the superior segment of the left lower lobe (44/6). The central airways are patent. Upper Abdomen: Cholecystectomy. Fatty liver. Partially visualized 4 cm  left renal cyst. Musculoskeletal: Osteopenia degenerative changes of the spine. Age indeterminate, likely chronic compression fracture of superior endplate of T3 with approximately 30% loss of vertebral body height and anterior wedging. Correlation with clinical exam and point tenderness recommended. No definite acute osseous pathology. Review of the MIP images confirms the above findings. IMPRESSION: 1. No acute intrathoracic pathology. No CT evidence of pulmonary artery embolus. 2. A 5 mm subpleural nodule in the superior segment of the left lower lobe. No follow-up needed if patient is low-risk.This recommendation follows the consensus statement: Guidelines for Management of Incidental Pulmonary Nodules Detected on CT Images: From the Fleischner Society 2017; Radiology 2017; 284:228-243. 3. Age indeterminate, likely chronic compression fracture of superior endplate of T3 with approximately 30% loss of vertebral body height and anterior wedging. Correlation with clinical exam and point tenderness recommended. 4. Fatty liver. 5.  Aortic Atherosclerosis (ICD10-I70.0). Electronically Signed   By: Anner Crete M.D.   On: 09/21/2022 22:43   DG Chest Port 1 View  Result Date: 09/21/2022 CLINICAL DATA:  Dyspnea EXAM: PORTABLE CHEST 1 VIEW COMPARISON:  Chest 09/20/2022 FINDINGS: The heart size and mediastinal contours are within normal limits. Both lungs are clear. The visualized skeletal structures are unremarkable. IMPRESSION: No active disease. Electronically Signed   By: Franchot Gallo M.D.   On: 09/21/2022 19:57   DG FEMUR, MIN 2 VIEWS RIGHT  Result Date: 09/21/2022 CLINICAL DATA:  Right hip fracture fixation. EXAM: RIGHT FEMUR 2 VIEWS COMPARISON:  Radiographs from  yesterday. FINDINGS: There is a long intramedullary rod in the femur with a proximal dynamic hip screw transfixing the intertrochanteric fracture with anatomic reduction. Two distal interlocking screws are also noted. IMPRESSION: Internal fixation of intertrochanteric right hip fracture with anatomic reduction. Electronically Signed   By: Marijo Sanes M.D.   On: 09/21/2022 15:39   DG HIP UNILAT WITH PELVIS 2-3 VIEWS RIGHT  Result Date: 09/21/2022 CLINICAL DATA:  Elective surgery.  Right femoral intramedullary nail EXAM: DG HIP (WITH OR WITHOUT PELVIS) 2-3V RIGHT COMPARISON:  Right hip radiographs 09/20/2022 FINDINGS: Images were performed intraoperatively without the presence of a radiologist. The patient is undergoing long cephalomedullary nail fixation of the right femur. The patient is seen intertrochanteric fracture is now normally aligned. Total fluoroscopy images: 4 Total fluoroscopy time: 100 seconds Total dose: Radiation Exposure Index (as provided by the fluoroscopic device): 20.92 mGy air Kerma Please see intraoperative findings for further detail. IMPRESSION: Intraoperative fluoroscopic guidance for long cephalomedullary nail fixation of the right femur. Electronically Signed   By: Yvonne Kendall M.D.   On: 09/21/2022 14:41   DG C-Arm 1-60 Min-No Report  Result Date: 09/21/2022 Fluoroscopy was utilized by the requesting physician.  No radiographic interpretation.   DG Chest 1 View  Result Date: 09/20/2022 CLINICAL DATA:  Fall EXAM: CHEST  1 VIEW COMPARISON:  09/27/2020 FINDINGS: Low lung volumes. No acute airspace disease or effusion. Upper normal cardiomediastinal silhouette augmented by low lung volume. No pneumothorax IMPRESSION: No active disease.  Low lung volume Electronically Signed   By: Donavan Foil M.D.   On: 09/20/2022 18:42   DG Hip Unilat W or Wo Pelvis 2-3 Views Right  Result Date: 09/20/2022 CLINICAL DATA:  Hip pain with fall EXAM: DG HIP (WITH OR WITHOUT PELVIS) 2-3V  RIGHT COMPARISON:  None Available. FINDINGS: Hardware in the lower lumbar spine. Pubic symphysis and rami appear intact. Multiple clips in the pelvis. Acute comminuted intertrochanteric fracture with displacement. No femoral head dislocation IMPRESSION: Acute comminuted and  displaced right intertrochanteric fracture Electronically Signed   By: Donavan Foil M.D.   On: 09/20/2022 18:41    Assessment/Plan: 1 Day Post-Op   Principal Problem:   Intertrochanteric fracture of right femur [facility]) Active Problems:   Hypothyroidism   Hypertension  Patient is doing well from an orthopedic standpoint.  She is not having any significant swelling in the right side.  Her incisions are clean dry and intact.  Continue with physical therapy.  Patient will have her labs redrawn in the morning to check for anemia.  We will begin Lovenox today for DVT prophylaxis.  Patient need a skilled nursing facility upon discharge.  I recommend continuing Lovenox while at skilled nursing facility.  Patient will follow-up with me in the office in 10 to 14 days for wound check and staple removal.    Thornton Park , MD 09/22/2022, 1:55 PM

## 2022-09-22 NOTE — NC FL2 (Signed)
[place] LEVEL OF CARE SCREENING TOOL     IDENTIFICATION  Patient Name: Pamela Paul Birthdate: December 29, 1925 Sex: female Admission Date (Current Location): 09/20/2022  [facility] and [place] Number:  Engineering geologist and Address:  [facility], [address] [place], [place] [address]      Provider Number: [id number]  Attending Physician Name and Address:  Emeterio Reeve, DO  Relative Name and Phone Number:  Shanon Brow, son [phone]    Current Level of Care: Hospital Recommended Level of Care: [place] Prior Approval Number:    Date Approved/Denied:   PASRR Number: [id number]  Discharge Plan: SNF    Current Diagnoses: Patient Active Problem List   Diagnosis Date Noted   Intertrochanteric fracture of right femur ([place]) 09/21/2022   Actinic keratosis 04/27/2022   Chronic knee pain after total replacement of left knee joint 09/21/2021   Malignant neoplasm of upper-outer quadrant of right breast in female, estrogen receptor positive ([place]) 08/02/2021   Secondary hyperparathyroidism of renal origin ([place]) 10/10/2019   Acute postoperative anemia due to expected blood loss 02/02/2019   Spinal stenosis of lumbar region 01/25/2017   Hypertension 01/25/2017   Vitamin D deficiency 01/14/2017   Advanced directives, counseling/discussion 07/30/2015   Renal mass, left 06/28/2015   CKD (chronic kidney disease), stage IV ([place]) 04/20/2014   S/P lumbar fusion 01/05/2014   Acquired spondylolisthesis 12/29/2011   Hyponatremia 12/17/2011   Hypothyroidism    Macular degeneration (senile) of retina 03/21/2011   Degenerative drusen 03/21/2011    Orientation RESPIRATION BLADDER Height & Weight     Self, Time, Situation, Place  Normal Continent, External catheter Weight: 83.9 kg Height:  '5\' 5"'$  (165.1 cm)  BEHAVIORAL SYMPTOMS/MOOD NEUROLOGICAL BOWEL NUTRITION STATUS      Continent Diet (see dc summary)  AMBULATORY STATUS  COMMUNICATION OF NEEDS Skin   Extensive Assist Verbally Normal, Surgical wounds                       Personal Care Assistance Level of Assistance  Bathing, Feeding, Dressing Bathing Assistance: Limited assistance Feeding assistance: Limited assistance Dressing Assistance: Maximum assistance     Functional Limitations Info  Speech, Hearing, Sight Sight Info: Adequate Hearing Info: Adequate Speech Info: Adequate    SPECIAL CARE FACTORS FREQUENCY  PT (By licensed PT), OT (By licensed OT)     PT Frequency: 5 times per week OT Frequency: 5 times per week            Contractures Contractures Info: Not present    Additional Factors Info  Code Status, Allergies Code Status Info: full code Allergies Info: Bactrim           Current Medications (09/22/2022):  This is the current hospital active medication list Current Facility-Administered Medications  Medication Dose Route Frequency Provider Last Rate Last Admin   acetaminophen (TYLENOL) tablet 325-650 mg  325-650 mg Oral Q6H PRN Thornton Park, MD       acetaminophen (TYLENOL) tablet 500 mg  500 mg Oral Q6H Thornton Park, MD   500 mg at 09/21/22 1750   alum & mag hydroxide-simeth (MAALOX/MYLANTA) 200-200-20 MG/5ML suspension 30 mL  30 mL Oral Q4H PRN Thornton Park, MD       bisacodyl (DULCOLAX) suppository 10 mg  10 mg Rectal Daily PRN Thornton Park, MD       docusate sodium (COLACE) capsule 100 mg  100 mg Oral BID Thornton Park, MD   100 mg at 09/22/22  0841   enoxaparin (LOVENOX) injection 30 mg  30 mg Subcutaneous Q24H Thornton Park, MD   30 mg at 09/22/22 0839   HYDROcodone-acetaminophen (NORCO/VICODIN) 5-325 MG per tablet 1 tablet  1 tablet Oral Q6H PRN Sharion Settler, NP   1 tablet at 09/22/22 0840   irbesartan (AVAPRO) tablet 75 mg  75 mg Oral Daily Thornton Park, MD   75 mg at 09/22/22 0840   levothyroxine (SYNTHROID) tablet 50 mcg  50 mcg Oral Q0600 Thornton Park, MD   50 mcg at 09/22/22  0538   methocarbamol (ROBAXIN) tablet 500 mg  500 mg Oral Q6H PRN Thornton Park, MD       Or   methocarbamol (ROBAXIN) 500 mg in dextrose 5 % 50 mL IVPB  500 mg Intravenous Q6H PRN Thornton Park, MD       morphine (PF) 2 MG/ML injection 0.5-1 mg  0.5-1 mg Intravenous Q2H PRN Thornton Park, MD       naloxone [facility]) injection 0.4 mg  0.4 mg Intravenous PRN Sharion Settler, NP   0.4 mg at 09/21/22 2008   ondansetron (ZOFRAN) tablet 4 mg  4 mg Oral Q6H PRN Thornton Park, MD       Or   ondansetron [facility]) injection 4 mg  4 mg Intravenous Q6H PRN Thornton Park, MD       pantoprazole (PROTONIX) injection 40 mg  40 mg Intravenous Q24H Sharion Settler, NP   40 mg at 09/22/22 0839   polyethylene glycol (MIRALAX / GLYCOLAX) packet 17 g  17 g Oral Daily PRN Thornton Park, MD       senna Donavan Burnet) tablet 8.6 mg  1 tablet Oral BID Thornton Park, MD   8.6 mg at 09/22/22 0840     Discharge Medications: Please see discharge summary for a list of discharge medications.  Relevant Imaging Results:  Relevant Lab Results:   Additional Information SS# [id number]  Conception Oms, RN

## 2022-09-22 NOTE — Progress Notes (Addendum)
PROGRESS NOTE    Pamela Paul   ZHG:[id number] DOB: 1926-10-03  DOA: 09/20/2022 Date of Service: 09/22/22 PCP: Pamela Shorter, MD     Brief Narrative / Hospital Course:  Ms. Pamela Paul, a 88 y/o with hypothyroidism, h/o breast cancer, CKD, had a mechanical fall at home with immediate pain and inability to bear weight. She presents to [facility] for evaluation 09/20/22.   10/18-10/19: intertrochanteric fracture of right femur, orthopedics consulted, intramedullary right nail femur done 09/21/22. Plan SNF placement.  Overnight 10/19 respiratory distress after receiving fentanyl for treatment of her hip pain, requiring narcan x2 doses which resulted in improvement. CTA r/o PE. Troponin not elevated. Remains on telemetry.  10/20 doing well on rounds, no concerns. Await SNF placement. Lovenox while at skilled nursing facility.  Patient will follow-up with Dr Pamela Paul in the office in 10 to 14 days for wound check and staple removal.  Consultants:  Orthopedics   Procedures: Intramedullary nail right femur 09/21/2022      ASSESSMENT & PLAN:   Principal Problem:   Intertrochanteric fracture of right femur ([place]) Active Problems:   Hypothyroidism   Hypertension   Hyponatremia   Acute postoperative anemia due to expected blood loss   Intertrochanteric fracture of right femur (HCC) Low/moderate risk for low risk surgery Orthopedics service following   Hypothyroidism TSH 08/17/21 with good control. TSH pending Continue present dose of synthroid - adjust dose as needed based on lab  Postoperative anemia S/p 1 unit PRBC per surgery team   Hyponatremia  Pending Osm, Urine sodium, Urine osm   Hypertension BP controlled Continue home medications  CKD 3a/b Trend BMP   DVT prophylaxis: lovenox - low threshold to hold this if bleeding  Pertinent IV fluids/nutrition: cleared for diet by SLP  Central lines / invasive devices: none  Code Status: FULL CODE Family  Communication: family at bedside on rounds   Disposition: inpatient  TOC needs: need SNF for rehab Barriers to discharge / significant pending items: SNF placement pending for rehab post op              Subjective:  Patient reports feeling better today, no chest pain or trouble breathing, pain is controlled        Objective:  Vitals:   09/22/22 1005 09/22/22 1016 09/22/22 1109 09/22/22 1315  BP: (!) 100/54 (!) 97/46 (!) 90/58 (!) 101/53  Pulse: 75 74  81  Resp: '18 18  16  '$ Temp: (!) 97.5 F (36.4 C) 97.7 F (36.5 C)  98 F (36.7 C)  TempSrc: Oral Oral  Oral  SpO2: 97% 95%    Weight:      Height:        Intake/Output Summary (Last 24 hours) at 09/22/2022 1404 Last data filed at 09/22/2022 1315 Gross per 24 hour  Intake 2555.55 ml  Output 550 ml  Net 2005.55 ml   Filed Weights   09/20/22 1805 09/21/22 0514  Weight: 83.9 kg 83.9 kg    Examination: Constitutional:  VS as above General Appearance: alert, well-developed, well-nourished, NAD Respiratory: Normal respiratory effort No wheeze No rhonchi No rales Cardiovascular: S1/S2 normal No murmur No rub/gallop auscultated No lower extremity edema Gastrointestinal: No tenderness Musculoskeletal:  No clubbing/cyanosis of digits Symmetrical movement in all extremities Neurological: No cranial nerve deficit on limited exam Alert Psychiatric: Normal judgment/insight Normal mood and affect       Scheduled Medications:   acetaminophen  500 mg Oral Q6H   docusate sodium  100 mg Oral BID  enoxaparin (LOVENOX) injection  30 mg Subcutaneous Q24H   irbesartan  75 mg Oral Daily   levothyroxine  50 mcg Oral Q0600   pantoprazole (PROTONIX) IV  40 mg Intravenous Q24H   senna  1 tablet Oral BID    Continuous Infusions:  methocarbamol (ROBAXIN) IV      PRN Medications:  acetaminophen, alum & mag hydroxide-simeth, bisacodyl, HYDROcodone-acetaminophen, methocarbamol **OR** methocarbamol  (ROBAXIN) IV, morphine injection, naLOXone (NARCAN)  injection, ondansetron **OR** ondansetron (ZOFRAN) IV, polyethylene glycol  Antimicrobials:  Anti-infectives (From admission, onward)    Start     Dose/Rate Route Frequency Ordered Stop   09/21/22 1900  ceFAZolin (ANCEF) IVPB 2g/100 mL premix        2 g 200 mL/hr over 30 Minutes Intravenous Every 6 hours 09/21/22 1641 09/22/22 0838   09/21/22 1131  ceFAZolin (ANCEF) 2-4 GM/100ML-% IVPB       Note to Pharmacy: Herby Abraham W: cabinet override      09/21/22 1131 09/22/22 0838   09/21/22 0600  ceFAZolin (ANCEF) IVPB 2g/100 mL premix        2 g 200 mL/hr over 30 Minutes Intravenous On call to O.R. 09/20/22 2229 09/22/22 0559       Data Reviewed: I have personally reviewed following labs and imaging studies  CBC: Recent Labs  Lab 09/20/22 1807 09/22/22 0314  WBC 8.5 9.8  NEUTROABS 5.6  --   HGB 11.8* 7.2*  HCT 35.4* 21.0*  MCV 95.7 93.8  PLT 247 952   Basic Metabolic Panel: Recent Labs  Lab 09/20/22 1807 09/21/22 2012 09/21/22 2149 09/22/22 0314  NA 133* 130*  --  128*  K 3.8 4.3  --  4.2  CL 98 96*  --  96*  CO2 26 23  --  25  GLUCOSE 117* 149*  --  114*  BUN 22 22  --  21  CREATININE 1.67* 1.55*  --  1.44*  CALCIUM 9.3 8.2*  --  7.4*  MG  --   --  1.6*  --   PHOS  --   --  2.9  --    GFR: Estimated Creatinine Clearance: 25.6 mL/min (A) (by C-G formula based on SCr of 1.44 mg/dL (H)). Liver Function Tests: No results for input(s): "AST", "ALT", "ALKPHOS", "BILITOT", "PROT", "ALBUMIN" in the last 168 hours. No results for input(s): "LIPASE", "AMYLASE" in the last 168 hours. No results for input(s): "AMMONIA" in the last 168 hours. Coagulation Profile: No results for input(s): "INR", "PROTIME" in the last 168 hours. Cardiac Enzymes: No results for input(s): "CKTOTAL", "CKMB", "CKMBINDEX", "TROPONINI" in the last 168 hours. BNP (last 3 results) No results for input(s): "PROBNP" in the last 8760  hours. HbA1C: No results for input(s): "HGBA1C" in the last 72 hours. CBG: Recent Labs  Lab 09/21/22 1934  GLUCAP 135*   Lipid Profile: No results for input(s): "CHOL", "HDL", "LDLCALC", "TRIG", "CHOLHDL", "LDLDIRECT" in the last 72 hours. Thyroid Function Tests: No results for input(s): "TSH", "T4TOTAL", "FREET4", "T3FREE", "THYROIDAB" in the last 72 hours. Anemia Panel: No results for input(s): "VITAMINB12", "FOLATE", "FERRITIN", "TIBC", "IRON", "RETICCTPCT" in the last 72 hours. Urine analysis:    Component Value Date/Time   BILIRUBINUR small 12/28/2014 0956   PROTEINUR 30 12/28/2014 0956   UROBILINOGEN 0.2 12/28/2014 0956   NITRITE neg 12/28/2014 0956   LEUKOCYTESUR large (3+) 12/28/2014 0956   Sepsis Labs: '@LABRCNTIP'$ (procalcitonin:4,lacticidven:4)  No results found for this or any previous visit (from the past 240 hour(s)).  Radiology Studies: CT Angio Chest Pulmonary Embolism (PE) W or WO Contrast  Result Date: 09/21/2022 CLINICAL DATA:  Concern for pulmonary embolism. EXAM: CT ANGIOGRAPHY CHEST WITH CONTRAST TECHNIQUE: Multidetector CT imaging of the chest was performed using the standard protocol during bolus administration of intravenous contrast. Multiplanar CT image reconstructions and MIPs were obtained to evaluate the vascular anatomy. RADIATION DOSE REDUCTION: This exam was performed according to the departmental dose-optimization program which includes automated exposure control, adjustment of the mA and/or kV according to patient size and/or use of iterative reconstruction technique. CONTRAST:  58m OMNIPAQUE IOHEXOL 350 MG/ML SOLN COMPARISON:  Chest radiograph dated 09/21/2022. FINDINGS: Evaluation of this exam is limited due to respiratory motion artifact. Cardiovascular: There is no cardiomegaly or pericardial effusion. Three-vessel coronary vascular calcification. Mild atherosclerotic calcification of the thoracic aorta. No aneurysmal dilatation or  dissection. Evaluation of the pulmonary arteries is limited due to respiratory motion. No pulmonary artery embolus identified. Mediastinum/Nodes: No hilar or mediastinal adenopathy. The esophagus is grossly unremarkable. There is a 12 mm left thyroid hypodense nodule. In the setting of significant comorbidities or limited life expectancy, no follow-up recommended (ref: J Am Coll Radiol. 2015 Feb;12(2): 143-50).No mediastinal fluid collection. Lungs/Pleura: Bibasilar subpleural scarring. No focal consolidation, pleural effusion, or pneumothorax. There is a 5 mm subpleural nodule in the superior segment of the left lower lobe (44/6). The central airways are patent. Upper Abdomen: Cholecystectomy. Fatty liver. Partially visualized 4 cm left renal cyst. Musculoskeletal: Osteopenia degenerative changes of the spine. Age indeterminate, likely chronic compression fracture of superior endplate of T3 with approximately 30% loss of vertebral body height and anterior wedging. Correlation with clinical exam and point tenderness recommended. No definite acute osseous pathology. Review of the MIP images confirms the above findings. IMPRESSION: 1. No acute intrathoracic pathology. No CT evidence of pulmonary artery embolus. 2. A 5 mm subpleural nodule in the superior segment of the left lower lobe. No follow-up needed if patient is low-risk.This recommendation follows the consensus statement: Guidelines for Management of Incidental Pulmonary Nodules Detected on CT Images: From the Fleischner Society 2017; Radiology 2017; 284:228-243. 3. Age indeterminate, likely chronic compression fracture of superior endplate of T3 with approximately 30% loss of vertebral body height and anterior wedging. Correlation with clinical exam and point tenderness recommended. 4. Fatty liver. 5.  Aortic Atherosclerosis (ICD10-I70.0). Electronically Signed   By: AAnner CreteM.D.   On: 09/21/2022 22:43   DG Chest Port 1 View  Result Date:  09/21/2022 CLINICAL DATA:  Dyspnea EXAM: PORTABLE CHEST 1 VIEW COMPARISON:  Chest 09/20/2022 FINDINGS: The heart size and mediastinal contours are within normal limits. Both lungs are clear. The visualized skeletal structures are unremarkable. IMPRESSION: No active disease. Electronically Signed   By: CFranchot GalloM.D.   On: 09/21/2022 19:57   DG FEMUR, MIN 2 VIEWS RIGHT  Result Date: 09/21/2022 CLINICAL DATA:  Right hip fracture fixation. EXAM: RIGHT FEMUR 2 VIEWS COMPARISON:  Radiographs from yesterday. FINDINGS: There is a long intramedullary rod in the femur with a proximal dynamic hip screw transfixing the intertrochanteric fracture with anatomic reduction. Two distal interlocking screws are also noted. IMPRESSION: Internal fixation of intertrochanteric right hip fracture with anatomic reduction. Electronically Signed   By: PMarijo SanesM.D.   On: 09/21/2022 15:39   DG HIP UNILAT WITH PELVIS 2-3 VIEWS RIGHT  Result Date: 09/21/2022 CLINICAL DATA:  Elective surgery.  Right femoral intramedullary nail EXAM: DG HIP (WITH OR WITHOUT PELVIS) 2-3V RIGHT COMPARISON:  Right hip radiographs  09/20/2022 FINDINGS: Images were performed intraoperatively without the presence of a radiologist. The patient is undergoing long cephalomedullary nail fixation of the right femur. The patient is seen intertrochanteric fracture is now normally aligned. Total fluoroscopy images: 4 Total fluoroscopy time: 100 seconds Total dose: Radiation Exposure Index (as provided by the fluoroscopic device): 20.92 mGy air Kerma Please see intraoperative findings for further detail. IMPRESSION: Intraoperative fluoroscopic guidance for long cephalomedullary nail fixation of the right femur. Electronically Signed   By: Yvonne Kendall M.D.   On: 09/21/2022 14:41   DG C-Arm 1-60 Min-No Report  Result Date: 09/21/2022 Fluoroscopy was utilized by the requesting physician.  No radiographic interpretation.   DG Chest 1 View  Result  Date: 09/20/2022 CLINICAL DATA:  Fall EXAM: CHEST  1 VIEW COMPARISON:  09/27/2020 FINDINGS: Low lung volumes. No acute airspace disease or effusion. Upper normal cardiomediastinal silhouette augmented by low lung volume. No pneumothorax IMPRESSION: No active disease.  Low lung volume Electronically Signed   By: Donavan Foil M.D.   On: 09/20/2022 18:42   DG Hip Unilat W or Wo Pelvis 2-3 Views Right  Result Date: 09/20/2022 CLINICAL DATA:  Hip pain with fall EXAM: DG HIP (WITH OR WITHOUT PELVIS) 2-3V RIGHT COMPARISON:  None Available. FINDINGS: Hardware in the lower lumbar spine. Pubic symphysis and rami appear intact. Multiple clips in the pelvis. Acute comminuted intertrochanteric fracture with displacement. No femoral head dislocation IMPRESSION: Acute comminuted and displaced right intertrochanteric fracture Electronically Signed   By: Donavan Foil M.D.   On: 09/20/2022 18:41            LOS: 1 day       Emeterio Reeve, DO [facility] 09/22/2022, 2:04 PM   Staff may message me via secure chat in [place]  but this may not receive immediate response,  please page for urgent matters!  If 7PM-7AM, please contact night-coverage [email or web address]  Dictation software was used to generate the above note. Typos may occur and escape review, as with typed/written notes. Please contact Dr Sheppard Coil directly for clarity if needed.

## 2022-09-22 NOTE — Evaluation (Addendum)
Clinical/Bedside Swallow Evaluation Patient Details  Name: Pamela Paul MRN: [id number] Date of Birth: 1926/07/18  Today's Date: 09/22/2022 Time: SLP Start Time (ACUTE ONLY): 1001 SLP Stop Time (ACUTE ONLY): 1040 SLP Time Calculation (min) (ACUTE ONLY): 39 min  Past Medical History:  Past Medical History:  Diagnosis Date   Breast cancer (HCC)    CKD (chronic kidney disease) stage 3, GFR 30-59 ml/min (HCC)    Former smoker    Heart disease    mitral valvular   Hyperlipidemia    Hyperparathyroidism, secondary (HCC)    Hypertension    Hypothyroidism    Sensorineural hearing loss    Spinal stenosis of lumbar region 2006   s/p surgery   Past Surgical History:  Past Surgical History:  Procedure Laterality Date   ABDOMINAL HYSTERECTOMY     bilateral cataract surgery     BREAST LUMPECTOMY Right 07/22/2021   CHOLECYSTECTOMY  12/04/1978   FRACTURE SURGERY Left 12/29/2018   FEMUR   JOINT REPLACEMENT     Bilateral knee   LAPAROSCOPIC SIGMOID COLECTOMY  12/04/2009   secondary to benign mass   MELANOMA EXCISION WITH SENTINEL LYMPH NODE BIOPSY Right 07/22/2021   Procedure: MELANOMA EXCISION WITH SENTINEL LYMPH NODE BIOPSY;  Surgeon: Carolan Shiver, MD;  Location: [facility] ORS;  Service: General;  Laterality: Right;   PART MASTECTOMY,RADIO FREQUENCY LOCALIZER,AXILLARY SENTINEL NODE BIOPSY Right 07/22/2021   Procedure: PART MASTECTOMY,RADIO FREQUENCY LOCALIZER,AXILLARY SENTINEL NODE BIOPSY;  Surgeon: Carolan Shiver, MD;  Location: [facility] ORS;  Service: General;  Laterality: Right;   REPLACEMENT TOTAL KNEE BILATERAL     SMALL INTESTINE SURGERY  12/04/2008   for SBO   SPINE SURGERY  12/04/2004   [facility], [facility]   tendon repair     achille heall, left    HPI:  Pt is a 88 y/o F admitted on 09/20/22 after presenting 2/2 falling at home & inability to bear weight. Pt found to have R femur intertrochanteric fx. Pt underwent R IM nailing on 09/21/22.    PMH: hypothyroid, h/o breast  CA, CKD.    CXR: Bibasilar subpleural scarring. No focal consolidation,  pleural effusion, or pneumothorax.  DG Barium Study(GI) in 2021: esophageal dysmotility. Small sliding hiatal hernia. On a PPI per chart.   Assessment / Plan / Recommendation  Clinical Impression   Pt seen for BSE this morning. Pt A/O x4; engaged easily and eager to learn any information to "help swallow Pills easier". NSG and pt/Son reported intermittent difficulty swallowing Pills last night - pt had recent surgery s/p Fall and hip fx.   On RA; afebrile, WBC wnl.    OF NOTE: Pt did not strongly endorse s/s of REFLUX at home. HOWEVER, a DG Barium Study(GI) in 2021: esophageal dysmotility. Small sliding hiatal hernia. Son stated her MD had told her to "chew her foods well". Pt is on a PPI.   Pt appears to present w/ adequate oropharyngeal phase swallowing function w/ No overt oropharyngeal phase dysphagia appreciated during oral intake of trials given, breakfast meal this morning -- No neuromuscular swallowing deficits appreciated. Pt appears at reduced risk for aspiration from an oropharyngeal phase standpoint following general aspiration precautions.   HOWEVER, pt has a baseline presentation of Esophageal dysmotility. Small sliding hiatal hernia, per DG Barium Study(GI) in 2021. ANY Dysmotility or Regurgitation of Reflux material can increase risk for aspiration of the Reflux material during Retrograde flow thus impact Voicing and Pulmonary status. Pt and Son endorsed pt had issues w/ globus w/ certain foods and  difficulty eating a sandwich last night.    Pt sat upright in bed and consumed several trials of thin liquids Via Straw; purees and solid foods at breakfast meal w/ No immediate, overt clinical s/s of aspiration noted; clear vocal quality b/t trials, no decline in pulmonary status, no multiple swallows noted post initial pharyngeal swallow, no decline in O2 sats per NSG report. Oral phase appeared [facility] for bolus  management and timely A-P transfer/clearing of material.  OM exam was [facility] for oral clearing; lingual/labial movements. No unilateral weakness. Speech clear.    Recommend continue a Regular diet (moistened, CUT  foods) w/ thin liquids-- monitor any problematic solid foods such as meats and breads. General aspiration precautions. Rest Breaks during meals/oral intake to allow for Esophageal clearing. REFLUX precautions strongly recommended to lessen chance for Regurgitation -- HOB elevated at night when sleeping.    Recommend pt f/u w/ GI for assessment/management of Reflux and tx as indicated. Discussion and handouts given on REFLUX, impact of REFLUX on swallowing, behaviors to manage REFLUX, and foods/diet. MD to reconsult ST services if any new needs while admitted. NSG updated. Pt appreciative of Education information. SLP Visit Diagnosis: Dysphagia, unspecified (R13.10) (Baseline Hiatal Hernia and Esophageal Phase Dysmotility per Imaging, chart)    Aspiration Risk   (reduced from an oropharyngeal phase standpoint)    Diet Recommendation   continue a Regular diet (moistened, CUT  foods) w/ thin liquids-- monitor any problematic solid foods such as meats and breads. General aspiration precautions. Rest Breaks during meals/oral intake to allow for Esophageal clearing. REFLUX precautions strongly recommended to lessen chance for Regurgitation -- HOB elevated at night when sleeping and do not lie down post meals.  Medication Administration: Whole meds with puree (as needed for ease of swallowing)    Other  Recommendations Recommended Consults: Consider GI evaluation;Consider esophageal assessment (f/u for Educationa and management) Oral Care Recommendations: Oral care BID;Oral care before and after PO;Patient independent with oral care Other Recommendations:  (n/a)    Recommendations for follow up therapy are one component of a multi-disciplinary discharge planning process, led by the attending  physician.  Recommendations may be updated based on patient status, additional functional criteria and insurance authorization.  Follow up Recommendations No SLP follow up - appears at her baseline     Assistance Recommended at Discharge PRN  Functional Status Assessment Patient has not had a recent decline in their functional status (no decline in swallowing; intermittent difficulty swallowing Pills)  Frequency and Duration  (n/a)   (n/a)       Prognosis Prognosis for Safe Diet Advancement: Good Barriers to Reach Goals: Time post onset;Severity of deficits Barriers/Prognosis Comment: Baseline Hiatal Hernia and Esophageal Phase Dysmotility per Imaging, chart      Swallow Study   General Date of Onset: 09/20/22 HPI: Pt is a 88 y/o F admitted on 09/20/22 after presenting 2/2 falling at home & inability to bear weight. Pt found to have R femur intertrochanteric fx. Pt underwent R IM nailing on 09/21/22.    PMH: hypothyroid, h/o breast CA, CKD.   CXR: Bibasilar subpleural scarring. No focal consolidation,  pleural effusion, or pneumothorax.  DG Barium Study(GI) in 2021: esophageal dysmotility. Small sliding hiatal hernia. Type of Study: Bedside Swallow Evaluation Previous Swallow Assessment: none; GI Diet Prior to this Study: Regular;Thin liquids Temperature Spikes Noted: No (wbc 9.8) Respiratory Status: Room air History of Recent Intubation: No Behavior/Cognition: Alert;Cooperative;Pleasant mood Oral Cavity Assessment: Within Functional Limits Oral Care Completed by SLP:  Recent completion by staff Oral Cavity - Dentition: Adequate natural dentition Vision: Functional for self-feeding Self-Feeding Abilities: Able to feed self;Needs set up Patient Positioning: Upright in bed (min more upright for drinking liquids) Baseline Vocal Quality: Normal Volitional Cough: Strong Volitional Swallow: Able to elicit    Oral/Motor/Sensory Function Overall Oral Motor/Sensory Function: Within  functional limits   Ice Chips Ice chips: Not tested   Thin Liquid Thin Liquid: Within functional limits Presentation: Self Fed;Straw (6+ trials)    Nectar Thick Nectar Thick Liquid: Not tested   Honey Thick Honey Thick Liquid: Not tested   Puree Puree: Not tested   Solid     Solid: Within functional limits (breakfast meal w/ NSG, Son) Presentation: Self Fed        Jerilynn Som, MS, CCC-SLP Speech Language Pathologist Rehab Services; [facility] [phone] (ascom)  Rakan Soffer 09/22/2022,11:32 AM

## 2022-09-22 NOTE — Progress Notes (Addendum)
Physical Therapy Treatment Patient Details Name: Pamela Paul MRN: [id number] DOB: [birth year] Today's Date: 09/22/2022   History of Present Illness Pt is a 88 y/o F admitted on 09/20/22 after presenting 2/2 falling at home & inability to bear weight. Pt found to have R femur intertrochanteric fx. Pt underwent R IM nailing on 09/21/22.  PMH: hypothyroid, h/o breast CA, CKD    PT Comments    Pt seen for PT tx with pt agreeable to tx. Pt with c/o L rib pain at rest, R thigh pain with movement - MD notified of L rib pain. Pt requires ongoing cuing throughout for all mobility tasks & reliance on hospital bed features, DME. Pt requires max assist for supine>sit. Pt completes STS x 2 from elevated EOB with max assist with cuing for hand placement, technique & PT blocking R knee. On 2nd attempt pt with improvement in upright posture but tolerates standing <5 seconds. Pt completes lateral scoot bed>drop arm recliner on L with PT providing MAX cuing for head/hips relationship, sequencing, and technique as well as max assist for scooting. Pt does c/o dizziness throughout but vitals stable, see below. Continue to recommend STR upon d/c.  Of note, pt with hx of breast CA but no pink restriction bracelet noted on pt. Notified nurse & requested she f/u on this.  Addendum: Educated pt on use & frequency of use of incentive spirometer with fair return demo. Pt will benefit from further education/review.    Recommendations for follow up therapy are one component of a multi-disciplinary discharge planning process, led by the attending physician.  Recommendations may be updated based on patient status, additional functional criteria and insurance authorization.  Follow Up Recommendations  Skilled nursing-short term rehab (<3 hours/day) Can patient physically be transported by private vehicle: No   Assistance Recommended at Discharge Frequent or constant Supervision/Assistance  Patient can return home with the  following Two people to help with walking and/or transfers;Two people to help with bathing/dressing/bathroom;Assist for transportation;Assistance with cooking/housework   Equipment Recommendations  None recommended by PT    Recommendations for Other Services       Precautions / Restrictions Precautions Precautions: Fall Restrictions Weight Bearing Restrictions: Yes RLE Weight Bearing: Weight bearing as tolerated     Mobility  Bed Mobility Overal bed mobility: Needs Assistance Bed Mobility: Supine to Sit     Supine to sit: Max assist, HOB elevated Sit to supine: Max assist, +2 for physical assistance   General bed mobility comments: ongoing cuing re: technique & use of bed rails, still requires max assist to fully upright trunk and scoot to sitting EOB    Transfers Overall transfer level: Needs assistance Equipment used: Rolling walker (2 wheels) Transfers: Sit to/from Stand, Bed to chair/wheelchair/BSC Sit to Stand: Max assist, From elevated surface          Lateral/Scoot Transfers: Max assist (bed>recliner on L side, max cuing for sequencing/technique, manual faciltation for head/hips relationship, max assist to successfully scoot to recliner) General transfer comment: STS from EOB x 2 with RW, max cuing re: safe hand placement, extra time to transition LUE from pushing on bed to RW, cuing for upright posture once in standing & increased glute/hip extensor activation to assist with shifting pelvis anteriorly, PT blocking R knee to prevent buckling    Ambulation/Gait                   Stairs             Wheelchair  Mobility    Modified Rankin (Stroke Patients Only)       Balance Overall balance assessment: Needs assistance Sitting-balance support: Feet supported, Bilateral upper extremity supported Sitting balance-Leahy Scale: Poor Sitting balance - Comments: min assist astatic sitting EOB with BUE support   Standing balance support: During  functional activity, Reliant on assistive device for balance, Bilateral upper extremity supported Standing balance-Leahy Scale: Zero                              Cognition Arousal/Alertness: Awake/alert Behavior During Therapy: WFL for tasks assessed/performed Overall Cognitive Status: Within Functional Limits for tasks assessed                                 General Comments: Pt is AxOx4 with extra time to recall current month & year. Follows commands with extra time.        Exercises General Exercises - Lower Extremity Ankle Circles/Pumps: AROM, Both, 10 reps, Supine Quad Sets: AROM, Strengthening, Right, 10 reps, Supine Short Arc Quad: AAROM, Supine, Strengthening, Right, 10 reps Long Arc Quad: AROM, Right, Strengthening, 10 reps, Seated Heel Slides: AAROM, Supine, Strengthening, Right, 10 reps Hip ABduction/ADduction: AAROM, Supine, Strengthening, Right, 10 reps (hip abduction slides x 10)    General Comments General comments (skin integrity, edema, etc.): Pt c/o dizziness throughout session. In recliner at end of session BP 125/50 mmHg MAP 71 (checked in LUE), SpO2 98% on room air, HR 79 bpm.      Pertinent Vitals/Pain Pain Assessment Pain Assessment: Faces Faces Pain Scale: Hurts even more Pain Location: L ribs, R thigh Pain Descriptors / Indicators: Discomfort, Guarding, Grimacing Pain Intervention(s): Monitored during session, Repositioned    Home Living Family/patient expects to be discharged to:: Assisted living                 Home Equipment: Rollator (4 wheels) Additional Comments: 2nd floor apartment with elevator access at [facility]    Prior Function            PT Goals (current goals can now be found in the care plan section) Acute Rehab PT Goals Patient Stated Goal: get better, go to rehab PT Goal Formulation: With patient/family Time For Goal Achievement: 10/06/22 Potential to Achieve Goals: Good Progress  towards PT goals: Progressing toward goals    Frequency    BID      PT Plan Current plan remains appropriate    Co-evaluation              AM-PAC PT "6 Clicks" Mobility   Outcome Measure  Help needed turning from your back to your side while in a flat bed without using bedrails?: A Lot Help needed moving from lying on your back to sitting on the side of a flat bed without using bedrails?: Total Help needed moving to and from a bed to a chair (including a wheelchair)?: Total Help needed standing up from a chair using your arms (e.g., wheelchair or bedside chair)?: Total Help needed to walk in hospital room?: Total Help needed climbing 3-5 steps with a railing? : Total 6 Click Score: 7    End of Session Equipment Utilized During Treatment: Gait belt Activity Tolerance: Patient limited by fatigue;Patient limited by pain Patient left: in chair;with chair alarm set;with call bell/phone within reach Nurse Communication: Mobility status PT Visit Diagnosis: Muscle weakness (generalized) (  M62.81);Difficulty in walking, not elsewhere classified (R26.2);Unsteadiness on feet (R26.81);Pain Pain - Right/Left: Left Pain - part of body: Leg (R leg, L ribs)     Time: 7127-8718 PT Time Calculation (min) (ACUTE ONLY): 25 min  Charges:  $Therapeutic Activity: 23-37 mins                     Lavone Nian, PT, DPT 09/22/22, 3:48 PM   Waunita Schooner 09/22/2022, 3:15 PM

## 2022-09-22 NOTE — Evaluation (Signed)
Occupational Therapy Evaluation Patient Details Name: Pamela Paul MRN: [id number] DOB: Aug 01, 1926 Today's Date: 09/22/2022   History of Present Illness Pt is a 88 y/o F admitted on 09/20/22 after presenting 2/2 falling at home & inability to bear weight. Pt found to have R femur intertrochanteric fx. Pt underwent R IM nailing on 09/21/22.  PMH: hypothyroid, h/o breast CA, CKD   Clinical Impression   Patient presenting with decreased Ind in self care, balance, functional mobility/transfers, endurance, and safety awareness. Patient reports being mod I with rollator and lives in an ALF. Patient currently functioning at max A for bed mobility. Pt with poor sitting balance on EOB requiring min - mod A for static sitting balance. 1 stand from EOB with max A and 1 small side step before B knee buckle and pt returning to bed with total A sit >supine. BP while seated EOB is 115/76 and HR of 87 bpm. Patient will benefit from acute OT to increase overall independence in the areas of ADLs, functional mobility, and safety awareness in order to safely discharge to next venue of care.     Recommendations for follow up therapy are one component of a multi-disciplinary discharge planning process, led by the attending physician.  Recommendations may be updated based on patient status, additional functional criteria and insurance authorization.   Follow Up Recommendations  Skilled nursing-short term rehab (<3 hours/day)    Assistance Recommended at Discharge Intermittent Supervision/Assistance  Patient can return home with the following A lot of help with walking and/or transfers;A lot of help with bathing/dressing/bathroom;Assist for transportation;Direct supervision/assist for financial management;Direct supervision/assist for medications management    Functional Status Assessment  Patient has had a recent decline in their functional status and demonstrates the ability to make significant improvements in  function in a reasonable and predictable amount of time.  Equipment Recommendations  Other (comment) (defer to next venue of care)       Precautions / Restrictions Precautions Precautions: Fall Restrictions Weight Bearing Restrictions: Yes RLE Weight Bearing: Weight bearing as tolerated      Mobility Bed Mobility Overal bed mobility: Needs Assistance Bed Mobility: Supine to Sit, Sit to Supine     Supine to sit: Max assist, HOB elevated Sit to supine: Total assist        Transfers Overall transfer level: Needs assistance Equipment used: Rolling walker (2 wheels) Transfers: Sit to/from Stand Sit to Stand: Max assist, From elevated surface                  Balance Overall balance assessment: Needs assistance Sitting-balance support: Feet supported, Bilateral upper extremity supported Sitting balance-Leahy Scale: Poor Sitting balance - Comments: min assist astatic sitting EOB with BUE support   Standing balance support: During functional activity, Reliant on assistive device for balance, Bilateral upper extremity supported Standing balance-Leahy Scale: Poor                             ADL either performed or assessed with clinical judgement   ADL Overall ADL's : Needs assistance/impaired                                       General ADL Comments: max- total A for LB self care. Unable to transfer secondary to B knee buckle during evaluation in standing.     Vision Patient Visual Report: No  change from baseline              Pertinent Vitals/Pain Pain Assessment Pain Assessment: Faces Faces Pain Scale: Hurts even more Pain Location: R thigh Pain Descriptors / Indicators: Discomfort Pain Intervention(s): Limited activity within patient's tolerance, Monitored during session, Repositioned     Hand Dominance Right   Extremity/Trunk Assessment Upper Extremity Assessment Upper Extremity Assessment: Overall WFL for tasks  assessed;Generalized weakness   Lower Extremity Assessment Lower Extremity Assessment: Generalized weakness       Communication Communication Communication: HOH   Cognition Arousal/Alertness: Awake/alert Behavior During Therapy: WFL for tasks assessed/performed Overall Cognitive Status: Within Functional Limits for tasks assessed                                 General Comments: Pt is AxOx4 with extra time to recall current month & year. Follows commands with extra time.                Home Living Family/patient expects to be discharged to:: Assisted living                             Home Equipment: Rollator (4 wheels)   Additional Comments: 2nd floor apartment with elevator access at [facility]      Prior Functioning/Environment Prior Level of Function : Independent/Modified Independent             Mobility Comments: pt reports mod I with rollator, denies any other fall hx ADLs Comments: Ind with self care. ALF assists with meds and meals. She ambulates to dinning room with rollator        OT Problem List: Decreased strength;Pain;Decreased range of motion;Decreased activity tolerance;Decreased safety awareness;Impaired balance (sitting and/or standing);Decreased knowledge of use of DME or AE;Decreased knowledge of precautions      OT Treatment/Interventions: Self-care/ADL training;Therapeutic exercise;Therapeutic activities;DME and/or AE instruction;Manual therapy;Balance training;Patient/family education;Energy conservation    OT Goals(Current goals can be found in the care plan section) Acute Rehab OT Goals Patient Stated Goal: to get stronger OT Goal Formulation: With patient Time For Goal Achievement: 10/06/22 Potential to Achieve Goals: Fair ADL Goals Pt Will Perform Grooming: standing;with min guard assist Pt Will Perform Lower Body Dressing: with min assist;sit to/from stand Pt Will Transfer to Toilet: with min  assist;ambulating Pt Will Perform Toileting - Clothing Manipulation and hygiene: with min assist;sit to/from stand  OT Frequency: Min 2X/week       AM-PAC OT "6 Clicks" Daily Activity     Outcome Measure Help from another person eating meals?: None Help from another person taking care of personal grooming?: A Little Help from another person toileting, which includes using toliet, bedpan, or urinal?: A Lot Help from another person bathing (including washing, rinsing, drying)?: A Lot Help from another person to put on and taking off regular upper body clothing?: A Little Help from another person to put on and taking off regular lower body clothing?: Total 6 Click Score: 15   End of Session Equipment Utilized During Treatment: Rolling walker (2 wheels) Nurse Communication: Mobility status  Activity Tolerance: Patient tolerated treatment well Patient left: in bed;with call bell/phone within reach;with family/visitor present  OT Visit Diagnosis: Unsteadiness on feet (R26.81);Repeated falls (R29.6);Muscle weakness (generalized) (M62.81);History of falling (Z91.81)                Time: 5462-7035 OT Time Calculation (  min): 22 min Charges:  OT General Charges $OT Visit: 1 Visit OT Evaluation $OT Eval Moderate Complexity: 1 Mod OT Treatments $Therapeutic Activity: 8-22 mins  Darleen Crocker, MS, OTR/L , CBIS ascom [phone]  09/22/22, 2:21 PM

## 2022-09-22 NOTE — Care Management Important Message (Signed)
Important Message  Patient Details  Name: Pamela Paul MRN: [id number] Date of Birth: 1926/05/02   Medicare Important Message Given:  N/A - LOS <3 / Initial given by admissions     [name] 09/22/2022, 9:06 AM

## 2022-09-22 NOTE — Plan of Care (Signed)

## 2022-09-23 ENCOUNTER — Encounter: Payer: Self-pay | Admitting: Orthopedic Surgery

## 2022-09-23 DIAGNOSIS — E871 Hypo-osmolality and hyponatremia: Secondary | ICD-10-CM

## 2022-09-23 DIAGNOSIS — S72144A Nondisplaced intertrochanteric fracture of right femur, initial encounter for closed fracture: Secondary | ICD-10-CM | POA: Diagnosis not present

## 2022-09-23 DIAGNOSIS — E039 Hypothyroidism, unspecified: Secondary | ICD-10-CM | POA: Diagnosis not present

## 2022-09-23 DIAGNOSIS — I10 Essential (primary) hypertension: Secondary | ICD-10-CM | POA: Diagnosis not present

## 2022-09-23 DIAGNOSIS — D62 Acute posthemorrhagic anemia: Secondary | ICD-10-CM | POA: Diagnosis not present

## 2022-09-23 LAB — TYPE AND SCREEN
ABO/RH(D): O POS
Antibody Screen: NEGATIVE
Unit division: 0

## 2022-09-23 LAB — CBC
HCT: 24.4 % — ABNORMAL LOW (ref 36.0–46.0)
Hemoglobin: 8.5 g/dL — ABNORMAL LOW (ref 12.0–15.0)
MCH: 31.8 pg (ref 26.0–34.0)
MCHC: 34.8 g/dL (ref 30.0–36.0)
MCV: 91.4 fL (ref 80.0–100.0)
Platelets: 159 10*3/uL (ref 150–400)
RBC: 2.67 MIL/uL — ABNORMAL LOW (ref 3.87–5.11)
RDW: 12.7 % (ref 11.5–15.5)
WBC: 8.8 10*3/uL (ref 4.0–10.5)
nRBC: 0 % (ref 0.0–0.2)

## 2022-09-23 LAB — BPAM RBC
Blood Product Expiration Date: 202311232359
ISSUE DATE / TIME: 202310200949
Unit Type and Rh: 5100

## 2022-09-23 LAB — BASIC METABOLIC PANEL
Anion gap: 6 (ref 5–15)
BUN: 18 mg/dL (ref 8–23)
CO2: 24 mmol/L (ref 22–32)
Calcium: 7.6 mg/dL — ABNORMAL LOW (ref 8.9–10.3)
Chloride: 96 mmol/L — ABNORMAL LOW (ref 98–111)
Creatinine, Ser: 1.38 mg/dL — ABNORMAL HIGH (ref 0.44–1.00)
GFR, Estimated: 35 mL/min — ABNORMAL LOW (ref >60)
Glucose, Bld: 119 mg/dL — ABNORMAL HIGH (ref 70–99)
Potassium: 3.9 mmol/L (ref 3.5–5.1)
Sodium: 126 mmol/L — ABNORMAL LOW (ref 135–145)

## 2022-09-23 LAB — TSH: TSH: 2.523 u[IU]/mL (ref 0.350–4.500)

## 2022-09-23 LAB — SODIUM, URINE, RANDOM: Sodium, Ur: 22 mmol/L

## 2022-09-23 LAB — OSMOLALITY, URINE: Osmolality, Ur: 152 mOsm/kg — ABNORMAL LOW (ref 300–900)

## 2022-09-23 MED ORDER — SODIUM CHLORIDE 0.9 % IV SOLN: INTRAVENOUS | Status: AC

## 2022-09-23 NOTE — Progress Notes (Signed)
Patient was bladder scanned with 637 in bladder.  Patient was I&O cath by primary nurse with NT present at the bedside.  Patient had a total of 700 from I&O cath.

## 2022-09-23 NOTE — Progress Notes (Signed)
Subjective: 2 Days Post-Op Procedure(s) (LRB): INTRAMEDULLARY (IM) NAIL INTERTROCHANTERIC (Right)    Patient reports pain as mild.  Objective:  The patient is awake and alert and sitting up in a chair.  Her family is at the bedside.  She complains of mild to moderate pain only.  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  Hemoglobin is up to 8.5 today.  VITALS:   Vitals:   09/22/22 2340 09/23/22 0948  BP: (!) 123/45 (!) 125/49  Pulse: 76 74  Resp: 14 16  Temp: 98.1 F (36.7 C)   SpO2: 95% 98%    Neurologically intact Incision: dressing C/D/I Moderate swelling in the right thigh with minimal ecchymosis.  No evidence of a compartment syndrome.  LABS Recent Labs    09/20/22 1807 09/22/22 0314 09/23/22 0445  HGB 11.8* 7.2* 8.5*  HCT 35.4* 21.0* 24.4*  WBC 8.5 9.8 8.8  PLT 247 166 159    Recent Labs    09/21/22 2012 09/22/22 0314 09/23/22 0445  NA 130* 128* 126*  K 4.3 4.2 3.9  BUN '22 21 18  '$ CREATININE 1.55* 1.44* 1.38*  GLUCOSE 149* 114* 119*    No results for input(s): "LABPT", "INR" in the last 72 hours.   Assessment/Plan: 2 Days Post-Op Procedure(s) (LRB): INTRAMEDULLARY (IM) NAIL INTERTROCHANTERIC (Right)   Advance diet Up with therapy Discharge to SNF when medically stable. Partial weightbearing right leg with walker.

## 2022-09-23 NOTE — Plan of Care (Signed)

## 2022-09-23 NOTE — Progress Notes (Signed)
Physical Therapy Treatment Patient Details Name: Pamela Paul MRN: [id number] DOB: [birth year] Today's Date: 09/23/2022   History of Present Illness Pt is a 88 y/o F admitted on 09/20/22 after presenting 2/2 falling at home & inability to bear weight. Pt found to have R femur intertrochanteric fx. Pt underwent R IM nailing on 09/21/22.  PMH: hypothyroid, h/o breast CA, CKD    PT Comments    Pt was still in recliner since AM session and had been checked on several time throughout the day by author. Pt is much more fatigued this afternoon versus AM session. She does however remain extremely cooperative and pleasant. Due to increased pain, elected to squat pivot pt back to EOB with +2 assist for safety. Once repositioned in bed, RN entered room and performed several RN task. Family present and remain extremely helpful and pleasant. Reviewed exercises and POC going forward. SNF at DC remains most appropriate.     Recommendations for follow up therapy are one component of a multi-disciplinary discharge planning process, led by the attending physician.  Recommendations may be updated based on patient status, additional functional criteria and insurance authorization.  Follow Up Recommendations  Skilled nursing-short term rehab (<3 hours/day) Can patient physically be transported by private vehicle: No   Assistance Recommended at Discharge Frequent or constant Supervision/Assistance  Patient can return home with the following Two people to help with walking and/or transfers;Two people to help with bathing/dressing/bathroom;Assist for transportation;Assistance with cooking/housework   Equipment Recommendations  Other (comment) (defer to next level of care)       Precautions / Restrictions Precautions Precautions: Fall Precaution Comments: watch HR Restrictions Weight Bearing Restrictions: Yes RLE Weight Bearing: Weight bearing as tolerated     Mobility  Bed Mobility Overal bed mobility:  Needs Assistance Bed Mobility: Sit to Supine  Supine to sit: Max assist, HOB elevated Sit to supine: Max assist, Total assist   General bed mobility comments: Pt required +2 assist to return to suipine from EOB this afternoon    Transfers Overall transfer level: Needs assistance Equipment used: None Transfers: Bed to chair/wheelchair/BSC Sit to Stand: Max assist, From elevated surface   Lateral/Scoot Transfers: Max assist, +2 safety/equipment General transfer comment: due to pt's increased pain, performed squat pivot back into bed. Pt much more fatigud this afternoon + increased overall pain noted    Ambulation/Gait    General Gait Details: unable    Balance Overall balance assessment: Needs assistance Sitting-balance support: Bilateral upper extremity supported, Feet supported Sitting balance-Leahy Scale: Fair Sitting balance - Comments: pt was able to sit EOB with CGA-SBA lateral L lean due to pain at times   Standing balance support: During functional activity, Reliant on assistive device for balance, Bilateral upper extremity supported Standing balance-Leahy Scale: Poor Standing balance comment: max assist in standing         Cognition Arousal/Alertness: Awake/alert Behavior During Therapy: WFL for tasks assessed/performed Overall Cognitive Status: Within Functional Limits for tasks assessed        General Comments: Pt remains alert however little cognition deficits observed in PM later session. still able to follwo command consistently. Pt presented with muc more pain this afternoon           General Comments General comments (skin integrity, edema, etc.): Pt has been performing ther ex throughout the day with family. too fatigued this afternoon to perform during this session. RN in room and assisted with rolling to insert suposittory      Pertinent Vitals/Pain Pain  Assessment Pain Assessment: 0-10 Pain Score: 10-Worst pain ever Pain Location: L ribs, R  thigh Pain Descriptors / Indicators: Discomfort, Guarding, Spasm Pain Intervention(s): Limited activity within patient's tolerance, Monitored during session, Premedicated before session, Repositioned, Patient requesting pain meds-RN notified     PT Goals (current goals can now be found in the care plan section) Acute Rehab PT Goals Patient Stated Goal: get better with less pain Progress towards PT goals: Not progressing toward goals - comment (pain limited)    Frequency    BID      PT Plan Current plan remains appropriate       AM-PAC PT "6 Clicks" Mobility   Outcome Measure  Help needed turning from your back to your side while in a flat bed without using bedrails?: A Lot Help needed moving from lying on your back to sitting on the side of a flat bed without using bedrails?: A Lot Help needed moving to and from a bed to a chair (including a wheelchair)?: Total Help needed standing up from a chair using your arms (e.g., wheelchair or bedside chair)?: Total Help needed to walk in hospital room?: Total Help needed climbing 3-5 steps with a railing? : Total 6 Click Score: 8    End of Session Equipment Utilized During Treatment: Gait belt Activity Tolerance: Patient limited by pain;Patient limited by fatigue Patient left: in bed;with call bell/phone within reach;with bed alarm set;with nursing/sitter in room;with family/visitor present Nurse Communication: Mobility status PT Visit Diagnosis: Muscle weakness (generalized) (M62.81);Difficulty in walking, not elsewhere classified (R26.2);Unsteadiness on feet (R26.81);Pain Pain - Right/Left: Left Pain - part of body: Leg     Time: 3235-5732 PT Time Calculation (min) (ACUTE ONLY): 13 min  Charges:  $Therapeutic Exercise: 8-22 mins $Therapeutic Activity: 8-22 mins                     Julaine Fusi PTA 09/23/22, 5:30 PM

## 2022-09-23 NOTE — Progress Notes (Signed)
Physical Therapy Treatment Patient Details Name: Pamela Paul MRN: [id number] DOB: [birth year] Today's Date: 09/23/2022   History of Present Illness Pt is a 88 y/o F admitted on 09/20/22 after presenting 2/2 falling at home & inability to bear weight. Pt found to have R femur intertrochanteric fx. Pt underwent R IM nailing on 09/21/22.  PMH: hypothyroid, h/o breast CA, CKD    PT Comments    Pt was long sitting in room with supportive son (POA) and daughter in law present. Pt is A and agreeable to session. Continues to endorse severe pain with movement but was motivated to get OOB and perform exercises. Pt requires extensive assistance for all mobility and transfer to recliner. HR quickly elevated into 160s during transfer to recliner with prolong recovery time required. RN/MD made aware. Discussed code status with pt's son + patient. Listed incorrectly in chart. Relayed to RN+ MD. During session, author issued HEP handout and discussed recommendation for performance. Family states they will assist pt with completion throughout the day. SNF at [place] remains most appropriate.     Recommendations for follow up therapy are one component of a multi-disciplinary discharge planning process, led by the attending physician.  Recommendations may be updated based on patient status, additional functional criteria and insurance authorization.  Follow Up Recommendations  Skilled nursing-short term rehab (<3 hours/day) (at [facility]) Can patient physically be transported by private vehicle: No   Assistance Recommended at Discharge Frequent or constant Supervision/Assistance  Patient can return home with the following Two people to help with walking and/or transfers;Two people to help with bathing/dressing/bathroom;Assist for transportation;Assistance with cooking/housework   Equipment Recommendations  None recommended by PT       Precautions / Restrictions Precautions Precautions: Fall Precaution  Comments:  (Watch HR!) Restrictions Weight Bearing Restrictions: Yes RLE Weight Bearing: Weight bearing as tolerated     Mobility  Bed Mobility Overal bed mobility: Needs Assistance Bed Mobility: Supine to Sit     Supine to sit: Max assist, HOB elevated Sit to supine: Max assist   General bed mobility comments: increased time + max assist    Transfers Overall transfer level: Needs assistance Equipment used: Rolling walker (2 wheels) Transfers: Sit to/from Stand Sit to Stand: Max assist, From elevated surface           General transfer comment: Pt stood 2 x EOB. First STS x ~ 20 sec.2nd STS was able to pivot to recliner however unable to clear feet to take steps. HR elevated to 160s and pt heart monitor readin Vtach. prolonged recovery time with cold wash cloth to aid is recovery. RN/MD made aware. Discussed code status listed in chart and pt's son(POA) reports she is DNR. Also relayed to RN/MD. updated in chart    Ambulation/Gait   General Gait Details: unable   Balance Overall balance assessment: Needs assistance Sitting-balance support: Feet supported, Bilateral upper extremity supported Sitting balance-Leahy Scale: Fair Sitting balance - Comments: pt was able to sit EOB with CGA-SBA lateral L lean due to pain at times   Standing balance support: During functional activity, Reliant on assistive device for balance, Bilateral upper extremity supported Standing balance-Leahy Scale: Poor Standing balance comment: max assist in standing       Cognition Arousal/Alertness: Awake/alert Behavior During Therapy: WFL for tasks assessed/performed Overall Cognitive Status: Within Functional Limits for tasks assessed      General Comments: Pt is AxOx4 very pleasant and cooperative.  Pertinent Vitals/Pain Pain Assessment Pain Assessment: 0-10 Pain Score: 4  Pain Location: L ribs, R thigh Pain Descriptors / Indicators: Discomfort, Guarding,  Grimacing Pain Intervention(s): Limited activity within patient's tolerance, Monitored during session, Premedicated before session, Repositioned     PT Goals (current goals can now be found in the care plan section) Acute Rehab PT Goals Patient Stated Goal: get better, go to rehab back at [facility] Progress towards PT goals: Not progressing toward goals - comment (pain limited/ cardiac concerns- HR elevated quickly with minimal standing activity)    Frequency    BID      PT Plan Current plan remains appropriate       AM-PAC PT "6 Clicks" Mobility   Outcome Measure  Help needed turning from your back to your side while in a flat bed without using bedrails?: A Lot Help needed moving from lying on your back to sitting on the side of a flat bed without using bedrails?: A Lot Help needed moving to and from a bed to a chair (including a wheelchair)?: A Lot Help needed standing up from a chair using your arms (e.g., wheelchair or bedside chair)?: A Lot Help needed to walk in hospital room?: Total Help needed climbing 3-5 steps with a railing? : Total 6 Click Score: 10    End of Session Equipment Utilized During Treatment: Gait belt Activity Tolerance: Patient limited by fatigue;Patient limited by pain Patient left: in chair;with chair alarm set;with call bell/phone within reach Nurse Communication: Mobility status PT Visit Diagnosis: Muscle weakness (generalized) (M62.81);Difficulty in walking, not elsewhere classified (R26.2);Unsteadiness on feet (R26.81);Pain Pain - Right/Left: Left Pain - part of body: Leg     Time: 0712-1975 PT Time Calculation (min) (ACUTE ONLY): 29 min  Charges:  $Therapeutic Exercise: 8-22 mins $Therapeutic Activity: 8-22 mins                    Julaine Fusi PTA 09/23/22, 4:02 PM

## 2022-09-23 NOTE — Progress Notes (Signed)
PROGRESS NOTE    Pamela Paul   IWP:[id number] DOB: January 25, 1926  DOA: 09/20/2022 Date of Service: 09/23/22 PCP: Dewayne Shorter, MD     Brief Narrative / Hospital Course:  Pamela Paul, a 88 y/o with hypothyroidism, h/o breast cancer, CKD, had a mechanical fall at home with immediate pain and inability to bear weight. She presents to [facility] for evaluation 09/20/22.   10/18-10/19: intertrochanteric fracture of right femur, orthopedics consulted, intramedullary right nail femur done 09/21/22. Plan SNF placement.  Overnight 10/19 respiratory distress after receiving fentanyl for treatment of her hip pain, requiring narcan x2 doses which resulted in improvement. CTA r/o PE. Troponin not elevated. Remains on telemetry.  10/20: symptomatic postop anemia, received 1 unit PRBC. Await SNF placement. Lovenox while at skilled nursing facility.  Patient will follow-up with Dr Cindi Carbon in the office in 10 to 14 days for wound check and staple removal. 10/21: sodium trending down --> hypotonic, osm and lytes c/w hypovolemia, started IV fluids 1L. Retaining urine requiring in and out cath. Minimal mobility, pt encouraged OOB to commode.   Consultants:  Orthopedics   Procedures: Intramedullary nail right femur 09/21/2022   Wt Readings from Last 3 Encounters:  09/21/22 83.9 kg  07/28/22 82.6 kg  04/27/22 85.2 kg      ASSESSMENT & PLAN:   Principal Problem:   Intertrochanteric fracture of right femur (HCC) Active Problems:   Hypothyroidism   Hypertension   Hyponatremia   Acute postoperative anemia due to expected blood loss   Intertrochanteric fracture of right femur (HCC) Low/moderate risk for low risk surgery Orthopedics service following   Hypothyroidism TSH 08/17/21 with good control. TSH pending Continue present dose of synthroid - adjust dose as needed based on lab  Postoperative anemia Hgb 7.2 --(S/p 1 unit PRBC per surgery team)--> 8.5 Monitor CBC  Hypotonic  Hypovolemic Hyponatremia  1L NS Recheck labs in AM  Hypertension BP controlled / low  Held home medications  CKD 3a/b Trend BMP   DVT prophylaxis: lovenox - low threshold to hold this if bleeding  Pertinent IV fluids/nutrition: cleared for diet by SLP. Getting NS for hyponatremia.  Central lines / invasive devices: none  Code Status: FULL CODE Family Communication: family at bedside on rounds   Disposition: inpatient  TOC needs: need SNF for rehab Barriers to discharge / significant pending items: SNF auth pending for rehab post op, hyponatremia evaluation              Subjective:  Patient reports feeling better today, abdominal bloating and no BM but passing flatus, not wanting to eat or move much.        Objective:  Vitals:   09/22/22 1315 09/22/22 1639 09/22/22 2340 09/23/22 0948  BP: (!) 101/53 138/65 (!) 123/45 (!) 125/49  Pulse: 81 83 76 74  Resp: '16 17 14 16  '$ Temp: 98 F (36.7 C) 97.9 F (36.6 C) 98.1 F (36.7 C)   TempSrc: Oral     SpO2:  99% 95% 98%  Weight:      Height:        Intake/Output Summary (Last 24 hours) at 09/23/2022 1254 Last data filed at 09/23/2022 1030 Gross per 24 hour  Intake 840 ml  Output 2150 ml  Net -1310 ml   Filed Weights   09/20/22 1805 09/21/22 0514  Weight: 83.9 kg 83.9 kg    Examination: Constitutional:  VS as above General Appearance: alert, well-developed, well-nourished, NAD Respiratory: Normal respiratory effort No wheeze No rhonchi No rales  Cardiovascular: S1/S2 normal No murmur No rub/gallop auscultated No lower extremity edema Gastrointestinal: No tenderness Mild distension no rebound/guarding (+)hyperactive bowel sounds  Musculoskeletal:  No clubbing/cyanosis of digits Symmetrical movement in all extremities Neurological: No cranial nerve deficit on limited exam Alert Psychiatric: Normal judgment/insight Normal mood and affect       Scheduled Medications:   docusate  sodium  100 mg Oral BID   enoxaparin (LOVENOX) injection  30 mg Subcutaneous Q24H   levothyroxine  50 mcg Oral Q0600   pantoprazole (PROTONIX) IV  40 mg Intravenous Q24H   senna  1 tablet Oral BID    Continuous Infusions:  sodium chloride     methocarbamol (ROBAXIN) IV      PRN Medications:  acetaminophen, alum & mag hydroxide-simeth, bisacodyl, HYDROcodone-acetaminophen, methocarbamol **OR** methocarbamol (ROBAXIN) IV, morphine injection, naLOXone (NARCAN)  injection, ondansetron **OR** ondansetron (ZOFRAN) IV, polyethylene glycol  Antimicrobials:  Anti-infectives (From admission, onward)    Start     Dose/Rate Route Frequency Ordered Stop   09/21/22 1900  ceFAZolin (ANCEF) IVPB 2g/100 mL premix        2 g 200 mL/hr over 30 Minutes Intravenous Every 6 hours 09/21/22 1641 09/22/22 0838   09/21/22 1131  ceFAZolin (ANCEF) 2-4 GM/100ML-% IVPB       Note to Pharmacy: Herby Abraham W: cabinet override      09/21/22 1131 09/22/22 0838   09/21/22 0600  ceFAZolin (ANCEF) IVPB 2g/100 mL premix        2 g 200 mL/hr over 30 Minutes Intravenous On call to O.R. 09/20/22 2229 09/22/22 0559       Data Reviewed: I have personally reviewed following labs and imaging studies  CBC: Recent Labs  Lab 09/20/22 1807 09/22/22 0314 09/23/22 0445  WBC 8.5 9.8 8.8  NEUTROABS 5.6  --   --   HGB 11.8* 7.2* 8.5*  HCT 35.4* 21.0* 24.4*  MCV 95.7 93.8 91.4  PLT 247 166 678   Basic Metabolic Panel: Recent Labs  Lab 09/20/22 1807 09/21/22 2012 09/21/22 2149 09/22/22 0314 09/23/22 0445  NA 133* 130*  --  128* 126*  K 3.8 4.3  --  4.2 3.9  CL 98 96*  --  96* 96*  CO2 26 23  --  25 24  GLUCOSE 117* 149*  --  114* 119*  BUN 22 22  --  21 18  CREATININE 1.67* 1.55*  --  1.44* 1.38*  CALCIUM 9.3 8.2*  --  7.4* 7.6*  MG  --   --  1.6*  --   --   PHOS  --   --  2.9  --   --    GFR: Estimated Creatinine Clearance: 26.7 mL/min (A) (by C-G formula based on SCr of 1.38 mg/dL (H)). Liver  Function Tests: No results for input(s): "AST", "ALT", "ALKPHOS", "BILITOT", "PROT", "ALBUMIN" in the last 168 hours. No results for input(s): "LIPASE", "AMYLASE" in the last 168 hours. No results for input(s): "AMMONIA" in the last 168 hours. Coagulation Profile: No results for input(s): "INR", "PROTIME" in the last 168 hours. Cardiac Enzymes: No results for input(s): "CKTOTAL", "CKMB", "CKMBINDEX", "TROPONINI" in the last 168 hours. BNP (last 3 results) No results for input(s): "PROBNP" in the last 8760 hours. HbA1C: No results for input(s): "HGBA1C" in the last 72 hours. CBG: Recent Labs  Lab 09/21/22 1934  GLUCAP 135*   Lipid Profile: No results for input(s): "CHOL", "HDL", "LDLCALC", "TRIG", "CHOLHDL", "LDLDIRECT" in the last 72 hours. Thyroid Function Tests:  No results for input(s): "TSH", "T4TOTAL", "FREET4", "T3FREE", "THYROIDAB" in the last 72 hours. Anemia Panel: No results for input(s): "VITAMINB12", "FOLATE", "FERRITIN", "TIBC", "IRON", "RETICCTPCT" in the last 72 hours. Urine analysis:    Component Value Date/Time   BILIRUBINUR small 12/28/2014 0956   PROTEINUR 30 12/28/2014 0956   UROBILINOGEN 0.2 12/28/2014 0956   NITRITE neg 12/28/2014 0956   LEUKOCYTESUR large (3+) 12/28/2014 0956   Sepsis Labs: '@LABRCNTIP'$ (procalcitonin:4,lacticidven:4)  No results found for this or any previous visit (from the past 240 hour(s)).       Radiology Studies: CT Angio Chest Pulmonary Embolism (PE) W or WO Contrast  Result Date: 09/21/2022 CLINICAL DATA:  Concern for pulmonary embolism. EXAM: CT ANGIOGRAPHY CHEST WITH CONTRAST TECHNIQUE: Multidetector CT imaging of the chest was performed using the standard protocol during bolus administration of intravenous contrast. Multiplanar CT image reconstructions and MIPs were obtained to evaluate the vascular anatomy. RADIATION DOSE REDUCTION: This exam was performed according to the departmental dose-optimization program which  includes automated exposure control, adjustment of the mA and/or kV according to patient size and/or use of iterative reconstruction technique. CONTRAST:  74m OMNIPAQUE IOHEXOL 350 MG/ML SOLN COMPARISON:  Chest radiograph dated 09/21/2022. FINDINGS: Evaluation of this exam is limited due to respiratory motion artifact. Cardiovascular: There is no cardiomegaly or pericardial effusion. Three-vessel coronary vascular calcification. Mild atherosclerotic calcification of the thoracic aorta. No aneurysmal dilatation or dissection. Evaluation of the pulmonary arteries is limited due to respiratory motion. No pulmonary artery embolus identified. Mediastinum/Nodes: No hilar or mediastinal adenopathy. The esophagus is grossly unremarkable. There is a 12 mm left thyroid hypodense nodule. In the setting of significant comorbidities or limited life expectancy, no follow-up recommended (ref: J Am Coll Radiol. 2015 Feb;12(2): 143-50).No mediastinal fluid collection. Lungs/Pleura: Bibasilar subpleural scarring. No focal consolidation, pleural effusion, or pneumothorax. There is a 5 mm subpleural nodule in the superior segment of the left lower lobe (44/6). The central airways are patent. Upper Abdomen: Cholecystectomy. Fatty liver. Partially visualized 4 cm left renal cyst. Musculoskeletal: Osteopenia degenerative changes of the spine. Age indeterminate, likely chronic compression fracture of superior endplate of T3 with approximately 30% loss of vertebral body height and anterior wedging. Correlation with clinical exam and point tenderness recommended. No definite acute osseous pathology. Review of the MIP images confirms the above findings. IMPRESSION: 1. No acute intrathoracic pathology. No CT evidence of pulmonary artery embolus. 2. A 5 mm subpleural nodule in the superior segment of the left lower lobe. No follow-up needed if patient is low-risk.This recommendation follows the consensus statement: Guidelines for Management of  Incidental Pulmonary Nodules Detected on CT Images: From the Fleischner Society 2017; Radiology 2017; 284:228-243. 3. Age indeterminate, likely chronic compression fracture of superior endplate of T3 with approximately 30% loss of vertebral body height and anterior wedging. Correlation with clinical exam and point tenderness recommended. 4. Fatty liver. 5.  Aortic Atherosclerosis (ICD10-I70.0). Electronically Signed   By: AAnner CreteM.D.   On: 09/21/2022 22:43   DG Chest Port 1 View  Result Date: 09/21/2022 CLINICAL DATA:  Dyspnea EXAM: PORTABLE CHEST 1 VIEW COMPARISON:  Chest 09/20/2022 FINDINGS: The heart size and mediastinal contours are within normal limits. Both lungs are clear. The visualized skeletal structures are unremarkable. IMPRESSION: No active disease. Electronically Signed   By: CFranchot GalloM.D.   On: 09/21/2022 19:57   DG FEMUR, MIN 2 VIEWS RIGHT  Result Date: 09/21/2022 CLINICAL DATA:  Right hip fracture fixation. EXAM: RIGHT FEMUR 2 VIEWS COMPARISON:  Radiographs from yesterday. FINDINGS: There is a long intramedullary rod in the femur with a proximal dynamic hip screw transfixing the intertrochanteric fracture with anatomic reduction. Two distal interlocking screws are also noted. IMPRESSION: Internal fixation of intertrochanteric right hip fracture with anatomic reduction. Electronically Signed   By: Marijo Sanes M.D.   On: 09/21/2022 15:39   DG HIP UNILAT WITH PELVIS 2-3 VIEWS RIGHT  Result Date: 09/21/2022 CLINICAL DATA:  Elective surgery.  Right femoral intramedullary nail EXAM: DG HIP (WITH OR WITHOUT PELVIS) 2-3V RIGHT COMPARISON:  Right hip radiographs 09/20/2022 FINDINGS: Images were performed intraoperatively without the presence of a radiologist. The patient is undergoing long cephalomedullary nail fixation of the right femur. The patient is seen intertrochanteric fracture is now normally aligned. Total fluoroscopy images: 4 Total fluoroscopy time: 100 seconds  Total dose: Radiation Exposure Index (as provided by the fluoroscopic device): 20.92 mGy air Kerma Please see intraoperative findings for further detail. IMPRESSION: Intraoperative fluoroscopic guidance for long cephalomedullary nail fixation of the right femur. Electronically Signed   By: Yvonne Kendall M.D.   On: 09/21/2022 14:41   DG C-Arm 1-60 Min-No Report  Result Date: 09/21/2022 Fluoroscopy was utilized by the requesting physician.  No radiographic interpretation.   DG Chest 1 View  Result Date: 09/20/2022 CLINICAL DATA:  Fall EXAM: CHEST  1 VIEW COMPARISON:  09/27/2020 FINDINGS: Low lung volumes. No acute airspace disease or effusion. Upper normal cardiomediastinal silhouette augmented by low lung volume. No pneumothorax IMPRESSION: No active disease.  Low lung volume Electronically Signed   By: Donavan Foil M.D.   On: 09/20/2022 18:42   DG Hip Unilat W or Wo Pelvis 2-3 Views Right  Result Date: 09/20/2022 CLINICAL DATA:  Hip pain with fall EXAM: DG HIP (WITH OR WITHOUT PELVIS) 2-3V RIGHT COMPARISON:  None Available. FINDINGS: Hardware in the lower lumbar spine. Pubic symphysis and rami appear intact. Multiple clips in the pelvis. Acute comminuted intertrochanteric fracture with displacement. No femoral head dislocation IMPRESSION: Acute comminuted and displaced right intertrochanteric fracture Electronically Signed   By: Donavan Foil M.D.   On: 09/20/2022 18:41            LOS: 2 days       Emeterio Reeve, DO [facility] 09/23/2022, 12:54 PM   Staff may message me via secure chat in [place]  but this may not receive immediate response,  please page for urgent matters!  If 7PM-7AM, please contact night-coverage [email or web address]  Dictation software was used to generate the above note. Typos may occur and escape review, as with typed/written notes. Please contact Dr Sheppard Coil directly for clarity if needed.

## 2022-09-24 DIAGNOSIS — E039 Hypothyroidism, unspecified: Secondary | ICD-10-CM | POA: Diagnosis not present

## 2022-09-24 DIAGNOSIS — S72144A Nondisplaced intertrochanteric fracture of right femur, initial encounter for closed fracture: Secondary | ICD-10-CM | POA: Diagnosis not present

## 2022-09-24 DIAGNOSIS — I10 Essential (primary) hypertension: Secondary | ICD-10-CM | POA: Diagnosis not present

## 2022-09-24 DIAGNOSIS — D62 Acute posthemorrhagic anemia: Secondary | ICD-10-CM | POA: Diagnosis not present

## 2022-09-24 LAB — BASIC METABOLIC PANEL
Anion gap: 3 — ABNORMAL LOW (ref 5–15)
BUN: 16 mg/dL (ref 8–23)
CO2: 24 mmol/L (ref 22–32)
Calcium: 7.8 mg/dL — ABNORMAL LOW (ref 8.9–10.3)
Chloride: 105 mmol/L (ref 98–111)
Creatinine, Ser: 1.26 mg/dL — ABNORMAL HIGH (ref 0.44–1.00)
GFR, Estimated: 40 mL/min — ABNORMAL LOW (ref >60)
Glucose, Bld: 112 mg/dL — ABNORMAL HIGH (ref 70–99)
Potassium: 4.2 mmol/L (ref 3.5–5.1)
Sodium: 132 mmol/L — ABNORMAL LOW (ref 135–145)

## 2022-09-24 LAB — CBC
HCT: 23.9 % — ABNORMAL LOW (ref 36.0–46.0)
Hemoglobin: 8.1 g/dL — ABNORMAL LOW (ref 12.0–15.0)
MCH: 31.3 pg (ref 26.0–34.0)
MCHC: 33.9 g/dL (ref 30.0–36.0)
MCV: 92.3 fL (ref 80.0–100.0)
Platelets: 187 10*3/uL (ref 150–400)
RBC: 2.59 MIL/uL — ABNORMAL LOW (ref 3.87–5.11)
RDW: 12.9 % (ref 11.5–15.5)
WBC: 8.5 10*3/uL (ref 4.0–10.5)
nRBC: 0 % (ref 0.0–0.2)

## 2022-09-24 MED ORDER — DOCUSATE SODIUM 100 MG PO CAPS: 100.0000 mg | ORAL_CAPSULE | Freq: Two times a day (BID) | ORAL | 0 refills | Status: DC

## 2022-09-24 MED ORDER — METHOCARBAMOL 500 MG PO TABS: 500.0000 mg | ORAL_TABLET | Freq: Four times a day (QID) | ORAL | 0 refills | Status: DC | PRN

## 2022-09-24 MED ORDER — ONDANSETRON HCL 4 MG PO TABS: 4.0000 mg | ORAL_TABLET | Freq: Four times a day (QID) | ORAL | 0 refills | Status: DC | PRN

## 2022-09-24 MED ORDER — OXYCODONE HCL 5 MG PO TABS: 5.0000 mg | ORAL_TABLET | Freq: Four times a day (QID) | ORAL | 0 refills | Status: DC | PRN

## 2022-09-24 MED ORDER — ALUM & MAG HYDROXIDE-SIMETH 200-200-20 MG/5ML PO SUSP: 30.0000 mL | ORAL | 0 refills | Status: DC | PRN

## 2022-09-24 MED ORDER — BISACODYL 10 MG RE SUPP: 10.0000 mg | Freq: Every day | RECTAL | 0 refills | Status: DC | PRN

## 2022-09-24 MED ORDER — ENOXAPARIN SODIUM 30 MG/0.3ML IJ SOSY: 30.0000 mg | PREFILLED_SYRINGE | INTRAMUSCULAR | 0 refills | Status: DC

## 2022-09-24 MED ORDER — POLYETHYLENE GLYCOL 3350 17 G PO PACK: 17.0000 g | PACK | Freq: Every day | ORAL | 0 refills | Status: DC | PRN

## 2022-09-24 MED ORDER — ACETAMINOPHEN 325 MG PO TABS: 325.0000 mg | ORAL_TABLET | Freq: Four times a day (QID) | ORAL | Status: DC | PRN

## 2022-09-24 NOTE — Progress Notes (Signed)
Subjective: 3 Days Post-Op Procedure(s) (LRB): INTRAMEDULLARY (IM) NAIL INTERTROCHANTERIC (Right)   Patient is out of bed in the chair.  Her family is at the bedside.  She is alert and cooperative and feels well.  She has not made significant strides in walking yet.  She is unwilling to actively move the hip and knee much.  Hemoglobin is 8.1 today.  Dressings are dry and there is some minimal to moderate swelling in the thigh.  Patient reports pain as mild.  Objective:   VITALS:   Vitals:   09/24/22 0003 09/24/22 0902  BP: (!) 139/59 (!) 144/64  Pulse: 91 92  Resp: 17   Temp: 98.7 F (37.1 C) 98.2 F (36.8 C)  SpO2: 97% 97%    Neurologically intact Incision: dressing C/D/I  LABS Recent Labs    09/22/22 0314 09/23/22 0445 09/24/22 0526  HGB 7.2* 8.5* 8.1*  HCT 21.0* 24.4* 23.9*  WBC 9.8 8.8 8.5  PLT 166 159 187    Recent Labs    09/22/22 0314 09/23/22 0445 09/24/22 0526  NA 128* 126* 132*  K 4.2 3.9 4.2  BUN '21 18 16  '$ CREATININE 1.44* 1.38* 1.26*  GLUCOSE 114* 119* 112*    No results for input(s): "LABPT", "INR" in the last 72 hours.   Assessment/Plan: 3 Days Post-Op Procedure(s) (LRB): INTRAMEDULLARY (IM) NAIL INTERTROCHANTERIC (Right)   Advance diet Up with therapy Discharge to SNF when medically stable.

## 2022-09-24 NOTE — Progress Notes (Signed)
PROGRESS NOTE    Pamela Paul   ZTI:[id number] DOB: [birth year]  DOA: 09/20/2022 Date of Service: 09/24/22 PCP: Pamela Paul     Brief Narrative / Hospital Course:  [name], a 88 y/o with hypothyroidism, h/o breast cancer, CKD, had a mechanical fall at home with immediate pain and inability to bear weight. She presents to [facility] for evaluation 09/20/22.   10/18-10/19: intertrochanteric fracture of right femur, orthopedics consulted, intramedullary right nail femur done 09/21/22. Plan SNF placement.  Overnight 10/19 respiratory distress after receiving fentanyl for treatment of her hip pain, requiring narcan x2 doses which resulted in improvement. CTA r/o PE. Troponin not elevated. Remains on telemetry.  10/20: symptomatic postop anemia, received 1 unit PRBC. Await SNF placement. Lovenox while at skilled nursing facility.  Patient will follow-up with Dr Pamela Paul in the office in 10 to 14 days for wound check and staple removal. 10/21: sodium trending down --> hypotonic, osm and lytes c/w hypovolemia, started IV fluids 1L. Retaining urine requiring in and out cath multiple times, placed Foley. Minimal mobility, pt encouraged OOB to commode.  10/22: Foley remains in place, pain controlled, no other concerns.  Consultants:  Orthopedics   Procedures: Intramedullary nail right femur 09/21/2022    ASSESSMENT & PLAN:   Principal Problem:   Intertrochanteric fracture of right femur (HCC) Active Problems:   Hypothyroidism   Hypertension   Hyponatremia   Acute postoperative anemia due to expected blood loss   Intertrochanteric fracture of right femur (HCC) Low/moderate risk for low risk surgery Orthopedics service following SNF placement for rehab   Hypothyroidism TSH 08/17/21 with good control. TSH pending --> WNL Continue present dose of synthroid   Postoperative anemia Hgb 7.2 --(S/p 1 unit PRBC per surgery team)--> 8.5 Monitor CBC  Hypotonic Hypovolemic  Hyponatremia -improving Recheck labs post 1L NS --> 10/22 improved  Continue to monitor BMP  Hypertension BP controlled / low  Held home medications  AKI on CKD 3a/b -improving/baseline Trend BMP  Urinary retention Foley cath placed Trial remove and spontaneous voiding prior to discharge, or option to leave Foley in place to SNF   DVT prophylaxis: lovenox - low threshold to hold this if bleeding but given DVT risk postop will continue for now and plan to discharge on Lovenox as well Pertinent IV fluids/nutrition: cleared for diet by SLP. Getting NS for hyponatremia.  Central lines / invasive devices: Foley cath  Code Status: FULL CODE Family Communication: family at bedside on rounds family is very involved  Disposition: inpatient  TOC needs: need SNF for rehab Barriers to discharge / significant pending items: SNF auth pending for rehab post op, anticipate stable for discharge likely 09/25/2022             Subjective:  Patient reports feeling better today, abdominal bloating better, had BM yesterday.  Pain is controlled, no chest pain/pressure, no SOB.       Objective:  Vitals:   09/23/22 1753 09/23/22 2013 09/24/22 0003 09/24/22 0902  BP: (!) 140/75 124/62 (!) 139/59 (!) 144/64  Pulse: (!) 107 95 91 92  Resp:  16 17   Temp:  98.8 F (37.1 C) 98.7 F (37.1 C) 98.2 F (36.8 C)  TempSrc:      SpO2: 99% 97% 97% 97%  Weight:      Height:        Intake/Output Summary (Last 24 hours) at 09/24/2022 1313 Last data filed at 09/24/2022 1036 Gross per 24 hour  Intake 396.72 ml  Output  3375 ml  Net -2978.28 ml   Filed Weights   09/20/22 1805 09/21/22 0514  Weight: 83.9 kg 83.9 kg    Examination: Constitutional:  VS as above General Appearance: alert, well-developed, well-nourished, NAD Respiratory: Normal respiratory effort No wheeze No rhonchi No rales Cardiovascular: S1/S2 normal No murmur No rub/gallop auscultated No lower extremity  edema Gastrointestinal: No tenderness Musculoskeletal:  No clubbing/cyanosis of digits Symmetrical movement in all extremities Neurological: No cranial nerve deficit on limited exam Alert Psychiatric: Normal judgment/insight Normal mood and affect       Scheduled Medications:   docusate sodium  100 mg Oral BID   enoxaparin (LOVENOX) injection  30 mg Subcutaneous Q24H   levothyroxine  50 mcg Oral Q0600   pantoprazole (PROTONIX) IV  40 mg Intravenous Q24H   senna  1 tablet Oral BID    Continuous Infusions:  methocarbamol (ROBAXIN) IV      PRN Medications:  acetaminophen, alum & mag hydroxide-simeth, bisacodyl, HYDROcodone-acetaminophen, methocarbamol **OR** methocarbamol (ROBAXIN) IV, morphine injection, naLOXone (NARCAN)  injection, ondansetron **OR** ondansetron (ZOFRAN) IV, polyethylene glycol  Antimicrobials:  Anti-infectives (From admission, onward)    Start     Dose/Rate Route Frequency Ordered Stop   09/21/22 1900  ceFAZolin (ANCEF) IVPB 2g/100 mL premix        2 g 200 mL/hr over 30 Minutes Intravenous Every 6 hours 09/21/22 1641 09/22/22 0838   09/21/22 1131  ceFAZolin (ANCEF) 2-4 GM/100ML-% IVPB       Note to Pharmacy: Herby Abraham W: cabinet override      09/21/22 1131 09/22/22 0838   09/21/22 0600  ceFAZolin (ANCEF) IVPB 2g/100 mL premix        2 g 200 mL/hr over 30 Minutes Intravenous On call to O.R. 09/20/22 2229 09/22/22 0559       Data Reviewed: I have personally reviewed following labs and imaging studies  CBC: Recent Labs  Lab 09/20/22 1807 09/22/22 0314 09/23/22 0445 09/24/22 0526  WBC 8.5 9.8 8.8 8.5  NEUTROABS 5.6  --   --   --   HGB 11.8* 7.2* 8.5* 8.1*  HCT 35.4* 21.0* 24.4* 23.9*  MCV 95.7 93.8 91.4 92.3  PLT 247 166 159 650   Basic Metabolic Panel: Recent Labs  Lab 09/20/22 1807 09/21/22 2012 09/21/22 2149 09/22/22 0314 09/23/22 0445 09/24/22 0526  NA 133* 130*  --  128* 126* 132*  K 3.8 4.3  --  4.2 3.9 4.2  CL 98  96*  --  96* 96* 105  CO2 26 23  --  '25 24 24  '$ GLUCOSE 117* 149*  --  114* 119* 112*  BUN 22 22  --  '21 18 16  '$ CREATININE 1.67* 1.55*  --  1.44* 1.38* 1.26*  CALCIUM 9.3 8.2*  --  7.4* 7.6* 7.8*  MG  --   --  1.6*  --   --   --   PHOS  --   --  2.9  --   --   --    GFR: Estimated Creatinine Clearance: 29.2 mL/min (A) (by C-G formula based on SCr of 1.26 mg/dL (H)). Liver Function Tests: No results for input(s): "AST", "ALT", "ALKPHOS", "BILITOT", "PROT", "ALBUMIN" in the last 168 hours. No results for input(s): "LIPASE", "AMYLASE" in the last 168 hours. No results for input(s): "AMMONIA" in the last 168 hours. Coagulation Profile: No results for input(s): "INR", "PROTIME" in the last 168 hours. Cardiac Enzymes: No results for input(s): "CKTOTAL", "CKMB", "CKMBINDEX", "TROPONINI" in the last  168 hours. BNP (last 3 results) No results for input(s): "PROBNP" in the last 8760 hours. HbA1C: No results for input(s): "HGBA1C" in the last 72 hours. CBG: Recent Labs  Lab 09/21/22 1934  GLUCAP 135*   Lipid Profile: No results for input(s): "CHOL", "HDL", "LDLCALC", "TRIG", "CHOLHDL", "LDLDIRECT" in the last 72 hours. Thyroid Function Tests: Recent Labs    09/23/22 0445  TSH 2.523   Anemia Panel: No results for input(s): "VITAMINB12", "FOLATE", "FERRITIN", "TIBC", "IRON", "RETICCTPCT" in the last 72 hours. Urine analysis:    Component Value Date/Time   BILIRUBINUR small 12/28/2014 0956   PROTEINUR 30 12/28/2014 0956   UROBILINOGEN 0.2 12/28/2014 0956   NITRITE neg 12/28/2014 0956   LEUKOCYTESUR large (3+) 12/28/2014 0956   Sepsis Labs: '@LABRCNTIP'$ (procalcitonin:4,lacticidven:4)  No results found for this or any previous visit (from the past 240 hour(s)).       Radiology Studies: CT Angio Chest Pulmonary Embolism (PE) W or WO Contrast  Result Date: 09/21/2022 CLINICAL DATA:  Concern for pulmonary embolism. EXAM: CT ANGIOGRAPHY CHEST WITH CONTRAST TECHNIQUE:  Multidetector CT imaging of the chest was performed using the standard protocol during bolus administration of intravenous contrast. Multiplanar CT image reconstructions and MIPs were obtained to evaluate the vascular anatomy. RADIATION DOSE REDUCTION: This exam was performed according to the departmental dose-optimization program which includes automated exposure control, adjustment of the mA and/or kV according to patient size and/or use of iterative reconstruction technique. CONTRAST:  23m OMNIPAQUE IOHEXOL 350 MG/ML SOLN COMPARISON:  Chest radiograph dated 09/21/2022. FINDINGS: Evaluation of this exam is limited due to respiratory motion artifact. Cardiovascular: There is no cardiomegaly or pericardial effusion. Three-vessel coronary vascular calcification. Mild atherosclerotic calcification of the thoracic aorta. No aneurysmal dilatation or dissection. Evaluation of the pulmonary arteries is limited due to respiratory motion. No pulmonary artery embolus identified. Mediastinum/Nodes: No hilar or mediastinal adenopathy. The esophagus is grossly unremarkable. There is a 12 mm left thyroid hypodense nodule. In the setting of significant comorbidities or limited life expectancy, no follow-up recommended (ref: J Am Coll Radiol. 2015 Feb;12(2): 143-50).No mediastinal fluid collection. Lungs/Pleura: Bibasilar subpleural scarring. No focal consolidation, pleural effusion, or pneumothorax. There is a 5 mm subpleural nodule in the superior segment of the left lower lobe (44/6). The central airways are patent. Upper Abdomen: Cholecystectomy. Fatty liver. Partially visualized 4 cm left renal cyst. Musculoskeletal: Osteopenia degenerative changes of the spine. Age indeterminate, likely chronic compression fracture of superior endplate of T3 with approximately 30% loss of vertebral body height and anterior wedging. Correlation with clinical exam and point tenderness recommended. No definite acute osseous pathology. Review  of the MIP images confirms the above findings. IMPRESSION: 1. No acute intrathoracic pathology. No CT evidence of pulmonary artery embolus. 2. A 5 mm subpleural nodule in the superior segment of the left lower lobe. No follow-up needed if patient is low-risk.This recommendation follows the consensus statement: Guidelines for Management of Incidental Pulmonary Nodules Detected on CT Images: From the Fleischner Society 2017; Radiology 2017; 284:228-243. 3. Age indeterminate, likely chronic compression fracture of superior endplate of T3 with approximately 30% loss of vertebral body height and anterior wedging. Correlation with clinical exam and point tenderness recommended. 4. Fatty liver. 5.  Aortic Atherosclerosis (ICD10-I70.0). Electronically Signed   By: AAnner CreteM.D.   On: 09/21/2022 22:43   DG Chest Port 1 View  Result Date: 09/21/2022 CLINICAL DATA:  Dyspnea EXAM: PORTABLE CHEST 1 VIEW COMPARISON:  Chest 09/20/2022 FINDINGS: The heart size and mediastinal contours are  within normal limits. Both lungs are clear. The visualized skeletal structures are unremarkable. IMPRESSION: No active disease. Electronically Signed   By: Franchot Gallo M.D.   On: 09/21/2022 19:57   DG FEMUR, MIN 2 VIEWS RIGHT  Result Date: 09/21/2022 CLINICAL DATA:  Right hip fracture fixation. EXAM: RIGHT FEMUR 2 VIEWS COMPARISON:  Radiographs from yesterday. FINDINGS: There is a long intramedullary rod in the femur with a proximal dynamic hip screw transfixing the intertrochanteric fracture with anatomic reduction. Two distal interlocking screws are also noted. IMPRESSION: Internal fixation of intertrochanteric right hip fracture with anatomic reduction. Electronically Signed   By: Marijo Sanes M.D.   On: 09/21/2022 15:39   DG HIP UNILAT WITH PELVIS 2-3 VIEWS RIGHT  Result Date: 09/21/2022 CLINICAL DATA:  Elective surgery.  Right femoral intramedullary nail EXAM: DG HIP (WITH OR WITHOUT PELVIS) 2-3V RIGHT COMPARISON:   Right hip radiographs 09/20/2022 FINDINGS: Images were performed intraoperatively without the presence of a radiologist. The patient is undergoing long cephalomedullary nail fixation of the right femur. The patient is seen intertrochanteric fracture is now normally aligned. Total fluoroscopy images: 4 Total fluoroscopy time: 100 seconds Total dose: Radiation Exposure Index (as provided by the fluoroscopic device): 20.92 mGy air Kerma Please see intraoperative findings for further detail. IMPRESSION: Intraoperative fluoroscopic guidance for long cephalomedullary nail fixation of the right femur. Electronically Signed   By: Yvonne Kendall M.D.   On: 09/21/2022 14:41   DG C-Arm 1-60 Min-No Report  Result Date: 09/21/2022 Fluoroscopy was utilized by the requesting physician.  No radiographic interpretation.   DG Chest 1 View  Result Date: 09/20/2022 CLINICAL DATA:  Fall EXAM: CHEST  1 VIEW COMPARISON:  09/27/2020 FINDINGS: Low lung volumes. No acute airspace disease or effusion. Upper normal cardiomediastinal silhouette augmented by low lung volume. No pneumothorax IMPRESSION: No active disease.  Low lung volume Electronically Signed   By: Donavan Foil M.D.   On: 09/20/2022 18:42   DG Hip Unilat W or Wo Pelvis 2-3 Views Right  Result Date: 09/20/2022 CLINICAL DATA:  Hip pain with fall EXAM: DG HIP (WITH OR WITHOUT PELVIS) 2-3V RIGHT COMPARISON:  None Available. FINDINGS: Hardware in the lower lumbar spine. Pubic symphysis and rami appear intact. Multiple clips in the pelvis. Acute comminuted intertrochanteric fracture with displacement. No femoral head dislocation IMPRESSION: Acute comminuted and displaced right intertrochanteric fracture Electronically Signed   By: Donavan Foil M.D.   On: 09/20/2022 18:41            LOS: 3 days       Emeterio Reeve, DO [facility] 09/24/2022, 1:13 PM   Staff may message me via secure chat in [place]  but this may not receive immediate  response,  please page for urgent matters!  If 7PM-7AM, please contact night-coverage [email or web address]  Dictation software was used to generate the above note. Typos may occur and escape review, as with typed/written notes. Please contact Dr Sheppard Coil directly for clarity if needed.

## 2022-09-24 NOTE — Plan of Care (Signed)

## 2022-09-24 NOTE — Progress Notes (Signed)
Physical Therapy Treatment Patient Details Name: Pamela Paul MRN: [id number] DOB: 02-Feb-1926 Today's Date: 09/24/2022   History of Present Illness Pt is a 88 y/o F admitted on 09/20/22 after presenting 2/2 falling at home & inability to bear weight. Pt found to have R femur intertrochanteric fx. Pt underwent R IM nailing on 09/21/22.  PMH: hypothyroid, h/o breast CA, CKD    PT Comments    Patient agreeable to PT session. She was requesting to get up to the chair. Her supportive son leaves the room during mobility efforts and reports he assisted her with HEP earlier. The patient continues to require physical assistance with mobility but reports today felt easier for her with better pain control compared to yesterday. She required Max A for sit to stand transfer from bed using rolling walker. Pre-gait activity performed with emphasis on using UE support to off load RLE with standing, weight shifting, etc. Patient unable to advance either LE with standing. After short seated rest break, patient required maximal assistance for lateral scoot transfer from bed to chair, as she was unable to stand from the bed with repeated attempts due to fatigue. Heart rate 103bpm with activity. Patient pleased to be sitting up in the chair at end of session. Recommend to continue PT to maximize independence and decrease caregiver burden. SNF recommended at discharge.    Recommendations for follow up therapy are one component of a multi-disciplinary discharge planning process, led by the attending physician.  Recommendations may be updated based on patient status, additional functional criteria and insurance authorization.  Follow Up Recommendations  Skilled nursing-short term rehab (<3 hours/day) Can patient physically be transported by private vehicle: No   Assistance Recommended at Discharge Frequent or constant Supervision/Assistance  Patient can return home with the following Two people to help with walking  and/or transfers;Two people to help with bathing/dressing/bathroom;Assist for transportation;Assistance with cooking/housework   Equipment Recommendations   (to be determined at next level of care)    Recommendations for Other Services       Precautions / Restrictions Precautions Precautions: Fall Restrictions Weight Bearing Restrictions: Yes RLE Weight Bearing: Weight bearing as tolerated     Mobility  Bed Mobility Overal bed mobility: Needs Assistance Bed Mobility: Sit to Supine     Supine to sit: Max assist, HOB elevated     General bed mobility comments: assistance for LE and trunk support. verbal cues for technique    Transfers Overall transfer level: Needs assistance   Transfers: Sit to/from Stand Sit to Stand: Max assist, From elevated surface          Lateral/Scoot Transfers: Max assist, From elevated surface General transfer comment: patient performed sit to stand transfer with rolling walker from the bed with lifting and lowering assistance provided. verbal cues for hand placement and technique for standing. she was unable to take steps with either LE in while standing. lateral scoot transfer performed from bed to chair    Ambulation/Gait             Pre-gait activities: faciliation and cues for weight shifting for off loading RLE in order to take steps. unable to advance either LE due to generalized weakness, pain in right hip with partial weight bearing.     Stairs             Wheelchair Mobility    Modified Rankin (Stroke Patients Only)       Balance Overall balance assessment: Needs assistance Sitting-balance support: Feet supported Sitting balance-Leahy Scale:  Fair                                      Cognition Arousal/Alertness: Awake/alert Behavior During Therapy: WFL for tasks assessed/performed Overall Cognitive Status: Within Functional Limits for tasks assessed                                           Exercises General Exercises - Lower Extremity Ankle Circles/Pumps: AROM, Strengthening, Right, 10 reps, Seated Long Arc Quad: AAROM, Strengthening, Right, 10 reps, Seated    General Comments General comments (skin integrity, edema, etc.): heart rate up to 103bpm with activity. patient reports the transfer was easier for her today and pain is less compared to yesterday      Pertinent Vitals/Pain Pain Assessment Pain Assessment: Faces Faces Pain Scale: Hurts little more Pain Location: right hip Pain Descriptors / Indicators: Discomfort Pain Intervention(s): Premedicated before session, Monitored during session, Limited activity within patient's tolerance, Repositioned    Home Living                          Prior Function            PT Goals (current goals can now be found in the care plan section) Acute Rehab PT Goals Patient Stated Goal: get better with less pain PT Goal Formulation: With patient Time For Goal Achievement: 10/06/22 Potential to Achieve Goals: Good Progress towards PT goals: Progressing toward goals    Frequency    BID      PT Plan Current plan remains appropriate    Co-evaluation              AM-PAC PT "6 Clicks" Mobility   Outcome Measure  Help needed turning from your back to your side while in a flat bed without using bedrails?: A Lot Help needed moving from lying on your back to sitting on the side of a flat bed without using bedrails?: A Lot Help needed moving to and from a bed to a chair (including a wheelchair)?: Total Help needed standing up from a chair using your arms (e.g., wheelchair or bedside chair)?: Total Help needed to walk in hospital room?: Total Help needed climbing 3-5 steps with a railing? : Total 6 Click Score: 8    End of Session   Activity Tolerance: Patient tolerated treatment well Patient left: in chair;with call bell/phone within reach;with chair alarm set Nurse Communication:  Mobility status PT Visit Diagnosis: Muscle weakness (generalized) (M62.81);Difficulty in walking, not elsewhere classified (R26.2);Unsteadiness on feet (R26.81);Pain Pain - Right/Left: Right Pain - part of body: Hip     Time: 0881-1031 PT Time Calculation (min) (ACUTE ONLY): 26 min  Charges:  $Therapeutic Activity: 23-37 mins                     Pamela Paul, PT, MPT    Percell Locus 09/24/2022, 12:52 PM

## 2022-09-25 DIAGNOSIS — S72144A Nondisplaced intertrochanteric fracture of right femur, initial encounter for closed fracture: Secondary | ICD-10-CM | POA: Diagnosis not present

## 2022-09-25 DIAGNOSIS — D62 Acute posthemorrhagic anemia: Secondary | ICD-10-CM | POA: Diagnosis not present

## 2022-09-25 DIAGNOSIS — R338 Other retention of urine: Secondary | ICD-10-CM

## 2022-09-25 DIAGNOSIS — E871 Hypo-osmolality and hyponatremia: Secondary | ICD-10-CM | POA: Diagnosis not present

## 2022-09-25 LAB — CBC
HCT: 22.3 % — ABNORMAL LOW (ref 36.0–46.0)
Hemoglobin: 7.6 g/dL — ABNORMAL LOW (ref 12.0–15.0)
MCH: 32.1 pg (ref 26.0–34.0)
MCHC: 34.1 g/dL (ref 30.0–36.0)
MCV: 94.1 fL (ref 80.0–100.0)
Platelets: 198 10*3/uL (ref 150–400)
RBC: 2.37 MIL/uL — ABNORMAL LOW (ref 3.87–5.11)
RDW: 13.1 % (ref 11.5–15.5)
WBC: 8.6 10*3/uL (ref 4.0–10.5)
nRBC: 0 % (ref 0.0–0.2)

## 2022-09-25 LAB — BASIC METABOLIC PANEL
Anion gap: 4 — ABNORMAL LOW (ref 5–15)
BUN: 16 mg/dL (ref 8–23)
CO2: 25 mmol/L (ref 22–32)
Calcium: 7.7 mg/dL — ABNORMAL LOW (ref 8.9–10.3)
Chloride: 103 mmol/L (ref 98–111)
Creatinine, Ser: 1.28 mg/dL — ABNORMAL HIGH (ref 0.44–1.00)
GFR, Estimated: 39 mL/min — ABNORMAL LOW (ref >60)
Glucose, Bld: 95 mg/dL (ref 70–99)
Potassium: 4 mmol/L (ref 3.5–5.1)
Sodium: 132 mmol/L — ABNORMAL LOW (ref 135–145)

## 2022-09-25 MED ORDER — CHLORHEXIDINE GLUCONATE CLOTH 2 % EX PADS
6.0000 | MEDICATED_PAD | Freq: Every day | CUTANEOUS | Status: DC
  Administered 2022-09-25: 6 via TOPICAL

## 2022-09-25 MED ORDER — PANTOPRAZOLE SODIUM 40 MG PO TBEC: 40.0000 mg | DELAYED_RELEASE_TABLET | Freq: Every day | ORAL | Status: DC

## 2022-09-25 MED ORDER — TAMSULOSIN HCL 0.4 MG PO CAPS: 0.4000 mg | ORAL_CAPSULE | Freq: Every day | ORAL | 0 refills | Status: DC

## 2022-09-25 MED ORDER — TAMSULOSIN HCL 0.4 MG PO CAPS: 0.4000 mg | ORAL_CAPSULE | Freq: Every day | ORAL | Status: DC

## 2022-09-25 MED ORDER — NITROFURANTOIN MACROCRYSTAL 100 MG PO CAPS: 100.0000 mg | ORAL_CAPSULE | Freq: Every day | ORAL | 0 refills | Status: AC

## 2022-09-25 NOTE — TOC Progression Note (Signed)
Transition of Care [facility]) - Progression Note    Patient Details  Name: Roxanne Panek MRN: [id number] Date of Birth: 08/31/1926  Transition of Care [facility]) CM/SW [place], RN Phone Number: 09/25/2022, 11:18 AM  Clinical Narrative:     she is approved 10/23 - 10/29, next review 10/30,ref#  [id number]  Expected Discharge Plan: [place] Barriers to Discharge: Continued Medical Work up  Expected Discharge Plan and Services Expected Discharge Plan: [place]       Living arrangements for the past 2 months: Single Family Home                                       Social Determinants of Health (SDOH) Interventions Food Insecurity Interventions: Intervention Not Indicated Housing Interventions: Intervention Not Indicated Transportation Interventions: Intervention Not Indicated Utilities Interventions: Intervention Not Indicated  Readmission Risk Interventions     No data to display

## 2022-09-25 NOTE — Care Management Important Message (Signed)
Important Message  Patient Details  Name: Pamela Paul MRN: [id number] Date of Birth: 05-29-1926   Medicare Important Message Given:  Yes     Dannette Barbara 09/25/2022, 12:56 PM

## 2022-09-25 NOTE — Discharge Summary (Signed)
Physician Discharge Summary   Patient: Pamela Paul MRN: [id number] DOB: 11-Sep-1926  Admit date:     09/20/2022  Discharge date: 09/25/22  Discharge Physician: Annita Brod   PCP: Dewayne Shorter, MD   Recommendations at discharge:   New medication: Lovenox 30 mg subcu daily x2 weeks Patient will follow-up with orthopedic surgery in 2 weeks Patient will follow-up with urology in 1 week for Foley catheter removal and voiding trial New medication: Tylenol 325 mg, 1 to 2 tablets every 6 hours as needed for mild pain or for fever New medication: Maalox 30 cc p.o. every 4 hours as needed for indigestion New medication: Dulcolax suppository rectally daily as needed for moderate constipation New medication: Colace 100 mg p.o. twice daily New medication: Robaxin 500 mg every 6 hours as needed for muscle spasms New medication: Macrodantin 100 mg p.o. nightly x7 days Patient going to skilled nursing Patient will be going with Foley catheter to be removed when she follows up with urology New medication: Zofran 4 mg every 6 hours as needed for nausea New medication: MiraLAX 17 g p.o. daily as needed for constipation Medication change: Patient previously on Micardis 20 mg p.o. daily, this medication has been stopped for hypotension  Discharge Diagnoses: Principal Problem:   Intertrochanteric fracture of right femur ([place]) Active Problems:   Hypothyroidism   Hypertension   Hyponatremia   Acute postoperative anemia due to expected blood loss  Resolved Problems:   * No resolved hospital problems. [facility] Course: Pamela Paul, a 88 y/o with hypothyroidism, h/o breast cancer, CKD, had a mechanical fall at home with immediate pain and inability to bear weight and presented to the emergency department on 10/18.  Patient found to have an intertrochanteric fracture of the right femur.  Orthopedics was consulted and underwent an intramedullary right nail of her femur done 10/19.  Hospital  course complicated by episode of respiratory distress on 10/19 after getting a dose of fentanyl for pain requiring Narcan x2.  Patient also had blood loss anemia from surgery and transfused 1 unit packed red blood cells on 10/20.  Patient found to have some episodes of hypotension and hypovolemia.  Micardis was stopped.  She also developed some urinary retention on 10/22, likely from her hypotension and Foley catheter was placed.  Patient will follow-up with urology as outpatient for this.  The meantime, started on Flomax and prophylactic antibiotics.  Consultants:  Orthopedics   Procedures: Intramedullary nail right femur 09/21/2022 Transfusion 1 unit packed red blood cells    ASSESSMENT & PLAN:   Principal Problem:   Intertrochanteric fracture of right femur (HCC) Active Problems:   Hypothyroidism   Hypertension   Hyponatremia   Acute postoperative anemia due to expected blood loss   Intertrochanteric fracture of right femur (HCC) Low/moderate risk for low risk surgery Orthopedics service following SNF placement for rehab   Hypothyroidism TSH 08/17/21 with good control. 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  Patient will follow-up with urology.  On Proflex antibiotics and Flomax.   DVT prophylaxis: Lovenox x2 weeks       Pain control - [place] Controlled Substance Reporting System database was reviewed. and patient was instructed, not to  drive, operate heavy machinery, perform activities at heights, swimming or participation in water activities or provide baby-sitting services while on Pain, Sleep and Anxiety Medications; until their outpatient Physician has advised to do so again. Also recommended to not to take more than prescribed Pain, Sleep and Anxiety Medications.   Diet recommendation:  Discharge Diet Orders (From admission, onward)     Start     Ordered   09/25/22 0000  Diet - low sodium heart healthy        09/25/22 1135           Low-sodium diet DISCHARGE MEDICATION: Allergies as of 09/25/2022       Reactions   Bactrim [sulfamethoxazole-trimethoprim] Nausea And Vomiting   Lethargic and weakness        Medication List     STOP taking these medications    telmisartan 20 MG tablet Commonly known as: MICARDIS       TAKE these medications    acetaminophen 325 MG tablet Commonly known as: TYLENOL Take 1-2 tablets (325-650 mg total) by mouth every 6 (six) hours as needed for mild pain (pain score 1-3 or temp > 100.5).   alum & mag hydroxide-simeth 200-200-20 MG/5ML suspension Commonly known as: MAALOX/MYLANTA Take 30 mLs by mouth every 4 (four) hours as needed for indigestion.   bisacodyl 10 MG suppository Commonly known as: DULCOLAX Place 1 suppository (10 mg total) rectally daily as needed for moderate constipation.   Calcium Carbonate-Vitamin D3 600-400 MG-UNIT Tabs Take 1 tablet by mouth 2 (two) times daily.   docusate sodium 100 MG capsule Commonly known as: COLACE Take 1 capsule (100 mg total) by mouth 2 (two) times daily.   enoxaparin 30 MG/0.3ML injection Commonly known as: LOVENOX Inject 0.3 mLs (30 mg total) into the skin daily for 14 days.   levothyroxine 50 MCG tablet Commonly known as: SYNTHROID TAKE 1 TABLET EVERY DAY ON EMPTY STOMACHWITH A GLASS OF WATER AT LEAST 30-60 MINBEFORE BREAKFAST   Magnesium Oxide 250 MG Tabs Take 1 tablet by mouth every other day. At  bedtime   methocarbamol 500 MG tablet Commonly known as: ROBAXIN Take 1 tablet (500 mg total) by mouth every 6 (six) hours as needed for muscle spasms.   nitrofurantoin 100 MG capsule Commonly known as: MACRODANTIN Take 1 capsule (100 mg total) by mouth at bedtime for 7 days.   ondansetron 4 MG tablet Commonly known as: ZOFRAN Take 1 tablet (4 mg total) by mouth every 6 (six) hours as needed for nausea.   oxyCODONE 5 MG immediate release tablet Commonly known as: Roxicodone Take 1 tablet (5 mg total) by mouth every 6 (six) hours as needed for up to 5 days for severe pain.   polyethylene glycol 17 g packet Commonly known as: MIRALAX / GLYCOLAX Take 17 g by mouth daily as needed for mild constipation.   PRESERVISION AREDS PO Take 1 capsule by mouth 2 (two) times daily.   tamsulosin 0.4 MG Caps capsule Commonly known as: FLOMAX Take 1 capsule (0.4 mg total) by mouth daily. Start taking on: September 26, 2022        Contact information for after-discharge care     Destination     [facility] .   Service: Skilled Nursing Contact information:  [place] [phone]                    Discharge Exam: Danley Danker Weights   09/20/22 1805 09/21/22 0514  Weight: 83.9 kg 83.9 kg   Alert and oriented x2, no acute distress Cardiovascular: Regular rate and rhythm, S1-S2 Lungs: Clear to auscultation bilaterally  Condition at discharge: good  The results of significant diagnostics from this hospitalization (including imaging, microbiology, ancillary and laboratory) are listed below for reference.   Imaging Studies: CT Angio Chest Pulmonary Embolism (PE) W or WO Contrast  Result Date: 09/21/2022 CLINICAL DATA:  Concern for pulmonary embolism. EXAM: CT ANGIOGRAPHY CHEST WITH CONTRAST TECHNIQUE: Multidetector CT imaging of the chest was performed using the standard protocol during bolus administration of  intravenous contrast. Multiplanar CT image reconstructions and MIPs were obtained to evaluate the vascular anatomy. RADIATION DOSE REDUCTION: This exam was performed according to the departmental dose-optimization program which includes automated exposure control, adjustment of the mA and/or kV according to patient size and/or use of iterative reconstruction technique. CONTRAST:  79m OMNIPAQUE IOHEXOL 350 MG/ML SOLN COMPARISON:  Chest radiograph dated 09/21/2022. FINDINGS: Evaluation of this exam is limited due to respiratory motion artifact. Cardiovascular: There is no cardiomegaly or pericardial effusion. Three-vessel coronary vascular calcification. Mild atherosclerotic calcification of the thoracic aorta. No aneurysmal dilatation or dissection. Evaluation of the pulmonary arteries is limited due to respiratory motion. No pulmonary artery embolus identified. Mediastinum/Nodes: No hilar or mediastinal adenopathy. The esophagus is grossly unremarkable. There is a 12 mm left thyroid hypodense nodule. In the setting of significant comorbidities or limited life expectancy, no follow-up recommended (ref: J Am Coll Radiol. 2015 Feb;12(2): 143-50).No mediastinal fluid collection. Lungs/Pleura: Bibasilar subpleural scarring. No focal consolidation, pleural effusion, or pneumothorax. There is a 5 mm subpleural nodule in the superior segment of the left lower lobe (44/6). The central airways are patent. Upper Abdomen: Cholecystectomy. Fatty liver. Partially visualized 4 cm left renal cyst. Musculoskeletal: Osteopenia degenerative changes of the spine. Age indeterminate, likely chronic compression fracture of superior endplate of T3 with approximately 30% loss of vertebral body height and anterior wedging. Correlation with clinical exam and point tenderness recommended. No definite acute osseous pathology. Review of the MIP images confirms the above findings. IMPRESSION: 1. No acute intrathoracic pathology. No CT evidence  of pulmonary artery embolus. 2. A 5 mm subpleural nodule in the superior segment of the left lower lobe. No follow-up needed if patient is low-risk.This recommendation follows the consensus statement: Guidelines for Management of Incidental Pulmonary Nodules Detected on CT Images: From the Fleischner Society 2017; Radiology 2017; 284:228-243. 3. Age indeterminate, likely chronic compression fracture of superior endplate of T3 with approximately 30% loss of vertebral body height and anterior wedging. Correlation with clinical exam and point tenderness recommended. 4. Fatty liver. 5.  Aortic Atherosclerosis (ICD10-I70.0). Electronically Signed   By: AAnner CreteM.D.   On: 09/21/2022 22:43   DG Chest Port 1 View  Result Date: 09/21/2022 CLINICAL DATA:  Dyspnea EXAM: PORTABLE CHEST 1 VIEW COMPARISON:  Chest 09/20/2022 FINDINGS: The heart size and mediastinal contours are within normal limits. Both lungs are clear. The visualized skeletal structures are unremarkable. IMPRESSION: No active disease. Electronically Signed   By: CFranchot GalloM.D.   On: 09/21/2022 19:57   DG FEMUR, MIN 2 VIEWS RIGHT  Result Date: 09/21/2022 CLINICAL DATA:  Right hip fracture fixation. EXAM: RIGHT FEMUR 2 VIEWS COMPARISON:  Radiographs from yesterday. FINDINGS: There  is a long intramedullary rod in the femur with a proximal dynamic hip screw transfixing the intertrochanteric fracture with anatomic reduction. Two distal interlocking screws are also noted. IMPRESSION: Internal fixation of intertrochanteric right hip fracture with anatomic reduction. Electronically Signed   By: Marijo Sanes M.D.   On: 09/21/2022 15:39   DG HIP UNILAT WITH PELVIS 2-3 VIEWS RIGHT  Result Date: 09/21/2022 CLINICAL DATA:  Elective surgery.  Right femoral intramedullary nail EXAM: DG HIP (WITH OR WITHOUT PELVIS) 2-3V RIGHT COMPARISON:  Right hip radiographs 09/20/2022 FINDINGS: Images were performed intraoperatively without the presence of a  radiologist. The patient is undergoing long cephalomedullary nail fixation of the right femur. The patient is seen intertrochanteric fracture is now normally aligned. Total fluoroscopy images: 4 Total fluoroscopy time: 100 seconds Total dose: Radiation Exposure Index (as provided by the fluoroscopic device): 20.92 mGy air Kerma Please see intraoperative findings for further detail. IMPRESSION: Intraoperative fluoroscopic guidance for long cephalomedullary nail fixation of the right femur. Electronically Signed   By: Yvonne Kendall M.D.   On: 09/21/2022 14:41   DG C-Arm 1-60 Min-No Report  Result Date: 09/21/2022 Fluoroscopy was utilized by the requesting physician.  No radiographic interpretation.   DG Chest 1 View  Result Date: 09/20/2022 CLINICAL DATA:  Fall EXAM: CHEST  1 VIEW COMPARISON:  09/27/2020 FINDINGS: Low lung volumes. No acute airspace disease or effusion. Upper normal cardiomediastinal silhouette augmented by low lung volume. No pneumothorax IMPRESSION: No active disease.  Low lung volume Electronically Signed   By: Donavan Foil M.D.   On: 09/20/2022 18:42   DG Hip Unilat W or Wo Pelvis 2-3 Views Right  Result Date: 09/20/2022 CLINICAL DATA:  Hip pain with fall EXAM: DG HIP (WITH OR WITHOUT PELVIS) 2-3V RIGHT COMPARISON:  None Available. FINDINGS: Hardware in the lower lumbar spine. Pubic symphysis and rami appear intact. Multiple clips in the pelvis. Acute comminuted intertrochanteric fracture with displacement. No femoral head dislocation IMPRESSION: Acute comminuted and displaced right intertrochanteric fracture Electronically Signed   By: Donavan Foil M.D.   On: 09/20/2022 18:41    Microbiology: Results for orders placed or performed in visit on 10/09/13  Urine Culture     Status: None   Collection Time: 10/09/13 11:13 AM   Specimen: Urine  Result Value Ref Range Status   Culture ESCHERICHIA COLI  Final   Colony Count >=100,000 COLONIES/ML  Final   Organism ID, Bacteria  ESCHERICHIA COLI  Final      Susceptibility   Escherichia coli -  (no method available)    AMPICILLIN <=2 Sensitive     AMPICILLIN/SULBACTAM <=2 Sensitive     PIP/TAZO <=4 Sensitive     IMIPENEM <=0.25 Sensitive     CEFAZOLIN <=4 Sensitive     CEFOXITIN <=4 Sensitive     CEFTRIAXONE <=1 Sensitive     CEFTAZIDIME <=1 Sensitive     CEFEPIME <=1 Sensitive     GENTAMICIN <=1 Sensitive     TOBRAMYCIN <=1 Sensitive     CIPROFLOXACIN <=0.25 Sensitive     LEVOFLOXACIN <=0.12 Sensitive     NITROFURANTOIN <=16 Sensitive     TRIMETH/SULFA <=20 Sensitive     Labs: CBC: Recent Labs  Lab 09/20/22 1807 09/22/22 0314 09/23/22 0445 09/24/22 0526 09/25/22 0304  WBC 8.5 9.8 8.8 8.5 8.6  NEUTROABS 5.6  --   --   --   --   HGB 11.8* 7.2* 8.5* 8.1* 7.6*  HCT 35.4* 21.0* 24.4* 23.9* 22.3*  MCV 95.7  93.8 91.4 92.3 94.1  PLT 247 166 159 187 735   Basic Metabolic Panel: Recent Labs  Lab 09/21/22 2012 09/21/22 2149 09/22/22 0314 09/23/22 0445 09/24/22 0526 09/25/22 0304  NA 130*  --  128* 126* 132* 132*  K 4.3  --  4.2 3.9 4.2 4.0  CL 96*  --  96* 96* 105 103  CO2 23  --  '25 24 24 25  '$ GLUCOSE 149*  --  114* 119* 112* 95  BUN 22  --  '21 18 16 16  '$ CREATININE 1.55*  --  1.44* 1.38* 1.26* 1.28*  CALCIUM 8.2*  --  7.4* 7.6* 7.8* 7.7*  MG  --  1.6*  --   --   --   --   PHOS  --  2.9  --   --   --   --    Liver Function Tests: No results for input(s): "AST", "ALT", "ALKPHOS", "BILITOT", "PROT", "ALBUMIN" in the last 168 hours. CBG: Recent Labs  Lab 09/21/22 1934  GLUCAP 135*    Discharge time spent: less than 30 minutes.  Signed: Annita Brod, MD [facility] 09/25/2022

## 2022-09-25 NOTE — Progress Notes (Signed)
Occupational Therapy Treatment Patient Details Name: Pamela Paul MRN: [id number] DOB: 03-14-1926 Today's Date: 09/25/2022   History of present illness Pt is a 88 y/o F admitted on 09/20/22 after presenting 2/2 falling at home & inability to bear weight. Pt found to have R femur intertrochanteric fx. Pt underwent R IM nailing on 09/21/22.  PMH: hypothyroid, h/o breast CA, CKD   OT comments  Pt seen for OT session today; bed mobility as described below. Only able to tolerate approx 3 minutes seated EOB and constantly leaning L to offweight from painful R hip. Education provided to pt and son about PRN vs scheduled pain medications to help pt be more successful in next level of care (SNF rehab). Pt requiring set up for brushing teeth today, but needing to be in semi-reclined in bed rather than EOB for grooming 2/2 hip pain in sitting position.    Recommendations for follow up therapy are one component of a multi-disciplinary discharge planning process, led by the attending physician.  Recommendations may be updated based on patient status, additional functional criteria and insurance authorization.    Follow Up Recommendations  Skilled nursing-short term rehab (<3 hours/day)    Assistance Recommended at Discharge Frequent or constant Supervision/Assistance  Patient can return home with the following  A lot of help with walking and/or transfers;A lot of help with bathing/dressing/bathroom;Assist for transportation;Direct supervision/assist for financial management;Direct supervision/assist for medications management   Equipment Recommendations  Other (comment) (defer to next venue of care)    Recommendations for Other Services      Precautions / Restrictions Precautions Precautions: Fall Restrictions Weight Bearing Restrictions: Yes RLE Weight Bearing: Weight bearing as tolerated       Mobility Bed Mobility Overal bed mobility: Needs Assistance Bed Mobility: Supine to Sit, Sit to  Supine     Supine to sit: Mod assist, HOB elevated Sit to supine: Max assist, +2 for physical assistance (NA assisted to ensure trunk returned safely to bed; OT assisted with BIL LE mobility)        Transfers                         Balance Overall balance assessment: Needs assistance Sitting-balance support: Feet supported Sitting balance-Leahy Scale: Fair (leaning to the L to try to offweight from painful R hip) Sitting balance - Comments: strong L lateral lean to offweight from painful R hip                                   ADL either performed or assessed with clinical judgement   ADL                                              Extremity/Trunk Assessment Upper Extremity Assessment Upper Extremity Assessment: Overall WFL for tasks assessed            Vision       Perception     Praxis      Cognition Arousal/Alertness: Awake/alert Behavior During Therapy: WFL for tasks assessed/performed Overall Cognitive Status: Within Functional Limits for tasks assessed                                 General  Comments: patient able to follow single step commands with increased time        Exercises      Shoulder Instructions       General Comments patient has decreased weight acceptance on RLE. she reports less pain and feeling of increased independence with mobility today compared to previous sessions. maximal assistance for peri-care following bowel movement in the bed side commode    Pertinent Vitals/ Pain       Pain Assessment Pain Assessment: 0-10 Pain Score:  (unable to give score- "a lot") Pain Location: right hip Pain Descriptors / Indicators: Discomfort Pain Intervention(s): Limited activity within patient's tolerance  Home Living                                          Prior Functioning/Environment              Frequency  Min 2X/week        Progress Toward  Goals  OT Goals(current goals can now be found in the care plan section)  Progress towards OT goals: Progressing toward goals  Acute Rehab OT Goals Patient Stated Goal: go to rehab OT Goal Formulation: With patient Time For Goal Achievement: 10/06/22 Potential to Achieve Goals: Good ADL Goals Pt Will Perform Grooming: standing;with min guard assist Pt Will Perform Lower Body Dressing: with min assist;sit to/from stand Pt Will Transfer to Toilet: with min assist;ambulating Pt Will Perform Toileting - Clothing Manipulation and hygiene: with min assist;sit to/from stand  Plan Discharge plan remains appropriate    Co-evaluation                 AM-PAC OT "6 Clicks" Daily Activity     Outcome Measure   Help from another person eating meals?: None Help from another person taking care of personal grooming?: A Little Help from another person toileting, which includes using toliet, bedpan, or urinal?: A Lot Help from another person bathing (including washing, rinsing, drying)?: A Lot Help from another person to put on and taking off regular upper body clothing?: A Little Help from another person to put on and taking off regular lower body clothing?: Total 6 Click Score: 15    End of Session  Pt left semi-reclined in bed, son in room, NA aware of pt status.  OT Visit Diagnosis: Unsteadiness on feet (R26.81);Repeated falls (R29.6);Muscle weakness (generalized) (M62.81);History of falling (Z91.81)   Activity Tolerance Patient limited by pain   Patient Left in bed;with call bell/phone within reach;with family/visitor present (Son in room. OT education on how to ask about PRN vs scheduled pain medications when pt is in rehab to ensure she is pre-medicated for sessions)   Nurse Communication Mobility status (Communicated with NA)        Time: 3335-4562 OT Time Calculation (min): 25 min  Charges: OT General Charges $OT Visit: 1 Visit OT Treatments $Self Care/Home Management :  23-37 mins  Waymon Amato, MS, OTR/L   Vania Rea 09/25/2022, 11:53 AM

## 2022-09-25 NOTE — Plan of Care (Signed)

## 2022-09-25 NOTE — Progress Notes (Addendum)
Patient discharged, family at bedside.Report given to [facility] nurse Sherren Mocha). Awaiting EMS pick-up, # 5 on EMS list.  1716: Patient picked up by EMS. Family at bedside. All belongings with patient. Foley in placed.

## 2022-09-25 NOTE — TOC Progression Note (Signed)
Transition of Care [facility]) - Progression Note    Patient Details  Name: Pamela Paul MRN: [id number] Date of Birth: [birth year]  [facility]) CM/SW [place], RN Phone Number: 09/25/2022, 1:47 PM  Clinical Narrative:     Patient going to room 102 at [facility], EMS to transport, Family is in the room and aware Called EMS to transport 4 ahead of her   Expected Discharge Plan: [place] Barriers to Discharge: Continued Medical Work up  Expected Discharge Plan and Services Expected Discharge Plan: [place]       Living arrangements for the past 2 months: Single Family Home Expected Discharge Date: 09/25/22                                     Social Determinants of Health (SDOH) Interventions Food Insecurity Interventions: Intervention Not Indicated Housing Interventions: Intervention Not Indicated Transportation Interventions: Intervention Not Indicated Utilities Interventions: Intervention Not Indicated  Readmission Risk Interventions     No data to display

## 2022-09-25 NOTE — TOC Progression Note (Signed)
Transition of Care [facility]) - Progression Note    Patient Details  Name: Pamela Paul MRN: [id number] Date of Birth: [birth year]  [facility] CM/SW [place], RN Phone Number: 09/25/2022, 9:52 AM  Clinical Narrative:    [place] Ins pending   Expected Discharge Plan: [place] Barriers to Discharge: Continued Medical Work up  Expected Discharge Plan and Services Expected Discharge Plan: [place]       Living arrangements for the past 2 months: Single Family Home                                       Social Determinants of Health (SDOH) Interventions Food Insecurity Interventions: Intervention Not Indicated Housing Interventions: Intervention Not Indicated Transportation Interventions: Intervention Not Indicated Utilities Interventions: Intervention Not Indicated  Readmission Risk Interventions     No data to display

## 2022-09-25 NOTE — Progress Notes (Signed)
PHARMACIST - PHYSICIAN COMMUNICATION  CONCERNING: IV to Oral Route Change Policy  RECOMMENDATION: This patient is receiving pantoprazole by the intravenous route.  Based on criteria approved by the Pharmacy and Therapeutics Committee, the intravenous medication(s) is/are being converted to the equivalent oral dose form(s).   DESCRIPTION: These criteria include: The patient is eating (either orally or via tube) and/or has been taking other orally administered medications for a least 24 hours The patient has no evidence of active gastrointestinal bleeding or impaired GI absorption (gastrectomy, short bowel, patient on TNA or NPO).  If you have questions about this conversion, please contact the [place], [facility] 09/25/2022 9:17 AM

## 2022-09-25 NOTE — Progress Notes (Signed)
Physical Therapy Treatment Patient Details Name: Pamela Paul MRN: [id number] DOB: May 16, 1926 Today's Date: 09/25/2022   History of Present Illness Pt is a 88 y/o F admitted on 09/20/22 after presenting 2/2 falling at home & inability to bear weight. Pt found to have R femur intertrochanteric fx. Pt underwent R IM nailing on 09/21/22.  PMH: hypothyroid, h/o breast CA, CKD   PT Comments    Patient was agreeable to PT session. She reports needing to have a bowel movement. Encouraged patient to get OOB to the bed side commode, and she was agreeable. The patient required maximal assistance for bed mobility with cues for technique. Maximal assistance also required for transfers, stand pivot to and from bed side commode. Maximal assistance for peri-care following bowel movement. Decreased weight acceptance on RLE with transfers. She was fatigued with activity but reports today was easier than yesterday. Recommend to continue PT to maximize independence and facilitate return to prior level of function. SNF recommended at discharge.    Recommendations for follow up therapy are one component of a multi-disciplinary discharge planning process, led by the attending physician.  Recommendations may be updated based on patient status, additional functional criteria and insurance authorization.  Follow Up Recommendations  Skilled nursing-short term rehab (<3 hours/day) Can patient physically be transported by private vehicle: No   Assistance Recommended at Discharge Frequent or constant Supervision/Assistance  Patient can return home with the following Two people to help with walking and/or transfers;Two people to help with bathing/dressing/bathroom;Assist for transportation;Assistance with cooking/housework   Equipment Recommendations   (to be determined at next level of care)    Recommendations for Other Services       Precautions / Restrictions Precautions Precautions: Fall Restrictions Weight  Bearing Restrictions: Yes RLE Weight Bearing: Weight bearing as tolerated     Mobility  Bed Mobility Overal bed mobility: Needs Assistance Bed Mobility: Supine to Sit, Sit to Supine     Supine to sit: Max assist Sit to supine: Max assist   General bed mobility comments: assistance for BLE and trunk support. verbal cues for technique. increased time and effort required    Transfers Overall transfer level: Needs assistance Equipment used: None Transfers: Bed to chair/wheelchair/BSC            Lateral/Scoot Transfers: Max assist General transfer comment: patient performed stand pivot transfer from bed to bed side commode and back to bed. verbal cues for technique and sequencing to facilitate independence.    Ambulation/Gait                   Stairs             Wheelchair Mobility    Modified Rankin (Stroke Patients Only)       Balance Overall balance assessment: Needs assistance Sitting-balance support: Feet supported Sitting balance-Leahy Scale: Fair                                      Cognition Arousal/Alertness: Awake/alert Behavior During Therapy: WFL for tasks assessed/performed Overall Cognitive Status: Within Functional Limits for tasks assessed                                 General Comments: patient able to follow single step commands with increased time        Exercises      General  Comments General comments (skin integrity, edema, etc.): patient has decreased weight acceptance on RLE. she reports less pain and feeling of increased independence with mobility today compared to previous sessions. maximal assistance for peri-care following bowel movement in the bed side commode      Pertinent Vitals/Pain Pain Assessment Pain Assessment: Faces Faces Pain Scale: Hurts little more Pain Location: right hip Pain Descriptors / Indicators: Discomfort Pain Intervention(s): Limited activity within patient's  tolerance, Repositioned, Monitored during session    Home Living                          Prior Function            PT Goals (current goals can now be found in the care plan section) Acute Rehab PT Goals Patient Stated Goal: to get better PT Goal Formulation: With patient Time For Goal Achievement: 10/06/22 Potential to Achieve Goals: Good Progress towards PT goals: Progressing toward goals    Frequency    BID      PT Plan Current plan remains appropriate    Co-evaluation              AM-PAC PT "6 Clicks" Mobility   Outcome Measure  Help needed turning from your back to your side while in a flat bed without using bedrails?: A Lot Help needed moving from lying on your back to sitting on the side of a flat bed without using bedrails?: A Lot Help needed moving to and from a bed to a chair (including a wheelchair)?: Total Help needed standing up from a chair using your arms (e.g., wheelchair or bedside chair)?: Total Help needed to walk in hospital room?: Total Help needed climbing 3-5 steps with a railing? : Total 6 Click Score: 8    End of Session Equipment Utilized During Treatment: Gait belt Activity Tolerance: Patient tolerated treatment well Patient left: in bed;with call bell/phone within reach;with bed alarm set;with SCD's reapplied Nurse Communication: Mobility status (patient had bowel movement) PT Visit Diagnosis: Muscle weakness (generalized) (M62.81);Difficulty in walking, not elsewhere classified (R26.2);Unsteadiness on feet (R26.81);Pain Pain - Right/Left: Right Pain - part of body: Hip     Time: 1010-1030 PT Time Calculation (min) (ACUTE ONLY): 20 min  Charges:  $Therapeutic Activity: 8-22 mins                    Minna Merritts, PT, MPT    Pamela Paul 09/25/2022, 11:45 AM

## 2022-09-25 NOTE — Progress Notes (Signed)
  Subjective:  POD #4 s/p medullary fixation for right intertrochanteric hip fracture.   Patient reports right hip pain as mild at rest.  Patient has been making progress with physical therapy.  She is being discharged to [facility] today.  She will be discharged with a Foley catheter.  She will follow-up with urology for urinary retention.  Patient is on Flomax and antibiotic for UTI prophylaxis.  Objective:   VITALS:   Vitals:   09/24/22 1524 09/24/22 2138 09/25/22 0507 09/25/22 0758  BP: (!) 109/55 120/67 (!) 110/58 (!) 117/57  Pulse: 89 81 73 76  Resp: '18 19 19 17  '$ Temp: 97.6 F (36.4 C) 98.5 F (36.9 C) 98.3 F (36.8 C) 98 F (36.7 C)  TempSrc:  Oral Oral   SpO2: 99% 96% 96% 96%  Weight:      Height:        PHYSICAL EXAM: Right lower extremity Neurovascular intact Sensation intact distally Intact pulses distally Dorsiflexion/Plantar flexion intact Incision: dressing C/D/I No cellulitis present Compartment soft  LABS  Results for orders placed or performed during the hospital encounter of 09/20/22 (from the past 24 hour(s))  CBC     Status: Abnormal   Collection Time: 09/25/22  3:04 AM  Result Value Ref Range   WBC 8.6 4.0 - 10.5 K/uL   RBC 2.37 (L) 3.87 - 5.11 MIL/uL   Hemoglobin 7.6 (L) 12.0 - 15.0 g/dL   HCT 22.3 (L) 36.0 - 46.0 %   MCV 94.1 80.0 - 100.0 fL   MCH 32.1 26.0 - 34.0 pg   MCHC 34.1 30.0 - 36.0 g/dL   RDW 13.1 11.5 - 15.5 %   Platelets 198 150 - 400 K/uL   nRBC 0.0 0.0 - 0.2 %  Basic metabolic panel     Status: Abnormal   Collection Time: 09/25/22  3:04 AM  Result Value Ref Range   Sodium 132 (L) 135 - 145 mmol/L   Potassium 4.0 3.5 - 5.1 mmol/L   Chloride 103 98 - 111 mmol/L   CO2 25 22 - 32 mmol/L   Glucose, Bld 95 70 - 99 mg/dL   BUN 16 8 - 23 mg/dL   Creatinine, Ser 1.28 (H) 0.44 - 1.00 mg/dL   Calcium 7.7 (L) 8.9 - 10.3 mg/dL   GFR, Estimated 39 (L) >60 mL/min   Anion gap 4 (L) 5 - 15    No results found.  Assessment/Plan: 4  Days Post-Op   Principal Problem:   Intertrochanteric fracture of right femur (HCC) Active Problems:   Hypothyroidism   Hypertension   Hyponatremia   Acute postoperative anemia due to expected blood loss  Patient is progressing from an orthopedic standpoint.  She is weightbearing as tolerated on the right lower extremity.  She will continue physical therapy for right lower extremity strengthening and gait training.  Patient will be discharged on Lovenox 30 mg daily until follow-up.  She will follow-up in my office in 10 to 14 days.    Thornton Park , MD 09/25/2022, 2:06 PM

## 2022-09-27 ENCOUNTER — Non-Acute Institutional Stay (SKILLED_NURSING_FACILITY): Payer: Medicare HMO | Admitting: Student

## 2022-09-27 ENCOUNTER — Emergency Department: Payer: Medicare HMO

## 2022-09-27 ENCOUNTER — Encounter: Payer: Self-pay | Admitting: Student

## 2022-09-27 ENCOUNTER — Other Ambulatory Visit: Payer: Self-pay

## 2022-09-27 ENCOUNTER — Emergency Department
Admission: EM | Admit: 2022-09-27 | Discharge: 2022-09-27 | Disposition: A | Discharge status: Skilled Nursing Facility | Payer: Medicare HMO | Attending: Student in an Organized Health Care Education/Training Program | Admitting: Student in an Organized Health Care Education/Training Program

## 2022-09-27 DIAGNOSIS — I4891 Unspecified atrial fibrillation: Secondary | ICD-10-CM

## 2022-09-27 DIAGNOSIS — E871 Hypo-osmolality and hyponatremia: Secondary | ICD-10-CM | POA: Diagnosis not present

## 2022-09-27 DIAGNOSIS — S72144A Nondisplaced intertrochanteric fracture of right femur, initial encounter for closed fracture: Secondary | ICD-10-CM | POA: Diagnosis not present

## 2022-09-27 DIAGNOSIS — Z96641 Presence of right artificial hip joint: Secondary | ICD-10-CM | POA: Insufficient documentation

## 2022-09-27 DIAGNOSIS — Z7189 Other specified counseling: Secondary | ICD-10-CM | POA: Diagnosis not present

## 2022-09-27 DIAGNOSIS — D62 Acute posthemorrhagic anemia: Secondary | ICD-10-CM

## 2022-09-27 DIAGNOSIS — H3530 Unspecified macular degeneration: Secondary | ICD-10-CM

## 2022-09-27 DIAGNOSIS — M542 Cervicalgia: Secondary | ICD-10-CM | POA: Insufficient documentation

## 2022-09-27 DIAGNOSIS — E039 Hypothyroidism, unspecified: Secondary | ICD-10-CM

## 2022-09-27 DIAGNOSIS — I10 Essential (primary) hypertension: Secondary | ICD-10-CM | POA: Diagnosis not present

## 2022-09-27 DIAGNOSIS — N189 Chronic kidney disease, unspecified: Secondary | ICD-10-CM | POA: Diagnosis not present

## 2022-09-27 DIAGNOSIS — R0602 Shortness of breath: Secondary | ICD-10-CM | POA: Diagnosis present

## 2022-09-27 LAB — CBC WITH DIFFERENTIAL/PLATELET
Abs Immature Granulocytes: 0.06 10*3/uL (ref 0.00–0.07)
Basophils Absolute: 0.1 10*3/uL (ref 0.0–0.1)
Basophils Relative: 1 %
Eosinophils Absolute: 0.4 10*3/uL (ref 0.0–0.5)
Eosinophils Relative: 4 %
HCT: 27.7 % — ABNORMAL LOW (ref 36.0–46.0)
Hemoglobin: 9 g/dL — ABNORMAL LOW (ref 12.0–15.0)
Immature Granulocytes: 1 %
Lymphocytes Relative: 16 %
Lymphs Abs: 1.5 10*3/uL (ref 0.7–4.0)
MCH: 31.4 pg (ref 26.0–34.0)
MCHC: 32.5 g/dL (ref 30.0–36.0)
MCV: 96.5 fL (ref 80.0–100.0)
Monocytes Absolute: 1 10*3/uL (ref 0.1–1.0)
Monocytes Relative: 11 %
Neutro Abs: 6.1 10*3/uL (ref 1.7–7.7)
Neutrophils Relative %: 67 %
Platelets: 337 10*3/uL (ref 150–400)
RBC: 2.87 MIL/uL — ABNORMAL LOW (ref 3.87–5.11)
RDW: 13 % (ref 11.5–15.5)
WBC: 9 10*3/uL (ref 4.0–10.5)
nRBC: 0 % (ref 0.0–0.2)

## 2022-09-27 LAB — SAMPLE TO BLOOD BANK

## 2022-09-27 LAB — COMPREHENSIVE METABOLIC PANEL
ALT: 23 U/L (ref 0–44)
AST: 40 U/L (ref 15–41)
Albumin: 3.3 g/dL — ABNORMAL LOW (ref 3.5–5.0)
Alkaline Phosphatase: 78 U/L (ref 38–126)
Anion gap: 9 (ref 5–15)
BUN: 18 mg/dL (ref 8–23)
CO2: 25 mmol/L (ref 22–32)
Calcium: 9.2 mg/dL (ref 8.9–10.3)
Chloride: 101 mmol/L (ref 98–111)
Creatinine, Ser: 1.34 mg/dL — ABNORMAL HIGH (ref 0.44–1.00)
GFR, Estimated: 37 mL/min — ABNORMAL LOW (ref >60)
Glucose, Bld: 117 mg/dL — ABNORMAL HIGH (ref 70–99)
Potassium: 3.8 mmol/L (ref 3.5–5.1)
Sodium: 135 mmol/L (ref 135–145)
Total Bilirubin: 2.2 mg/dL — ABNORMAL HIGH (ref 0.3–1.2)
Total Protein: 6.8 g/dL (ref 6.5–8.1)

## 2022-09-27 LAB — MAGNESIUM: Magnesium: 2 mg/dL (ref 1.7–2.4)

## 2022-09-27 LAB — TROPONIN I (HIGH SENSITIVITY): Troponin I (High Sensitivity): 15 ng/L (ref <18)

## 2022-09-27 MED ORDER — DILTIAZEM HCL ER COATED BEADS 120 MG PO CP24: 120.0000 mg | ORAL_CAPSULE | Freq: Every day | ORAL | 2 refills | Status: DC

## 2022-09-27 MED ORDER — SODIUM CHLORIDE 0.9 % IV BOLUS
500.0000 mL | Freq: Once | INTRAVENOUS | Status: AC
  Administered 2022-09-27: 500 mL via INTRAVENOUS

## 2022-09-27 MED ORDER — DILTIAZEM LOAD VIA INFUSION
5.0000 mg | Freq: Once | INTRAVENOUS | Status: AC
  Administered 2022-09-27: 10 mg via INTRAVENOUS
  Filled 2022-09-27: qty 5

## 2022-09-27 MED ORDER — APIXABAN 5 MG PO TABS: 5.0000 mg | ORAL_TABLET | Freq: Two times a day (BID) | ORAL | 0 refills | Status: DC

## 2022-09-27 MED ORDER — DILTIAZEM HCL ER COATED BEADS 120 MG PO CP24
120.0000 mg | ORAL_CAPSULE | Freq: Once | ORAL | Status: AC
  Administered 2022-09-27: 120 mg via ORAL
  Filled 2022-09-27: qty 1

## 2022-09-27 MED ORDER — DILTIAZEM HCL-DEXTROSE 125-5 MG/125ML-% IV SOLN (PREMIX)
5.0000 mg/h | INTRAVENOUS | Status: DC
  Administered 2022-09-27: 5 mg/h via INTRAVENOUS
  Filled 2022-09-27: qty 125

## 2022-09-27 NOTE — ED Notes (Signed)
Awaiting [facility] to transport back to [facility] '@1940'$ 

## 2022-09-27 NOTE — ED Provider Notes (Signed)
[facility] Provider Note    Event Date/Time   First MD Initiated Contact with Patient 09/27/22 1449     (approximate)   History   Shortness of Breath (Pt. To ED from [facility] via EMS for shortness of breath and neck pain. Pt. Had right hip replacement Monday. Pt. States SOB is intermittently. EMS states pt. Was in and out of A fib en route. Pt. Denies n/v/d or fever.)   HPI  Pamela Paul is a 88 y.o. female   with a history of CKD status post recent admission to the hospital for right intertrochanteric femur fracture presents to the ER for evaluation of shortness of breath.  With EMS was found to be in A-fib with RVR.  Would be going in and out of it during her transfer.  On arrival to the ER she denies any pain or pressure no shortness of breath at this time.  Denies any abdominal pain.  No lower extremity swelling.      Physical Exam   Triage Vital Signs: ED Triage Vitals  Enc Vitals Group     BP 09/27/22 1438 116/82     Pulse --      Resp 09/27/22 1438 18     Temp --      Temp src --      SpO2 09/27/22 1437 97 %     Weight 09/27/22 1441 198 lb 3.1 oz (89.9 kg)     Height 09/27/22 1441 '5\' 5"'$  (1.651 m)     Head Circumference --      Peak Flow --      Pain Score 09/27/22 1441 0     Pain Loc --      Pain Edu? --      Excl. in [place]? --     Most recent vital signs: Vitals:   09/27/22 1437 09/27/22 1438  BP:  116/82  Resp:  18  SpO2: 97% 97%     Constitutional: Alert  Eyes: Conjunctivae are normal.  Head: Atraumatic. Nose: No congestion/rhinnorhea. Mouth/Throat: Mucous membranes are moist.   Neck: Painless ROM.  Cardiovascular:   Perfused but irregularly irregular tachycardic rhythm Respiratory: Normal respiratory effort.  No retractions.  Gastrointestinal: Soft and nontender.  Musculoskeletal:  no deformity Neurologic:  MAE spontaneously. No gross focal neurologic deficits are appreciated.  Skin:  Skin is warm, dry and intact. No  rash noted. Psychiatric: Mood and affect are normal. Speech and behavior are normal.    ED Results / Procedures / Treatments   Labs (all labs ordered are listed, but only abnormal results are displayed) Labs Reviewed  CBC WITH DIFFERENTIAL/PLATELET - Abnormal; Notable for the following components:      Result Value   RBC 2.87 (*)    Hemoglobin 9.0 (*)    HCT 27.7 (*)    All other components within normal limits  MAGNESIUM  COMPREHENSIVE METABOLIC PANEL  SAMPLE TO BLOOD BANK  TROPONIN I (HIGH SENSITIVITY)     EKG  ED ECG REPORT I, Merlyn Lot, the attending physician, personally viewed and interpreted this ECG.   Date: 09/27/2022  EKG Time: 14:47  Rate: 150  Rhythm: afib with rvr  Axis: normal  Intervals: normal qt, rbbb  ST&T Change: nonspecific st abn likely rate dependent    RADIOLOGY Please see ED Course for my review and interpretation.  I personally reviewed all radiographic images ordered to evaluate for the above acute complaints and reviewed radiology reports and findings.  These  findings were personally discussed with the patient.  Please see medical record for radiology report.    PROCEDURES:  Critical Care performed: Yes, see critical care procedure note(s)  .Critical Care  Performed by: Merlyn Lot, MD Authorized by: Merlyn Lot, MD   Critical care provider statement:    Critical care time (minutes):  35   Critical care was necessary to treat or prevent imminent or life-threatening deterioration of the following conditions:  Circulatory failure   Critical care was time spent personally by me on the following activities:  Ordering and performing treatments and interventions, ordering and review of laboratory studies, ordering and review of radiographic studies, pulse oximetry, re-evaluation of patient's condition, review of old charts, obtaining history from patient or surrogate, examination of patient, evaluation of patient's  response to treatment, discussions with primary provider, discussions with consultants and development of treatment plan with patient or surrogate    MEDICATIONS ORDERED IN ED: Medications  diltiazem (CARDIZEM) 1 mg/mL load via infusion 5 mg (10 mg Intravenous Bolus from Bag 09/27/22 1508)    And  diltiazem (CARDIZEM) 125 mg in dextrose 5% 125 mL (1 mg/mL) infusion (5 mg/hr Intravenous New Bag/Given 09/27/22 1507)  sodium chloride 0.9 % bolus 500 mL (500 mLs Intravenous New Bag/Given 09/27/22 1510)     IMPRESSION / MDM / Outlook / ED COURSE  I reviewed the triage vital signs and the nursing notes.                              Differential diagnosis includes, but is not limited to, Asthma, copd, CHF, pna, ptx, malignancy, Pe, anemia  Patient presenting to the ER for evaluation of symptoms as described above.  Based on symptoms, risk factors and considered above differential, this presenting complaint could reflect a potentially life-threatening illness therefore the patient will be placed on continuous pulse oximetry and telemetry for monitoring.  Laboratory evaluation will be sent to evaluate for the above complaints.  Protecting her airway not hypoxic she is tachycardic and shows that she is in A-fib with RVR on telemetry with heart rates in the 150s.  Will be given IV fluid bolus will give IV Cardizembolus and infusion  Does not have a history of CHF.  Will check electrolytes.   Clinical Course as of 09/27/22 1523  Wed Sep 27, 2022  1522 Chest x-ray on my review and interpretation does not show any evidence of infiltrate or consolidation. [PR]  1523 Patient remains well perfused after initiation of Cardizem infusion.  Heart rate now improved to the 120s.  Patient will be signed out to oncoming physician pending follow-up labs anticipate admission. [PR]    Clinical Course User Index [PR] Merlyn Lot, MD    FINAL CLINICAL IMPRESSION(S) / ED DIAGNOSES   Final  diagnoses:  Atrial fibrillation with RVR ([place])     Rx / DC Orders   ED Discharge Orders     None        Note:  This document was prepared using Dragon voice recognition software and may include unintentional dictation errors.    Merlyn Lot, MD 09/27/22 [phone]

## 2022-09-27 NOTE — ED Provider Notes (Signed)
-----------------------------------------   3:30 PM on 09/27/2022 -----------------------------------------  Blood pressure 116/82, resp. rate 18, height '5\' 5"'$  (1.651 m), weight 89.9 kg, SpO2 97 %.  Assuming care from Dr. Quentin Cornwall.  In short, Pamela Paul is a 88 y.o. female with a chief complaint of Shortness of Breath (Pt. To ED from [facility] via EMS for shortness of breath and neck pain. Pt. Had right hip replacement Monday. Pt. States SOB is intermittently. EMS states pt. Was in and out of A fib en route. Pt. Denies n/v/d or fever.) .  Refer to the original H&P for additional details.  The current plan of care is to follow-up lab results and reassess heart rate with AF RVR.  ----------------------------------------- 7:13 PM on 09/27/2022 ----------------------------------------- Patient converted to normal sinus rhythm while on diltiazem drip.  On reassessment, she states that all symptoms have resolved and she denies any chest pain or shortness of breath.  Labs are reassuring with stable chronic kidney disease, no electrolyte abnormality or leukocytosis noted, anemia improved compared to previous.  Case discussed with Dr. Garen Lah of cardiology, who recommends giving dose of 120 mg of long-acting diltiazem prior to cessation of Cardizem drip.  He agrees with plan for discharge back to nursing facility and outpatient cardiology follow-up, we will also transition patient from Lovenox to Eliquis given her elevated CHA2DS2-VASc score.  Patient transitioned off of diltiazem drip and remains in normal sinus rhythm with good heart rate and blood pressure.  We will arrange for transport back to nursing facility with prescriptions for diltiazem and Eliquis sent to patient's pharmacy.  ED ECG REPORT I, Blake Divine, the attending physician, personally viewed and interpreted this ECG.   Date: 09/27/2022  EKG Time: 18:22  Rate: 82  Rhythm: normal sinus rhythm  Axis: Normal  Intervals:none   ST&T Change: None     Blake Divine, MD 09/27/22 [phone]

## 2022-09-27 NOTE — Progress Notes (Addendum)
Provider:  Tomasa Rand, MD  Location:  [facility] Room Number: 102-A Place of Service:  SNF [phone])  PCP: Dewayne Shorter, MD Patient Care Team: Dewayne Shorter, MD as PCP - General (Family Medicine) Theodore Demark, RN (Inactive) as Oncology Nurse Navigator Lauree Chandler, NP as Nurse Practitioner (Geriatric Medicine)  Extended Emergency Contact Information Primary Emergency Contact: Luhn,david Address: [address]          [place], [place] [address] Johnnette Litter of [place] Phone: [phone] Relation: Son  Code Status: DNR Goals of Care: Advanced Directive information    09/21/2022   11:25 AM  Advanced Directives  Does Patient Have a Medical Advance Directive? No  Would patient like information on creating a medical advance directive? No - Patient declined      Chief Complaint  Patient presents with   New Admit To SNF    New admission to [facility], skilled. Vitals and medications are a reflection of [facility] system, [facility]      HPI: Patient is a 88 y.o. female seen today for admission to [facility]. She had a fall at her home   She uses a walker and was in her kitchen. She turned too fast and had nothing to grab hold of to prevent fall.   This is her first and only fall of the year. She lives by herself. No more driving - stopped in 2019. She is from [facility] in assisted living. She gets support with medications. She bathes independently.   Her pain is well-controlled while sitting still or lying in bed. She has felt well except for when she is moving the surgical leg. Getting dressed and bathed is difficult as well. When she worked with PT it was difficult. Sliding over today was better today. Hoping for medication win  Her daughter and son in law are here.    Appetite has been stable.  She felt a little dizzy yesterday due to pain. In hospital had issues with confusion with higher doses of pain medication  and concerned about receiving any. Okay with trial of lower dose of pain medication to help with therapy.  Urination normal.   Goal is to get stronger. Okay either DPAL or here. Goal of using walker and maintain independence.    Past Medical History:  Diagnosis Date   Breast cancer ([place])    CKD (chronic kidney disease) stage 3, GFR 30-59 ml/min (HCC)    Displaced intertrochanteric fracture of right femur ([place])    Per [facility], [facility]   Former smoker    Heart disease    mitral valvular   Hyperlipidemia    Hyperparathyroidism, secondary ([place])    Hypertension    Hypo-osmolality and hyponatremia    Per [employer] EMR, [facility]   Hypothyroidism    Macular degeneration    Per [employer] EMR, [facility]   Sensorineural hearing loss    Spinal stenosis of lumbar region 2006   s/p surgery   UTI (urinary tract infection)    Per [facility] EMR, [facility]   Vitamin D deficiency    Per [facility] EMR, [facility]   Past Surgical History:  Procedure Laterality Date   ABDOMINAL HYSTERECTOMY     bilateral cataract surgery     BREAST LUMPECTOMY Right 07/22/2021   CHOLECYSTECTOMY  12/04/1978   FRACTURE SURGERY Left 12/29/2018   FEMUR  INTRAMEDULLARY (IM) NAIL INTERTROCHANTERIC Right 09/21/2022   Procedure: INTRAMEDULLARY (IM) NAIL INTERTROCHANTERIC;  Surgeon: Thornton Park, MD;  Location: [facility] ORS;  Service: Orthopedics;  Laterality: Right;   JOINT REPLACEMENT     Bilateral knee   LAPAROSCOPIC SIGMOID COLECTOMY  12/04/2009   secondary to benign mass   MELANOMA EXCISION WITH SENTINEL LYMPH NODE BIOPSY Right 07/22/2021   Procedure: MELANOMA EXCISION WITH SENTINEL LYMPH NODE BIOPSY;  Surgeon: Herbert Pun, MD;  Location: [facility] ORS;  Service: General;  Laterality: Right;   PART [place] NODE BIOPSY Right 07/22/2021   Procedure: PART MASTECTOMY,RADIO FREQUENCY LOCALIZER,AXILLARY SENTINEL NODE  BIOPSY;  Surgeon: Herbert Pun, MD;  Location: [facility] ORS;  Service: General;  Laterality: Right;   REPLACEMENT TOTAL KNEE BILATERAL     SMALL INTESTINE SURGERY  12/04/2008   for SBO   SPINE SURGERY  12/04/2004   [facility], [facility]   tendon repair     achille heall, left     reports that she quit smoking about 61 years ago. Her smoking use included cigarettes. She has never used smokeless tobacco. She reports that she does not currently use alcohol. She reports that she does not use drugs. Social History   Socioeconomic History   Marital status: Widowed    Spouse name: Not on file   Number of children: 5   Years of education: Not on file   Highest education level: Not on file  Occupational History   Occupation: Preschool teacher/director    Comment: Retired  Tobacco Use   Smoking status: Former    Types: Cigarettes    Quit date: 03/15/1961    Years since quitting: 61.5   Smokeless tobacco: Never   Tobacco comments:    Quit many years ago.  Vaping Use   Vaping Use: Never used  Substance and Sexual Activity   Alcohol use: Not Currently   Drug use: No   Sexual activity: Never  Other Topics Concern   Not on file  Social History Narrative   2 daughters/3 sons      Has living will   Son Shanon Brow is [facility] POA   Has DNR   Would accept hospital care   Would consider tube feeds--but not if persistent cognitive unawareness   Social Determinants of Health   Financial Resource Strain: Low Risk  (03/02/2022)   Overall Financial Resource Strain (CARDIA)    Difficulty of Paying Living Expenses: Not hard at all  Food Insecurity: No Food Insecurity (09/22/2022)   Hunger Vital Sign    Worried About Running Out of Food in the Last Year: Never true    Ran Out of Food in the Last Year: Never true  Transportation Needs: No Transportation Needs (09/22/2022)   PRAPARE - Hydrologist (Medical): No    Lack of Transportation (Non-Medical): No  Physical Activity:  Insufficiently Active (03/02/2022)   Exercise Vital Sign    Days of Exercise per Week: 2 days    Minutes of Exercise per Session: 60 min  Stress: No Stress Concern Present (03/02/2022)   [place]    Feeling of Stress : Not at all  Social Connections: Unknown (03/02/2022)   Social Connection and Isolation Panel [NHANES]    Frequency of Communication with Friends and Family: More than three times a week    Frequency of Social Gatherings with Friends and Family: Twice a week    Attends Religious Services: Not on file  Active Member of Clubs or Organizations: Yes    Attends Archivist Meetings: Not on file    Marital Status: Widowed  Intimate Partner Violence: Not At Risk (09/22/2022)   Humiliation, Afraid, Rape, and Kick questionnaire    Fear of Current or Ex-Partner: No    Emotionally Abused: No    Physically Abused: No    Sexually Abused: No    Functional Status Survey:    Family History  Problem Relation Age of Onset   Arthritis Mother    Stroke Mother    Hyperlipidemia Father    Cancer Father    Heart disease Paternal Aunt     Health Maintenance  Topic Date Due   COVID-19 Vaccine (4 - Moderna risk series) 10/03/2021   TETANUS/TDAP  02/15/2023   Medicare Annual Wellness (AWV)  04/02/2023   Pneumonia Vaccine 44+ Years old  Completed   INFLUENZA VACCINE  Completed   DEXA SCAN  Completed   Zoster Vaccines- Shingrix  Completed   HPV VACCINES  Aged Out    Allergies  Allergen Reactions   Bactrim [Sulfamethoxazole-Trimethoprim] Nausea And Vomiting    Lethargic and weakness    Outpatient Encounter Medications as of 09/27/2022  Medication Sig   acetaminophen (TYLENOL) 325 MG tablet Take 1-2 tablets (325-650 mg total) by mouth every 6 (six) hours as needed for mild pain (pain score 1-3 or temp > 100.5).   alum & mag hydroxide-simeth (MAALOX/MYLANTA) 200-200-20 MG/5ML suspension Take 30 mLs by  mouth every 4 (four) hours as needed for indigestion.   bisacodyl (DULCOLAX) 10 MG suppository Place 1 suppository (10 mg total) rectally daily as needed for moderate constipation.   Calcium Carbonate-Vitamin D3 600-400 MG-UNIT TABS Take 1 tablet by mouth 2 (two) times daily.   docusate sodium (COLACE) 100 MG capsule Take 1 capsule (100 mg total) by mouth 2 (two) times daily.   enoxaparin (LOVENOX) 30 MG/0.3ML injection Inject 0.3 mLs (30 mg total) into the skin daily for 14 days.   levothyroxine (SYNTHROID) 50 MCG tablet TAKE 1 TABLET EVERY DAY ON EMPTY STOMACHWITH A GLASS OF WATER AT LEAST 30-60 MINBEFORE BREAKFAST   Magnesium Oxide 250 MG TABS Take 1 tablet by mouth every other day. At bedtime   methocarbamol (ROBAXIN) 500 MG tablet Take 1 tablet (500 mg total) by mouth every 6 (six) hours as needed for muscle spasms.   Multiple Vitamins-Minerals (PRESERVISION AREDS PO) Take 1 capsule by mouth 2 (two) times daily.   nitrofurantoin (MACRODANTIN) 100 MG capsule Take 1 capsule (100 mg total) by mouth at bedtime for 7 days.   ondansetron (ZOFRAN) 4 MG tablet Take 1 tablet (4 mg total) by mouth every 6 (six) hours as needed for nausea.   oxyCODONE (ROXICODONE) 5 MG immediate release tablet Take 1 tablet (5 mg total) by mouth every 6 (six) hours as needed for up to 5 days for severe pain.   polyethylene glycol (MIRALAX / GLYCOLAX) 17 g packet Take 17 g by mouth daily as needed for mild constipation.   tamsulosin (FLOMAX) 0.4 MG CAPS capsule Take 1 capsule (0.4 mg total) by mouth daily.   No facility-administered encounter medications on file as of 09/27/2022.    Review of Systems  Vitals:   09/27/22 0907  BP: (!) 163/86  Pulse: 81  Temp: 98 F (36.7 C)  Weight: 187 lb (84.8 kg)  Height: '5\' 5"'$  (1.651 m)   Body mass index is 31.12 kg/m. Physical Exam  Labs reviewed: Basic Metabolic Panel: Recent  Labs    09/21/22 2149 09/22/22 0314 09/23/22 0445 09/24/22 0526 09/25/22 0304  NA  --     < > 126* 132* 132*  K  --    < > 3.9 4.2 4.0  CL  --    < > 96* 105 103  CO2  --    < > '24 24 25  '$ GLUCOSE  --    < > 119* 112* 95  BUN  --    < > '18 16 16  '$ CREATININE  --    < > 1.38* 1.26* 1.28*  CALCIUM  --    < > 7.6* 7.8* 7.7*  MG 1.6*  --   --   --   --   PHOS 2.9  --   --   --   --    < > = values in this interval not displayed.   Liver Function Tests: No results for input(s): "AST", "ALT", "ALKPHOS", "BILITOT", "PROT", "ALBUMIN" in the last 8760 hours. No results for input(s): "LIPASE", "AMYLASE" in the last 8760 hours. No results for input(s): "AMMONIA" in the last 8760 hours. CBC: Recent Labs    09/20/22 1807 09/22/22 0314 09/23/22 0445 09/24/22 0526 09/25/22 0304  WBC 8.5   < > 8.8 8.5 8.6  NEUTROABS 5.6  --   --   --   --   HGB 11.8*   < > 8.5* 8.1* 7.6*  HCT 35.4*   < > 24.4* 23.9* 22.3*  MCV 95.7   < > 91.4 92.3 94.1  PLT 247   < > 159 187 198   < > = values in this interval not displayed.   Cardiac Enzymes: No results for input(s): "CKTOTAL", "CKMB", "CKMBINDEX", "TROPONINI" in the last 8760 hours. BNP: Invalid input(s): "POCBNP" Lab Results  Component Value Date   HGBA1C 5.8 03/08/2020   Lab Results  Component Value Date   TSH 2.523 09/23/2022   No results found for: "VITAMINB12" No results found for: "FOLATE" Lab Results  Component Value Date   FERRITIN 119 01/12/2019    Imaging and Procedures obtained prior to SNF admission: CT Angio Chest Pulmonary Embolism (PE) W or WO Contrast  Result Date: 09/21/2022 CLINICAL DATA:  Concern for pulmonary embolism. EXAM: CT ANGIOGRAPHY CHEST WITH CONTRAST TECHNIQUE: Multidetector CT imaging of the chest was performed using the standard protocol during bolus administration of intravenous contrast. Multiplanar CT image reconstructions and MIPs were obtained to evaluate the vascular anatomy. RADIATION DOSE REDUCTION: This exam was performed according to the departmental dose-optimization program which  includes automated exposure control, adjustment of the mA and/or kV according to patient size and/or use of iterative reconstruction technique. CONTRAST:  71m OMNIPAQUE IOHEXOL 350 MG/ML SOLN COMPARISON:  Chest radiograph dated 09/21/2022. FINDINGS: Evaluation of this exam is limited due to respiratory motion artifact. Cardiovascular: There is no cardiomegaly or pericardial effusion. Three-vessel coronary vascular calcification. Mild atherosclerotic calcification of the thoracic aorta. No aneurysmal dilatation or dissection. Evaluation of the pulmonary arteries is limited due to respiratory motion. No pulmonary artery embolus identified. Mediastinum/Nodes: No hilar or mediastinal adenopathy. The esophagus is grossly unremarkable. There is a 12 mm left thyroid hypodense nodule. In the setting of significant comorbidities or limited life expectancy, no follow-up recommended (ref: J Am Coll Radiol. 2015 Feb;12(2): 143-50).No mediastinal fluid collection. Lungs/Pleura: Bibasilar subpleural scarring. No focal consolidation, pleural effusion, or pneumothorax. There is a 5 mm subpleural nodule in the superior segment of the left lower lobe (44/6). The central airways are  patent. Upper Abdomen: Cholecystectomy. Fatty liver. Partially visualized 4 cm left renal cyst. Musculoskeletal: Osteopenia degenerative changes of the spine. Age indeterminate, likely chronic compression fracture of superior endplate of T3 with approximately 30% loss of vertebral body height and anterior wedging. Correlation with clinical exam and point tenderness recommended. No definite acute osseous pathology. Review of the MIP images confirms the above findings. IMPRESSION: 1. No acute intrathoracic pathology. No CT evidence of pulmonary artery embolus. 2. A 5 mm subpleural nodule in the superior segment of the left lower lobe. No follow-up needed if patient is low-risk.This recommendation follows the consensus statement: Guidelines for Management of  Incidental Pulmonary Nodules Detected on CT Images: From the Fleischner Society 2017; Radiology 2017; 284:228-243. 3. Age indeterminate, likely chronic compression fracture of superior endplate of T3 with approximately 30% loss of vertebral body height and anterior wedging. Correlation with clinical exam and point tenderness recommended. 4. Fatty liver. 5.  Aortic Atherosclerosis (ICD10-I70.0). Electronically Signed   By: Anner Crete M.D.   On: 09/21/2022 22:43   DG Chest Port 1 View  Result Date: 09/21/2022 CLINICAL DATA:  Dyspnea EXAM: PORTABLE CHEST 1 VIEW COMPARISON:  Chest 09/20/2022 FINDINGS: The heart size and mediastinal contours are within normal limits. Both lungs are clear. The visualized skeletal structures are unremarkable. IMPRESSION: No active disease. Electronically Signed   By: Franchot Gallo M.D.   On: 09/21/2022 19:57   DG FEMUR, MIN 2 VIEWS RIGHT  Result Date: 09/21/2022 CLINICAL DATA:  Right hip fracture fixation. EXAM: RIGHT FEMUR 2 VIEWS COMPARISON:  Radiographs from yesterday. FINDINGS: There is a long intramedullary rod in the femur with a proximal dynamic hip screw transfixing the intertrochanteric fracture with anatomic reduction. Two distal interlocking screws are also noted. IMPRESSION: Internal fixation of intertrochanteric right hip fracture with anatomic reduction. Electronically Signed   By: Marijo Sanes M.D.   On: 09/21/2022 15:39   DG HIP UNILAT WITH PELVIS 2-3 VIEWS RIGHT  Result Date: 09/21/2022 CLINICAL DATA:  Elective surgery.  Right femoral intramedullary nail EXAM: DG HIP (WITH OR WITHOUT PELVIS) 2-3V RIGHT COMPARISON:  Right hip radiographs 09/20/2022 FINDINGS: Images were performed intraoperatively without the presence of a radiologist. The patient is undergoing long cephalomedullary nail fixation of the right femur. The patient is seen intertrochanteric fracture is now normally aligned. Total fluoroscopy images: 4 Total fluoroscopy time: 100 seconds  Total dose: Radiation Exposure Index (as provided by the fluoroscopic device): 20.92 mGy air Kerma Please see intraoperative findings for further detail. IMPRESSION: Intraoperative fluoroscopic guidance for long cephalomedullary nail fixation of the right femur. Electronically Signed   By: Yvonne Kendall M.D.   On: 09/21/2022 14:41   DG C-Arm 1-60 Min-No Report  Result Date: 09/21/2022 Fluoroscopy was utilized by the requesting physician.  No radiographic interpretation.   DG Chest 1 View  Result Date: 09/20/2022 CLINICAL DATA:  Fall EXAM: CHEST  1 VIEW COMPARISON:  09/27/2020 FINDINGS: Low lung volumes. No acute airspace disease or effusion. Upper normal cardiomediastinal silhouette augmented by low lung volume. No pneumothorax IMPRESSION: No active disease.  Low lung volume Electronically Signed   By: Donavan Foil M.D.   On: 09/20/2022 18:42   DG Hip Unilat W or Wo Pelvis 2-3 Views Right  Result Date: 09/20/2022 CLINICAL DATA:  Hip pain with fall EXAM: DG HIP (WITH OR WITHOUT PELVIS) 2-3V RIGHT COMPARISON:  None Available. FINDINGS: Hardware in the lower lumbar spine. Pubic symphysis and rami appear intact. Multiple clips in the pelvis. Acute comminuted intertrochanteric fracture  with displacement. No femoral head dislocation IMPRESSION: Acute comminuted and displaced right intertrochanteric fracture Electronically Signed   By: Donavan Foil M.D.   On: 09/20/2022 18:41    Assessment/Plan 1. Closed nondisplaced intertrochanteric fracture of right femur, initial encounter ([place]), 09/20/2022 Status post in intramedullary nail placement on after fracture on 09/21/2022.  Patient's pain is typically well controlled with Tylenol only, however with physical therapy she is noted significant pain.  During hospitalization had some confusion secondary to dosing of pain medication.  We will trial lower dose of oxycodone 2.5 mg every 6 as needed for pain.  Encouraged nursing to provide medication prior to  participation in therapy.  2. Advanced directives, counseling/discussion Discussed goals of care with the daughter and the patient and maintained CODE STATUS of DNR.  If patient does decompensate open to transition to the hospital for stabilization.  3. Hypertension, unspecified type BP well controlled without treatment at this time.  4. Hyponatremia Noted to have hyponatremia during hospitalization stable chronically low at 132.  Repeat BMP tomorrow  5. Hypothyroidism, unspecified type Most recent TSH was in goal range continue Synthroid 50 mcg daily.  6. Macular degeneration (senile) of retina Patient with dry macular degeneration continue PreserVision daily.  7. Acute postoperative anemia due to expected blood loss Hemoglobin most recently 7.4.  Patient received during hospitalization.  We will plan for CBC tomorrow.    Family/ staff Communication: Daughter and nursing  Labs/tests ordered: CBC, BMP, iron levels.  Tomasa Rand, MD, [facility] [phone]

## 2022-09-27 NOTE — Discharge Instructions (Addendum)
Your symptoms today were the result of an irregular heart rhythm called atrial fibrillation.  You have since converted to a normal heart rhythm, but you will need to start taking a medication called diltiazem once daily to control your heart rate and to prevent this rhythm from happening again.  You will also need to start taking Eliquis, which is a blood thinner, to lower your stroke risk.  You should stop taking Lovenox and switch to Eliquis instead.  You will need to schedule follow-up with a cardiologist in about 1 week, in the meantime you should return to the ER if you experience any new or worsening symptoms.

## 2022-09-27 NOTE — ED Triage Notes (Signed)
Pt. To ED from [facility] via EMS for shortness of breath and neck pain. Pt. Had right hip replacement Monday. Pt. States SOB is intermittently. EMS states pt. Was in and out of A fib en route. Pt. Denies n/v/d or fever.

## 2022-09-27 NOTE — ED Notes (Signed)
Rad at bedside.

## 2022-09-28 ENCOUNTER — Encounter: Payer: Self-pay | Admitting: Student

## 2022-09-28 LAB — BASIC METABOLIC PANEL
BUN: 20 (ref 4–21)
CO2: 26 — AB (ref 13–22)
Chloride: 102 (ref 99–108)
Creatinine: 1.3 — AB (ref 0.5–1.1)
Glucose: 98
Potassium: 4.2 mEq/L (ref 3.5–5.1)
Sodium: 135 — AB (ref 137–147)

## 2022-09-28 LAB — COMPREHENSIVE METABOLIC PANEL: Calcium: 8.6 — AB (ref 8.7–10.7)

## 2022-09-28 LAB — CBC AND DIFFERENTIAL
HCT: 24 — AB (ref 36–46)
Hemoglobin: 7.9 — AB (ref 12.0–16.0)
Neutrophils Absolute: 4851
Platelets: 341 10*3/uL (ref 150–400)
WBC: 7.7

## 2022-09-28 LAB — CBC: RBC: 2.49 — AB (ref 3.87–5.11)

## 2022-09-28 MED ORDER — OXYCODONE HCL 5 MG PO TABS: 2.5000 mg | ORAL_TABLET | Freq: Four times a day (QID) | ORAL | 0 refills | Status: DC | PRN

## 2022-09-28 NOTE — Progress Notes (Signed)
Received call as on call provider that patient had a drop in the blood pressure to 80s over 60s, short of breath and audible over telephone call.  We will plan for patient to transfer to emergency department for evaluation and stabilization given recent surgery and anemia concern for cardiac causes for decompensation.

## 2022-09-29 ENCOUNTER — Encounter: Payer: Self-pay | Admitting: Student

## 2022-09-29 ENCOUNTER — Non-Acute Institutional Stay (SKILLED_NURSING_FACILITY): Payer: Medicare HMO | Admitting: Student

## 2022-09-29 DIAGNOSIS — I4891 Unspecified atrial fibrillation: Secondary | ICD-10-CM

## 2022-09-29 NOTE — Progress Notes (Unsigned)
Location:  [facility] Room Number: 102-A Place of Service:  SNF [phone]) Provider: Tomasa Rand, M.D.  Code Status: DNR Goals of Care:     09/27/2022    2:42 PM  Advanced Directives  Does Patient Have a Medical Advance Directive? Yes  Would patient like information on creating a medical advance directive? No - Patient declined     Chief Complaint  Patient presents with   Follow-up    ED follow-up     HPI: Patient is a 88 y.o. female seen today for hospital follow-up s/p admission from   Past Medical History:  Diagnosis Date   Breast cancer ([place])    CKD (chronic kidney disease) stage 3, GFR 30-59 ml/min ([place])    Displaced intertrochanteric fracture of right femur ([place])    Per [facility] EMR, [employer] [facility]   Former smoker    Heart disease    mitral valvular   Hyperlipidemia    Hyperparathyroidism, secondary ([place])    Hypertension    Hypo-osmolality and hyponatremia    Per [employer] EMR, [employer] [facility]   Hypothyroidism    Macular degeneration    Per [employer] EMR, [employer] [facility]   Sensorineural hearing loss    Spinal stenosis of lumbar region 2006   s/p surgery   UTI (urinary tract infection)    Per [facility] EMR, [employer] [facility]   Vitamin D deficiency    Per [facility] EMR, [employer] [facility]    Past Surgical History:  Procedure Laterality Date   ABDOMINAL HYSTERECTOMY     bilateral cataract surgery     BREAST LUMPECTOMY Right 07/22/2021   CHOLECYSTECTOMY  12/04/1978   FRACTURE SURGERY Left 12/29/2018   FEMUR   INTRAMEDULLARY (IM) NAIL INTERTROCHANTERIC Right 09/21/2022   Procedure: INTRAMEDULLARY (IM) NAIL INTERTROCHANTERIC;  Surgeon: Thornton Park, MD;  Location: [facility] ORS;  Service: Orthopedics;  Laterality: Right;   JOINT REPLACEMENT     Bilateral knee   LAPAROSCOPIC SIGMOID COLECTOMY  12/04/2009   secondary to benign mass   MELANOMA EXCISION WITH SENTINEL LYMPH NODE BIOPSY Right 07/22/2021   Procedure: MELANOMA  EXCISION WITH SENTINEL LYMPH NODE BIOPSY;  Surgeon: Herbert Pun, MD;  Location: [facility] ORS;  Service: General;  Laterality: Right;   PART [place] NODE BIOPSY Right 07/22/2021   Procedure: PART MASTECTOMY,RADIO FREQUENCY LOCALIZER,AXILLARY SENTINEL NODE BIOPSY;  Surgeon: Herbert Pun, MD;  Location: [facility] ORS;  Service: General;  Laterality: Right;   REPLACEMENT TOTAL KNEE BILATERAL     SMALL INTESTINE SURGERY  12/04/2008   for SBO   SPINE SURGERY  12/04/2004   [place], [facility]   tendon repair     achille heall, left     Allergies  Allergen Reactions   Bactrim [Sulfamethoxazole-Trimethoprim] Nausea And Vomiting    Lethargic and weakness    Outpatient Encounter Medications as of 09/29/2022  Medication Sig   acetaminophen (TYLENOL) 325 MG tablet Take 1-2 tablets (325-650 mg total) by mouth every 6 (six) hours as needed for mild pain (pain score 1-3 or temp > 100.5).   alum & mag hydroxide-simeth (MAALOX/MYLANTA) 200-200-20 MG/5ML suspension Take 30 mLs by mouth every 4 (four) hours as needed for indigestion.   apixaban (ELIQUIS) 5 MG TABS tablet Take 1 tablet (5 mg total) by mouth 2 (two) times daily.   bisacodyl (DULCOLAX) 10 MG suppository Place 1 suppository (10 mg total) rectally daily as needed for moderate constipation.   Calcium Carbonate-Vitamin  D3 600-400 MG-UNIT TABS Take 1 tablet by mouth 2 (two) times daily.   diltiazem (CARDIZEM CD) 120 MG 24 hr capsule Take 1 capsule (120 mg total) by mouth daily.   docusate sodium (COLACE) 100 MG capsule Take 1 capsule (100 mg total) by mouth 2 (two) times daily.   levothyroxine (SYNTHROID) 50 MCG tablet TAKE 1 TABLET EVERY DAY ON EMPTY STOMACHWITH A GLASS OF WATER AT LEAST 30-60 MINBEFORE BREAKFAST   Magnesium Oxide 250 MG TABS Take 1 tablet by mouth every other day. At bedtime   Multiple Vitamins-Minerals (PRESERVISION AREDS PO) Take 1 capsule by mouth 2 (two) times daily.    nitrofurantoin (MACRODANTIN) 100 MG capsule Take 1 capsule (100 mg total) by mouth at bedtime for 7 days.   ondansetron (ZOFRAN) 4 MG tablet Take 1 tablet (4 mg total) by mouth every 6 (six) hours as needed for nausea.   oxyCODONE (OXY IR/ROXICODONE) 5 MG immediate release tablet Take 5 mg by mouth every 6 (six) hours as needed for severe pain.   oxyCODONE (OXY IR/ROXICODONE) 5 MG immediate release tablet Take 2.5 mg by mouth daily as needed for severe pain. X 14 days, see other listing   polyethylene glycol (MIRALAX / GLYCOLAX) 17 g packet Take 17 g by mouth daily as needed for mild constipation.   tamsulosin (FLOMAX) 0.4 MG CAPS capsule Take 1 capsule (0.4 mg total) by mouth daily.   [DISCONTINUED] methocarbamol (ROBAXIN) 500 MG tablet Take 1 tablet (500 mg total) by mouth every 6 (six) hours as needed for muscle spasms.   [DISCONTINUED] oxyCODONE (ROXICODONE) 5 MG immediate release tablet Take 0.5 tablets (2.5 mg total) by mouth every 6 (six) hours as needed for up to 10 days for severe pain.   No facility-administered encounter medications on file as of 09/29/2022.    Review of Systems:  Review of Systems  Health Maintenance  Topic Date Due   COVID-19 Vaccine (4 - Moderna risk series) 10/03/2021   TETANUS/TDAP  02/15/2023   Medicare Annual Wellness (AWV)  03/03/2023   Pneumonia Vaccine 57+ Years old  Completed   INFLUENZA VACCINE  Completed   DEXA SCAN  Completed   Zoster Vaccines- Shingrix  Completed   HPV VACCINES  Aged Out    Physical Exam: Vitals:   09/29/22 1355  BP: 122/67  Pulse: 81  Resp: 16  Temp: (!) 96.3 F (35.7 C)  SpO2: 98%  Weight: 187 lb (84.8 kg)  Height: '5\' 5"'$  (1.651 m)   Body mass index is 31.12 kg/m. Physical Exam  Labs reviewed: Basic Metabolic Panel: Recent Labs    09/21/22 2149 09/22/22 0314 09/23/22 0445 09/24/22 0526 09/25/22 0304 09/27/22 1446 09/28/22 0000  NA  --    < > 126* 132* 132* 135 135*  K  --    < > 3.9 4.2 4.0 3.8 4.2   CL  --    < > 96* 105 103 101 102  CO2  --    < > '24 24 25 25 '$ 26*  GLUCOSE  --    < > 119* 112* 95 117*  --   BUN  --    < > '18 16 16 18 20  '$ CREATININE  --    < > 1.38* 1.26* 1.28* 1.34* 1.3*  CALCIUM  --    < > 7.6* 7.8* 7.7* 9.2 8.6*  MG 1.6*  --   --   --   --  2.0  --   PHOS 2.9  --   --   --   --   --   --  TSH  --   --  2.523  --   --   --   --    < > = values in this interval not displayed.   Liver Function Tests: Recent Labs    09/27/22 1446  AST 40  ALT 23  ALKPHOS 78  BILITOT 2.2*  PROT 6.8  ALBUMIN 3.3*   No results for input(s): "LIPASE", "AMYLASE" in the last 8760 hours. No results for input(s): "AMMONIA" in the last 8760 hours. CBC: Recent Labs    09/20/22 1807 09/22/22 0314 09/24/22 0526 09/25/22 0304 09/27/22 1446 09/28/22 0000  WBC 8.5   < > 8.5 8.6 9.0 7.7  NEUTROABS 5.6  --   --   --  6.1 4,851.00  HGB 11.8*   < > 8.1* 7.6* 9.0* 7.9*  HCT 35.4*   < > 23.9* 22.3* 27.7* 24*  MCV 95.7   < > 92.3 94.1 96.5  --   PLT 247   < > 187 198 337 341   < > = values in this interval not displayed.   Lipid Panel: No results for input(s): "CHOL", "HDL", "LDLCALC", "TRIG", "CHOLHDL", "LDLDIRECT" in the last 8760 hours. Lab Results  Component Value Date   HGBA1C 5.8 03/08/2020    Procedures since last visit: US Venous Img Lower Bilateral  Result Date: 09/27/2022 CLINICAL DATA:  post op eval for dvt EXAM: BILATERAL LOWER EXTREMITY VENOUS DOPPLER ULTRASOUND TECHNIQUE: Gray-scale sonography with graded compression, as well as color Doppler and duplex ultrasound were performed to evaluate the lower extremity deep venous systems from the level of the common femoral vein and including the common femoral, femoral, profunda femoral, popliteal and calf veins including the posterior tibial, peroneal and gastrocnemius veins when visible. The superficial great saphenous vein was also interrogated. Spectral Doppler was utilized to evaluate flow at rest and with distal  augmentation maneuvers in the common femoral, femoral and popliteal veins. COMPARISON:  None Available. FINDINGS: RIGHT LOWER EXTREMITY Common Femoral Vein: No evidence of thrombus. Normal compressibility, respiratory phasicity and response to augmentation. Saphenofemoral Junction: No evidence of thrombus. Normal compressibility and flow on color Doppler imaging. Profunda Femoral Vein: No evidence of thrombus. Normal compressibility and flow on color Doppler imaging. Femoral Vein: No evidence of thrombus. Normal compressibility, respiratory phasicity and response to augmentation. Popliteal Vein: No evidence of thrombus. Normal compressibility, respiratory phasicity and response to augmentation. Calf Veins: No evidence of thrombus. Normal compressibility and flow on color Doppler imaging. LEFT LOWER EXTREMITY Common Femoral Vein: No evidence of thrombus. Normal compressibility, respiratory phasicity and response to augmentation. Saphenofemoral Junction: No evidence of thrombus. Normal compressibility and flow on color Doppler imaging. Profunda Femoral Vein: No evidence of thrombus. Normal compressibility and flow on color Doppler imaging. Femoral Vein: No evidence of thrombus. Normal compressibility, respiratory phasicity and response to augmentation. Popliteal Vein: No evidence of thrombus. Normal compressibility, respiratory phasicity and response to augmentation. Calf Veins: No evidence of thrombus. Normal compressibility and flow on color Doppler imaging. IMPRESSION: No evidence of deep venous thrombosis in either lower extremity. Electronically Signed   By: Margaretha Sheffield M.D.   On: 09/27/2022 16:40   DG Chest Portable 1 View  Result Date: 09/27/2022 CLINICAL DATA:  Short of breath, neck pain EXAM: PORTABLE CHEST 1 VIEW COMPARISON:  09/21/2022 FINDINGS: Single frontal view of the chest demonstrates a stable cardiac silhouette. Mild increased central vascular congestion without airspace disease, effusion,  or pneumothorax. There are no acute bony abnormalities. IMPRESSION: 1. Increased central vascular  congestion without overt edema. Electronically Signed   By: Randa Ngo M.D.   On: 09/27/2022 15:26    Assessment/Plan There are no diagnoses linked to this encounter.   Labs/tests ordered:  * No order type specified * Next appt:  Visit date not found

## 2022-10-01 ENCOUNTER — Encounter: Payer: Self-pay | Admitting: Student

## 2022-10-01 DIAGNOSIS — I480 Paroxysmal atrial fibrillation: Secondary | ICD-10-CM | POA: Insufficient documentation

## 2022-10-01 DIAGNOSIS — I4891 Unspecified atrial fibrillation: Secondary | ICD-10-CM | POA: Insufficient documentation

## 2022-10-02 ENCOUNTER — Telehealth (SKILLED_NURSING_FACILITY): Payer: Medicare HMO | Admitting: Student

## 2022-10-02 ENCOUNTER — Ambulatory Visit (INDEPENDENT_AMBULATORY_CARE_PROVIDER_SITE_OTHER): Payer: Medicare HMO

## 2022-10-02 ENCOUNTER — Ambulatory Visit: Payer: Medicare HMO | Attending: Cardiology | Admitting: Cardiology

## 2022-10-02 ENCOUNTER — Encounter: Payer: Self-pay | Admitting: Cardiology

## 2022-10-02 VITALS — BP 118/56 | HR 79 | Wt 187.0 lb

## 2022-10-02 DIAGNOSIS — S72144A Nondisplaced intertrochanteric fracture of right femur, initial encounter for closed fracture: Secondary | ICD-10-CM | POA: Diagnosis not present

## 2022-10-02 DIAGNOSIS — I480 Paroxysmal atrial fibrillation: Secondary | ICD-10-CM | POA: Diagnosis not present

## 2022-10-02 DIAGNOSIS — R339 Retention of urine, unspecified: Secondary | ICD-10-CM

## 2022-10-02 DIAGNOSIS — I10 Essential (primary) hypertension: Secondary | ICD-10-CM | POA: Diagnosis not present

## 2022-10-02 DIAGNOSIS — R062 Wheezing: Secondary | ICD-10-CM | POA: Diagnosis not present

## 2022-10-02 DIAGNOSIS — D62 Acute posthemorrhagic anemia: Secondary | ICD-10-CM | POA: Diagnosis not present

## 2022-10-02 DIAGNOSIS — I951 Orthostatic hypotension: Secondary | ICD-10-CM

## 2022-10-02 MED ORDER — FUROSEMIDE 20 MG PO TABS: 20.0000 mg | ORAL_TABLET | Freq: Every day | ORAL | 0 refills | Status: DC

## 2022-10-02 MED ORDER — OXYCODONE HCL 5 MG PO TABS: 2.5000 mg | ORAL_TABLET | Freq: Four times a day (QID) | ORAL | 0 refills | Status: AC | PRN

## 2022-10-02 NOTE — Progress Notes (Signed)
Cardiology Office Note:    Date:  10/02/2022   ID:  Pamela Paul, DOB 05-02-1926, MRN [id number]  PCP:  Dewayne Shorter, [place] Prov[facility] Cardiologist:  Kate Sable, MD     Referring MD: Dewayne Shorter, MD   Chief Complaint  Patient presents with   Follow-up    Hospital F/U, Afib    History of Present Illness:    Pamela Paul is a 88 y.o. female with a hx of  hypertension, paroxysmal atrial fibrillation, CKD 3 who presents to establish care due to history of A-fib.  Patient lives in an assisted living facility/[facility], evaluated in the emergency room 5 days ago with symptoms of shortness of breath.  Had right hip replacement 10 days ago.  Was walking too fast leading to a fall.  On route to the ED per EMS, she was noted to be in and out of atrial fibrillation.  In the ED, EKG showed A-fib RVR heart rate 152.  She was started on Cardizem drip with conversion to sinus rhythm.  Symptoms resolved completely while in sinus rhythm.  ED called myself, Cardizem and Eliquis recommended.  She was transported back to nursing facility.  TSH 1-2 weeks ago was normal.  Has felt well since ED visit.  Family has noted occasional cough and mild wheezing which always happens after she has surgery.  Past Medical History:  Diagnosis Date   Breast cancer ([place])    CKD (chronic kidney disease) stage 3, GFR 30-59 ml/min (HCC)    Displaced intertrochanteric fracture of right femur ([place])    Per [facility] EMR, [employer] [facility]   Former smoker    Heart disease    mitral valvular   Hyperlipidemia    Hyperparathyroidism, secondary ([place])    Hypertension    Hypo-osmolality and hyponatremia    Per [facility] EMR, [employer] [facility]   Hypothyroidism    Macular degeneration    Per [employer] EMR, [employer] [facility]   Sensorineural hearing loss    Spinal stenosis of lumbar region 2006   s/p surgery   UTI (urinary tract infection)    Per [facility] EMR, [employer] [facility]    Vitamin D deficiency    Per [facility] EMR, [employer]    Past Surgical History:  Procedure Laterality Date   ABDOMINAL HYSTERECTOMY     bilateral cataract surgery     BREAST LUMPECTOMY Right 07/22/2021   CHOLECYSTECTOMY  12/04/1978   FRACTURE SURGERY Left 12/29/2018   FEMUR   INTRAMEDULLARY (IM) NAIL INTERTROCHANTERIC Right 09/21/2022   Procedure: INTRAMEDULLARY (IM) NAIL INTERTROCHANTERIC;  Surgeon: Thornton Park, MD;  Location: [facility] ORS;  Service: Orthopedics;  Laterality: Right;   JOINT REPLACEMENT     Bilateral knee   LAPAROSCOPIC SIGMOID COLECTOMY  12/04/2009   secondary to benign mass   MELANOMA EXCISION WITH SENTINEL LYMPH NODE BIOPSY Right 07/22/2021   Procedure: MELANOMA EXCISION WITH SENTINEL LYMPH NODE BIOPSY;  Surgeon: Herbert Pun, MD;  Location: [facility] ORS;  Service: General;  Laterality: Right;   PART [place] NODE BIOPSY Right 07/22/2021   Procedure: PART MASTECTOMY,RADIO FREQUENCY LOCALIZER,AXILLARY SENTINEL NODE BIOPSY;  Surgeon: Herbert Pun, MD;  Location: [facility] ORS;  Service: General;  Laterality: Right;   REPLACEMENT TOTAL KNEE BILATERAL     SMALL INTESTINE SURGERY  12/04/2008   for SBO   SPINE SURGERY  12/04/2004   [place], [facility]   tendon repair     achille heall,  left     Current Medications: Current Meds  Medication Sig   acetaminophen (TYLENOL) 325 MG tablet Take 1-2 tablets (325-650 mg total) by mouth every 6 (six) hours as needed for mild pain (pain score 1-3 or temp > 100.5).   alum & mag hydroxide-simeth (MAALOX/MYLANTA) 200-200-20 MG/5ML suspension Take 30 mLs by mouth every 4 (four) hours as needed for indigestion.   apixaban (ELIQUIS) 5 MG TABS tablet Take 1 tablet (5 mg total) by mouth 2 (two) times daily.   bisacodyl (DULCOLAX) 10 MG suppository Place 1 suppository (10 mg total) rectally daily as needed for moderate constipation.   Calcium Carbonate-Vitamin D3 600-400 MG-UNIT  TABS Take 1 tablet by mouth 2 (two) times daily.   diltiazem (CARDIZEM CD) 120 MG 24 hr capsule Take 1 capsule (120 mg total) by mouth daily.   furosemide (LASIX) 20 MG tablet Take 1 tablet (20 mg total) by mouth daily. Take for 5 days, and then let our office know if your symptoms have improved.   levothyroxine (SYNTHROID) 50 MCG tablet TAKE 1 TABLET EVERY DAY ON EMPTY STOMACHWITH A GLASS OF WATER AT LEAST 30-60 MINBEFORE BREAKFAST   Magnesium Oxide 250 MG TABS Take 1 tablet by mouth every other day. At bedtime   ondansetron (ZOFRAN) 4 MG tablet Take 1 tablet (4 mg total) by mouth every 6 (six) hours as needed for nausea.   polyethylene glycol (MIRALAX / GLYCOLAX) 17 g packet Take 17 g by mouth daily as needed for mild constipation.   tamsulosin (FLOMAX) 0.4 MG CAPS capsule Take 1 capsule (0.4 mg total) by mouth daily.   tuberculin 5 UNIT/0.1ML injection Inject 0.1 mLs into the skin once.   [DISCONTINUED] oxyCODONE (OXY IR/ROXICODONE) 5 MG immediate release tablet Take 5 mg by mouth every 6 (six) hours as needed for severe pain.   [DISCONTINUED] oxyCODONE (OXY IR/ROXICODONE) 5 MG immediate release tablet Take 2.5 mg by mouth daily as needed for severe pain. X 14 days, see other listing     Allergies:   Bactrim [sulfamethoxazole-trimethoprim]   Social History   Socioeconomic History   Marital status: Widowed    Spouse name: Not on file   Number of children: 5   Years of education: Not on file   Highest education level: Not on file  Occupational History   Occupation: Preschool teacher/director    Comment: Retired  Tobacco Use   Smoking status: Former    Types: Cigarettes    Quit date: 03/15/1961    Years since quitting: 61.5   Smokeless tobacco: Never   Tobacco comments:    Quit many years ago.  Vaping Use   Vaping Use: Never used  Substance and Sexual Activity   Alcohol use: Not Currently   Drug use: No   Sexual activity: Never  Other Topics Concern   Not on file  Social  History Narrative   2 daughters/3 sons      Has living will   Son Shanon Brow is [facility] POA   Has DNR   Would accept hospital care   Would consider tube feeds--but not if persistent cognitive unawareness   Social Determinants of Health   Financial Resource Strain: Low Risk  (03/02/2022)   Overall Financial Resource Strain (CARDIA)    Difficulty of Paying Living Expenses: Not hard at all  Food Insecurity: No Food Insecurity (09/22/2022)   Hunger Vital Sign    Worried About Running Out of Food in the Last Year: Never true    Ran Out  of Food in the Last Year: Never true  Transportation Needs: No Transportation Needs (09/22/2022)   PRAPARE - Hydrologist (Medical): No    Lack of Transportation (Non-Medical): No  Physical Activity: Insufficiently Active (03/02/2022)   Exercise Vital Sign    Days of Exercise per Week: 2 days    Minutes of Exercise per Session: 60 min  Stress: No Stress Concern Present (03/02/2022)   [place]    Feeling of Stress : Not at all  Social Connections: Unknown (03/02/2022)   Social Connection and Isolation Panel [NHANES]    Frequency of Communication with Friends and Family: More than three times a week    Frequency of Social Gatherings with Friends and Family: Twice a week    Attends Religious Services: Not on Advertising copywriter or Organizations: Yes    Attends Archivist Meetings: Not on file    Marital Status: Widowed     Family History: The patient's family history includes Arthritis in her mother; Cancer in her father; Heart disease in her paternal aunt; Hyperlipidemia in her father; Stroke in her mother.  ROS:   Please see the history of present illness.     All other systems reviewed and are negative.  EKGs/Labs/Other Studies Reviewed:    The following studies were reviewed today:   EKG:  EKG is  ordered today.  The ekg ordered today  demonstrates sinus rhythm, heart rate 79  Recent Labs: 09/23/2022: TSH 2.523 09/27/2022: ALT 23; Magnesium 2.0 09/28/2022: BUN 20; Creatinine 1.3; Hemoglobin 7.9; Platelets 341; Potassium 4.2; Sodium 135  Recent Lipid Panel    Component Value Date/Time   CHOL 213 (H) 08/17/2021 1042   TRIG 377 (H) 08/17/2021 1042   HDL 45 (L) 08/17/2021 1042   CHOLHDL 4.7 08/17/2021 1042   VLDL 34.4 07/31/2018 0823   LDLCALC 114 (H) 08/17/2021 1042   LDLDIRECT 132.0 07/12/2017 0837     Risk Assessment/Calculations:             Physical Exam:    VS:  BP (!) 118/56 (BP Location: Right Arm, Patient Position: Sitting, Cuff Size: Normal)   Pulse 79   Wt 187 lb (84.8 kg)   SpO2 92%   BMI 31.12 kg/m     Wt Readings from Last 3 Encounters:  10/02/22 187 lb (84.8 kg)  09/29/22 187 lb (84.8 kg)  09/27/22 198 lb 3.1 oz (89.9 kg)     GEN:  Well nourished, well developed in no acute distress HEENT: Normal NECK: No JVD; No carotid bruits CARDIAC: RRR, no murmurs, rubs, gallops RESPIRATORY: Expiratory wheezing ABDOMEN: Soft, non-tender, non-distended MUSCULOSKELETAL: Trace edema; No deformity  SKIN: Warm and dry NEUROLOGIC:  Alert and oriented x 3 PSYCHIATRIC:  Normal affect   ASSESSMENT:    1. Paroxysmal atrial fibrillation (HCC)   2. Primary hypertension   3. Diffuse wheezing    PLAN:    In order of problems listed above:  Paroxysmal atrial fibrillation, maintaining sinus rhythm.  Continue Cardizem 120 mg, Eliquis 5 mg twice daily.  Get echocardiogram, place cardiac monitor. Hypertension, BP controlled.  Continue Cardizem. Wheezing on exam, trace edema.  Start Lasix 20 mg daily x5 days.  If symptoms improve, will consider standing dose of Lasix.  Echo as above.  Follow-up with PCP to rule out pulmonary etiology.  Follow-up after cardiac monitor and echo      Medication Adjustments/Labs  and Tests Ordered: Current medicines are reviewed at length with the patient today.  Concerns  regarding medicines are outlined above.  Orders Placed This Encounter  Procedures   LONG TERM MONITOR (3-14 DAYS)   EKG 12-Lead   ECHOCARDIOGRAM COMPLETE   Meds ordered this encounter  Medications   furosemide (LASIX) 20 MG tablet    Sig: Take 1 tablet (20 mg total) by mouth daily. Take for 5 days, and then let our office know if your symptoms have improved.    Dispense:  30 tablet    Refill:  0    Patient Instructions  Medication Instructions:   Your physician has recommended you make the following change in your medication:    START taking Furosemide (Lasix) 20 MG once a day for 5 days, and then let us know if that has helped with your wheezing and shortness of breath.  Office number  [phone]   *If you need a refill on your cardiac medications before your next appointment, please call your pharmacy*   Testing/Procedures:   Your physician has requested that you have an echocardiogram. Echocardiography is a painless test that uses sound waves to create images of your heart. It provides your doctor with information about the size and shape of your heart and how well your heart's chambers and valves are working. This procedure takes approximately one hour. There are no restrictions for this procedure. Please do NOT wear cologne, perfume, aftershave, or lotions (deodorant is allowed). Please arrive 15 minutes prior to your appointment time.   2.    Your physician has recommended that you wear a Zio XT monitor for 2 weeks. This will be mailed to your home address in 4-5 business days.   Your clinician has requested a Zio heart rhythm monitor by [employer] to be mailed to your home for you to wear for 14 days. You should expect a small box to arrive via USPS (or FedEx in some cases) within this next week. If you do not receive it please call [employer] at [phone].  Closely watching your heart at this time will help your care team understand more and provide information needed  to develop your plan of care.  Please apply your Zio patch monitor the day you receive it. Keep this packaging, you will use this to return your Zio monitor.  You will easily be able to apply the monitor with the instructions provided in the Patient Guide.  If you need assistance, [employer] representatives are available 24/7 at [phone].  You can also download the [facility] app on your phone to view detailed application instructions and log symptoms.  After you wear your monitor for 14 days, place it back in the blue box or envelope, along with your Symptom Log.  To send your monitor back: Simply use the pre-addressed and pre-paid box/envelope.  Send it back through [employer] the same day you remove it via your local post office or by placing it in your mailbox.  As soon as we receive the results, they will be reviewed and your clinician will contact you.  For the first 24 hours- it is essential to not shower or exercise, to allow the patch to adhere to your skin. Avoid excessive sweating to help maximize wear time. Do not submerge the device, no hot tubs, and no swimming pools. Keep any lotions or oils away from the patch. After 24 hours you may shower with the patch on. Take brief showers with your back facing the shower  head.  Do not remove patch once it has been placed because that will interrupt data and decrease adhesive wear time. Push the button when you have any symptoms and write down what you were feeling. Once you have completed wearing your monitor, remove and place into box which has postage paid and place in your outgoing mailbox.  If for some reason you have misplaced your box then call our office and we can provide another box and/or mail it off for you.     Follow-Up: At [facility], you and your health needs are our priority.  As part of our continuing mission to provide you with exceptional heart care, we have created designated Provider Care Teams.   These Care Teams include your primary Cardiologist (physician) and Advanced Practice Providers (APPs -  Physician Assistants and Nurse Practitioners) who all work together to provide you with the care you need, when you need it.  We recommend signing up for the patient portal called "MyChart".  Sign up information is provided on this After Visit Summary.  MyChart is used to connect with patients for Virtual Visits (Telemedicine).  Patients are able to view lab/test results, encounter notes, upcoming appointments, etc.  Non-urgent messages can be sent to your provider as well.   To learn more about what you can do with MyChart, go to [email or web address].    Your next appointment:   6-8 week(s)  The format for your next appointment:   In Person  Provider:   You may see Kate Sable, MD or one of the following Advanced Practice Providers on your designated Care Team:   Murray Hodgkins, NP Christell Faith, PA-C Cadence Kathlen Mody, PA-C Gerrie Nordmann, NP    Other Instructions   Important Information About Sugar         Signed, Kate Sable, MD  10/02/2022 3:23 PM    [place]

## 2022-10-02 NOTE — Telephone Encounter (Addendum)
[facility]: [facility]  Spoke with patient's son, Waunita Schooner, on the telephone regarding questions asked in private message. Patient's pain control with therapy - messaged physical therapist and nurse to help coordinate to make sure that pain is well-controlled prior to working with PT. Patient has had some drops in blood pressure with therapy. No BP meds. Recently started Flomax for urinary retention and diltiazem for atrial fibrillation. Discussed with patient's son that this should improve once she is off of Flomax later this week. Encourage good hydration and compression stockings for blood pressure. He also has concerns about the RBC's patient has. Discussed importance of good protein intake and iron levels given patients low hemoglobin. Discussed life-span of RBC's and that it could take a few months to replete given HGB was <7 during hospitalization. Discussed checking iron levels and CBC in 3 months and if no improvement, can discuss a referral to hematology and oncology for iron infusion if aligned with goals of care. Patient's fracture occurred on 09/21/2022.  All questions an concerns addressed.   I spent 16.5 minutes discussing care plan, education, care management, and chart review.   Tomasa Rand, MD, [facility] [phone]

## 2022-10-02 NOTE — Patient Instructions (Signed)
Medication Instructions:   Your physician has recommended you make the following change in your medication:    START taking Furosemide (Lasix) 20 MG once a day for 5 days, and then let us know if that has helped with your wheezing and shortness of breath.  Office number  [phone]   *If you need a refill on your cardiac medications before your next appointment, please call your pharmacy*   Testing/Procedures:   Your physician has requested that you have an echocardiogram. Echocardiography is a painless test that uses sound waves to create images of your heart. It provides your doctor with information about the size and shape of your heart and how well your heart's chambers and valves are working. This procedure takes approximately one hour. There are no restrictions for this procedure. Please do NOT wear cologne, perfume, aftershave, or lotions (deodorant is allowed). Please arrive 15 minutes prior to your appointment time.   2.    Your physician has recommended that you wear a Zio XT monitor for 2 weeks. This will be mailed to your home address in 4-5 business days.   Your clinician has requested a Zio heart rhythm monitor by [employer] to be mailed to your home for you to wear for 14 days. You should expect a small box to arrive via USPS (or FedEx in some cases) within this next week. If you do not receive it please call [employer] at [phone].  Closely watching your heart at this time will help your care team understand more and provide information needed to develop your plan of care.  Please apply your Zio patch monitor the day you receive it. Keep this packaging, you will use this to return your Zio monitor.  You will easily be able to apply the monitor with the instructions provided in the Patient Guide.  If you need assistance, [employer] representatives are available 24/7 at [phone].  You can also download the [facility] app on your phone to view detailed application  instructions and log symptoms.  After you wear your monitor for 14 days, place it back in the blue box or envelope, along with your Symptom Log.  To send your monitor back: Simply use the pre-addressed and pre-paid box/envelope.  Send it back through [employer] the same day you remove it via your local post office or by placing it in your mailbox.  As soon as we receive the results, they will be reviewed and your clinician will contact you.  For the first 24 hours- it is essential to not shower or exercise, to allow the patch to adhere to your skin. Avoid excessive sweating to help maximize wear time. Do not submerge the device, no hot tubs, and no swimming pools. Keep any lotions or oils away from the patch. After 24 hours you may shower with the patch on. Take brief showers with your back facing the shower head.  Do not remove patch once it has been placed because that will interrupt data and decrease adhesive wear time. Push the button when you have any symptoms and write down what you were feeling. Once you have completed wearing your monitor, remove and place into box which has postage paid and place in your outgoing mailbox.  If for some reason you have misplaced your box then call our office and we can provide another box and/or mail it off for you.     Follow-Up: At [facility], you and your health needs are our priority.  As part of  our continuing mission to provide you with exceptional heart care, we have created designated Provider Care Teams.  These Care Teams include your primary Cardiologist (physician) and Advanced Practice Providers (APPs -  Physician Assistants and Nurse Practitioners) who all work together to provide you with the care you need, when you need it.  We recommend signing up for the patient portal called "MyChart".  Sign up information is provided on this After Visit Summary.  MyChart is used to connect with patients for Virtual Visits (Telemedicine).   Patients are able to view lab/test results, encounter notes, upcoming appointments, etc.  Non-urgent messages can be sent to your provider as well.   To learn more about what you can do with MyChart, go to [email or web address].    Your next appointment:   6-8 week(s)  The format for your next appointment:   In Person  Provider:   You may see Kate Sable, MD or one of the following Advanced Practice Providers on your designated Care Team:   Murray Hodgkins, NP Christell Faith, PA-C Cadence Kathlen Mody, PA-C Gerrie Nordmann, NP    Other Instructions   Important Information About Sugar

## 2022-10-02 NOTE — Addendum Note (Signed)
Addended by: Dewayne Shorter on: 10/02/2022 02:09 PM   Modules accepted: Orders

## 2022-10-03 ENCOUNTER — Encounter: Payer: Self-pay | Admitting: Student

## 2022-10-04 ENCOUNTER — Encounter: Payer: Self-pay | Admitting: Student

## 2022-10-04 ENCOUNTER — Non-Acute Institutional Stay (SKILLED_NURSING_FACILITY): Payer: Medicare HMO | Admitting: Student

## 2022-10-04 ENCOUNTER — Encounter: Payer: Self-pay | Admitting: Urology

## 2022-10-04 ENCOUNTER — Ambulatory Visit (INDEPENDENT_AMBULATORY_CARE_PROVIDER_SITE_OTHER): Payer: Medicare HMO | Admitting: Urology

## 2022-10-04 ENCOUNTER — Ambulatory Visit: Payer: Medicare HMO | Admitting: Physician Assistant

## 2022-10-04 VITALS — BP 119/72 | HR 86 | Ht 65.0 in | Wt 187.0 lb

## 2022-10-04 DIAGNOSIS — R339 Retention of urine, unspecified: Secondary | ICD-10-CM

## 2022-10-04 DIAGNOSIS — D62 Acute posthemorrhagic anemia: Secondary | ICD-10-CM | POA: Diagnosis not present

## 2022-10-04 DIAGNOSIS — I480 Paroxysmal atrial fibrillation: Secondary | ICD-10-CM

## 2022-10-04 DIAGNOSIS — I951 Orthostatic hypotension: Secondary | ICD-10-CM

## 2022-10-04 NOTE — Progress Notes (Signed)
Location:  [facility] Room Number: 102-A Place of Service:  SNF [phone]) Provider:  Dewayne Shorter, MD  Patient Care Team: Dewayne Shorter, MD as PCP - General (Family Medicine) Kate Sable, MD as PCP - Cardiology (Cardiology) Theodore Demark, RN (Inactive) as Oncology Nurse Navigator Lauree Chandler, NP as Nurse Practitioner (Geriatric Medicine)  Extended Emergency Contact Information Primary Emergency Contact: Raabe,david Address: [address]          [place], [place] [address] Johnnette Litter of [place] Phone: [phone] Relation: Son  Code Status:  DNR Goals of care: Advanced Directive information    10/04/2022   11:19 AM  Advanced Directives  Does Patient Have a Medical Advance Directive? Yes  Type of Advance Directive Out of facility DNR (pink MOST or yellow form)  Does patient want to make changes to medical advance directive? No - Patient declined     Chief Complaint  Patient presents with   Acute Visit    Wheezing and hypotension, vitals and medications are a reflection of [facility] [email or web address], [employer].     HPI:  Pt is a 88 y.o. female seen today for an acute visit for follow up of hypotension with therapy. Patient is resting comfortably after working with therapy. Family request discussion regarding care plan. Seen by cardiology and Urology in the last few days. Clarified indications and concerns regarding various new medications: Eliquis, flomax, and lasix. Discussed concern for symptoms with flomax given high risk for orthostatic hypotension and for lasix given changes in volume can lead to symptoms. Cardiology concern that she had a slight wheeze due to volume status. Plan for an echocardiogram on Monday and a ziopatch placed for 2 weeks.    Past Medical History:  Diagnosis Date   Breast cancer ([place])    CKD (chronic kidney disease) stage 3, GFR 30-59 ml/min (HCC)    Displaced intertrochanteric fracture of right femur  ([place])    Per [facility] EMR, [employer] [facility]   Former smoker    Heart disease    mitral valvular   Hyperlipidemia    Hyperparathyroidism, secondary ([place])    Hypertension    Hypo-osmolality and hyponatremia    Per [facility] EMR, [employer] [facility]   Hypothyroidism    Macular degeneration    Per [employer] EMR, [employer] [facility]   Sensorineural hearing loss    Spinal stenosis of lumbar region 2006   s/p surgery   UTI (urinary tract infection)    Per [facility] EMR, [employer] [facility]   Vitamin D deficiency    Per [facility] EMR, [employer] [facility]   Past Surgical History:  Procedure Laterality Date   ABDOMINAL HYSTERECTOMY     bilateral cataract surgery     BREAST LUMPECTOMY Right 07/22/2021   CHOLECYSTECTOMY  12/04/1978   FRACTURE SURGERY Left 12/29/2018   FEMUR   INTRAMEDULLARY (IM) NAIL INTERTROCHANTERIC Right 09/21/2022   Procedure: INTRAMEDULLARY (IM) NAIL INTERTROCHANTERIC;  Surgeon: Thornton Park, MD;  Location: [facility] ORS;  Service: Orthopedics;  Laterality: Right;   JOINT REPLACEMENT     Bilateral knee   LAPAROSCOPIC SIGMOID COLECTOMY  12/04/2009   secondary to benign mass   MELANOMA EXCISION WITH SENTINEL LYMPH NODE BIOPSY Right 07/22/2021   Procedure: MELANOMA EXCISION WITH SENTINEL LYMPH NODE BIOPSY;  Surgeon: Herbert Pun, MD;  Location: [facility] ORS;  Service: General;  Laterality: Right;   PART [place] BIOPSY Right 07/22/2021  Procedure: PART [place] NODE BIOPSY;  Surgeon: Herbert Pun, MD;  Location: [facility] ORS;  Service: General;  Laterality: Right;   REPLACEMENT TOTAL KNEE BILATERAL     SMALL INTESTINE SURGERY  12/04/2008   for SBO   SPINE SURGERY  12/04/2004   [facility], [facility]   tendon repair     achille heall, left     Allergies  Allergen Reactions   Bactrim [Sulfamethoxazole-Trimethoprim] Nausea And Vomiting    Lethargic and weakness     Outpatient Encounter Medications as of 10/04/2022  Medication Sig   acetaminophen (TYLENOL) 325 MG tablet Take 1-2 tablets (325-650 mg total) by mouth every 6 (six) hours as needed for mild pain (pain score 1-3 or temp > 100.5).   alum & mag hydroxide-simeth (MAALOX/MYLANTA) 200-200-20 MG/5ML suspension Take 30 mLs by mouth every 4 (four) hours as needed for indigestion.   apixaban (ELIQUIS) 5 MG TABS tablet Take 1 tablet (5 mg total) by mouth 2 (two) times daily.   bisacodyl (DULCOLAX) 10 MG suppository Place 1 suppository (10 mg total) rectally daily as needed for moderate constipation.   Calcium Carbonate-Vitamin D3 600-400 MG-UNIT TABS Take 1 tablet by mouth 2 (two) times daily.   diltiazem (CARDIZEM CD) 120 MG 24 hr capsule Take 1 capsule (120 mg total) by mouth daily.   docusate sodium (COLACE) 100 MG capsule Take 1 capsule (100 mg total) by mouth 2 (two) times daily.   furosemide (LASIX) 20 MG tablet Take 1 tablet (20 mg total) by mouth daily. Take for 5 days, and then let our office know if your symptoms have improved.   levothyroxine (SYNTHROID) 50 MCG tablet TAKE 1 TABLET EVERY DAY ON EMPTY STOMACHWITH A GLASS OF WATER AT LEAST 30-60 MINBEFORE BREAKFAST   Magnesium Oxide 250 MG TABS Take 1 tablet by mouth every other day. At bedtime   Multiple Vitamins-Minerals (PRESERVISION AREDS PO) Take 1 capsule by mouth 2 (two) times daily.   ondansetron (ZOFRAN) 4 MG tablet Take 1 tablet (4 mg total) by mouth every 6 (six) hours as needed for nausea.   oxyCODONE (OXY IR/ROXICODONE) 5 MG immediate release tablet Take 0.5 tablets (2.5 mg total) by mouth every 6 (six) hours as needed for up to 14 days for severe pain.   polyethylene glycol (MIRALAX / GLYCOLAX) 17 g packet Take 17 g by mouth daily as needed for mild constipation.   tamsulosin (FLOMAX) 0.4 MG CAPS capsule Take 1 capsule (0.4 mg total) by mouth daily.   tuberculin 5 UNIT/0.1ML injection Inject 0.1 mLs into the skin once.   No  facility-administered encounter medications on file as of 10/04/2022.    Review of Systems  All other systems reviewed and are negative.   Immunization History  Administered Date(s) Administered   Influenza Split 10/03/2014, 10/04/2015   Influenza, High Dose Seasonal PF 10/04/2019, 09/21/2020, 09/20/2022   Influenza-Unspecified 09/03/2013, 10/01/2016, 10/06/2017, 09/18/2018   Moderna Sars-Covid-2 Vaccination 12/22/2019, 01/19/2020, 08/08/2021   Pneumococcal Conjugate-13 10/26/2014   Pneumococcal Polysaccharide-23 04/04/1993, 09/20/2008, 01/28/2016   Tdap 02/14/2013   Zoster Recombinat (Shingrix) 02/07/2017, 06/18/2017   Zoster, Live 12/08/2009   Pertinent  Health Maintenance Due  Topic Date Due   INFLUENZA VACCINE  Completed   DEXA SCAN  Completed      09/24/2022    1:00 AM 09/24/2022   11:00 AM 09/25/2022   12:00 AM 09/25/2022    8:00 AM 09/27/2022    2:43 PM  Fall Risk  Patient Fall Risk Level High  fall risk High fall risk High fall risk High fall risk High fall risk   Functional Status Survey:    Vitals:   10/04/22 1117  BP: (!) 145/78  Pulse: 85  Resp: 18  Temp: (!) 97.5 F (36.4 C)  SpO2: 93%  Weight: 187 lb 3.2 oz (84.9 kg)  Height: '5\' 5"'$  (1.651 m)   Body mass index is 31.15 kg/m. Physical Exam Vitals reviewed.  Constitutional:      Comments: Sleeping comfortably in recliner  Cardiovascular:     Pulses: Normal pulses.     Comments: Appears well-perfused Pulmonary:     Effort: Pulmonary effort is normal.  Abdominal:     General: Abdomen is flat.     Labs reviewed: Recent Labs    09/21/22 2149 09/22/22 0314 09/24/22 0526 09/25/22 0304 09/27/22 1446 09/28/22 0000  NA  --    < > 132* 132* 135 135*  K  --    < > 4.2 4.0 3.8 4.2  CL  --    < > 105 103 101 102  CO2  --    < > '24 25 25 '$ 26*  GLUCOSE  --    < > 112* 95 117*  --   BUN  --    < > '16 16 18 20  '$ CREATININE  --    < > 1.26* 1.28* 1.34* 1.3*  CALCIUM  --    < > 7.8* 7.7* 9.2 8.6*   MG 1.6*  --   --   --  2.0  --   PHOS 2.9  --   --   --   --   --    < > = values in this interval not displayed.   Recent Labs    09/27/22 1446  AST 40  ALT 23  ALKPHOS 78  BILITOT 2.2*  PROT 6.8  ALBUMIN 3.3*   Recent Labs    09/20/22 1807 09/22/22 0314 09/24/22 0526 09/25/22 0304 09/27/22 1446 09/28/22 0000  WBC 8.5   < > 8.5 8.6 9.0 7.7  NEUTROABS 5.6  --   --   --  6.1 4,851.00  HGB 11.8*   < > 8.1* 7.6* 9.0* 7.9*  HCT 35.4*   < > 23.9* 22.3* 27.7* 24*  MCV 95.7   < > 92.3 94.1 96.5  --   PLT 247   < > 187 198 337 341   < > = values in this interval not displayed.   Lab Results  Component Value Date   TSH 2.523 09/23/2022   Lab Results  Component Value Date   HGBA1C 5.8 03/08/2020   Lab Results  Component Value Date   CHOL 213 (H) 08/17/2021   HDL 45 (L) 08/17/2021   LDLCALC 114 (H) 08/17/2021   LDLDIRECT 132.0 07/12/2017   TRIG 377 (H) 08/17/2021   CHOLHDL 4.7 08/17/2021    Significant Diagnostic Results in last 30 days:  US Venous Img Lower Bilateral  Result Date: 09/27/2022 CLINICAL DATA:  post op eval for dvt EXAM: BILATERAL LOWER EXTREMITY VENOUS DOPPLER ULTRASOUND TECHNIQUE: Gray-scale sonography with graded compression, as well as color Doppler and duplex ultrasound were performed to evaluate the lower extremity deep venous systems from the level of the common femoral vein and including the common femoral, femoral, profunda femoral, popliteal and calf veins including the posterior tibial, peroneal and gastrocnemius veins when visible. The superficial great saphenous vein was also interrogated. Spectral Doppler was utilized to evaluate flow at rest and with  distal augmentation maneuvers in the common femoral, femoral and popliteal veins. COMPARISON:  None Available. FINDINGS: RIGHT LOWER EXTREMITY Common Femoral Vein: No evidence of thrombus. Normal compressibility, respiratory phasicity and response to augmentation. Saphenofemoral Junction: No  evidence of thrombus. Normal compressibility and flow on color Doppler imaging. Profunda Femoral Vein: No evidence of thrombus. Normal compressibility and flow on color Doppler imaging. Femoral Vein: No evidence of thrombus. Normal compressibility, respiratory phasicity and response to augmentation. Popliteal Vein: No evidence of thrombus. Normal compressibility, respiratory phasicity and response to augmentation. Calf Veins: No evidence of thrombus. Normal compressibility and flow on color Doppler imaging. LEFT LOWER EXTREMITY Common Femoral Vein: No evidence of thrombus. Normal compressibility, respiratory phasicity and response to augmentation. Saphenofemoral Junction: No evidence of thrombus. Normal compressibility and flow on color Doppler imaging. Profunda Femoral Vein: No evidence of thrombus. Normal compressibility and flow on color Doppler imaging. Femoral Vein: No evidence of thrombus. Normal compressibility, respiratory phasicity and response to augmentation. Popliteal Vein: No evidence of thrombus. Normal compressibility, respiratory phasicity and response to augmentation. Calf Veins: No evidence of thrombus. Normal compressibility and flow on color Doppler imaging. IMPRESSION: No evidence of deep venous thrombosis in either lower extremity. Electronically Signed   By: Margaretha Sheffield M.D.   On: 09/27/2022 16:40   DG Chest Portable 1 View  Result Date: 09/27/2022 CLINICAL DATA:  Short of breath, neck pain EXAM: PORTABLE CHEST 1 VIEW COMPARISON:  09/21/2022 FINDINGS: Single frontal view of the chest demonstrates a stable cardiac silhouette. Mild increased central vascular congestion without airspace disease, effusion, or pneumothorax. There are no acute bony abnormalities. IMPRESSION: 1. Increased central vascular congestion without overt edema. Electronically Signed   By: Randa Ngo M.D.   On: 09/27/2022 15:26   CT Angio Chest Pulmonary Embolism (PE) W or WO Contrast  Result Date:  09/21/2022 CLINICAL DATA:  Concern for pulmonary embolism. EXAM: CT ANGIOGRAPHY CHEST WITH CONTRAST TECHNIQUE: Multidetector CT imaging of the chest was performed using the standard protocol during bolus administration of intravenous contrast. Multiplanar CT image reconstructions and MIPs were obtained to evaluate the vascular anatomy. RADIATION DOSE REDUCTION: This exam was performed according to the departmental dose-optimization program which includes automated exposure control, adjustment of the mA and/or kV according to patient size and/or use of iterative reconstruction technique. CONTRAST:  77m OMNIPAQUE IOHEXOL 350 MG/ML SOLN COMPARISON:  Chest radiograph dated 09/21/2022. FINDINGS: Evaluation of this exam is limited due to respiratory motion artifact. Cardiovascular: There is no cardiomegaly or pericardial effusion. Three-vessel coronary vascular calcification. Mild atherosclerotic calcification of the thoracic aorta. No aneurysmal dilatation or dissection. Evaluation of the pulmonary arteries is limited due to respiratory motion. No pulmonary artery embolus identified. Mediastinum/Nodes: No hilar or mediastinal adenopathy. The esophagus is grossly unremarkable. There is a 12 mm left thyroid hypodense nodule. In the setting of significant comorbidities or limited life expectancy, no follow-up recommended (ref: J Am Coll Radiol. 2015 Feb;12(2): 143-50).No mediastinal fluid collection. Lungs/Pleura: Bibasilar subpleural scarring. No focal consolidation, pleural effusion, or pneumothorax. There is a 5 mm subpleural nodule in the superior segment of the left lower lobe (44/6). The central airways are patent. Upper Abdomen: Cholecystectomy. Fatty liver. Partially visualized 4 cm left renal cyst. Musculoskeletal: Osteopenia degenerative changes of the spine. Age indeterminate, likely chronic compression fracture of superior endplate of T3 with approximately 30% loss of vertebral body height and anterior  wedging. Correlation with clinical exam and point tenderness recommended. No definite acute osseous pathology. Review of the MIP images confirms the  above findings. IMPRESSION: 1. No acute intrathoracic pathology. No CT evidence of pulmonary artery embolus. 2. A 5 mm subpleural nodule in the superior segment of the left lower lobe. No follow-up needed if patient is low-risk.This recommendation follows the consensus statement: Guidelines for Management of Incidental Pulmonary Nodules Detected on CT Images: From the Fleischner Society 2017; Radiology 2017; 284:228-243. 3. Age indeterminate, likely chronic compression fracture of superior endplate of T3 with approximately 30% loss of vertebral body height and anterior wedging. Correlation with clinical exam and point tenderness recommended. 4. Fatty liver. 5.  Aortic Atherosclerosis (ICD10-I70.0). Electronically Signed   By: Anner Crete M.D.   On: 09/21/2022 22:43   DG Chest Port 1 View  Result Date: 09/21/2022 CLINICAL DATA:  Dyspnea EXAM: PORTABLE CHEST 1 VIEW COMPARISON:  Chest 09/20/2022 FINDINGS: The heart size and mediastinal contours are within normal limits. Both lungs are clear. The visualized skeletal structures are unremarkable. IMPRESSION: No active disease. Electronically Signed   By: Franchot Gallo M.D.   On: 09/21/2022 19:57   DG FEMUR, MIN 2 VIEWS RIGHT  Result Date: 09/21/2022 CLINICAL DATA:  Right hip fracture fixation. EXAM: RIGHT FEMUR 2 VIEWS COMPARISON:  Radiographs from yesterday. FINDINGS: There is a long intramedullary rod in the femur with a proximal dynamic hip screw transfixing the intertrochanteric fracture with anatomic reduction. Two distal interlocking screws are also noted. IMPRESSION: Internal fixation of intertrochanteric right hip fracture with anatomic reduction. Electronically Signed   By: Marijo Sanes M.D.   On: 09/21/2022 15:39   DG HIP UNILAT WITH PELVIS 2-3 VIEWS RIGHT  Result Date: 09/21/2022 CLINICAL  DATA:  Elective surgery.  Right femoral intramedullary nail EXAM: DG HIP (WITH OR WITHOUT PELVIS) 2-3V RIGHT COMPARISON:  Right hip radiographs 09/20/2022 FINDINGS: Images were performed intraoperatively without the presence of a radiologist. The patient is undergoing long cephalomedullary nail fixation of the right femur. The patient is seen intertrochanteric fracture is now normally aligned. Total fluoroscopy images: 4 Total fluoroscopy time: 100 seconds Total dose: Radiation Exposure Index (as provided by the fluoroscopic device): 20.92 mGy air Kerma Please see intraoperative findings for further detail. IMPRESSION: Intraoperative fluoroscopic guidance for long cephalomedullary nail fixation of the right femur. Electronically Signed   By: Yvonne Kendall M.D.   On: 09/21/2022 14:41   DG C-Arm 1-60 Min-No Report  Result Date: 09/21/2022 Fluoroscopy was utilized by the requesting physician.  No radiographic interpretation.   DG Chest 1 View  Result Date: 09/20/2022 CLINICAL DATA:  Fall EXAM: CHEST  1 VIEW COMPARISON:  09/27/2020 FINDINGS: Low lung volumes. No acute airspace disease or effusion. Upper normal cardiomediastinal silhouette augmented by low lung volume. No pneumothorax IMPRESSION: No active disease.  Low lung volume Electronically Signed   By: Donavan Foil M.D.   On: 09/20/2022 18:42   DG Hip Unilat W or Wo Pelvis 2-3 Views Right  Result Date: 09/20/2022 CLINICAL DATA:  Hip pain with fall EXAM: DG HIP (WITH OR WITHOUT PELVIS) 2-3V RIGHT COMPARISON:  None Available. FINDINGS: Hardware in the lower lumbar spine. Pubic symphysis and rami appear intact. Multiple clips in the pelvis. Acute comminuted intertrochanteric fracture with displacement. No femoral head dislocation IMPRESSION: Acute comminuted and displaced right intertrochanteric fracture Electronically Signed   By: Donavan Foil M.D.   On: 09/20/2022 18:41    Assessment/Plan 1. Paroxysmal atrial fibrillation (HCC) Orthostatic  hypotension. Patient could have symptoms due to atrial fibrillation vs other underlying cardiac condition. Discussed at length medication changes, concerns, and future goals regarding  BP maintenance. Discussed compression stockings, hydration, midodrine if BP does not improve with discontinuing two high risk medications for orthostatic hypotension. Plan to continue monitoring Bps with therapy for the next few weeks.   2. Anemia Patient's hgb has been stable at 7.9 in the postoperative period. RBC's remain low. Have been low for 1 year. Will repeat labs after 2 more weeks to determine if CBC is improving appropriately. Can consider doing additional labs associated with RBC production if persistently low such as iron panel, reticulocytes, etc.   3. Urinary Retention D/C flomax. D/c foley on 11/8 at [facility] per urology records.   Family/ staff Communication: Shanon Brow and wife, nursing   Labs/tests ordered:  CBC in 2 wks  Tomasa Rand, MD, [place] [phone]

## 2022-10-04 NOTE — Progress Notes (Signed)
10/04/2022 8:03 AM   Pamela Paul 07-06-1926 [id number]  Referring provider: Dewayne Shorter, [place],  [place] [address]  Chief Complaint  Patient presents with   Urinary Retention    HPI: Pamela Paul is a 88 y.o. female presents for hospital follow-up for urinary retention and voiding trial.  She presents today with family members  Hospitalized 09/25/2022 for right femur fracture and underwent intramedullary nail performed 09/21/2022 Had postop urinary retention and was discharged with an indwelling Foley catheter. Denies voiding problems or prior history of urinary retention prior to hospitalization Has developed orthostatic hypotension and is behind in her PT/rehab Was also placed on a diuretic by cardiology which she started yesterday On review of her active medications she is on tamsulosin which was started while hospitalized for urinary retention   PMH: Past Medical History:  Diagnosis Date   Breast cancer ([place])    CKD (chronic kidney disease) stage 3, GFR 30-59 ml/min (HCC)    Displaced intertrochanteric fracture of right femur ([place])    Per [facility], [facility]   Former smoker    Heart disease    mitral valvular   Hyperlipidemia    Hyperparathyroidism, secondary ([place])    Hypertension    Hypo-osmolality and hyponatremia    Per [employer] EMR, [facility]   Hypothyroidism    Macular degeneration    Per [employer] EMR, [facility]   Sensorineural hearing loss    Spinal stenosis of lumbar region 2006   s/p surgery   UTI (urinary tract infection)    Per [facility] EMR, [facility]   Vitamin D deficiency    Per [facility] EMR, [facility]    Surgical History: Past Surgical History:  Procedure Laterality Date   ABDOMINAL HYSTERECTOMY     bilateral cataract surgery     BREAST LUMPECTOMY Right 07/22/2021   CHOLECYSTECTOMY  12/04/1978   FRACTURE SURGERY Left 12/29/2018   FEMUR   INTRAMEDULLARY (IM) NAIL  INTERTROCHANTERIC Right 09/21/2022   Procedure: INTRAMEDULLARY (IM) NAIL INTERTROCHANTERIC;  Surgeon: Thornton Park, MD;  Location: [facility] ORS;  Service: Orthopedics;  Laterality: Right;   JOINT REPLACEMENT     Bilateral knee   LAPAROSCOPIC SIGMOID COLECTOMY  12/04/2009   secondary to benign mass   MELANOMA EXCISION WITH SENTINEL LYMPH NODE BIOPSY Right 07/22/2021   Procedure: MELANOMA EXCISION WITH SENTINEL LYMPH NODE BIOPSY;  Surgeon: Herbert Pun, MD;  Location: [facility] ORS;  Service: General;  Laterality: Right;   PART [place] NODE BIOPSY Right 07/22/2021   Procedure: PART MASTECTOMY,RADIO FREQUENCY LOCALIZER,AXILLARY SENTINEL NODE BIOPSY;  Surgeon: Herbert Pun, MD;  Location: [facility] ORS;  Service: General;  Laterality: Right;   REPLACEMENT TOTAL KNEE BILATERAL     SMALL INTESTINE SURGERY  12/04/2008   for SBO   SPINE SURGERY  12/04/2004   [facility], [facility]   tendon repair     achille heall, left     Home Medications:  Allergies as of 10/04/2022       Reactions   Bactrim [sulfamethoxazole-trimethoprim] Nausea And Vomiting   Lethargic and weakness        Medication List        Accurate as of October 04, 2022  8:03 AM. If you have any questions, ask your nurse or doctor.          acetaminophen 325 MG tablet Commonly known as: TYLENOL Take 1-2 tablets (325-650 mg total) by mouth every  6 (six) hours as needed for mild pain (pain score 1-3 or temp > 100.5).   alum & mag hydroxide-simeth 200-200-20 MG/5ML suspension Commonly known as: MAALOX/MYLANTA Take 30 mLs by mouth every 4 (four) hours as needed for indigestion.   apixaban 5 MG Tabs tablet Commonly known as: Eliquis Take 1 tablet (5 mg total) by mouth 2 (two) times daily.   bisacodyl 10 MG suppository Commonly known as: DULCOLAX Place 1 suppository (10 mg total) rectally daily as needed for moderate constipation.   Calcium Carbonate-Vitamin D3 600-400  MG-UNIT Tabs Take 1 tablet by mouth 2 (two) times daily.   diltiazem 120 MG 24 hr capsule Commonly known as: Cardizem CD Take 1 capsule (120 mg total) by mouth daily.   docusate sodium 100 MG capsule Commonly known as: COLACE Take 1 capsule (100 mg total) by mouth 2 (two) times daily.   furosemide 20 MG tablet Commonly known as: LASIX Take 1 tablet (20 mg total) by mouth daily. Take for 5 days, and then let our office know if your symptoms have improved.   levothyroxine 50 MCG tablet Commonly known as: SYNTHROID TAKE 1 TABLET EVERY DAY ON EMPTY STOMACHWITH A GLASS OF WATER AT LEAST 30-60 MINBEFORE BREAKFAST   Magnesium Oxide 250 MG Tabs Take 1 tablet by mouth every other day. At bedtime   ondansetron 4 MG tablet Commonly known as: ZOFRAN Take 1 tablet (4 mg total) by mouth every 6 (six) hours as needed for nausea.   oxyCODONE 5 MG immediate release tablet Commonly known as: Oxy IR/ROXICODONE Take 0.5 tablets (2.5 mg total) by mouth every 6 (six) hours as needed for up to 14 days for severe pain.   polyethylene glycol 17 g packet Commonly known as: MIRALAX / GLYCOLAX Take 17 g by mouth daily as needed for mild constipation.   PRESERVISION AREDS PO Take 1 capsule by mouth 2 (two) times daily.   tamsulosin 0.4 MG Caps capsule Commonly known as: FLOMAX Take 1 capsule (0.4 mg total) by mouth daily.   tuberculin 5 UNIT/0.1ML injection Inject 0.1 mLs into the skin once.        Allergies:  Allergies  Allergen Reactions   Bactrim [Sulfamethoxazole-Trimethoprim] Nausea And Vomiting    Lethargic and weakness    Family History: Family History  Problem Relation Age of Onset   Arthritis Mother    Stroke Mother    Hyperlipidemia Father    Cancer Father    Heart disease Paternal Aunt     Social History:  reports that she quit smoking about 61 years ago. Her smoking use included cigarettes. She has never used smokeless tobacco. She reports that she does not currently  use alcohol. She reports that she does not use drugs.   Physical Exam: BP 119/72   Pulse 86   Ht '5\' 5"'$  (1.651 m)   Wt 187 lb (84.8 kg)   BMI 31.12 kg/m   Constitutional:  Alert, No acute distress. HEENT: [place] AT Respiratory: Normal respiratory effort, no increased work of breathing. Psychiatric: Normal mood and affect.    Assessment & Plan:   88 y.o. female with urinary retention after fall/hip fracture.  Retention most likely multifactorial and secondary to immobility, anesthesia and pain medication Since she is behind in her rehab secondary to orthostatic hypotension and was recently placed on diuretic feel the chance of persistent retention is increased and have recommended leaving the catheter in until next week Her orthostatic hypotension is most likely secondary to tamsulosin.  Her  urinary retention would not be obstructive and order was written to discontinue this medication Order also written to remove her Foley catheter next week and she will return in the afternoon for a bladder scan.   Abbie Sons, [place] [address] [place], [place] [address] [phone]

## 2022-10-04 NOTE — Patient Instructions (Addendum)
Have [facility] remove catheter on 10/11/2022 in early morning. Return to our office at 2:00 for a bladder scan on 10/11/2022

## 2022-10-05 ENCOUNTER — Encounter: Payer: Self-pay | Admitting: Cardiology

## 2022-10-08 ENCOUNTER — Encounter: Payer: Self-pay | Admitting: Urology

## 2022-10-09 ENCOUNTER — Ambulatory Visit (HOSPITAL_COMMUNITY): Payer: Medicare HMO | Attending: Cardiology

## 2022-10-09 DIAGNOSIS — I480 Paroxysmal atrial fibrillation: Secondary | ICD-10-CM | POA: Insufficient documentation

## 2022-10-09 LAB — ECHOCARDIOGRAM COMPLETE
Area-P 1/2: 2.03 cm2
S' Lateral: 2.4 cm

## 2022-10-11 ENCOUNTER — Ambulatory Visit: Payer: Medicare HMO | Admitting: Urology

## 2022-10-11 DIAGNOSIS — R339 Retention of urine, unspecified: Secondary | ICD-10-CM

## 2022-10-11 LAB — BLADDER SCAN AMB NON-IMAGING: Scan Result: 167

## 2022-10-12 ENCOUNTER — Ambulatory Visit: Payer: Medicare HMO | Admitting: Urology

## 2022-10-12 DIAGNOSIS — R339 Retention of urine, unspecified: Secondary | ICD-10-CM

## 2022-10-12 LAB — BLADDER SCAN AMB NON-IMAGING

## 2022-10-14 NOTE — Progress Notes (Signed)
Pamela Paul returns this afternoon for a follow up PVR.  She has not voided yet today.  Her PVR is 167 mL.  I explained to her, her daughter and her son-in-law, that the PVR is not high enough to require catheterization.  I did express my reservation that she may go into retention over night.  They will come back tomorrow in the am for follow up.

## 2022-10-15 ENCOUNTER — Encounter: Payer: Self-pay | Admitting: Urology

## 2022-10-15 NOTE — Progress Notes (Signed)
10/12/2022 12:34 PM   Pamela Paul 24-Jul-1926 [id number]  Referring provider: Dewayne Shorter, MD [place],  [place] [address]  Urological history: 1. Acute urinary retention -contributing factors of age, recent surgery, lumbar spinal stenosis, constipation and pain medication -post-op retention after repair of a right femur fracture 09/2022 -Foley placed and removed 10/11/2022 am and after PVR 167 cc  2. Renal cyst -CT (09/2022) - 4 cm left renal cyst  -MRI (10/2019) - right-sided lower pole cyst- Left-sided renal cyst, area of concern shows simple fluid signal on T2 weighted imaging measuring 4.4 x 3.1 by 3.5 cm, low signal on T1.  Contrast was not administered due to renal function.  Chief Complaint  Patient presents with   Urinary Retention    HPI: Pamela Paul is a 88 y.o. female who presents today for follow up PVR with her daughter and son-in-law.    She had a TOV yesterday.  She had her Foley removed yesterday am and returned that afternoon stating she did not void.   Her PVR was 167 cc and she did not feel uncomfortable.  Today, she stated she had voided several times since she left our office yesterday afternoon.  Patient denies any modifying or aggravating factors.  Patient denies any gross hematuria, dysuria or suprapubic/flank pain.  Patient denies any fevers, chills, nausea or vomiting.    PVR 16 mL  PMH: Past Medical History:  Diagnosis Date   Breast cancer ([place])    CKD (chronic kidney disease) stage 3, GFR 30-59 ml/min (HCC)    Displaced intertrochanteric fracture of right femur ([place])    Per [facility], [facility]   Former smoker    Heart disease    mitral valvular   Hyperlipidemia    Hyperparathyroidism, secondary ([place])    Hypertension    Hypo-osmolality and hyponatremia    Per [facility] EMR, [facility]   Hypothyroidism    Macular degeneration    Per [employer] EMR, [facility]   Sensorineural hearing loss     Spinal stenosis of lumbar region 2006   s/p surgery   UTI (urinary tract infection)    Per [facility] EMR, [facility]   Vitamin D deficiency    Per [facility] EMR, [facility]    Surgical History: Past Surgical History:  Procedure Laterality Date   ABDOMINAL HYSTERECTOMY     bilateral cataract surgery     BREAST LUMPECTOMY Right 07/22/2021   CHOLECYSTECTOMY  12/04/1978   FRACTURE SURGERY Left 12/29/2018   FEMUR   INTRAMEDULLARY (IM) NAIL INTERTROCHANTERIC Right 09/21/2022   Procedure: INTRAMEDULLARY (IM) NAIL INTERTROCHANTERIC;  Surgeon: Thornton Park, MD;  Location: [facility] ORS;  Service: Orthopedics;  Laterality: Right;   JOINT REPLACEMENT     Bilateral knee   LAPAROSCOPIC SIGMOID COLECTOMY  12/04/2009   secondary to benign mass   MELANOMA EXCISION WITH SENTINEL LYMPH NODE BIOPSY Right 07/22/2021   Procedure: MELANOMA EXCISION WITH SENTINEL LYMPH NODE BIOPSY;  Surgeon: Herbert Pun, MD;  Location: [facility] ORS;  Service: General;  Laterality: Right;   PART [place] NODE BIOPSY Right 07/22/2021   Procedure: PART MASTECTOMY,RADIO FREQUENCY LOCALIZER,AXILLARY SENTINEL NODE BIOPSY;  Surgeon: Herbert Pun, MD;  Location: [facility] ORS;  Service: General;  Laterality: Right;   REPLACEMENT TOTAL KNEE BILATERAL     SMALL INTESTINE SURGERY  12/04/2008   for SBO   SPINE SURGERY  12/04/2004   [facility], [facility]  tendon repair     achille heall, left     Home Medications:  Allergies as of 10/12/2022       Reactions   Bactrim [sulfamethoxazole-trimethoprim] Nausea And Vomiting   Lethargic and weakness        Medication List        Accurate as of October 12, 2022 11:59 PM. If you have any questions, ask your nurse or doctor.          acetaminophen 325 MG tablet Commonly known as: TYLENOL Take 1-2 tablets (325-650 mg total) by mouth every 6 (six) hours as needed for mild pain (pain score 1-3 or temp > 100.5).    alum & mag hydroxide-simeth 200-200-20 MG/5ML suspension Commonly known as: MAALOX/MYLANTA Take 30 mLs by mouth every 4 (four) hours as needed for indigestion.   apixaban 5 MG Tabs tablet Commonly known as: Eliquis Take 1 tablet (5 mg total) by mouth 2 (two) times daily.   bisacodyl 10 MG suppository Commonly known as: DULCOLAX Place 1 suppository (10 mg total) rectally daily as needed for moderate constipation.   Calcium Carbonate-Vitamin D3 600-400 MG-UNIT Tabs Take 1 tablet by mouth 2 (two) times daily.   diltiazem 120 MG 24 hr capsule Commonly known as: Cardizem CD Take 1 capsule (120 mg total) by mouth daily.   docusate sodium 100 MG capsule Commonly known as: COLACE Take 1 capsule (100 mg total) by mouth 2 (two) times daily.   furosemide 20 MG tablet Commonly known as: LASIX Take 1 tablet (20 mg total) by mouth daily. Take for 5 days, and then let our office know if your symptoms have improved.   levothyroxine 50 MCG tablet Commonly known as: SYNTHROID TAKE 1 TABLET EVERY DAY ON EMPTY STOMACHWITH A GLASS OF WATER AT LEAST 30-60 MINBEFORE BREAKFAST   Magnesium Oxide 250 MG Tabs Take 1 tablet by mouth every other day. At bedtime   ondansetron 4 MG tablet Commonly known as: ZOFRAN Take 1 tablet (4 mg total) by mouth every 6 (six) hours as needed for nausea.   oxyCODONE 5 MG immediate release tablet Commonly known as: Oxy IR/ROXICODONE Take 0.5 tablets (2.5 mg total) by mouth every 6 (six) hours as needed for up to 14 days for severe pain.   polyethylene glycol 17 g packet Commonly known as: MIRALAX / GLYCOLAX Take 17 g by mouth daily as needed for mild constipation.   PRESERVISION AREDS PO Take 1 capsule by mouth 2 (two) times daily.   tuberculin 5 UNIT/0.1ML injection Inject 0.1 mLs into the skin once.        Allergies:  Allergies  Allergen Reactions   Bactrim [Sulfamethoxazole-Trimethoprim] Nausea And Vomiting    Lethargic and weakness    Family  History: Family History  Problem Relation Age of Onset   Arthritis Mother    Stroke Mother    Hyperlipidemia Father    Cancer Father    Heart disease Paternal Aunt     Social History:  reports that she quit smoking about 61 years ago. Her smoking use included cigarettes. She has never used smokeless tobacco. She reports that she does not currently use alcohol. She reports that she does not use drugs.  ROS: Pertinent ROS in HPI  Physical Exam: Constitutional:  Well nourished. Alert and oriented, No acute distress. HEENT: [place] AT, moist mucus membranes.  Trachea midline Cardiovascular: No clubbing, cyanosis, or edema. Respiratory: Normal respiratory effort, no increased work of breathing. Neurologic: Grossly intact, no focal deficits, moving all  4 extremities. Psychiatric: Normal mood and affect.  Laboratory Data: Lab Results  Component Value Date   WBC 7.7 09/28/2022   HGB 7.9 (A) 09/28/2022   HCT 24 (A) 09/28/2022   MCV 96.5 09/27/2022   PLT 341 09/28/2022    Lab Results  Component Value Date   CREATININE 1.3 (A) 09/28/2022    Lab Results  Component Value Date   HGBA1C 5.8 03/08/2020    Lab Results  Component Value Date   TSH 2.523 09/23/2022       Component Value Date/Time   CHOL 213 (H) 08/17/2021 1042   HDL 45 (L) 08/17/2021 1042   CHOLHDL 4.7 08/17/2021 1042   VLDL 34.4 07/31/2018 0823   LDLCALC 114 (H) 08/17/2021 1042    Lab Results  Component Value Date   AST 40 09/27/2022   Lab Results  Component Value Date   ALT 23 09/27/2022   Urinalysis    Component Value Date/Time   BILIRUBINUR small 12/28/2014 0956   PROTEINUR 30 12/28/2014 0956   UROBILINOGEN 0.2 12/28/2014 0956   NITRITE neg 12/28/2014 0956   LEUKOCYTESUR large (3+) 12/28/2014 0956  I have reviewed the labs.   Pertinent Imaging:  10/12/22 08:17  Scan Result 31m    Assessment & Plan:    1. Urinary retention - Bladder Scan (Post Void Residual) in office - PVR demonstrates  adequate emptying  Return in about 1 month (around 11/11/2022) for follow up PVR and OAB .  These notes generated with voice recognition software. I apologize for typographical errors.  [place] [id number] [place] [phone]

## 2022-10-23 ENCOUNTER — Encounter: Payer: Self-pay | Admitting: Student

## 2022-10-23 ENCOUNTER — Non-Acute Institutional Stay (SKILLED_NURSING_FACILITY): Payer: Medicare HMO | Admitting: Student

## 2022-10-23 DIAGNOSIS — M545 Low back pain, unspecified: Secondary | ICD-10-CM | POA: Diagnosis not present

## 2022-10-23 NOTE — Progress Notes (Signed)
Location:  Other Nursing Home Room Number: [facility] of Service:  SNF [phone]) Provider:  Wynn Banker, Jordan Hawks, MD  Patient Care Team: Dewayne Shorter, MD as PCP - General (Family Medicine) Kate Sable, MD as PCP - Cardiology (Cardiology) Theodore Demark, RN (Inactive) as Oncology Nurse Navigator Lauree Chandler, NP as Nurse Practitioner (Geriatric Medicine)  Extended Emergency Contact Information Primary Emergency Contact: Markowicz,david Address: [address]          [place], [place] [address] Johnnette Litter of [place] Phone: [phone] Mobile Phone: [phone] Relation: Son  Code Status:  DNR Goals of care: Advanced Directive information    10/04/2022   11:19 AM  Advanced Directives  Does Patient Have a Medical Advance Directive? Yes  Type of Advance Directive Out of facility DNR (pink MOST or yellow form)  Does patient want to make changes to medical advance directive? No - Patient declined     Chief Complaint  Patient presents with   Acute Visit    Back Pain    HPI:  Pt is a 88 y.o. female seen today for an acute visit for back pain. She states it kept her up last night. Tylenol and heating pad helped with discomfort. She is in a new room and she likes this new space.    Past Medical History:  Diagnosis Date   Breast cancer ([place])    CKD (chronic kidney disease) stage 3, GFR 30-59 ml/min ([facility])    Displaced intertrochanteric fracture of right femur ([place])    Per [facility] EMR, [employer] [facility]   Former smoker    Heart disease    mitral valvular   Hyperlipidemia    Hyperparathyroidism, secondary ([place])    Hypertension    Hypo-osmolality and hyponatremia    Per [facility] EMR, [employer] [facility]   Hypothyroidism    Macular degeneration    Per [employer] EMR, [employer] [facility]   Sensorineural hearing loss    Spinal stenosis of lumbar region 2006   s/p surgery   UTI (urinary tract infection)    Per [facility] EMR, [employer] [facility]    Vitamin D deficiency    Per [facility] EMR, [employer] [facility]   Past Surgical History:  Procedure Laterality Date   ABDOMINAL HYSTERECTOMY     bilateral cataract surgery     BREAST LUMPECTOMY Right 07/22/2021   CHOLECYSTECTOMY  12/04/1978   FRACTURE SURGERY Left 12/29/2018   FEMUR   INTRAMEDULLARY (IM) NAIL INTERTROCHANTERIC Right 09/21/2022   Procedure: INTRAMEDULLARY (IM) NAIL INTERTROCHANTERIC;  Surgeon: Thornton Park, MD;  Location: [facility] ORS;  Service: Orthopedics;  Laterality: Right;   JOINT REPLACEMENT     Bilateral knee   LAPAROSCOPIC SIGMOID COLECTOMY  12/04/2009   secondary to benign mass   MELANOMA EXCISION WITH SENTINEL LYMPH NODE BIOPSY Right 07/22/2021   Procedure: MELANOMA EXCISION WITH SENTINEL LYMPH NODE BIOPSY;  Surgeon: Herbert Pun, MD;  Location: [facility] ORS;  Service: General;  Laterality: Right;   PART [place] NODE BIOPSY Right 07/22/2021   Procedure: PART MASTECTOMY,RADIO FREQUENCY LOCALIZER,AXILLARY SENTINEL NODE BIOPSY;  Surgeon: Herbert Pun, MD;  Location: [facility] ORS;  Service: General;  Laterality: Right;   REPLACEMENT TOTAL KNEE BILATERAL     SMALL INTESTINE SURGERY  12/04/2008   for SBO   SPINE SURGERY  12/04/2004   [facility], [facility]   tendon repair     achille heall, left     Allergies  Allergen Reactions  Bactrim [Sulfamethoxazole-Trimethoprim] Nausea And Vomiting    Lethargic and weakness    Outpatient Encounter Medications as of 10/23/2022  Medication Sig   alum & mag hydroxide-simeth (MAALOX/MYLANTA) 200-200-20 MG/5ML suspension Take 30 mLs by mouth every 4 (four) hours as needed for indigestion.   apixaban (ELIQUIS) 5 MG TABS tablet Take 1 tablet (5 mg total) by mouth 2 (two) times daily.   bisacodyl (DULCOLAX) 10 MG suppository Place 1 suppository (10 mg total) rectally daily as needed for moderate constipation.   Calcium Carbonate-Vitamin D3 600-400 MG-UNIT TABS Take 1 tablet by  mouth 2 (two) times daily.   diltiazem (CARDIZEM CD) 120 MG 24 hr capsule Take 1 capsule (120 mg total) by mouth daily.   docusate sodium (COLACE) 100 MG capsule Take 1 capsule (100 mg total) by mouth 2 (two) times daily.   levothyroxine (SYNTHROID) 50 MCG tablet TAKE 1 TABLET EVERY DAY ON EMPTY STOMACHWITH A GLASS OF WATER AT LEAST 30-60 MINBEFORE BREAKFAST   Magnesium Oxide 250 MG TABS Take 1 tablet by mouth every other day. At bedtime   ondansetron (ZOFRAN) 4 MG tablet Take 1 tablet (4 mg total) by mouth every 6 (six) hours as needed for nausea.   polyethylene glycol (MIRALAX / GLYCOLAX) 17 g packet Take 17 g by mouth daily as needed for mild constipation.   acetaminophen (TYLENOL) 325 MG tablet Take 1-2 tablets (325-650 mg total) by mouth every 6 (six) hours as needed for mild pain (pain score 1-3 or temp > 100.5).   furosemide (LASIX) 20 MG tablet Take 1 tablet (20 mg total) by mouth daily. Take for 5 days, and then let our office know if your symptoms have improved.   Multiple Vitamins-Minerals (PRESERVISION AREDS PO) Take 1 capsule by mouth 2 (two) times daily.   [DISCONTINUED] tuberculin 5 UNIT/0.1ML injection Inject 0.1 mLs into the skin once.   No facility-administered encounter medications on file as of 10/23/2022.    Review of Systems  All other systems reviewed and are negative.   Immunization History  Administered Date(s) Administered   Influenza Split 10/03/2014, 10/04/2015   Influenza, High Dose Seasonal PF 10/04/2019, 09/21/2020, 09/20/2022   Influenza-Unspecified 09/03/2013, 10/01/2016, 10/06/2017, 09/18/2018   Moderna Sars-Covid-2 Vaccination 12/22/2019, 01/19/2020, 08/08/2021   Pneumococcal Conjugate-13 10/26/2014   Pneumococcal Polysaccharide-23 04/04/1993, 09/20/2008, 01/28/2016   Tdap 02/14/2013   Zoster Recombinat (Shingrix) 02/07/2017, 06/18/2017   Zoster, Live 12/08/2009   Pertinent  Health Maintenance Due  Topic Date Due   INFLUENZA VACCINE  Completed    DEXA SCAN  Completed      09/24/2022    1:00 AM 09/24/2022   11:00 AM 09/25/2022   12:00 AM 09/25/2022    8:00 AM 09/27/2022    2:43 PM  Fall Risk  Patient Fall Risk Level High fall risk High fall risk High fall risk High fall risk High fall risk   Functional Status Survey:    Vitals:   10/23/22 1707  BP: 102/65  Pulse: 91  Resp: 20  Temp: 97.7 F (36.5 C)  SpO2: 99%  Weight: 184 lb 6.4 oz (83.6 kg)  Height: '5\' 5"'$  (1.651 m)   Body mass index is 30.69 kg/m. Physical Exam Constitutional:      Appearance: Normal appearance.  Musculoskeletal:     Comments: No tenderness to palpation of spinal muscles or of spine  Skin:    General: Skin is warm and dry.  Neurological:     Mental Status: She is alert.     Labs  reviewed: Recent Labs    09/21/22 2149 09/22/22 0314 09/24/22 0526 09/25/22 0304 09/27/22 1446 09/28/22 0000  NA  --    < > 132* 132* 135 135*  K  --    < > 4.2 4.0 3.8 4.2  CL  --    < > 105 103 101 102  CO2  --    < > '24 25 25 '$ 26*  GLUCOSE  --    < > 112* 95 117*  --   BUN  --    < > '16 16 18 20  '$ CREATININE  --    < > 1.26* 1.28* 1.34* 1.3*  CALCIUM  --    < > 7.8* 7.7* 9.2 8.6*  MG 1.6*  --   --   --  2.0  --   PHOS 2.9  --   --   --   --   --    < > = values in this interval not displayed.   Recent Labs    09/27/22 1446  AST 40  ALT 23  ALKPHOS 78  BILITOT 2.2*  PROT 6.8  ALBUMIN 3.3*   Recent Labs    09/20/22 1807 09/22/22 0314 09/24/22 0526 09/25/22 0304 09/27/22 1446 09/28/22 0000  WBC 8.5   < > 8.5 8.6 9.0 7.7  NEUTROABS 5.6  --   --   --  6.1 4,851.00  HGB 11.8*   < > 8.1* 7.6* 9.0* 7.9*  HCT 35.4*   < > 23.9* 22.3* 27.7* 24*  MCV 95.7   < > 92.3 94.1 96.5  --   PLT 247   < > 187 198 337 341   < > = values in this interval not displayed.   Lab Results  Component Value Date   TSH 2.523 09/23/2022   Lab Results  Component Value Date   HGBA1C 5.8 03/08/2020   Lab Results  Component Value Date   CHOL 213 (H)  08/17/2021   HDL 45 (L) 08/17/2021   LDLCALC 114 (H) 08/17/2021   LDLDIRECT 132.0 07/12/2017   TRIG 377 (H) 08/17/2021   CHOLHDL 4.7 08/17/2021    Significant Diagnostic Results in last 30 days:  ECHOCARDIOGRAM COMPLETE  Result Date: 10/09/2022    ECHOCARDIOGRAM REPORT   Patient Name:   TOMICA ARSENEAULT Date of Exam: 10/09/2022 Medical Rec #:  [id number]      Height:       65.0 in Accession #:    [id number]     Weight:       187.2 lb Date of Birth:  [birth year]      BSA:          1.923 m Patient Age:    79 years       BP:           118/56 mmHg Patient Gender: F              HR:           65 bpm. Exam Location:  [facility] Procedure: 2D Echo, Cardiac Doppler and Color Doppler Indications:    I48.0 Paroxysmal atrial fibrillation  History:        Patient has no prior history of Echocardiogram examinations.                 Arrythmias:Atrial Fibrillation; Risk Factors:Hypertension,                 Dyslipidemia and Former Smoker.  Sonographer:    Coralyn Helling  RDCS Referring Phys: [id number] [facility]  Sonographer Comments: Technically challenging study due to limited acoustic windows and Technically difficult study due to poor echo windows. Image acquisition challenging due to respiratory motion. IMPRESSIONS  1. Left ventricular ejection fraction, by estimation, is 60 to 65%. The left ventricle has normal function. The left ventricle has no regional wall motion abnormalities. Left ventricular diastolic parameters are consistent with Grade I diastolic dysfunction (impaired relaxation). Elevated left ventricular end-diastolic pressure. The E/e' is 104.  2. Right ventricular systolic function is normal. The right ventricular size is normal.  3. The mitral valve is degenerative. Mild mitral valve regurgitation. Moderate mitral annular calcification.  4. The aortic valve is tricuspid. Aortic valve regurgitation is not visualized. Aortic valve sclerosis/calcification is present, without any evidence of aortic  stenosis.  5. The inferior vena cava is normal in size with greater than 50% respiratory variability, suggesting right atrial pressure of 3 mmHg. Comparison(s): No prior Echocardiogram. FINDINGS  Left Ventricle: Left ventricular ejection fraction, by estimation, is 60 to 65%. The left ventricle has normal function. The left ventricle has no regional wall motion abnormalities. The left ventricular internal cavity size was normal in size. There is  no left ventricular hypertrophy. Left ventricular diastolic parameters are consistent with Grade I diastolic dysfunction (impaired relaxation). Elevated left ventricular end-diastolic pressure. The E/e' is 30. Right Ventricle: The right ventricular size is normal. No increase in right ventricular wall thickness. Right ventricular systolic function is normal. Left Atrium: Left atrial size was normal in size. Right Atrium: Right atrial size was normal in size. Pericardium: There is no evidence of pericardial effusion. Mitral Valve: The mitral valve is degenerative in appearance. Moderate mitral annular calcification. Mild mitral valve regurgitation. Tricuspid Valve: The tricuspid valve is grossly normal. Tricuspid valve regurgitation is trivial. Aortic Valve: The aortic valve is tricuspid. Aortic valve regurgitation is not visualized. Aortic valve sclerosis/calcification is present, without any evidence of aortic stenosis. Pulmonic Valve: The pulmonic valve was grossly normal. Pulmonic valve regurgitation is trivial. Aorta: The aortic root and ascending aorta are structurally normal, with no evidence of dilitation. Venous: The inferior vena cava is normal in size with greater than 50% respiratory variability, suggesting right atrial pressure of 3 mmHg. IAS/Shunts: No atrial level shunt detected by color flow Doppler.  LEFT VENTRICLE PLAX 2D LVIDd:         3.80 cm Diastology LVIDs:         2.40 cm LV e' medial:    5.22 cm/s LV PW:         1.10 cm LV E/e' medial:  21.5 LV IVS:         1.00 cm LV e' lateral:   6.09 cm/s                        LV E/e' lateral: 18.4  RIGHT VENTRICLE          IVC RVOT diam:      2.00 cm  IVC diam: 1.10 cm LEFT ATRIUM         Index       RIGHT ATRIUM           Index LA diam:    4.30 cm 2.24 cm/m  RA Pressure: 3.00 mmHg                                 RA Area:  20.90 cm                                 RA Volume:   58.80 ml  30.57 ml/m  AORTIC VALVE LVOT Vmax:   103.00 cm/s LVOT Vmean:  72.100 cm/s LVOT VTI:    0.243 m  AORTA Ao Root diam: 3.20 cm Ao Asc diam:  3.40 cm MITRAL VALVE                TRICUSPID VALVE MV Area (PHT): 2.03 cm     Estimated RAP:  3.00 mmHg MV Decel Time: 374 msec MV E velocity: 112.00 cm/s  SHUNTS MV A velocity: 142.00 cm/s  Systemic VTI:  0.24 m MV E/A ratio:  0.79         Pulmonic Diam: 2.00 cm Lyman Bishop MD Electronically signed by Lyman Bishop MD Signature Date/Time: 10/09/2022/12:01:53 PM    Final    US Venous Img Lower Bilateral  Result Date: 09/27/2022 CLINICAL DATA:  post op eval for dvt EXAM: BILATERAL LOWER EXTREMITY VENOUS DOPPLER ULTRASOUND TECHNIQUE: Gray-scale sonography with graded compression, as well as color Doppler and duplex ultrasound were performed to evaluate the lower extremity deep venous systems from the level of the common femoral vein and including the common femoral, femoral, profunda femoral, popliteal and calf veins including the posterior tibial, peroneal and gastrocnemius veins when visible. The superficial great saphenous vein was also interrogated. Spectral Doppler was utilized to evaluate flow at rest and with distal augmentation maneuvers in the common femoral, femoral and popliteal veins. COMPARISON:  None Available. FINDINGS: RIGHT LOWER EXTREMITY Common Femoral Vein: No evidence of thrombus. Normal compressibility, respiratory phasicity and response to augmentation. Saphenofemoral Junction: No evidence of thrombus. Normal compressibility and flow on color Doppler imaging. Profunda  Femoral Vein: No evidence of thrombus. Normal compressibility and flow on color Doppler imaging. Femoral Vein: No evidence of thrombus. Normal compressibility, respiratory phasicity and response to augmentation. Popliteal Vein: No evidence of thrombus. Normal compressibility, respiratory phasicity and response to augmentation. Calf Veins: No evidence of thrombus. Normal compressibility and flow on color Doppler imaging. LEFT LOWER EXTREMITY Common Femoral Vein: No evidence of thrombus. Normal compressibility, respiratory phasicity and response to augmentation. Saphenofemoral Junction: No evidence of thrombus. Normal compressibility and flow on color Doppler imaging. Profunda Femoral Vein: No evidence of thrombus. Normal compressibility and flow on color Doppler imaging. Femoral Vein: No evidence of thrombus. Normal compressibility, respiratory phasicity and response to augmentation. Popliteal Vein: No evidence of thrombus. Normal compressibility, respiratory phasicity and response to augmentation. Calf Veins: No evidence of thrombus. Normal compressibility and flow on color Doppler imaging. IMPRESSION: No evidence of deep venous thrombosis in either lower extremity. Electronically Signed   By: Margaretha Sheffield M.D.   On: 09/27/2022 16:40   DG Chest Portable 1 View  Result Date: 09/27/2022 CLINICAL DATA:  Short of breath, neck pain EXAM: PORTABLE CHEST 1 VIEW COMPARISON:  09/21/2022 FINDINGS: Single frontal view of the chest demonstrates a stable cardiac silhouette. Mild increased central vascular congestion without airspace disease, effusion, or pneumothorax. There are no acute bony abnormalities. IMPRESSION: 1. Increased central vascular congestion without overt edema. Electronically Signed   By: Randa Ngo M.D.   On: 09/27/2022 15:26    Assessment/Plan 1. Acute low back pain without sciatica, unspecified back pain laterality Pain well-controlled with PRN tylenol and heating pads. No red flag symptoms  present. CTM.    Family/  staff Communication: nursing  Labs/tests ordered:  none.

## 2022-10-30 ENCOUNTER — Telehealth: Payer: Self-pay

## 2022-10-30 NOTE — Telephone Encounter (Signed)
Left a VM for patients son Shanon Brow requesting a call back.

## 2022-10-30 NOTE — Telephone Encounter (Signed)
-----   Message from Kate Sable, MD sent at 10/27/2022  8:49 AM EST ----- Paroxysmal atrial fibrillation noted on monitor. 2% A-fib burden. Continue medications as prescribed.

## 2022-10-30 NOTE — Telephone Encounter (Signed)
Spoke with patients son. He verbalized understanding and agreed with plan. He also confirmed that they will be here on Friday 12/1 for the follow up appointment.

## 2022-11-02 ENCOUNTER — Non-Acute Institutional Stay (SKILLED_NURSING_FACILITY): Payer: Medicare HMO | Admitting: Nurse Practitioner

## 2022-11-02 ENCOUNTER — Encounter: Payer: Self-pay | Admitting: Nurse Practitioner

## 2022-11-02 DIAGNOSIS — D649 Anemia, unspecified: Secondary | ICD-10-CM

## 2022-11-02 DIAGNOSIS — I480 Paroxysmal atrial fibrillation: Secondary | ICD-10-CM | POA: Diagnosis not present

## 2022-11-02 DIAGNOSIS — E039 Hypothyroidism, unspecified: Secondary | ICD-10-CM

## 2022-11-02 DIAGNOSIS — S72144A Nondisplaced intertrochanteric fracture of right femur, initial encounter for closed fracture: Secondary | ICD-10-CM

## 2022-11-02 DIAGNOSIS — N184 Chronic kidney disease, stage 4 (severe): Secondary | ICD-10-CM | POA: Diagnosis not present

## 2022-11-02 DIAGNOSIS — M1711 Unilateral primary osteoarthritis, right knee: Secondary | ICD-10-CM

## 2022-11-02 NOTE — Progress Notes (Signed)
Location:   [facility] Room Number: 616-W Place of Service:  SNF [phone]) Provider:  Sherrie Mustache, NP  PCP: Dewayne Shorter, MD  Patient Care Team: Dewayne Shorter, MD as PCP - General (Family Medicine) Kate Sable, MD as PCP - Cardiology (Cardiology) Theodore Demark, RN (Inactive) as Oncology Nurse Navigator Lauree Chandler, NP as Nurse Practitioner (Geriatric Medicine)  Extended Emergency Contact Information Primary Emergency Contact: Schriver,david Address: [address]          [place], [place] [address] Johnnette Litter of [place] Phone: [phone] Mobile Phone: [phone] Relation: Son  Code Status:  DNR Goals of care: Advanced Directive information    11/02/2022   12:50 PM  Advanced Directives  Does Patient Have a Medical Advance Directive? Yes  Type of Advance Directive Out of facility DNR (pink MOST or yellow form)  Does patient want to make changes to medical advance directive? No - Patient declined     Chief Complaint  Patient presents with   Medical Management of Chronic Issues    Routine Follow Up    HPI:  Pt is a 88 y.o. female seen today for medical management of chronic diseases.  She recently moved from assisted living to SNF. She had a fall and broke her right hip and had surgery and now here for long term rehab. She reports she does not remember that "blacked out"  Report she is doing better. She walks with walker and therapist.  Getting stronger.   Has pain in right knee- uses brace when needed and will take tylenol PRN which is effective.   Constipation- controlled on colace  On synthroid for thryoid- TSH recently normal   Afib- on diltiazem for rate control and eliquis anticoagulation.     Past Medical History:  Diagnosis Date   Breast cancer ([place])    CKD (chronic kidney disease) stage 3, GFR 30-59 ml/min (HCC)    Displaced intertrochanteric fracture of right femur ([place])    Per [facility] EMR, [employer] [facility]   Former smoker    Heart disease    mitral valvular   Hyperlipidemia    Hyperparathyroidism, secondary ([place])    Hypertension    Hypo-osmolality and hyponatremia    Per [facility] EMR, [employer] [facility]   Hypothyroidism    Macular degeneration    Per [employer] EMR, [employer] [facility]   Sensorineural hearing loss    Spinal stenosis of lumbar region 2006   s/p surgery   UTI (urinary tract infection)    Per [facility] EMR, [employer] [facility]   Vitamin D deficiency    Per [facility] EMR, [employer] [facility]   Past Surgical History:  Procedure Laterality Date   ABDOMINAL HYSTERECTOMY     bilateral cataract surgery     BREAST LUMPECTOMY Right 07/22/2021   CHOLECYSTECTOMY  12/04/1978   FRACTURE SURGERY Left 12/29/2018   FEMUR   INTRAMEDULLARY (IM) NAIL INTERTROCHANTERIC Right 09/21/2022   Procedure: INTRAMEDULLARY (IM) NAIL INTERTROCHANTERIC;  Surgeon: Thornton Park, MD;  Location: [facility] ORS;  Service: Orthopedics;  Laterality: Right;   JOINT REPLACEMENT     Bilateral knee   LAPAROSCOPIC SIGMOID COLECTOMY  12/04/2009   secondary to benign mass   MELANOMA EXCISION WITH SENTINEL LYMPH NODE BIOPSY Right 07/22/2021   Procedure: MELANOMA EXCISION WITH SENTINEL LYMPH NODE BIOPSY;  Surgeon: Herbert Pun, MD;  Location: [facility] ORS;  Service: General;  Laterality: Right;   PART [place]  NODE BIOPSY Right 07/22/2021   Procedure: PART MASTECTOMY,RADIO FREQUENCY LOCALIZER,AXILLARY SENTINEL NODE BIOPSY;  Surgeon: Herbert Pun, MD;  Location: [facility] ORS;  Service: General;  Laterality: Right;   REPLACEMENT TOTAL KNEE BILATERAL     SMALL INTESTINE SURGERY  12/04/2008   for SBO   SPINE SURGERY  12/04/2004   [facility], [facility]   tendon repair     achille heall, left     Allergies  Allergen Reactions   Bactrim [Sulfamethoxazole-Trimethoprim] Nausea And Vomiting    Lethargic and weakness    Allergies as of 11/02/2022        Reactions   Bactrim [sulfamethoxazole-trimethoprim] Nausea And Vomiting   Lethargic and weakness        Medication List        Accurate as of November 02, 2022 12:50 PM. If you have any questions, ask your nurse or doctor.          acetaminophen 325 MG tablet Commonly known as: TYLENOL Take 1-2 tablets (325-650 mg total) by mouth every 6 (six) hours as needed for mild pain (pain score 1-3 or temp > 100.5).   alum & mag hydroxide-simeth 200-200-20 MG/5ML suspension Commonly known as: MAALOX/MYLANTA Take 30 mLs by mouth every 4 (four) hours as needed for indigestion.   apixaban 5 MG Tabs tablet Commonly known as: Eliquis Take 1 tablet (5 mg total) by mouth 2 (two) times daily.   bisacodyl 10 MG suppository Commonly known as: DULCOLAX Place 1 suppository (10 mg total) rectally daily as needed for moderate constipation.   Calcium Carbonate-Vitamin D3 600-400 MG-UNIT Tabs Take 1 tablet by mouth 2 (two) times daily.   diltiazem 120 MG 24 hr capsule Commonly known as: Cardizem CD Take 1 capsule (120 mg total) by mouth daily.   docusate sodium 100 MG capsule Commonly known as: COLACE Take 1 capsule (100 mg total) by mouth 2 (two) times daily.   levothyroxine 50 MCG tablet Commonly known as: SYNTHROID TAKE 1 TABLET EVERY DAY ON EMPTY STOMACHWITH A GLASS OF WATER AT LEAST 30-60 MINBEFORE BREAKFAST   Magnesium Oxide 250 MG Tabs Take 1 tablet by mouth every other day. At bedtime   ondansetron 4 MG tablet Commonly known as: ZOFRAN Take 1 tablet (4 mg total) by mouth every 6 (six) hours as needed for nausea.   polyethylene glycol 17 g packet Commonly known as: MIRALAX / GLYCOLAX Take 17 g by mouth daily as needed for mild constipation.   PRESERVISION AREDS PO Take 1 capsule by mouth 2 (two) times daily.        Review of Systems  Constitutional:  Negative for activity change, appetite change, fatigue and unexpected weight change.  HENT:  Negative for congestion  and hearing loss.   Eyes: Negative.   Respiratory:  Negative for cough and shortness of breath.   Cardiovascular:  Negative for chest pain, palpitations and leg swelling.  Gastrointestinal:  Negative for abdominal pain, constipation and diarrhea.  Genitourinary:  Negative for difficulty urinating and dysuria.  Musculoskeletal:  Positive for arthralgias. Negative for myalgias.  Skin:  Negative for color change and wound.  Neurological:  Negative for dizziness and weakness.  Psychiatric/Behavioral:  Negative for agitation, behavioral problems and confusion.     Immunization History  Administered Date(s) Administered   Influenza Split 10/03/2014, 10/04/2015   Influenza, High Dose Seasonal PF 10/04/2019, 09/21/2020, 09/20/2022   Influenza-Unspecified 09/03/2013, 10/01/2016, 10/06/2017, 09/18/2018   Moderna Sars-Covid-2 Vaccination 12/22/2019, 01/19/2020, 08/08/2021, 10/13/2022   Pneumococcal Conjugate-13 10/26/2014  Pneumococcal Polysaccharide-23 04/04/1993, 09/20/2008, 01/28/2016   Tdap 02/14/2013   Zoster Recombinat (Shingrix) 02/07/2017, 06/18/2017   Zoster, Live 12/08/2009   Pertinent  Health Maintenance Due  Topic Date Due   INFLUENZA VACCINE  Completed   DEXA SCAN  Completed      09/24/2022    1:00 AM 09/24/2022   11:00 AM 09/25/2022   12:00 AM 09/25/2022    8:00 AM 09/27/2022    2:43 PM  Fall Risk  Patient Fall Risk Level High fall risk High fall risk High fall risk High fall risk High fall risk   Functional Status Survey:    Vitals:   11/02/22 1246  BP: 102/65  Pulse: 91  Resp: 20  Temp: 97.7 F (36.5 C)  SpO2: 99%  Weight: 184 lb 6.4 oz (83.6 kg)  Height: '5\' 5"'$  (1.651 m)   Body mass index is 30.69 kg/m. Physical Exam Constitutional:      General: She is not in acute distress.    Appearance: She is well-developed. She is not diaphoretic.  HENT:     Head: Normocephalic and atraumatic.     Mouth/Throat:     Pharynx: No oropharyngeal exudate.  Eyes:      Conjunctiva/sclera: Conjunctivae normal.     Pupils: Pupils are equal, round, and reactive to light.  Cardiovascular:     Rate and Rhythm: Normal rate and regular rhythm.     Heart sounds: Normal heart sounds.  Pulmonary:     Effort: Pulmonary effort is normal.     Breath sounds: Normal breath sounds.  Abdominal:     General: Bowel sounds are normal.     Palpations: Abdomen is soft.  Musculoskeletal:     Cervical back: Normal range of motion and neck supple.     Right lower leg: No edema.     Left lower leg: No edema.  Skin:    General: Skin is warm and dry.  Neurological:     Mental Status: She is alert.  Psychiatric:        Mood and Affect: Mood normal.     Labs reviewed: Recent Labs    09/21/22 2149 09/22/22 0314 09/24/22 0526 09/25/22 0304 09/27/22 1446 09/28/22 0000  NA  --    < > 132* 132* 135 135*  K  --    < > 4.2 4.0 3.8 4.2  CL  --    < > 105 103 101 102  CO2  --    < > '24 25 25 '$ 26*  GLUCOSE  --    < > 112* 95 117*  --   BUN  --    < > '16 16 18 20  '$ CREATININE  --    < > 1.26* 1.28* 1.34* 1.3*  CALCIUM  --    < > 7.8* 7.7* 9.2 8.6*  MG 1.6*  --   --   --  2.0  --   PHOS 2.9  --   --   --   --   --    < > = values in this interval not displayed.   Recent Labs    09/27/22 1446  AST 40  ALT 23  ALKPHOS 78  BILITOT 2.2*  PROT 6.8  ALBUMIN 3.3*   Recent Labs    09/20/22 1807 09/22/22 0314 09/24/22 0526 09/25/22 0304 09/27/22 1446 09/28/22 0000  WBC 8.5   < > 8.5 8.6 9.0 7.7  NEUTROABS 5.6  --   --   --  6.1  4,851.00  HGB 11.8*   < > 8.1* 7.6* 9.0* 7.9*  HCT 35.4*   < > 23.9* 22.3* 27.7* 24*  MCV 95.7   < > 92.3 94.1 96.5  --   PLT 247   < > 187 198 337 341   < > = values in this interval not displayed.   Lab Results  Component Value Date   TSH 2.523 09/23/2022   Lab Results  Component Value Date   HGBA1C 5.8 03/08/2020   Lab Results  Component Value Date   CHOL 213 (H) 08/17/2021   HDL 45 (L) 08/17/2021   LDLCALC 114 (H)  08/17/2021   LDLDIRECT 132.0 07/12/2017   TRIG 377 (H) 08/17/2021   CHOLHDL 4.7 08/17/2021    Significant Diagnostic Results in last 30 days:  LONG TERM MONITOR (3-14 DAYS)  Result Date: 10/26/2022 Patch Wear Time:  10 days and 22 hours (2023-11-01T16:33:11-399 to 2023-11-12T14:04:45-0500) Patient had a min HR of 47 bpm, max HR of 176 bpm, and avg HR of 76 bpm. Predominant underlying rhythm was Sinus Rhythm. Bundle Branch Block/IVCD was present. 10 Supraventricular Tachycardia runs occurred, the run with the fastest interval lasting 7 beats with a max rate of 167 bpm, the longest lasting 10 beats with an avg rate of 122 bpm. Atrial Fibrillation occurred (2% burden), ranging from 88-176 bpm (avg of 130 bpm), the longest lasting 3 hours 35 mins with an avg rate of 132 bpm. Junctional Rhythm was present. Atrial Fibrillation was detected within +/- 45 seconds of symptomatic patient event(s). Isolated SVEs were occasional (1.4%, 16640), SVE Couplets were rare (<1.0%, 2781), and SVE Triplets were rare (<1.0%, 563). Isolated VEs were rare  (<1.0%), and no VE Couplets or VE Triplets were present. Conclusion Paroxysmal atrial fibrillation noted on monitor 2% A-fib burden Continue medications as prescribed.   ECHOCARDIOGRAM COMPLETE  Result Date: 10/09/2022    ECHOCARDIOGRAM REPORT   Patient Name:   RHETT NAJERA Date of Exam: 10/09/2022 Medical Rec #:  [id number]      Height:       65.0 in Accession #:    [id number]     Weight:       187.2 lb Date of Birth:  [birth year]      BSA:          1.923 m Patient Age:    31 years       BP:           118/56 mmHg Patient Gender: F              HR:           65 bpm. Exam Location:  [address] Procedure: 2D Echo, Cardiac Doppler and Color Doppler Indications:    I48.0 Paroxysmal atrial fibrillation  History:        Patient has no prior history of Echocardiogram examinations.                 Arrythmias:Atrial Fibrillation; Risk Factors:Hypertension,                  Dyslipidemia and Former Smoker.  Sonographer:    Coralyn Helling RDCS Referring Phys: [id number] [facility]  Sonographer Comments: Technically challenging study due to limited acoustic windows and Technically difficult study due to poor echo windows. Image acquisition challenging due to respiratory motion. IMPRESSIONS  1. Left ventricular ejection fraction, by estimation, is 60 to 65%. The left ventricle has normal function. The left ventricle has no regional wall motion abnormalities.  Left ventricular diastolic parameters are consistent with Grade I diastolic dysfunction (impaired relaxation). Elevated left ventricular end-diastolic pressure. The E/e' is 80.  2. Right ventricular systolic function is normal. The right ventricular size is normal.  3. The mitral valve is degenerative. Mild mitral valve regurgitation. Moderate mitral annular calcification.  4. The aortic valve is tricuspid. Aortic valve regurgitation is not visualized. Aortic valve sclerosis/calcification is present, without any evidence of aortic stenosis.  5. The inferior vena cava is normal in size with greater than 50% respiratory variability, suggesting right atrial pressure of 3 mmHg. Comparison(s): No prior Echocardiogram. FINDINGS  Left Ventricle: Left ventricular ejection fraction, by estimation, is 60 to 65%. The left ventricle has normal function. The left ventricle has no regional wall motion abnormalities. The left ventricular internal cavity size was normal in size. There is  no left ventricular hypertrophy. Left ventricular diastolic parameters are consistent with Grade I diastolic dysfunction (impaired relaxation). Elevated left ventricular end-diastolic pressure. The E/e' is 83. Right Ventricle: The right ventricular size is normal. No increase in right ventricular wall thickness. Right ventricular systolic function is normal. Left Atrium: Left atrial size was normal in size. Right Atrium: Right atrial size was normal in size.  Pericardium: There is no evidence of pericardial effusion. Mitral Valve: The mitral valve is degenerative in appearance. Moderate mitral annular calcification. Mild mitral valve regurgitation. Tricuspid Valve: The tricuspid valve is grossly normal. Tricuspid valve regurgitation is trivial. Aortic Valve: The aortic valve is tricuspid. Aortic valve regurgitation is not visualized. Aortic valve sclerosis/calcification is present, without any evidence of aortic stenosis. Pulmonic Valve: The pulmonic valve was grossly normal. Pulmonic valve regurgitation is trivial. Aorta: The aortic root and ascending aorta are structurally normal, with no evidence of dilitation. Venous: The inferior vena cava is normal in size with greater than 50% respiratory variability, suggesting right atrial pressure of 3 mmHg. IAS/Shunts: No atrial level shunt detected by color flow Doppler.  LEFT VENTRICLE PLAX 2D LVIDd:         3.80 cm Diastology LVIDs:         2.40 cm LV e' medial:    5.22 cm/s LV PW:         1.10 cm LV E/e' medial:  21.5 LV IVS:        1.00 cm LV e' lateral:   6.09 cm/s                        LV E/e' lateral: 18.4  RIGHT VENTRICLE          IVC RVOT diam:      2.00 cm  IVC diam: 1.10 cm LEFT ATRIUM         Index       RIGHT ATRIUM           Index LA diam:    4.30 cm 2.24 cm/m  RA Pressure: 3.00 mmHg                                 RA Area:     20.90 cm                                 RA Volume:   58.80 ml  30.57 ml/m  AORTIC VALVE LVOT Vmax:   103.00 cm/s LVOT Vmean:  72.100 cm/s LVOT VTI:  0.243 m  AORTA Ao Root diam: 3.20 cm Ao Asc diam:  3.40 cm MITRAL VALVE                TRICUSPID VALVE MV Area (PHT): 2.03 cm     Estimated RAP:  3.00 mmHg MV Decel Time: 374 msec MV E velocity: 112.00 cm/s  SHUNTS MV A velocity: 142.00 cm/s  Systemic VTI:  0.24 m MV E/A ratio:  0.79         Pulmonic Diam: 2.00 cm Lyman Bishop MD Electronically signed by Lyman Bishop MD Signature Date/Time: 10/09/2022/12:01:53 PM    Final      Assessment/Plan 1. Paroxysmal atrial fibrillation (HCC) -rate controlled, continues on diltiazem  2. Hypothyroidism, unspecified type TSH at goal, continues synthroid  3. CKD (chronic kidney disease), stage IV (HCC) -Chronic and stable Encourage proper hydration Follow metabolic panel Avoid nephrotoxic meds (NSAIDS)  4. Anemia, unspecified type Will follow up cbc with iron studies at this time  5. Primary osteoarthritis of right knee -stable pain of right knee, continues to use tylenol PRN  6. Closed nondisplaced intertrochanteric fracture of right femur, initial encounter ([place]) -s/p IM nail placement in October, doing well at this time. Pain controlled and continues to work with PT.    Carlos American. [place], [facility] [phone]

## 2022-11-02 NOTE — Progress Notes (Signed)
Cardiology Clinic Note   Patient Name: Pamela Paul Date of Encounter: 11/03/2022  Primary Care Provider:  Dewayne Shorter, MD Primary Cardiologist:  Kate Sable, MD  Patient Profile    88 year-old female with a history of hypertension, paroxysmal atrial fibrillation, CKD III, hypothyroidism, hyperlipidemia, history of breast cancer, who presents today for follow-up.  Past Medical History    Past Medical History:  Diagnosis Date   Breast cancer ([place])    CKD (chronic kidney disease) stage 3, GFR 30-59 ml/min (HCC)    Displaced intertrochanteric fracture of right femur ([place])    Per [facility] EMR, [employer] [facility]   Former smoker    Heart disease    mitral valvular   Hyperlipidemia    Hyperparathyroidism, secondary ([place])    Hypertension    Hypo-osmolality and hyponatremia    Per [facility] EMR, [employer] [facility]   Hypothyroidism    Macular degeneration    Per [employer] EMR, [employer] [facility]   Sensorineural hearing loss    Spinal stenosis of lumbar region 2006   s/p surgery   UTI (urinary tract infection)    Per [facility] EMR, [employer] [facility]   Vitamin D deficiency    Per [facility] EMR, [employer] [facility]   Past Surgical History:  Procedure Laterality Date   ABDOMINAL HYSTERECTOMY     bilateral cataract surgery     BREAST LUMPECTOMY Right 07/22/2021   CHOLECYSTECTOMY  12/04/1978   FRACTURE SURGERY Left 12/29/2018   FEMUR   INTRAMEDULLARY (IM) NAIL INTERTROCHANTERIC Right 09/21/2022   Procedure: INTRAMEDULLARY (IM) NAIL INTERTROCHANTERIC;  Surgeon: Thornton Park, MD;  Location: [facility] ORS;  Service: Orthopedics;  Laterality: Right;   JOINT REPLACEMENT     Bilateral knee   LAPAROSCOPIC SIGMOID COLECTOMY  12/04/2009   secondary to benign mass   MELANOMA EXCISION WITH SENTINEL LYMPH NODE BIOPSY Right 07/22/2021   Procedure: MELANOMA EXCISION WITH SENTINEL LYMPH NODE BIOPSY;  Surgeon: Herbert Pun, MD;  Location: [facility] ORS;  Service: General;   Laterality: Right;   PART [place] NODE BIOPSY Right 07/22/2021   Procedure: PART MASTECTOMY,RADIO FREQUENCY LOCALIZER,AXILLARY SENTINEL NODE BIOPSY;  Surgeon: Herbert Pun, MD;  Location: [facility] ORS;  Service: General;  Laterality: Right;   REPLACEMENT TOTAL KNEE BILATERAL     SMALL INTESTINE SURGERY  12/04/2008   for SBO   SPINE SURGERY  12/04/2004   [place], [facility]   tendon repair     achille heall, left     Allergies  Allergies  Allergen Reactions   Bactrim [Sulfamethoxazole-Trimethoprim] Nausea And Vomiting    Lethargic and weakness    History of Present Illness    Pamela Paul is a 88 year-old female with a past medical history of hypertension, paroxysmal atrial fibrillation on chronic anticoagulation of apixaban, hyperlipidemia, hypothyroidism, history of breast cancer, and CKD III.  She was last seen in clinic 10/02/2022 by Dr Garen Lah to establish care due to her history of atrial fibrillation after an evaluation in the [facility] emergency department. She resides at [facility] and was walking too fast and sustained a fall. While en route to the emergency department EMS noted she was going in and out of atrial fibrillation. ED EKG revealed atrial fibrillation RVR with a rate of  152 bpm. She was started on apixaban and Cardizem and was sent back to the facility. She was scheduled for an echocardiogram and cardiac monitor  as well as started on furosemide 20 mg  x 5 days.   Echocardiogram revealed LVEF 60-65%, no regional wall motion abnormalities, G1DD, mild MR, moderate mitral annular calcification, aortic valve sclerosis without stenosis.  Heart monitor revealed paroxysmal atrial fibrillation was noted with 2% burden. She was continued on her current medication regimen.  She returns to clinic today stating that she has been tolerating fairly well.  She has been tolerating his physical therapy at [facility] without incident.   She is accompanied by family today as well but states that she has been doing well.  She has moved from assisted living to long-term care at [facility].  She denies any chest pain, shortness of breath, or palpitations.  She denies any bleeding from her apixaban.  Has had negative recent hospitalizations or visits to the emergency department.  Home Medications    Current Outpatient Medications  Medication Sig Dispense Refill   acetaminophen (TYLENOL) 325 MG tablet Take 1-2 tablets (325-650 mg total) by mouth every 6 (six) hours as needed for mild pain (pain score 1-3 or temp > 100.5).     alum & mag hydroxide-simeth (MAALOX/MYLANTA) 200-200-20 MG/5ML suspension Take 30 mLs by mouth every 4 (four) hours as needed for indigestion. 355 mL 0   apixaban (ELIQUIS) 5 MG TABS tablet Take 1 tablet (5 mg total) by mouth 2 (two) times daily. 60 tablet 0   bisacodyl (DULCOLAX) 10 MG suppository Place 1 suppository (10 mg total) rectally daily as needed for moderate constipation. 12 suppository 0   Calcium Carbonate-Vitamin D3 600-400 MG-UNIT TABS Take 1 tablet by mouth 2 (two) times daily.     diltiazem (CARDIZEM CD) 120 MG 24 hr capsule Take 1 capsule (120 mg total) by mouth daily. 30 capsule 2   docusate sodium (COLACE) 100 MG capsule Take 1 capsule (100 mg total) by mouth 2 (two) times daily. 10 capsule 0   levothyroxine (SYNTHROID) 50 MCG tablet TAKE 1 TABLET EVERY DAY ON EMPTY STOMACHWITH A GLASS OF WATER AT LEAST 30-60 MINBEFORE BREAKFAST 30 tablet 11   Magnesium Oxide 250 MG TABS Take 1 tablet by mouth every other day. At bedtime     Multiple Vitamins-Minerals (PRESERVISION AREDS PO) Take 1 capsule by mouth 2 (two) times daily.     ondansetron (ZOFRAN) 4 MG tablet Take 1 tablet (4 mg total) by mouth every 6 (six) hours as needed for nausea. 20 tablet 0   polyethylene glycol (MIRALAX / GLYCOLAX) 17 g packet Take 17 g by mouth daily as needed for mild constipation. 14 each 0   No current  facility-administered medications for this visit.     Family History    Family History  Problem Relation Age of Onset   Arthritis Mother    Stroke Mother    Hyperlipidemia Father    Cancer Father    Heart disease Paternal Aunt    She indicated that her mother is deceased. She indicated that her father is deceased. She indicated that the status of her paternal aunt is unknown.  Social History    Social History   Socioeconomic History   Marital status: Widowed    Spouse name: Not on file   Number of children: 5   Years of education: Not on file   Highest education level: Not on file  Occupational History   Occupation: Preschool teacher/director    Comment: Retired  Tobacco Use   Smoking status: Former    Types: Cigarettes    Quit date: 03/15/1961    Years since quitting: 23.6  Smokeless tobacco: Never   Tobacco comments:    Quit many years ago.  Vaping Use   Vaping Use: Never used  Substance and Sexual Activity   Alcohol use: Not Currently   Drug use: No   Sexual activity: Never  Other Topics Concern   Not on file  Social History Narrative   2 daughters/3 sons      Has living will   Son Shanon Brow is [facility] POA   Has DNR   Would accept hospital care   Would consider tube feeds--but not if persistent cognitive unawareness   Social Determinants of Health   Financial Resource Strain: Low Risk  (03/02/2022)   Overall Financial Resource Strain (CARDIA)    Difficulty of Paying Living Expenses: Not hard at all  Food Insecurity: No Food Insecurity (09/22/2022)   Hunger Vital Sign    Worried About Running Out of Food in the Last Year: Never true    Ran Out of Food in the Last Year: Never true  Transportation Needs: No Transportation Needs (09/22/2022)   PRAPARE - Hydrologist (Medical): No    Lack of Transportation (Non-Medical): No  Physical Activity: Insufficiently Active (03/02/2022)   Exercise Vital Sign    Days of Exercise per Week: 2  days    Minutes of Exercise per Session: 60 min  Stress: No Stress Concern Present (03/02/2022)   [place]    Feeling of Stress : Not at all  Social Connections: Unknown (03/02/2022)   Social Connection and Isolation Panel [NHANES]    Frequency of Communication with Friends and Family: More than three times a week    Frequency of Social Gatherings with Friends and Family: Twice a week    Attends Religious Services: Not on Advertising copywriter or Organizations: Yes    Attends Archivist Meetings: Not on file    Marital Status: Widowed  Intimate Partner Violence: Not At Risk (09/22/2022)   Humiliation, Afraid, Rape, and Kick questionnaire    Fear of Current or Ex-Partner: No    Emotionally Abused: No    Physically Abused: No    Sexually Abused: No     Review of Systems    General:  No chills, fever, night sweats or weight changes.  Cardiovascular:  No chest pain, dyspnea on exertion, endorses peripheral edema edema, orthopnea, palpitations, paroxysmal nocturnal dyspnea. Dermatological: No rash, lesions/masses Respiratory: No cough, dyspnea Urologic: No hematuria, dysuria Abdominal:   No nausea, vomiting, diarrhea, bright red blood per rectum, melena, or hematemesis Neurologic:  No visual changes, wkns, changes in mental status. All other systems reviewed and are otherwise negative except as noted above.   Physical Exam    VS:  BP (!) 126/56 (BP Location: Left Arm, Patient Position: Sitting, Cuff Size: Normal)   Pulse 84   Ht '5\' 5"'$  (1.651 m)   Wt 184 lb (83.5 kg)   SpO2 98%   BMI 30.62 kg/m  , BMI Body mass index is 30.62 kg/m.     GEN: Well nourished, well developed, in no acute distress.  Sitting in a wheelchair. HEENT: normal.  Glasses on. Neck: Supple, no JVD, carotid bruits, or masses. Cardiac: RRR, no murmurs, rubs, or gallops. No clubbing, cyanosis, edema, knee-high compression  stockings on bilaterally.  Radials 2+/PT 2+ and equal bilaterally.  Respiratory:  Respirations regular and unlabored, clear to auscultation bilaterally. GI: Soft, nontender, nondistended, BS + x  4. MS: no deformity or atrophy. Skin: warm and dry, no rash. Neuro:  Strength and sensation are intact. Psych: Normal affect.  Accessory Clinical Findings    ECG personally reviewed by me today-sinus rhythm with first-degree AV block and a right bundle branch block with rate 84- No acute changes  Lab Results  Component Value Date   WBC 7.7 09/28/2022   HGB 7.9 (A) 09/28/2022   HCT 24 (A) 09/28/2022   MCV 96.5 09/27/2022   PLT 341 09/28/2022   Lab Results  Component Value Date   CREATININE 1.3 (A) 09/28/2022   BUN 20 09/28/2022   NA 135 (A) 09/28/2022   K 4.2 09/28/2022   CL 102 09/28/2022   CO2 26 (A) 09/28/2022   Lab Results  Component Value Date   ALT 23 09/27/2022   AST 40 09/27/2022   ALKPHOS 78 09/27/2022   BILITOT 2.2 (H) 09/27/2022   Lab Results  Component Value Date   CHOL 213 (H) 08/17/2021   HDL 45 (L) 08/17/2021   LDLCALC 114 (H) 08/17/2021   LDLDIRECT 132.0 07/12/2017   TRIG 377 (H) 08/17/2021   CHOLHDL 4.7 08/17/2021    Lab Results  Component Value Date   HGBA1C 5.8 03/08/2020    Assessment & Plan   1.  Paroxysmal atrial fibrillation currently in sinus rhythm.  She denies any associated symptoms to being in atrial fibrillation as far as denying fatigue, shortness of breath, palpitations, and chest pain.  She has been continued on diltiazem 120 mg daily as well as apixaban 5 mg daily for CHA2DS2-VASc score of at least 4 (age, sex, hypertension).  Long-term monitor revealed paroxysmal atrial fibrillation noted on the monitor to 2% a fib burden.  Recent echocardiogram done revealed LVEF of 60-65%, with no regional wall motion abnormalities, G1 DD, mild mitral regurgitation.  2.  Hypertension with blood pressure today 126/56.  She is continued on diltiazem 120  mg.  Blood pressure remained stable today.  Have advised family if they noted to the MyChart from the facility drop in her blood pressure or an elevation in her blood pressure that is consistent past her norm that they can notify us via MyChart as well.  3.  Hypothyroidism she is continued on levothyroxine.  This continues to be managed by her PCP.  4.  Peripheral edema that is improving.  She has been more active and being able to participate in physical therapy.  She is continued to wear knee-high compression stockings.  5.  Bifascicular block noted on EKG with first-degree AV block and a right bundle branch block.  Patient remains asymptomatic without dizziness, lightheadedness, palpitations, syncope/near syncope.  Continue to monitor with surveillance EKGs.  6.  Disposition patient return to clinic to see MD/APP in 4 months or sooner if needed.  Anayely Constantine, NP 11/03/2022, 11:22 AM

## 2022-11-03 ENCOUNTER — Encounter: Payer: Self-pay | Admitting: Cardiology

## 2022-11-03 ENCOUNTER — Ambulatory Visit: Payer: Medicare HMO | Attending: Cardiology | Admitting: Cardiology

## 2022-11-03 VITALS — BP 126/56 | HR 84 | Ht 65.0 in | Wt 184.0 lb

## 2022-11-03 DIAGNOSIS — I452 Bifascicular block: Secondary | ICD-10-CM

## 2022-11-03 DIAGNOSIS — R609 Edema, unspecified: Secondary | ICD-10-CM

## 2022-11-03 DIAGNOSIS — E039 Hypothyroidism, unspecified: Secondary | ICD-10-CM | POA: Diagnosis not present

## 2022-11-03 DIAGNOSIS — I480 Paroxysmal atrial fibrillation: Secondary | ICD-10-CM | POA: Diagnosis not present

## 2022-11-03 DIAGNOSIS — I10 Essential (primary) hypertension: Secondary | ICD-10-CM

## 2022-11-03 NOTE — Patient Instructions (Signed)
Medication Instructions:  No changes *If you need a refill on your cardiac medications before your next appointment, please call your pharmacy*   Lab Work: None ordered If you have labs (blood work) drawn today and your tests are completely normal, you will receive your results only by: [place] (if you have MyChart) OR A paper copy in the mail If you have any lab test that is abnormal or we need to change your treatment, we will call you to review the results.   Testing/Procedures: None ordered   Follow-Up: At [facility], you and your health needs are our priority.  As part of our continuing mission to provide you with exceptional heart care, we have created designated Provider Care Teams.  These Care Teams include your primary Cardiologist (physician) and Advanced Practice Providers (APPs -  Physician Assistants and Nurse Practitioners) who all work together to provide you with the care you need, when you need it.  We recommend signing up for the patient portal called "MyChart".  Sign up information is provided on this After Visit Summary.  MyChart is used to connect with patients for Virtual Visits (Telemedicine).  Patients are able to view lab/test results, encounter notes, upcoming appointments, etc.  Non-urgent messages can be sent to your provider as well.   To learn more about what you can do with MyChart, go to [email or web address].    Your next appointment:   4 month(s)  The format for your next appointment:   In Person  Provider:   You may see Kate Sable, MD or one of the following Advanced Practice Providers on your designated Care Team:   Murray Hodgkins, NP Christell Faith, PA-C Cadence Kathlen Mody, PA-C Gerrie Nordmann, NP

## 2022-11-06 LAB — CBC AND DIFFERENTIAL
HCT: 36 (ref 36–46)
Hemoglobin: 11.9 — AB (ref 12.0–16.0)
Platelets: 334 10*3/uL (ref 150–400)
WBC: 7.2

## 2022-11-06 LAB — IRON,TIBC AND FERRITIN PANEL
%SAT: 16
Ferritin: 146
Iron: 39
TIBC: 250

## 2022-11-06 LAB — CBC: RBC: 3.77 — AB (ref 3.87–5.11)

## 2022-11-13 ENCOUNTER — Ambulatory Visit: Payer: Medicare HMO | Admitting: Physician Assistant

## 2022-11-13 VITALS — BP 114/75 | HR 84 | Ht 65.0 in | Wt 187.0 lb

## 2022-11-13 DIAGNOSIS — R339 Retention of urine, unspecified: Secondary | ICD-10-CM | POA: Diagnosis not present

## 2022-11-13 LAB — BLADDER SCAN AMB NON-IMAGING: Scan Result: 28

## 2022-11-13 NOTE — Progress Notes (Signed)
Patient presented to clinic today accompanied by her son for repeat PVR 1 month after passing a voiding trial with Zara Council after experiencing postoperative urinary retention following repair of a right femur fracture in October 2023.  She reports she has been able to void well over the past month and has no acute concerns.  She denies dysuria, urgency, frequency, and incontinence.  PVR 28 mL.  Results for orders placed or performed in visit on 11/13/22  Bladder Scan (Post Void Residual) in office  Result Value Ref Range   Scan Result 28 ml     She is emptying appropriately today and denies any urinary complaints.  I encouraged her to share her history of postoperative urinary retention with future surgical teams due to the risk for recurrence.  She expressed understanding.  Otherwise, we will release her from urologic care at this time.  We discussed that she may follow-up with [place] as needed.  She is in agreement with this plan.  Debroah Loop, PA-C  11/13/22 4:18 PM  I spent 12 minutes on the day of the encounter to include pre-visit record review, face-to-face time with the patient, and post-visit ordering of tests.

## 2022-11-15 NOTE — Telephone Encounter (Signed)
Error

## 2022-11-17 ENCOUNTER — Encounter: Payer: Self-pay | Admitting: Student

## 2022-11-17 ENCOUNTER — Ambulatory Visit: Payer: Medicare HMO | Admitting: Internal Medicine

## 2022-11-24 ENCOUNTER — Other Ambulatory Visit: Payer: Medicare HMO

## 2022-11-25 ENCOUNTER — Telehealth: Payer: Self-pay | Admitting: Nurse Practitioner

## 2022-11-25 NOTE — Telephone Encounter (Signed)
Staff called: the patient had diarrhea about 2-3 days, feels weak, needs 2 person instead of one assist for transfer now, vitals stable except Sat O2 down to 89% when transferred to commode. Ordered stat CBC/diff, CMP, Imodium for now.

## 2022-11-26 LAB — BASIC METABOLIC PANEL
BUN: 24 — AB (ref 4–21)
CO2: 26 — AB (ref 13–22)
Chloride: 97 — AB (ref 99–108)
Creatinine: 1.8 — AB (ref 0.5–1.1)
Glucose: 145
Potassium: 3.7 mEq/L (ref 3.5–5.1)
Sodium: 134 — AB (ref 137–147)

## 2022-11-26 LAB — HEPATIC FUNCTION PANEL
ALT: 15 U/L (ref 7–35)
AST: 21 (ref 13–35)
Alkaline Phosphatase: 105 (ref 25–125)
Bilirubin, Total: 0.7

## 2022-11-26 LAB — COMPREHENSIVE METABOLIC PANEL
Albumin: 3.6 (ref 3.5–5.0)
Calcium: 9.4 (ref 8.7–10.7)
Globulin: 3
eGFR: 26

## 2022-11-26 LAB — CBC AND DIFFERENTIAL
HCT: 37 (ref 36–46)
Hemoglobin: 12.2 (ref 12.0–16.0)
Neutrophils Absolute: 6363
Platelets: 306 10*3/uL (ref 150–400)
WBC: 9

## 2022-11-26 LAB — CBC: RBC: 3.89 (ref 3.87–5.11)

## 2022-11-29 ENCOUNTER — Encounter: Payer: Self-pay | Admitting: Student

## 2022-11-29 ENCOUNTER — Non-Acute Institutional Stay (SKILLED_NURSING_FACILITY): Payer: Medicare HMO | Admitting: Student

## 2022-11-29 DIAGNOSIS — R197 Diarrhea, unspecified: Secondary | ICD-10-CM | POA: Diagnosis not present

## 2022-11-29 DIAGNOSIS — E039 Hypothyroidism, unspecified: Secondary | ICD-10-CM

## 2022-11-29 DIAGNOSIS — N184 Chronic kidney disease, stage 4 (severe): Secondary | ICD-10-CM

## 2022-11-29 DIAGNOSIS — N2581 Secondary hyperparathyroidism of renal origin: Secondary | ICD-10-CM

## 2022-11-29 DIAGNOSIS — I480 Paroxysmal atrial fibrillation: Secondary | ICD-10-CM

## 2022-11-29 DIAGNOSIS — I10 Essential (primary) hypertension: Secondary | ICD-10-CM | POA: Diagnosis not present

## 2022-11-29 DIAGNOSIS — S72144A Nondisplaced intertrochanteric fracture of right femur, initial encounter for closed fracture: Secondary | ICD-10-CM

## 2022-11-29 NOTE — Addendum Note (Signed)
Addended by: Dewayne Shorter on: 11/29/2022 04:26 PM   Modules accepted: Level of Service

## 2022-11-29 NOTE — Progress Notes (Signed)
Location:  Other [facility].  Nursing Home Room Number: [facility] 513A Place of Service:  SNF [phone]) Provider:  Dr. Sherri Rad, MD  Patient Care Team: Earnestine Mealing, MD as PCP - General (Family Medicine) Debbe Odea, MD as PCP - Cardiology (Cardiology) Scarlett Presto, RN (Inactive) as Oncology Nurse Navigator Sharon Seller, NP as Nurse Practitioner (Geriatric Medicine)  Extended Emergency Contact Information Primary Emergency Contact: Hackert,david Address: [address]          [place], [place] [address] D[place] of [place] Home Phone: [phone] Mobile Phone: [phone] Relation: Son  Code Status:  DNR Goals of care: Advanced Directive information    11/29/2022    1:28 PM  Advanced Directives  Does Patient Have a Medical Advance Directive? Yes  Type of Estate agent of [place];Out of facility DNR (pink MOST or yellow form);Living will  Does patient want to make changes to medical advance directive? No - Patient declined  Copy of Healthcare Power of Attorney in Chart? Yes - validated most recent copy scanned in chart (See row information)     Chief Complaint  Patient presents with   Medical Management of Chronic Issues    Medical Management of Chronic Issues.     HPI:  Pt is a 88 y.o. female seen today for medical management of chronic diseases and follow up after episodes of diarrhea over the weekend. Patient states she has not had any additional episodes of diarrhea. She is feeling well today. Continues to have good fluid intake. Appetite is stable. Denies abdominal pain.    Past Medical History:  Diagnosis Date   Breast cancer (HCC)    CKD (chronic kidney disease) stage 3, GFR 30-59 ml/min (HCC)    Displaced intertrochanteric fracture of right femur (HCC)    Per [facility] EMR, [employer] Care   Former smoker    Heart disease    mitral valvular   Hyperlipidemia    Hyperparathyroidism, secondary  (HCC)    Hypertension    Hypo-osmolality and hyponatremia    Per [facility] EMR, [employer] Care   Hypothyroidism    Macular degeneration    Per [employer] [facility] EMR, [employer] Care   Sensorineural hearing loss    Spinal stenosis of lumbar region 2006   s/p surgery   UTI (urinary tract infection)    Per [facility] EMR, [employer] Care   Vitamin D deficiency    Per [facility] EMR, [employer] Care   Past Surgical History:  Procedure Laterality Date   ABDOMINAL HYSTERECTOMY     bilateral cataract surgery     BREAST LUMPECTOMY Right 07/22/2021   CHOLECYSTECTOMY  12/04/1978   FRACTURE SURGERY Left 12/29/2018   FEMUR   INTRAMEDULLARY (IM) NAIL INTERTROCHANTERIC Right 09/21/2022   Procedure: INTRAMEDULLARY (IM) NAIL INTERTROCHANTERIC;  Surgeon: Juanell Fairly, MD;  Location: [facility] ORS;  Service: Orthopedics;  Laterality: Right;   JOINT REPLACEMENT     Bilateral knee   LAPAROSCOPIC SIGMOID COLECTOMY  12/04/2009   secondary to benign mass   MELANOMA EXCISION WITH SENTINEL LYMPH NODE BIOPSY Right 07/22/2021   Procedure: MELANOMA EXCISION WITH SENTINEL LYMPH NODE BIOPSY;  Surgeon: Carolan Shiver, MD;  Location: [facility] ORS;  Service: General;  Laterality: Right;   PART MASTECTOMY,RADIO FREQUENCY LOCALIZER,AXILLARY SENTINEL NODE BIOPSY Right 07/22/2021   Procedure: PART MASTECTOMY,RADIO FREQUENCY LOCALIZER,AXILLARY SENTINEL NODE BIOPSY;  Surgeon: Carolan Shiver, MD;  Location: [facility] ORS;  Service: General;  Laterality: Right;  REPLACEMENT TOTAL KNEE BILATERAL     SMALL INTESTINE SURGERY  12/04/2008   for SBO   SPINE SURGERY  12/04/2004   [facility], [facility]   tendon repair     achille heall, left     Allergies  Allergen Reactions   Bactrim [Sulfamethoxazole-Trimethoprim] Nausea And Vomiting    Lethargic and weakness    Outpatient Encounter Medications as of 11/29/2022  Medication Sig   acetaminophen (TYLENOL) 325 MG tablet Take 1-2 tablets (325-650 mg total) by mouth every  6 (six) hours as needed for mild pain (pain score 1-3 or temp > 100.5).   alum & mag hydroxide-simeth (MAALOX/MYLANTA) 200-200-20 MG/5ML suspension Take 30 mLs by mouth every 4 (four) hours as needed for indigestion.   apixaban (ELIQUIS) 5 MG TABS tablet Take 1 tablet (5 mg total) by mouth 2 (two) times daily.   bisacodyl (DULCOLAX) 10 MG suppository Place 1 suppository (10 mg total) rectally daily as needed for moderate constipation.   Calcium Carbonate-Vitamin D3 600-400 MG-UNIT TABS Take 1 tablet by mouth 2 (two) times daily.   diltiazem (CARDIZEM CD) 120 MG 24 hr capsule Take 1 capsule (120 mg total) by mouth daily.   levothyroxine (SYNTHROID) 50 MCG tablet TAKE 1 TABLET EVERY DAY ON EMPTY STOMACHWITH A GLASS OF WATER AT LEAST 30-60 MINBEFORE BREAKFAST   loperamide (IMODIUM) 1 MG/5ML solution Take 30 mLs by mouth every 4 (four) hours as needed for diarrhea or loose stools.   Magnesium Oxide 250 MG TABS Take 1 tablet by mouth every other day. At bedtime   Multiple Vitamins-Minerals (PRESERVISION AREDS PO) Take 1 capsule by mouth 2 (two) times daily.   ondansetron (ZOFRAN) 4 MG tablet Take 1 tablet (4 mg total) by mouth every 6 (six) hours as needed for nausea.   polyethylene glycol (MIRALAX / GLYCOLAX) 17 g packet Take 17 g by mouth daily as needed for mild constipation.   [DISCONTINUED] docusate sodium (COLACE) 100 MG capsule Take 1 capsule (100 mg total) by mouth 2 (two) times daily.   No facility-administered encounter medications on file as of 11/29/2022.    Review of Systems  All other systems reviewed and are negative.   Immunization History  Administered Date(s) Administered   Influenza Split 10/03/2014, 10/04/2015   Influenza, High Dose Seasonal PF 10/04/2019, 09/21/2020, 09/20/2022   Influenza-Unspecified 09/03/2013, 10/01/2016, 10/06/2017, 09/18/2018   Moderna Sars-Covid-2 Vaccination 12/22/2019, 01/19/2020, 08/08/2021, 10/13/2022   Pneumococcal Conjugate-13 10/26/2014    Pneumococcal Polysaccharide-23 04/04/1993, 09/20/2008, 01/28/2016   Tdap 02/14/2013   Zoster Recombinat (Shingrix) 02/07/2017, 06/18/2017   Zoster, Live 12/08/2009   Pertinent  Health Maintenance Due  Topic Date Due   INFLUENZA VACCINE  Completed   DEXA SCAN  Completed      09/24/2022    1:00 AM 09/24/2022   11:00 AM 09/25/2022   12:00 AM 09/25/2022    8:00 AM 09/27/2022    2:43 PM  Fall Risk  Patient Fall Risk Level High fall risk High fall risk High fall risk High fall risk High fall risk   Functional Status Survey:    Vitals:   11/29/22 1311  BP: 119/72  Pulse: 80  Resp: 20  Temp: 98 F (36.7 C)  SpO2: 99%  Weight: 167 lb (75.8 kg)  Height: 5\' 5"  (1.651 m)   Body mass index is 27.79 kg/m. Physical Exam Vitals reviewed.  Constitutional:      Appearance: Normal appearance.  Cardiovascular:     Rate and Rhythm: Normal rate. Rhythm irregular.  Pulses: Normal pulses.     Heart sounds: Normal heart sounds.  Pulmonary:     Effort: Pulmonary effort is normal.     Breath sounds: Normal breath sounds.  Abdominal:     General: Bowel sounds are normal.     Palpations: Abdomen is soft.  Skin:    General: Skin is warm and dry.     Capillary Refill: Capillary refill takes less than 2 seconds.  Neurological:     General: No focal deficit present.     Mental Status: She is alert. Mental status is at baseline.    Labs reviewed: Recent Labs    09/21/22 2149 09/22/22 0314 09/24/22 0526 09/25/22 0304 09/27/22 1446 09/28/22 0000 11/26/22 0000  NA  --    < > 132* 132* 135 135* 134*  K  --    < > 4.2 4.0 3.8 4.2 3.7  CL  --    < > 105 103 101 102 97*  CO2  --    < > 24 25 25  26* 26*  GLUCOSE  --    < > 112* 95 117*  --   --   BUN  --    < > 16 16 18 20  24*  CREATININE  --    < > 1.26* 1.28* 1.34* 1.3* 1.8*  CALCIUM  --    < > 7.8* 7.7* 9.2 8.6* 9.4  MG 1.6*  --   --   --  2.0  --   --   PHOS 2.9  --   --   --   --   --   --    < > = values in this interval  not displayed.   Recent Labs    09/27/22 1446 11/26/22 0000  AST 40 21  ALT 23 15  ALKPHOS 78 105  BILITOT 2.2*  --   PROT 6.8  --   ALBUMIN 3.3* 3.6   Recent Labs    09/24/22 0526 09/25/22 0304 09/27/22 1446 09/28/22 0000 11/06/22 0000 11/26/22 0000  WBC 8.5 8.6 9.0 7.7 7.2 9.0  NEUTROABS  --   --  6.1 4,851.00  --  6,363.00  HGB 8.1* 7.6* 9.0* 7.9* 11.9* 12.2  HCT 23.9* 22.3* 27.7* 24* 36 37  MCV 92.3 94.1 96.5  --   --   --   PLT 187 198 337 341 334 306   Lab Results  Component Value Date   TSH 2.523 09/23/2022   Lab Results  Component Value Date   HGBA1C 5.8 03/08/2020   Lab Results  Component Value Date   CHOL 213 (H) 08/17/2021   HDL 45 (L) 08/17/2021   LDLCALC 114 (H) 08/17/2021   LDLDIRECT 132.0 07/12/2017   TRIG 377 (H) 08/17/2021   CHOLHDL 4.7 08/17/2021    Significant Diagnostic Results in last 30 days:  No results found.  Assessment/Plan 1. Diarrhea, unspecified type Patient with 2 days of diarrhea, however, symptoms have improved with immodium. Vital signs now stable and patient feels much better. Encouraged hydration given slight bump in Creatinine. Will plan for repeat BMP on 12/05/22. Hold all laxitives until resolution of symptoms.   Creatinine  Date/Time Value Ref Range Status  11/26/2022 12:00 AM 1.8 (A) 0.5 - 1.1 Final   Creatinine, Ser  Date/Time Value Ref Range Status  09/27/2022 02:46 PM 1.34 (H) 0.44 - 1.00 mg/dL Final  ]  2. Paroxysmal atrial fibrillation (HCC) Rate well-controlled. Continue diltiazem 120 mg daily. Apixiban 5mg  BID.  3. Hypertension, unspecified type BP well-controlled without medication at this time. CTM.   4. Hypothyroidism, unspecified type Most recent thyroid level within normal range. Continue Synthroid 50 mcg daily.   TSH  Date/Time Value Ref Range Status  09/23/2022 04:45 AM 2.523 0.350 - 4.500 uIU/mL Final    Comment:    Performed by a 3rd Generation assay with a functional sensitivity of  <=0.01 uIU/mL. Performed at [facility], [address] [place], [place] [address]   08/17/2021 10:42 AM 3.97 0.35 - 5.50 uIU/mL Final  ]  5. Secondary hyperparathyroidism of renal origin (HCC) PTH within normal range. CTM  PTH  Date/Time Value Ref Range Status  08/17/2021 10:42 AM 60 16 - 77 pg/mL Final    Comment:    . Interpretive Guide    Intact PTH           Calcium ------------------    ----------           ------- Normal Parathyroid    Normal               Normal Hypoparathyroidism    Low or Low Normal    Low Hyperparathyroidism    Primary            Normal or High       High    Secondary          High                 Normal or Low    Tertiary           High                 High Non-Parathyroid    Hypercalcemia      Low or Low Normal    High .   ]  6. Closed nondisplaced intertrochanteric fracture of right femur, initial encounter (HCC) Occurred 09/21/2022. Continues rehab in Skilled nursing.   7. CKD (chronic kidney disease), stage IV (HCC) AKI notable. Continues to have adequate urine output and hydration. Follow up BMP on 12/05/22.     Family/ staff Communication: nursing. Plan to call family for update  Labs/tests ordered:  BMP

## 2022-11-30 ENCOUNTER — Encounter: Payer: Self-pay | Admitting: Nurse Practitioner

## 2022-11-30 ENCOUNTER — Non-Acute Institutional Stay (SKILLED_NURSING_FACILITY): Payer: Medicare HMO | Admitting: Nurse Practitioner

## 2022-11-30 DIAGNOSIS — B351 Tinea unguium: Secondary | ICD-10-CM | POA: Diagnosis not present

## 2022-11-30 DIAGNOSIS — M79675 Pain in left toe(s): Secondary | ICD-10-CM | POA: Diagnosis not present

## 2022-11-30 LAB — BASIC METABOLIC PANEL
BUN: 17 (ref 4–21)
CO2: 30 — AB (ref 13–22)
Chloride: 102 (ref 99–108)
Creatinine: 1.4 — AB (ref 0.5–1.1)
Glucose: 90
Potassium: 3.7 mEq/L (ref 3.5–5.1)
Sodium: 139 (ref 137–147)

## 2022-11-30 LAB — COMPREHENSIVE METABOLIC PANEL: Calcium: 9.1 (ref 8.7–10.7)

## 2022-11-30 NOTE — Progress Notes (Signed)
Location:   [facility] Room Number: 932 A Place of Service:  SNF [phone]) Provider:  Sherrie Mustache, NP  Dewayne Shorter, MD  Patient Care Team: Dewayne Shorter, MD as PCP - General (Family Medicine) Kate Sable, MD as PCP - Cardiology (Cardiology) Theodore Demark, RN (Inactive) as Oncology Nurse Navigator Lauree Chandler, NP as Nurse Practitioner (Geriatric Medicine)  Extended Emergency Contact Information Primary Emergency Contact: Heinz,david Address: [address]          [place], [place] [address] Johnnette Litter of [place] Phone: [phone] Mobile Phone: [phone] Relation: Son  Code Status:  DNR Goals of care: Advanced Directive information    11/30/2022    4:08 PM  Advanced Directives  Does Patient Have a Medical Advance Directive? Yes  Type of Advance Directive Living will;Out of facility DNR (pink MOST or yellow form)  Does patient want to make changes to medical advance directive? No - Patient declined  Pre-existing out of facility DNR order (yellow form or pink MOST form) Yellow form placed in chart (order not valid for inpatient use)     Chief Complaint  Patient presents with   Acute Visit    Left great toe pain    HPI:  Pt is a 88 y.o. female seen today for an acute visit for left great toe pain.  Reports pain for 2 days and staff gave her tylenol last night with signficant relief.  Pain minimal today. No swelling or heat noted today   Past Medical History:  Diagnosis Date   Breast cancer ([place])    CKD (chronic kidney disease) stage 3, GFR 30-59 ml/min (HCC)    Displaced intertrochanteric fracture of right femur ([place])    Per [facility] EMR, [employer] Care   Former smoker    Heart disease    mitral valvular   Hyperlipidemia    Hyperparathyroidism, secondary ([place])    Hypertension    Hypo-osmolality and hyponatremia    Per [facility] EMR, [employer] Care   Hypothyroidism    Macular degeneration     Per [employer] EMR, [employer] Care   Sensorineural hearing loss    Spinal stenosis of lumbar region 2006   s/p surgery   UTI (urinary tract infection)    Per [facility] EMR, [employer] Care   Vitamin D deficiency    Per [facility] EMR, [employer] Care   Past Surgical History:  Procedure Laterality Date   ABDOMINAL HYSTERECTOMY     bilateral cataract surgery     BREAST LUMPECTOMY Right 07/22/2021   CHOLECYSTECTOMY  12/04/1978   FRACTURE SURGERY Left 12/29/2018   FEMUR   INTRAMEDULLARY (IM) NAIL INTERTROCHANTERIC Right 09/21/2022   Procedure: INTRAMEDULLARY (IM) NAIL INTERTROCHANTERIC;  Surgeon: Thornton Park, MD;  Location: [facility] ORS;  Service: Orthopedics;  Laterality: Right;   JOINT REPLACEMENT     Bilateral knee   LAPAROSCOPIC SIGMOID COLECTOMY  12/04/2009   secondary to benign mass   MELANOMA EXCISION WITH SENTINEL LYMPH NODE BIOPSY Right 07/22/2021   Procedure: MELANOMA EXCISION WITH SENTINEL LYMPH NODE BIOPSY;  Surgeon: Herbert Pun, MD;  Location: [facility] ORS;  Service: General;  Laterality: Right;   PART [place] NODE BIOPSY Right 07/22/2021   Procedure: PART MASTECTOMY,RADIO FREQUENCY LOCALIZER,AXILLARY SENTINEL NODE BIOPSY;  Surgeon: Herbert Pun, MD;  Location: [facility] ORS;  Service: General;  Laterality: Right;   REPLACEMENT TOTAL KNEE BILATERAL     SMALL INTESTINE SURGERY  12/04/2008  for SBO   SPINE SURGERY  12/04/2004   rods, [facility]   tendon repair     achille heall, left     Allergies  Allergen Reactions   Bactrim [Sulfamethoxazole-Trimethoprim] Nausea And Vomiting    Lethargic and weakness    Allergies as of 11/30/2022       Reactions   Bactrim [sulfamethoxazole-trimethoprim] Nausea And Vomiting   Lethargic and weakness        Medication List        Accurate as of November 30, 2022  4:18 PM. If you have any questions, ask your nurse or doctor.          acetaminophen 325 MG  tablet Commonly known as: TYLENOL Take 650 mg by mouth every 6 (six) hours as needed. What changed: Another medication with the same name was removed. Continue taking this medication, and follow the directions you see here. Changed by: Lauree Chandler, NP   acetaminophen 325 MG tablet Commonly known as: TYLENOL Take 650 mg by mouth every 6 (six) hours as needed. What changed: Another medication with the same name was removed. Continue taking this medication, and follow the directions you see here. Changed by: Lauree Chandler, NP   alum & mag hydroxide-simeth 200-200-20 MG/5ML suspension Commonly known as: MAALOX/MYLANTA Take 30 mLs by mouth every 4 (four) hours as needed for indigestion.   apixaban 5 MG Tabs tablet Commonly known as: Eliquis Take 1 tablet (5 mg total) by mouth 2 (two) times daily.   bisacodyl 10 MG suppository Commonly known as: DULCOLAX Place 1 suppository (10 mg total) rectally daily as needed for moderate constipation.   diltiazem 120 MG 24 hr capsule Commonly known as: Cardizem CD Take 1 capsule (120 mg total) by mouth daily.   levothyroxine 50 MCG tablet Commonly known as: SYNTHROID TAKE 1 TABLET EVERY DAY ON EMPTY STOMACHWITH A GLASS OF WATER AT LEAST 30-60 MINBEFORE BREAKFAST   loperamide 1 MG/5ML solution Commonly known as: IMODIUM Take 30 mLs by mouth every 4 (four) hours as needed for diarrhea or loose stools.   Magnesium Oxide 250 MG Tabs Take 1 tablet by mouth every other day. At bedtime   ondansetron 4 MG tablet Commonly known as: ZOFRAN Take 1 tablet (4 mg total) by mouth every 6 (six) hours as needed for nausea.   polyethylene glycol 17 g packet Commonly known as: MIRALAX / GLYCOLAX Take 17 g by mouth daily as needed for mild constipation.   PRESERVISION AREDS PO Take 1 capsule by mouth 2 (two) times daily.   RA Calcium Plus Vitamin D3 600-10 MG-MCG Tabs Generic drug: Calcium Carb-Cholecalciferol Take 1 tablet by mouth 2 (two)  times daily. What changed: Another medication with the same name was removed. Continue taking this medication, and follow the directions you see here. Changed by: Lauree Chandler, NP        Review of Systems  Constitutional:  Negative for activity change.  Musculoskeletal:  Positive for arthralgias.    Immunization History  Administered Date(s) Administered   Influenza Split 10/03/2014, 10/04/2015   Influenza, High Dose Seasonal PF 10/04/2019, 09/21/2020, 09/20/2022   Influenza-Unspecified 09/03/2013, 10/01/2016, 10/06/2017, 09/18/2018   Moderna Sars-Covid-2 Vaccination 12/22/2019, 01/19/2020, 08/08/2021, 10/13/2022   Pneumococcal Conjugate-13 10/26/2014   Pneumococcal Polysaccharide-23 04/04/1993, 09/20/2008, 01/28/2016   Tdap 02/14/2013   Zoster Recombinat (Shingrix) 02/07/2017, 06/18/2017   Zoster, Live 12/08/2009   Pertinent  Health Maintenance Due  Topic Date Due   INFLUENZA VACCINE  Completed  DEXA SCAN  Completed      09/24/2022    1:00 AM 09/24/2022   11:00 AM 09/25/2022   12:00 AM 09/25/2022    8:00 AM 09/27/2022    2:43 PM  Fall Risk  Patient Fall Risk Level High fall risk High fall risk High fall risk High fall risk High fall risk   Functional Status Survey:    Vitals:   11/30/22 1605  BP: 119/72  Pulse: 80  Resp: 20  Temp: 98 F (36.7 C)  SpO2: 99%  Weight: 167 lb (75.8 kg)  Height: '5\' 5"'$  (1.651 m)   Body mass index is 27.79 kg/m. Physical Exam Feet:     Left foot:     Toenail Condition: Fungal disease present.    Comments: Mild tenderness noted to great toe at DIP and PIP    Labs reviewed: Recent Labs    09/21/22 2149 09/22/22 0314 09/24/22 0526 09/25/22 0304 09/27/22 1446 09/28/22 0000 11/26/22 0000  NA  --    < > 132* 132* 135 135* 134*  K  --    < > 4.2 4.0 3.8 4.2 3.7  CL  --    < > 105 103 101 102 97*  CO2  --    < > '24 25 25 '$ 26* 26*  GLUCOSE  --    < > 112* 95 117*  --   --   BUN  --    < > '16 16 18 20 '$ 24*   CREATININE  --    < > 1.26* 1.28* 1.34* 1.3* 1.8*  CALCIUM  --    < > 7.8* 7.7* 9.2 8.6* 9.4  MG 1.6*  --   --   --  2.0  --   --   PHOS 2.9  --   --   --   --   --   --    < > = values in this interval not displayed.   Recent Labs    09/27/22 1446 11/26/22 0000  AST 40 21  ALT 23 15  ALKPHOS 78 105  BILITOT 2.2*  --   PROT 6.8  --   ALBUMIN 3.3* 3.6   Recent Labs    09/24/22 0526 09/25/22 0304 09/27/22 1446 09/28/22 0000 11/06/22 0000 11/26/22 0000  WBC 8.5 8.6 9.0 7.7 7.2 9.0  NEUTROABS  --   --  6.1 4,851.00  --  6,363.00  HGB 8.1* 7.6* 9.0* 7.9* 11.9* 12.2  HCT 23.9* 22.3* 27.7* 24* 36 37  MCV 92.3 94.1 96.5  --   --   --   PLT 187 198 337 341 334 306   Lab Results  Component Value Date   TSH 2.523 09/23/2022   Lab Results  Component Value Date   HGBA1C 5.8 03/08/2020   Lab Results  Component Value Date   CHOL 213 (H) 08/17/2021   HDL 45 (L) 08/17/2021   LDLCALC 114 (H) 08/17/2021   LDLDIRECT 132.0 07/12/2017   TRIG 377 (H) 08/17/2021   CHOLHDL 4.7 08/17/2021    Significant Diagnostic Results in last 30 days:  No results found.  Assessment/Plan 1. Pain of toe of left foot -will get uric acid level to rule out gout, pain could be due OA  2. Toenail fungus -will have patient placed on podiatry list to have nails maintained.   Carlos American. [place], [facility] [phone]

## 2022-12-01 ENCOUNTER — Telehealth: Payer: Self-pay

## 2022-12-01 NOTE — Telephone Encounter (Signed)
Patient's son, Shanon Brow, called wanting to know if he could get some feedback from patient's most recent visit.  He would specifically like to discuss her GFR and kidney disease. He would like to know if patient's diet may have something to do with that.  Message routed to Dr. Dewayne Shorter

## 2022-12-04 ENCOUNTER — Encounter: Payer: Self-pay | Admitting: Student

## 2022-12-06 ENCOUNTER — Telehealth: Payer: Medicare HMO | Admitting: Student

## 2022-12-06 DIAGNOSIS — R634 Abnormal weight loss: Secondary | ICD-10-CM | POA: Diagnosis not present

## 2022-12-06 DIAGNOSIS — E46 Unspecified protein-calorie malnutrition: Secondary | ICD-10-CM | POA: Diagnosis not present

## 2022-12-06 NOTE — Telephone Encounter (Addendum)
Patient Location  = [facility]  Provider Location = [facility]  Method = telephone    Spoke with patient's son and HCPOA regarding most recent visit, lab findings, and weight loss.   She has lost 20 lbs since living in healthcare facility. She had weight she could lose, but it also seems her appetite has declined. Discussed with him concern that she has lost 10 lbs in the last month. She endorses appetite changes. Her taste has changed as well. Unclear underlying issue -- for now just monitor and track.   Consider work up for secondary underlying causes -- cancer vs other things. She has a history of breast cancer and decided not to do anything. Concern that diagnostic testing is more invasive and son states she will likely decline invasive testing and referrals because she does not want more doctors. She stopped cancer screenings at 71, and only had mammogram because she had a lump.   In the past she has preferred less invasive approach given her age.   A/P Patient had 10 lb weight loss in the month of December. Significant decline in appetite for the last month. Encourage supplementation. Will monitor with weekly weights for the next month. At that time will order CBC, CMP, Urinalysis, CXR, Fecal occult blood to determine potential underlying cause for weight loss. If weight has continued to decline and there is a negative workup, will start mirtazapine 7.5 mg for orexigenic effects. Discussed patient's atrial fibrillation is currently stable and she is on appropriate therapy with AC and rate control.   I spent 25 minutes discussing patient's current status, prognosis, and next steps with her son.  Tomasa Rand, MD, [facility] [phone]

## 2022-12-07 ENCOUNTER — Ambulatory Visit: Payer: Medicare HMO | Admitting: Cardiology

## 2022-12-07 LAB — BASIC METABOLIC PANEL
BUN: 18 (ref 4–21)
CO2: 28 — AB (ref 13–22)
Chloride: 102 (ref 99–108)
Creatinine: 1.8 — AB (ref 0.5–1.1)
Glucose: 115
Potassium: 4.1 mEq/L (ref 3.5–5.1)
Sodium: 138 (ref 137–147)

## 2022-12-07 LAB — COMPREHENSIVE METABOLIC PANEL: Calcium: 9.1 (ref 8.7–10.7)

## 2023-01-02 ENCOUNTER — Non-Acute Institutional Stay (SKILLED_NURSING_FACILITY): Payer: Medicare HMO | Admitting: Nurse Practitioner

## 2023-01-02 ENCOUNTER — Encounter: Payer: Self-pay | Admitting: Nurse Practitioner

## 2023-01-02 DIAGNOSIS — I10 Essential (primary) hypertension: Secondary | ICD-10-CM | POA: Diagnosis not present

## 2023-01-02 DIAGNOSIS — N184 Chronic kidney disease, stage 4 (severe): Secondary | ICD-10-CM

## 2023-01-02 DIAGNOSIS — R634 Abnormal weight loss: Secondary | ICD-10-CM

## 2023-01-02 DIAGNOSIS — E039 Hypothyroidism, unspecified: Secondary | ICD-10-CM

## 2023-01-02 DIAGNOSIS — I480 Paroxysmal atrial fibrillation: Secondary | ICD-10-CM

## 2023-01-02 DIAGNOSIS — E46 Unspecified protein-calorie malnutrition: Secondary | ICD-10-CM

## 2023-01-02 DIAGNOSIS — E559 Vitamin D deficiency, unspecified: Secondary | ICD-10-CM

## 2023-01-02 DIAGNOSIS — D6869 Other thrombophilia: Secondary | ICD-10-CM

## 2023-01-02 NOTE — Progress Notes (Deleted)
Location:   [place] Room Number: 409 A Place of Service:  SNF [phone]) Provider:  Sherrie Mustache, NP  Dewayne Shorter, MD  Patient Care Team: Dewayne Shorter, MD as PCP - General (Family Medicine) Kate Sable, MD as PCP - Cardiology (Cardiology) Theodore Demark, RN (Inactive) as Oncology Nurse Navigator Lauree Chandler, NP as Nurse Practitioner (Geriatric Medicine)  Extended Emergency Contact Information Primary Emergency Contact: Liguori,david Address: [address]          [place], [place] [address] Johnnette Litter of [place] Phone: [phone] Mobile Phone: [phone] Relation: Son  Code Status:  DNR Goals of care: Advanced Directive information    01/02/2023    3:59 PM  Advanced Directives  Does Patient Have a Medical Advance Directive? Yes  Type of Advance Directive Living will;Out of facility DNR (pink MOST or yellow form)  Does patient want to make changes to medical advance directive? No - Patient declined  Pre-existing out of facility DNR order (yellow form or pink MOST form) Yellow form placed in chart (order not valid for inpatient use)     Chief Complaint  Patient presents with   Medical Management of Chronic Issues    Routine follow up   Immunizations    Current COVID vaccine du   Quality Metric Gaps    Medicare annual wellness due    HPI:  Pt is a 89 y.o. female seen today for medical management of chronic diseases.     Past Medical History:  Diagnosis Date   Breast cancer ([place])    CKD (chronic kidney disease) stage 3, GFR 30-59 ml/min (HCC)    Displaced intertrochanteric fracture of right femur ([place])    Per [facility] EMR, [facility]   Former smoker    Heart disease    mitral valvular   Hyperlipidemia    Hyperparathyroidism, secondary ([place])    Hypertension    Hypo-osmolality and hyponatremia    Per [facility] EMR, [facility] [employer] [facility]   Hypothyroidism    Macular degeneration    Per [employer]  EMR, [facility]   Sensorineural hearing loss    Spinal stenosis of lumbar region 2006   s/p surgery   UTI (urinary tract infection)    Per [facility] EMR, [facility]   Vitamin D deficiency    Per [facility] EMR, [facility]   Past Surgical History:  Procedure Laterality Date   ABDOMINAL HYSTERECTOMY     bilateral cataract surgery     BREAST LUMPECTOMY Right 07/22/2021   CHOLECYSTECTOMY  12/04/1978   FRACTURE SURGERY Left 12/29/2018   FEMUR   INTRAMEDULLARY (IM) NAIL INTERTROCHANTERIC Right 09/21/2022   Procedure: INTRAMEDULLARY (IM) NAIL INTERTROCHANTERIC;  Surgeon: Thornton Park, MD;  Location: [facility] ORS;  Service: Orthopedics;  Laterality: Right;   JOINT REPLACEMENT     Bilateral knee   LAPAROSCOPIC SIGMOID COLECTOMY  12/04/2009   secondary to benign mass   MELANOMA EXCISION WITH SENTINEL LYMPH NODE BIOPSY Right 07/22/2021   Procedure: MELANOMA EXCISION WITH SENTINEL LYMPH NODE BIOPSY;  Surgeon: Herbert Pun, MD;  Location: [facility] ORS;  Service: General;  Laterality: Right;   PART [place] NODE BIOPSY Right 07/22/2021   Procedure: PART MASTECTOMY,RADIO FREQUENCY LOCALIZER,AXILLARY SENTINEL NODE BIOPSY;  Surgeon: Herbert Pun, MD;  Location: [facility] ORS;  Service: General;  Laterality: Right;   REPLACEMENT TOTAL KNEE BILATERAL     SMALL INTESTINE SURGERY  12/04/2008   for SBO  SPINE SURGERY  12/04/2004   [facility], [facility]   tendon repair     achille heall, left     Allergies  Allergen Reactions   Bactrim [Sulfamethoxazole-Trimethoprim] Nausea And Vomiting    Lethargic and weakness    Allergies as of 01/02/2023       Reactions   Bactrim [sulfamethoxazole-trimethoprim] Nausea And Vomiting   Lethargic and weakness        Medication List        Accurate as of January 02, 2023  4:05 PM. If you have any questions, ask your nurse or doctor.          acetaminophen 325 MG tablet Commonly  known as: TYLENOL Take 650 mg by mouth every 6 (six) hours as needed.   alum & mag hydroxide-simeth 200-200-20 MG/5ML suspension Commonly known as: MAALOX/MYLANTA Take 30 mLs by mouth every 4 (four) hours as needed for indigestion.   apixaban 5 MG Tabs tablet Commonly known as: Eliquis Take 1 tablet (5 mg total) by mouth 2 (two) times daily.   bisacodyl 10 MG suppository Commonly known as: DULCOLAX Place 1 suppository (10 mg total) rectally daily as needed for moderate constipation.   diltiazem 120 MG 24 hr capsule Commonly known as: Cardizem CD Take 1 capsule (120 mg total) by mouth daily.   levothyroxine 50 MCG tablet Commonly known as: SYNTHROID TAKE 1 TABLET EVERY DAY ON EMPTY STOMACHWITH A GLASS OF WATER AT LEAST 30-60 MINBEFORE BREAKFAST   loperamide 1 MG/5ML solution Commonly known as: IMODIUM Take 30 mLs by mouth every 4 (four) hours as needed for diarrhea or loose stools.   Magnesium Oxide 250 MG Tabs Take 1 tablet by mouth every other day. At bedtime   ondansetron 4 MG tablet Commonly known as: ZOFRAN Take 1 tablet (4 mg total) by mouth every 6 (six) hours as needed for nausea.   polyethylene glycol 17 g packet Commonly known as: MIRALAX / GLYCOLAX Take 17 g by mouth daily as needed for mild constipation.   PRESERVISION AREDS PO Take 1 capsule by mouth 2 (two) times daily.   RA Calcium Plus Vitamin D3 600-10 MG-MCG Tabs Generic drug: Calcium Carb-Cholecalciferol Take 1 tablet by mouth 2 (two) times daily.        Review of Systems  Immunization History  Administered Date(s) Administered   Influenza Split 10/03/2014, 10/04/2015   Influenza, High Dose Seasonal PF 10/04/2019, 09/21/2020, 09/20/2022   Influenza-Unspecified 09/03/2013, 10/01/2016, 10/06/2017, 09/18/2018   Moderna Sars-Covid-2 Vaccination 12/22/2019, 01/19/2020, 08/08/2021, 10/13/2022   Pneumococcal Conjugate-13 10/26/2014   Pneumococcal Polysaccharide-23 04/04/1993, 09/20/2008, 01/28/2016    Tdap 02/14/2013   Zoster Recombinat (Shingrix) 02/07/2017, 06/18/2017   Zoster, Live 12/08/2009   Pertinent  Health Maintenance Due  Topic Date Due   INFLUENZA VACCINE  Completed   DEXA SCAN  Completed      09/24/2022    1:00 AM 09/24/2022   11:00 AM 09/25/2022   12:00 AM 09/25/2022    8:00 AM 09/27/2022    2:43 PM  Fall Risk  (RETIRED) Patient Fall Risk Level High fall risk High fall risk High fall risk High fall risk High fall risk   Functional Status Survey:    Vitals:   01/02/23 1554  BP: 119/72  Pulse: 80  Resp: 20  Temp: 98 F (36.7 C)  SpO2: 99%  Weight: 159 lb 12.8 oz (72.5 kg)  Height: '5\' 5"'$  (1.651 m)   Body mass index is 26.59 kg/m. Physical Exam  Labs reviewed: Recent Labs  09/21/22 2149 09/22/22 0314 09/24/22 0526 09/25/22 0304 09/27/22 1446 09/28/22 0000 11/26/22 0000  NA  --    < > 132* 132* 135 135* 134*  K  --    < > 4.2 4.0 3.8 4.2 3.7  CL  --    < > 105 103 101 102 97*  CO2  --    < > '24 25 25 '$ 26* 26*  GLUCOSE  --    < > 112* 95 117*  --   --   BUN  --    < > '16 16 18 20 '$ 24*  CREATININE  --    < > 1.26* 1.28* 1.34* 1.3* 1.8*  CALCIUM  --    < > 7.8* 7.7* 9.2 8.6* 9.4  MG 1.6*  --   --   --  2.0  --   --   PHOS 2.9  --   --   --   --   --   --    < > = values in this interval not displayed.   Recent Labs    09/27/22 1446 11/26/22 0000  AST 40 21  ALT 23 15  ALKPHOS 78 105  BILITOT 2.2*  --   PROT 6.8  --   ALBUMIN 3.3* 3.6   Recent Labs    09/24/22 0526 09/25/22 0304 09/27/22 1446 09/28/22 0000 11/06/22 0000 11/26/22 0000  WBC 8.5 8.6 9.0 7.7 7.2 9.0  NEUTROABS  --   --  6.1 4,851.00  --  6,363.00  HGB 8.1* 7.6* 9.0* 7.9* 11.9* 12.2  HCT 23.9* 22.3* 27.7* 24* 36 37  MCV 92.3 94.1 96.5  --   --   --   PLT 187 198 337 341 334 306   Lab Results  Component Value Date   TSH 2.523 09/23/2022   Lab Results  Component Value Date   HGBA1C 5.8 03/08/2020   Lab Results  Component Value Date   CHOL 213 (H)  08/17/2021   HDL 45 (L) 08/17/2021   LDLCALC 114 (H) 08/17/2021   LDLDIRECT 132.0 07/12/2017   TRIG 377 (H) 08/17/2021   CHOLHDL 4.7 08/17/2021    Significant Diagnostic Results in last 30 days:  No results found.  Assessment/Plan 1. Weight loss, unintentional ***  2. Protein deficiency (HCC) ***  3. Hypertension, unspecified type ***  4. Hypothyroidism, unspecified type ***  5. CKD (chronic kidney disease), stage IV (HCC) ***  6. Vitamin D deficiency ***    Family/ staff Communication:   Labs/tests ordered:

## 2023-01-02 NOTE — Addendum Note (Signed)
Addended by: Dewayne Shorter on: 01/02/2023 07:04 PM   Modules accepted: Level of Service

## 2023-01-02 NOTE — Progress Notes (Signed)
Location:  [facility] Room Number: 782 A Place of Service:  SNF (31)  Dewayne Shorter, MD  Patient Care Team: Dewayne Shorter, MD as PCP - General (Family Medicine) Kate Sable, MD as PCP - Cardiology (Cardiology) Theodore Demark, RN (Inactive) as Oncology Nurse Navigator Lauree Chandler, NP as Nurse Practitioner (Geriatric Medicine)  Extended Emergency Contact Information Primary Emergency Contact: Roycroft,david Address: [address]          [place], [place] [address] Johnnette Litter of [place] Phone: [phone] Mobile Phone: [phone] Relation: Son  Goals of care: Advanced Directive information    01/02/2023    3:59 PM  Advanced Directives  Does Patient Have a Medical Advance Directive? Yes  Type of Advance Directive Living will;Out of facility DNR (pink MOST or yellow form)  Does patient want to make changes to medical advance directive? No - Patient declined  Pre-existing out of facility DNR order (yellow form or pink MOST form) Yellow form placed in chart (order not valid for inpatient use)     Chief Complaint  Patient presents with   Medical Management of Chronic Issues    Routine follow up   Immunizations    Current COVID vaccine du   Quality Metric Gaps    Medicare annual wellness due    HPI:  Pt is a 89 y.o. female seen today for medical management of chronic disease.   She has had ongoing weight loss- reports she feels like she is eating. Not having any pain or trouble with swallowing or chewing.   Reports she feels well. She drinks a supplement daily.   No wounds or issues with her skin  Reports some pain in right hip with walking and after she sits down but only momentary and does not require medication.   Reports she takes her thyroid medication early in the morning- gets woken up for this.   States she is likely to be constipated but since she has been at [facility] bowels are moving well.  Feels like she is eating more  fruit.   Past Medical History:  Diagnosis Date   Breast cancer ([place])    CKD (chronic kidney disease) stage 3, GFR 30-59 ml/min (HCC)    Displaced intertrochanteric fracture of right femur ([place])    Per [facility] EMR, [facility]   Former smoker    Heart disease    mitral valvular   Hyperlipidemia    Hyperparathyroidism, secondary ([place])    Hypertension    Hypo-osmolality and hyponatremia    Per [facility] EMR, [facility]   Hypothyroidism    Macular degeneration    Per [employer] EMR, [facility] [employer] [facility]   Sensorineural hearing loss    Spinal stenosis of lumbar region 2006   s/p surgery   UTI (urinary tract infection)    Per [facility] EMR, [facility]   Vitamin D deficiency    Per [facility] EMR, [facility]   Past Surgical History:  Procedure Laterality Date   ABDOMINAL HYSTERECTOMY     bilateral cataract surgery     BREAST LUMPECTOMY Right 07/22/2021   CHOLECYSTECTOMY  12/04/1978   FRACTURE SURGERY Left 12/29/2018   FEMUR   INTRAMEDULLARY (IM) NAIL INTERTROCHANTERIC Right 09/21/2022   Procedure: INTRAMEDULLARY (IM) NAIL INTERTROCHANTERIC;  Surgeon: Thornton Park, MD;  Location: [facility] ORS;  Service: Orthopedics;  Laterality: Right;   JOINT REPLACEMENT     Bilateral knee   LAPAROSCOPIC SIGMOID COLECTOMY  12/04/2009  secondary to benign mass   MELANOMA EXCISION WITH SENTINEL LYMPH NODE BIOPSY Right 07/22/2021   Procedure: MELANOMA EXCISION WITH SENTINEL LYMPH NODE BIOPSY;  Surgeon: Herbert Pun, MD;  Location: [facility] ORS;  Service: General;  Laterality: Right;   PART [place] NODE BIOPSY Right 07/22/2021   Procedure: PART MASTECTOMY,RADIO FREQUENCY LOCALIZER,AXILLARY SENTINEL NODE BIOPSY;  Surgeon: Herbert Pun, MD;  Location: [facility] ORS;  Service: General;  Laterality: Right;   REPLACEMENT TOTAL KNEE BILATERAL     SMALL INTESTINE SURGERY  12/04/2008   for SBO   SPINE SURGERY  12/04/2004    ro[facility], [facility]   tendon repair     achille heall, left     Allergies  Allergen Reactions   Bactrim [Sulfamethoxazole-Trimethoprim] Nausea And Vomiting    Lethargic and weakness    Outpatient Encounter Medications as of 01/02/2023  Medication Sig   acetaminophen (TYLENOL) 325 MG tablet Take 650 mg by mouth every 6 (six) hours as needed.   alum & mag hydroxide-simeth (MAALOX/MYLANTA) 200-200-20 MG/5ML suspension Take 30 mLs by mouth every 4 (four) hours as needed for indigestion.   apixaban (ELIQUIS) 5 MG TABS tablet Take 1 tablet (5 mg total) by mouth 2 (two) times daily.   bisacodyl (DULCOLAX) 10 MG suppository Place 1 suppository (10 mg total) rectally daily as needed for moderate constipation.   Calcium Carb-Cholecalciferol (RA CALCIUM PLUS VITAMIN D3) 600-10 MG-MCG TABS Take 1 tablet by mouth 2 (two) times daily.   diltiazem (CARDIZEM CD) 120 MG 24 hr capsule Take 1 capsule (120 mg total) by mouth daily.   levothyroxine (SYNTHROID) 50 MCG tablet TAKE 1 TABLET EVERY DAY ON EMPTY STOMACHWITH A GLASS OF WATER AT LEAST 30-60 MINBEFORE BREAKFAST   loperamide (IMODIUM) 1 MG/5ML solution Take 30 mLs by mouth every 4 (four) hours as needed for diarrhea or loose stools.   Magnesium Oxide 250 MG TABS Take 1 tablet by mouth every other day. At bedtime   Multiple Vitamins-Minerals (PRESERVISION AREDS PO) Take 1 capsule by mouth 2 (two) times daily.   ondansetron (ZOFRAN) 4 MG tablet Take 1 tablet (4 mg total) by mouth every 6 (six) hours as needed for nausea.   polyethylene glycol (MIRALAX / GLYCOLAX) 17 g packet Take 17 g by mouth daily as needed for mild constipation.   [DISCONTINUED] acetaminophen (TYLENOL) 325 MG tablet Take 650 mg by mouth every 6 (six) hours as needed.   No facility-administered encounter medications on file as of 01/02/2023.    Review of Systems  Constitutional:  Positive for unexpected weight change. Negative for activity change, appetite change and fatigue.  HENT:   Negative for congestion and hearing loss.   Eyes: Negative.   Respiratory:  Negative for cough and shortness of breath.   Cardiovascular:  Negative for chest pain, palpitations and leg swelling.  Gastrointestinal:  Negative for abdominal pain, constipation and diarrhea.  Genitourinary:  Negative for difficulty urinating and dysuria.  Musculoskeletal:  Negative for arthralgias and myalgias.  Skin:  Negative for color change and wound.  Neurological:  Negative for dizziness and weakness.  Psychiatric/Behavioral:  Negative for agitation, behavioral problems and confusion.      Immunization History  Administered Date(s) Administered   Influenza Split 10/03/2014, 10/04/2015   Influenza, High Dose Seasonal PF 10/04/2019, 09/21/2020, 09/20/2022   Influenza-Unspecified 09/03/2013, 10/01/2016, 10/06/2017, 09/18/2018   Moderna Sars-Covid-2 Vaccination 12/22/2019, 01/19/2020, 08/08/2021, 10/13/2022   Pneumococcal Conjugate-13 10/26/2014   Pneumococcal Polysaccharide-23 04/04/1993, 09/20/2008, 01/28/2016   Tdap 02/14/2013  Zoster Recombinat (Shingrix) 02/07/2017, 06/18/2017   Zoster, Live 12/08/2009   Pertinent  Health Maintenance Due  Topic Date Due   INFLUENZA VACCINE  Completed   DEXA SCAN  Completed      09/24/2022    1:00 AM 09/24/2022   11:00 AM 09/25/2022   12:00 AM 09/25/2022    8:00 AM 09/27/2022    2:43 PM  Fall Risk  (RETIRED) Patient Fall Risk Level High fall risk High fall risk High fall risk High fall risk High fall risk   Functional Status Survey:    Vitals:   01/02/23 1554  BP: 119/72  Pulse: 80  Resp: 20  Temp: 98 F (36.7 C)  SpO2: 99%  Weight: 159 lb 12.8 oz (72.5 kg)  Height: '5\' 5"'$  (1.651 m)   Body mass index is 26.59 kg/m. Physical Exam Constitutional:      General: She is not in acute distress.    Appearance: She is well-developed. She is not diaphoretic.  HENT:     Head: Normocephalic and atraumatic.     Mouth/Throat:     Pharynx: No  oropharyngeal exudate.  Eyes:     Conjunctiva/sclera: Conjunctivae normal.     Pupils: Pupils are equal, round, and reactive to light.  Cardiovascular:     Rate and Rhythm: Normal rate and regular rhythm.     Heart sounds: Normal heart sounds.  Pulmonary:     Effort: Pulmonary effort is normal.     Breath sounds: Normal breath sounds.  Abdominal:     General: Bowel sounds are normal.     Palpations: Abdomen is soft.  Musculoskeletal:     Cervical back: Normal range of motion and neck supple.     Right lower leg: No edema.     Left lower leg: No edema.  Skin:    General: Skin is warm and dry.  Neurological:     Mental Status: She is alert.  Psychiatric:        Mood and Affect: Mood normal.    Labs reviewed: Recent Labs    09/21/22 2149 09/22/22 0314 09/24/22 0526 09/25/22 0304 09/27/22 1446 09/28/22 0000 11/26/22 0000 11/30/22 0000 12/07/22 0000  NA  --    < > 132* 132* 135   < > 134* 139 138  K  --    < > 4.2 4.0 3.8   < > 3.7 3.7 4.1  CL  --    < > 105 103 101   < > 97* 102 102  CO2  --    < > '24 25 25   '$ < > 26* 30* 28*  GLUCOSE  --    < > 112* 95 117*  --   --   --   --   BUN  --    < > '16 16 18   '$ < > 24* 17 18  CREATININE  --    < > 1.26* 1.28* 1.34*   < > 1.8* 1.4* 1.8*  CALCIUM  --    < > 7.8* 7.7* 9.2   < > 9.4 9.1 9.1  MG 1.6*  --   --   --  2.0  --   --   --   --   PHOS 2.9  --   --   --   --   --   --   --   --    < > = values in this interval not displayed.   Recent Labs  09/27/22 1446 11/26/22 0000  AST 40 21  ALT 23 15  ALKPHOS 78 105  BILITOT 2.2*  --   PROT 6.8  --   ALBUMIN 3.3* 3.6   Recent Labs    09/24/22 0526 09/25/22 0304 09/27/22 1446 09/28/22 0000 11/06/22 0000 11/26/22 0000  WBC 8.5 8.6 9.0 7.7 7.2 9.0  NEUTROABS  --   --  6.1 4,851.00  --  6,363.00  HGB 8.1* 7.6* 9.0* 7.9* 11.9* 12.2  HCT 23.9* 22.3* 27.7* 24* 36 37  MCV 92.3 94.1 96.5  --   --   --   PLT 187 198 337 341 334 306   Lab Results  Component Value Date    TSH 2.523 09/23/2022   Lab Results  Component Value Date   HGBA1C 5.8 03/08/2020   Lab Results  Component Value Date   CHOL 213 (H) 08/17/2021   HDL 45 (L) 08/17/2021   LDLCALC 114 (H) 08/17/2021   LDLDIRECT 132.0 07/12/2017   TRIG 377 (H) 08/17/2021   CHOLHDL 4.7 08/17/2021    Significant Diagnostic Results in last 30 days:  No results found.  Assessment/Plan 1. Weight loss, unintentional Feels like she is eating enough but reports she does not eat breakfast anymore.  -states she feels well, she is on a protein supplement.  -wants to wait before starting an appetite stimulant states she is feeling very good.  -will follow up labs at this time and monitor weight   2. Protein deficiency ([place]) -will update CMP, continues on supplement  3. Hypertension, unspecified type -Blood pressure well controlled, goal bp <140/90 Continue current medications and dietary modifications follow metabolic panel  4. Hypothyroidism, unspecified type -continues on synthroid, will follow up TSH  5. CKD (chronic kidney disease), stage IV (HCC) -Chronic and stable Encourage proper hydration Follow metabolic panel Avoid nephrotoxic meds (NSAIDS)  6. Vitamin D deficiency On supplement.   7. Paroxysmal atrial fibrillation (HCC) Rate controlled on cardizem  8. Acquired thrombophilia ([place]) -due to a fib, on eliquis, due to age, and Cr >1.5 will decrease eliquis to 2.5 mg by mouth twice daily    Mirinda Monte K. [place], [facility] [phone]

## 2023-01-04 LAB — TSH: TSH: 3.52 (ref 0.41–5.90)

## 2023-01-04 LAB — HEPATIC FUNCTION PANEL
ALT: 11 U/L (ref 7–35)
AST: 17 (ref 13–35)
Alkaline Phosphatase: 76 (ref 25–125)
Bilirubin, Total: 0.5

## 2023-01-04 LAB — CBC AND DIFFERENTIAL
HCT: 32 — AB (ref 36–46)
Hemoglobin: 10.7 — AB (ref 12.0–16.0)
Neutrophils Absolute: 2904
Platelets: 271 10*3/uL (ref 150–400)
WBC: 6

## 2023-01-04 LAB — BASIC METABOLIC PANEL
BUN: 17 (ref 4–21)
CO2: 28 — AB (ref 13–22)
Chloride: 103 (ref 99–108)
Creatinine: 1.2 — AB (ref 0.5–1.1)
Glucose: 79
Potassium: 4 mEq/L (ref 3.5–5.1)
Sodium: 138 (ref 137–147)

## 2023-01-04 LAB — COMPREHENSIVE METABOLIC PANEL
Albumin: 3.3 — AB (ref 3.5–5.0)
Calcium: 9.2 (ref 8.7–10.7)
Globulin: 2.7
eGFR: 42

## 2023-01-04 LAB — CBC: RBC: 3.45 — AB (ref 3.87–5.11)

## 2023-01-12 ENCOUNTER — Ambulatory Visit: Payer: Medicare HMO | Admitting: Oncology

## 2023-03-07 ENCOUNTER — Encounter: Payer: Self-pay | Admitting: Student

## 2023-03-07 ENCOUNTER — Non-Acute Institutional Stay (SKILLED_NURSING_FACILITY): Payer: Medicare HMO | Admitting: Student

## 2023-03-07 DIAGNOSIS — N2581 Secondary hyperparathyroidism of renal origin: Secondary | ICD-10-CM | POA: Diagnosis not present

## 2023-03-07 DIAGNOSIS — H3530 Unspecified macular degeneration: Secondary | ICD-10-CM

## 2023-03-07 DIAGNOSIS — S72144A Nondisplaced intertrochanteric fracture of right femur, initial encounter for closed fracture: Secondary | ICD-10-CM

## 2023-03-07 DIAGNOSIS — N184 Chronic kidney disease, stage 4 (severe): Secondary | ICD-10-CM

## 2023-03-07 DIAGNOSIS — I10 Essential (primary) hypertension: Secondary | ICD-10-CM

## 2023-03-07 DIAGNOSIS — I480 Paroxysmal atrial fibrillation: Secondary | ICD-10-CM

## 2023-03-07 DIAGNOSIS — F03B0 Unspecified dementia, moderate, without behavioral disturbance, psychotic disturbance, mood disturbance, and anxiety: Secondary | ICD-10-CM

## 2023-03-07 NOTE — Progress Notes (Signed)
Location:  Other [place].  Nursing Home Room Number: [place] of Service:  SNF [phone]) Provider:  Dewayne Shorter, MD  Patient Care Team: Dewayne Shorter, MD as PCP - General (Family Medicine) Kate Sable, MD as PCP - Cardiology (Cardiology) Theodore Demark, RN (Inactive) as Oncology Nurse Navigator Lauree Chandler, NP as Nurse Practitioner (Geriatric Medicine)  Extended Emergency Contact Information Primary Emergency Contact: Sailors,david Address: [address]          [place], [place] [address] Johnnette Litter of [place] Phone: [phone] Mobile Phone: [phone] Relation: Son  Code Status:  DNR Goals of care: Advanced Directive information    03/07/2023    9:47 AM  Advanced Directives  Does Patient Have a Medical Advance Directive? Yes  Type of Paramedic of [place];Living will;Out of facility DNR (pink MOST or yellow form)  Does patient want to make changes to medical advance directive? No - Patient declined  Copy of [place] in Chart? No - copy requested     Chief Complaint  Patient presents with   Medical Management of Chronic Issues    Medical Management of Chronic Issues.     HPI:  Pt is a 89 y.o. female seen today for medical management of chronic diseases.     Past Medical History:  Diagnosis Date   Breast cancer    CKD (chronic kidney disease) stage 3, GFR 30-59 ml/min    Displaced intertrochanteric fracture of right femur    Per [facility], [facility] [employer] [facility]   Former smoker    Heart disease    mitral valvular   Hyperlipidemia    Hyperparathyroidism, secondary    Hypertension    Hypo-osmolality and hyponatremia    Per [employer] [facility] EMR, [employer] [facility]   Hypothyroidism    Macular degeneration    Per [employer] EMR, [facility]   Sensorineural hearing loss    Spinal stenosis of lumbar region 2006   s/p surgery   UTI (urinary tract infection)    Per [facility] EMR, [employer] [facility]   Vitamin D deficiency    Per [facility] EMR, [employer] [facility]   Past Surgical History:  Procedure Laterality Date   ABDOMINAL HYSTERECTOMY     bilateral cataract surgery     BREAST LUMPECTOMY Right 07/22/2021   CHOLECYSTECTOMY  12/04/1978   FRACTURE SURGERY Left 12/29/2018   FEMUR   INTRAMEDULLARY (IM) NAIL INTERTROCHANTERIC Right 09/21/2022   Procedure: INTRAMEDULLARY (IM) NAIL INTERTROCHANTERIC;  Surgeon: Thornton Park, MD;  Location: [facility] ORS;  Service: Orthopedics;  Laterality: Right;   JOINT REPLACEMENT     Bilateral knee   LAPAROSCOPIC SIGMOID COLECTOMY  12/04/2009   secondary to benign mass   MELANOMA EXCISION WITH SENTINEL LYMPH NODE BIOPSY Right 07/22/2021   Procedure: MELANOMA EXCISION WITH SENTINEL LYMPH NODE BIOPSY;  Surgeon: Herbert Pun, MD;  Location: [facility] ORS;  Service: General;  Laterality: Right;   PART [employer] NODE BIOPSY Right 07/22/2021   Procedure: PART MASTECTOMY,RADIO FREQUENCY LOCALIZER,AXILLARY SENTINEL NODE BIOPSY;  Surgeon: Herbert Pun, MD;  Location: [facility] ORS;  Service: General;  Laterality: Right;   REPLACEMENT TOTAL KNEE BILATERAL     SMALL INTESTINE SURGERY  12/04/2008   for SBO   SPINE SURGERY  12/04/2004   [facility], [facility]   tendon repair     achille heall, left     Allergies  Allergen Reactions   Bactrim [Sulfamethoxazole-Trimethoprim] Nausea  And Vomiting    Lethargic and weakness    Outpatient Encounter Medications as of 03/07/2023  Medication Sig   acetaminophen (TYLENOL) 325 MG tablet Take 650 mg by mouth every 6 (six) hours as needed. Give One tablet by mouth every 6 hours as needed.   alum & mag hydroxide-simeth (MAALOX/MYLANTA) 200-200-20 MG/5ML suspension Take 30 mLs by mouth every 4 (four) hours as needed for indigestion.   apixaban (ELIQUIS) 2.5 MG TABS tablet Take 2.5 mg by mouth 2 (two) times daily.   bisacodyl (DULCOLAX) 10 MG suppository Place 1  suppository (10 mg total) rectally daily as needed for moderate constipation.   Calcium Carb-Cholecalciferol (RA CALCIUM PLUS VITAMIN D3) 600-10 MG-MCG TABS Take 1 tablet by mouth 2 (two) times daily.   diltiazem (CARDIZEM LA) 120 MG 24 hr tablet Take 120 mg by mouth daily.   levothyroxine (SYNTHROID) 50 MCG tablet TAKE 1 TABLET EVERY DAY ON EMPTY STOMACHWITH A GLASS OF WATER AT LEAST 30-60 MINBEFORE BREAKFAST   loperamide (IMODIUM) 1 MG/5ML solution Take 30 mLs by mouth every 4 (four) hours as needed for diarrhea or loose stools.   Magnesium Oxide 250 MG TABS Take 1 tablet by mouth every other day. At bedtime   Multiple Vitamins-Minerals (PRESERVISION AREDS PO) Take 1 capsule by mouth 2 (two) times daily.   ondansetron (ZOFRAN) 4 MG tablet Take 1 tablet (4 mg total) by mouth every 6 (six) hours as needed for nausea.   polyethylene glycol (MIRALAX / GLYCOLAX) 17 g packet Take 17 g by mouth daily as needed for mild constipation.   [DISCONTINUED] apixaban (ELIQUIS) 5 MG TABS tablet Take 1 tablet (5 mg total) by mouth 2 (two) times daily.   [DISCONTINUED] diltiazem (CARDIZEM CD) 120 MG 24 hr capsule Take 1 capsule (120 mg total) by mouth daily.   No facility-administered encounter medications on file as of 03/07/2023.    Review of Systems  Immunization History  Administered Date(s) Administered   Influenza Split 10/03/2014, 10/04/2015   Influenza, High Dose Seasonal PF 10/04/2019, 09/21/2020, 09/20/2022   Influenza-Unspecified 09/03/2013, 10/01/2016, 10/06/2017, 09/18/2018   Moderna Sars-Covid-2 Vaccination 12/22/2019, 01/19/2020, 08/08/2021, 10/13/2022   Pneumococcal Conjugate-13 10/26/2014   Pneumococcal Polysaccharide-23 04/04/1993, 09/20/2008, 01/28/2016   Tdap 02/14/2013   Zoster Recombinat (Shingrix) 02/07/2017, 06/18/2017   Zoster, Live 12/08/2009   Pertinent  Health Maintenance Due  Topic Date Due   INFLUENZA VACCINE  07/05/2023   DEXA SCAN  Completed      09/24/2022   11:00 AM  09/25/2022   12:00 AM 09/25/2022    8:00 AM 09/27/2022    2:43 PM 03/09/2023    8:40 PM  Fall Risk  Falls in the past year?     1  Was there an injury with Fall?     1  Fall Risk Category Calculator     3  (RETIRED) Patient Fall Risk Level High fall risk High fall risk High fall risk High fall risk   Patient at Risk for Falls Due to     History of fall(s);Impaired balance/gait;Impaired mobility;Mental status change;Impaired vision  Fall risk Follow up     Falls evaluation completed   Functional Status Survey: Is the patient deaf or have difficulty hearing?: Yes Does the patient have difficulty seeing, even when wearing glasses/contacts?: Yes Does the patient have difficulty concentrating, remembering, or making decisions?: Yes Does the patient have difficulty walking or climbing stairs?: Yes Does the patient have difficulty dressing or bathing?: Yes Does the patient have difficulty doing  errands alone such as visiting a doctor's office or shopping?: Yes  Vitals:   03/07/23 0939  BP: 111/73  Pulse: 86  Resp: 20  Temp: 98 F (36.7 C)  SpO2: 94%  Weight: 175 lb (79.4 kg)  Height: 5\' 5"  (1.651 m)   Body mass index is 29.12 kg/m. Physical Exam  Labs reviewed: Recent Labs    09/21/22 2149 09/22/22 0314 09/24/22 0526 09/25/22 0304 09/27/22 1446 09/28/22 0000 11/30/22 0000 12/07/22 0000 01/04/23 0000  NA  --    < > 132* 132* 135   < > 139 138 138  K  --    < > 4.2 4.0 3.8   < > 3.7 4.1 4.0  CL  --    < > 105 103 101   < > 102 102 103  CO2  --    < > 24 25 25    < > 30* 28* 28*  GLUCOSE  --    < > 112* 95 117*  --   --   --   --   BUN  --    < > 16 16 18    < > 17 18 17   CREATININE  --    < > 1.26* 1.28* 1.34*   < > 1.4* 1.8* 1.2*  CALCIUM  --    < > 7.8* 7.7* 9.2   < > 9.1 9.1 9.2  MG 1.6*  --   --   --  2.0  --   --   --   --   PHOS 2.9  --   --   --   --   --   --   --   --    < > = values in this interval not displayed.   Recent Labs    09/27/22 1446  11/26/22 0000 01/04/23 0000  AST 40 21 17  ALT 23 15 11   ALKPHOS 78 105 76  BILITOT 2.2*  --   --   PROT 6.8  --   --   ALBUMIN 3.3* 3.6 3.3*   Recent Labs    09/24/22 0526 09/25/22 0304 09/27/22 1446 09/27/22 1446 09/28/22 0000 11/06/22 0000 11/26/22 0000 01/04/23 0000  WBC 8.5 8.6 9.0   < > 7.7 7.2 9.0 6.0  NEUTROABS  --   --  6.1  --  4,851.00  --  6,363.00 2,904.00  HGB 8.1* 7.6* 9.0*  --  7.9* 11.9* 12.2 10.7*  HCT 23.9* 22.3* 27.7*  --  24* 36 37 32*  MCV 92.3 94.1 96.5  --   --   --   --   --   PLT 187 198 337  --  341 334 306 271   < > = values in this interval not displayed.   Lab Results  Component Value Date   TSH 3.52 01/04/2023   Lab Results  Component Value Date   HGBA1C 5.8 03/08/2020   Lab Results  Component Value Date   CHOL 213 (H) 08/17/2021   HDL 45 (L) 08/17/2021   LDLCALC 114 (H) 08/17/2021   LDLDIRECT 132.0 07/12/2017   TRIG 377 (H) 08/17/2021   CHOLHDL 4.7 08/17/2021    Significant Diagnostic Results in last 30 days:  No results found. FRAIL-NH Score Fatigue: 0 Resistance: 1 Ambulation : 1 Incontinence : 1 Loss of Weight: 1 Nutritional Approach : 0 Help with Dressing: 0  Total: 4 (03/09/2023  8:41 PM)    Assessment/Plan 1. Secondary hyperparathyroidism of renal origin Patient  with CKD IV. Plan to check PTH with next set of routine labs. Also check vitamin D levels.   2. Paroxysmal atrial fibrillation Rate well-controlled on current regimen. No symptoms of dizziness, etc. Continue apixiban 2.5 mg BID. Continue cardizem 120 mg daily.   3. Hypertension, unspecified type BP well-controlled. No medications at this time. CTM.   4. Closed nondisplaced intertrochanteric fracture of right femur, initial encounter 09/2022 Patient has lived in long term care since her hip fracture after a fall. Maintaining periodic ambulation with walker. Continue to monitor   5. CKD (chronic kidney disease), stage IV Patient with significant renal  disease. Continue routine collection of kidney function. Avoid nephrotoxic medications as possible and when indicated.   6. Macular degeneration (senile) of retina Significant vision changes. Contributing to patient's dependence.  7. Dementia Patient has had progressive memory changes that have contributed to her need for support at home. Now living long term care in facility. Continue goals of care conversations given patient's recent issues with weight loss, however, now stabilized. Continue to monitor for changes in behavior, difficulty sleeping, etc.    Family/ staff Communication: Nursing  Labs/tests ordered:  BMP, CBC, Vitamin D, PTH

## 2023-03-16 ENCOUNTER — Ambulatory Visit: Payer: Medicare HMO | Admitting: Cardiology

## 2023-04-05 LAB — BASIC METABOLIC PANEL
BUN: 29 — AB (ref 4–21)
CO2: 28 — AB (ref 13–22)
Chloride: 104 (ref 99–108)
Creatinine: 1.5 — AB (ref 0.5–1.1)
Glucose: 80
Potassium: 4 mEq/L (ref 3.5–5.1)
Sodium: 138 (ref 137–147)

## 2023-04-05 LAB — COMPREHENSIVE METABOLIC PANEL
Calcium: 9.5 (ref 8.7–10.7)
eGFR: 31

## 2023-04-05 LAB — VITAMIN D 25 HYDROXY (VIT D DEFICIENCY, FRACTURES): Vit D, 25-Hydroxy: 13

## 2023-04-05 LAB — CBC AND DIFFERENTIAL
HCT: 33 — AB (ref 36–46)
Hemoglobin: 11.3 — AB (ref 12.0–16.0)
Neutrophils Absolute: 2886
Platelets: 233 10*3/uL (ref 150–400)
WBC: 6

## 2023-04-05 LAB — CBC: RBC: 3.51 — AB (ref 3.87–5.11)

## 2023-05-15 ENCOUNTER — Other Ambulatory Visit: Payer: Self-pay | Admitting: *Deleted

## 2023-05-15 ENCOUNTER — Encounter: Payer: Self-pay | Admitting: Student

## 2023-05-15 NOTE — Progress Notes (Signed)
Pamela Paul asked to have Medication list updated for her Patient list for Thursday.

## 2023-05-17 ENCOUNTER — Encounter: Payer: Self-pay | Admitting: Nurse Practitioner

## 2023-05-17 ENCOUNTER — Non-Acute Institutional Stay (SKILLED_NURSING_FACILITY): Payer: Medicare HMO | Admitting: Nurse Practitioner

## 2023-05-17 DIAGNOSIS — N2581 Secondary hyperparathyroidism of renal origin: Secondary | ICD-10-CM | POA: Diagnosis not present

## 2023-05-17 DIAGNOSIS — N184 Chronic kidney disease, stage 4 (severe): Secondary | ICD-10-CM

## 2023-05-17 DIAGNOSIS — R634 Abnormal weight loss: Secondary | ICD-10-CM

## 2023-05-17 DIAGNOSIS — D6869 Other thrombophilia: Secondary | ICD-10-CM

## 2023-05-17 DIAGNOSIS — I10 Essential (primary) hypertension: Secondary | ICD-10-CM | POA: Diagnosis not present

## 2023-05-17 DIAGNOSIS — E559 Vitamin D deficiency, unspecified: Secondary | ICD-10-CM | POA: Diagnosis not present

## 2023-05-17 DIAGNOSIS — I480 Paroxysmal atrial fibrillation: Secondary | ICD-10-CM

## 2023-05-17 DIAGNOSIS — F03B0 Unspecified dementia, moderate, without behavioral disturbance, psychotic disturbance, mood disturbance, and anxiety: Secondary | ICD-10-CM

## 2023-05-17 MED ORDER — VITAMIN D (ERGOCALCIFEROL) 1.25 MG (50000 UNIT) PO CAPS
50000.0000 [IU] | ORAL_CAPSULE | ORAL | Status: DC
Start: 2023-05-17 — End: 2023-11-08

## 2023-05-17 NOTE — Progress Notes (Signed)
Location:      Place of Service:     Earnestine Mealing, MD  Patient Care Team: Earnestine Mealing, MD as PCP - General (Family Medicine) Debbe Odea, MD as PCP - Cardiology (Cardiology) Scarlett Presto, RN (Inactive) as Oncology Nurse Navigator Janyth Contes, Janene Harvey, NP as Nurse Practitioner (Geriatric Medicine)  Extended Emergency Contact Information Primary Emergency Contact: Cheatwood,david Address: [address]          [place], [place] [address] Darden Amber of [place] Home Phone: [phone] Mobile Phone: [phone] Relation: Son  Goals of care: Advanced Directive information    03/07/2023    9:47 AM  Advanced Directives  Does Patient Have a Medical Advance Directive? Yes  Type of Estate agent of [place];Living will;Out of facility DNR (pink MOST or yellow form)  Does patient want to make changes to medical advance directive? No - Patient declined  Copy of Healthcare Power of Attorney in Chart? No - copy requested     Chief Complaint  Patient presents with   Medical Management of Chronic Issues    Routine follow up    HPI:  Pt is a 89 y.o. female seen today for medical management of chronic disease.  She has been having a gradual weight loss over the last few months. Does not like the supplements given at facility.  She has not complaints today and reports things are going well.  No pain Nursing does not have any concerns.  She has recently been placed on vit d supplement for low D level.     Past Medical History:  Diagnosis Date   Breast cancer (HCC)    CKD (chronic kidney disease) stage 3, GFR 30-59 ml/min (HCC)    Displaced intertrochanteric fracture of right femur (HCC)    Per [facility], [employer] [facility]   Former smoker    Heart disease    mitral valvular   Hyperlipidemia    Hyperparathyroidism, secondary (HCC)    Hypertension    Hypo-osmolality and hyponatremia    Per [facility], [facility]   Hypothyroidism     Macular degeneration    Per [employer] [facility] [facility], [facility] [employer] [facility]   Sensorineural hearing loss    Spinal stenosis of lumbar region 2006   s/p surgery   UTI (urinary tract infection)    Per [facility] EMR, [employer] Care   Vitamin D deficiency    Per [facility], [employer] Care   Past Surgical History:  Procedure Laterality Date   ABDOMINAL HYSTERECTOMY     bilateral cataract surgery     BREAST LUMPECTOMY Right 07/22/2021   CHOLECYSTECTOMY  12/04/1978   FRACTURE SURGERY Left 12/29/2018   FEMUR   INTRAMEDULLARY (IM) NAIL INTERTROCHANTERIC Right 09/21/2022   Procedure: INTRAMEDULLARY (IM) NAIL INTERTROCHANTERIC;  Surgeon: Juanell Fairly, MD;  Location: [facility] ORS;  Service: Orthopedics;  Laterality: Right;   JOINT REPLACEMENT     Bilateral knee   LAPAROSCOPIC SIGMOID COLECTOMY  12/04/2009   secondary to benign mass   MELANOMA EXCISION WITH SENTINEL LYMPH NODE BIOPSY Right 07/22/2021   Procedure: MELANOMA EXCISION WITH SENTINEL LYMPH NODE BIOPSY;  Surgeon: Carolan Shiver, MD;  Location: [facility] ORS;  Service: General;  Laterality: Right;   PART MASTECTOMY,RADIO FREQUENCY LOCALIZER,AXILLARY SENTINEL NODE BIOPSY Right 07/22/2021   Procedure: PART MASTECTOMY,RADIO FREQUENCY LOCALIZER,AXILLARY SENTINEL NODE BIOPSY;  Surgeon: Carolan Shiver, MD;  Location: [facility] OR[facility];  Service: General;  Laterality: Right;   REPLACEMENT TOTAL KNEE BILATERAL  SMALL INTESTINE SURGERY  12/04/2008   for SBO   SPINE SURGERY  12/04/2004   [facility]ds, [facility]   tendon repair     achille heall, left     Allergies  Allergen Reactions   Bactrim [Sulfamethoxazole-Trimethoprim] Nausea And Vomiting    Lethargic and weakness    Outpatient Encounter Medications as of 05/17/2023  Medication Sig   Vitamin D, Ergocalciferol, (DRISDOL) 1.25 MG (50000 UNIT) CAPS capsule Take 1 capsule (50,000 Units total) by mouth every 7 (seven) days.   acetaminophen (TYLENOL) 325 MG tablet Take 650 mg by mouth every 6 (six)  hours as needed. Give One tablet by mouth every 6 hours as needed.   alum & mag hydroxide-simeth (MAALOX/MYLANTA) 200-200-20 MG/5ML suspension Take 30 mLs by mouth every 4 (four) hours as needed for indigestion.   apixaban (ELIQUIS) 2.5 MG TABS tablet Take 2.5 mg by mouth 2 (two) times daily.   bisacodyl (DULCOLAX) 10 MG suppository Place 1 suppository (10 mg total) rectally daily as needed for moderate constipation.   Calcium Carb-Cholecalciferol (RA CALCIUM PLUS VITAMIN D3) 600-10 MG-MCG TABS Take 1 tablet by mouth 2 (two) times daily.   diltiazem (CARDIZEM LA) 120 MG 24 hr tablet Take 120 mg by mouth daily.   levothyroxine (SYNTHROID) 50 MCG tablet TAKE 1 TABLET EVERY DAY ON EMPTY STOMACHWITH A GLASS OF WATER AT LEAST 30-60 MINBEFORE BREAKFAST   loperamide (IMODIUM) 1 MG/5ML solution Take 30 mLs by mouth every 4 (four) hours as needed for diarrhea or loose stools.   Magnesium Oxide 250 MG TABS Take 1 tablet by mouth every other day. At bedtime   Menthol-Methyl Salicylate (SALONPAS PAIN RELIEF PATCH EX) Apply to elbow topically to elbow topically every 6 hours as needed.   Multiple Vitamins-Minerals (PRESERVISION AREDS PO) Take 1 capsule by mouth 2 (two) times daily.   ondansetron (ZOFRAN) 4 MG tablet Take 1 tablet (4 mg total) by mouth every 6 (six) hours as needed for nausea.   polyethylene glycol (MIRALAX / GLYCOLAX) 17 g packet Take 17 g by mouth daily as needed for mild constipation.   No facility-administered encounter medications on file as of 05/17/2023.    Review of Systems  Constitutional:  Negative for activity change, appetite change, fatigue and unexpected weight change.  HENT:  Negative for congestion and hearing loss.   Eyes: Negative.   Respiratory:  Negative for cough and shortness of breath.   Cardiovascular:  Negative for chest pain, palpitations and leg swelling.  Gastrointestinal:  Negative for abdominal pain, constipation and diarrhea.  Genitourinary:  Negative for  difficulty urinating and dysuria.  Musculoskeletal:  Negative for arthralgias and myalgias.  Skin:  Negative for color change and wound.  Neurological:  Negative for dizziness and weakness.  Psychiatric/Behavioral:  Negative for agitation, behavioral problems and confusion.      Immunization History  Administered Date(s) Administered   Influenza Split 10/03/2014, 10/04/2015   Influenza, High Dose Seasonal PF 10/04/2019, 09/21/2020, 09/20/2022   Influenza-Unspecified 09/03/2013, 10/01/2016, 10/06/2017, 09/18/2018   Moderna Covid-19 Vaccine Bivalent Booster 76yrs & up 04/21/2021   Moderna Sars-Covid-2 Vaccination 12/22/2019, 01/19/2020, 10/19/2020, 08/08/2021, 10/13/2022   Pneumococcal Conjugate-13 10/26/2014   Pneumococcal Polysaccharide-23 04/04/1993, 09/20/2008, 01/28/2016   Tdap 02/14/2013   Zoster Recombinat (Shingrix) 02/07/2017, 06/18/2017   Zoster, Live 12/08/2009   Pertinent  Health Maintenance Due  Topic Date Due   INFLUENZA VACCINE  07/05/2023   DEXA SCAN  Completed      09/24/2022   11:00 AM 09/25/2022  12:00 AM 09/25/2022    8:00 AM 09/27/2022    2:43 PM 03/09/2023    8:40 PM  Fall Risk  Falls in the past year?     1  Was there an injury with Fall?     1  Fall Risk Category Calculator     3  (RETIRED) Patient Fall Risk Level High fall risk High fall risk High fall risk High fall risk   Patient at Risk for Falls Due to     History of fall(s);Impaired balance/gait;Impaired mobility;Mental status change;Impaired vision  Fall risk Follow up     Falls evaluation completed   Functional Status Survey:    Vitals:   05/17/23 1445  BP: 113/70  Pulse: 76  Resp: 20  Weight: 174 lb 12.8 oz (79.3 kg)   Body mass index is 29.09 kg/m. Physical Exam Constitutional:      General: She is not in acute distress.    Appearance: She is well-developed. She is not diaphoretic.  HENT:     Head: Normocephalic and atraumatic.     Mouth/Throat:     Pharynx: No oropharyngeal  exudate.  Eyes:     Conjunctiva/sclera: Conjunctivae normal.     Pupils: Pupils are equal, round, and reactive to light.  Cardiovascular:     Rate and Rhythm: Normal rate and regular rhythm.     Heart sounds: Normal heart sounds.  Pulmonary:     Effort: Pulmonary effort is normal.     Breath sounds: Normal breath sounds.  Abdominal:     General: Bowel sounds are normal.     Palpations: Abdomen is soft.  Musculoskeletal:     Cervical back: Normal range of motion and neck supple.     Right lower leg: No edema.     Left lower leg: No edema.  Skin:    General: Skin is warm and dry.  Neurological:     Mental Status: She is alert. Mental status is at baseline.  Psychiatric:        Mood and Affect: Mood normal.     Labs reviewed: Recent Labs    09/21/22 2149 09/22/22 0314 09/24/22 0526 09/25/22 0304 09/27/22 1446 09/28/22 0000 12/07/22 0000 01/04/23 0000 04/05/23 0000  NA  --    < > 132* 132* 135   < > 138 138 138  K  --    < > 4.2 4.0 3.8   < > 4.1 4.0 4.0  CL  --    < > 105 103 101   < > 102 103 104  CO2  --    < > 24 25 25    < > 28* 28* 28*  GLUCOSE  --    < > 112* 95 117*  --   --   --   --   BUN  --    < > 16 16 18    < > 18 17 29*  CREATININE  --    < > 1.26* 1.28* 1.34*   < > 1.8* 1.2* 1.5*  CALCIUM  --    < > 7.8* 7.7* 9.2   < > 9.1 9.2 9.5  MG 1.6*  --   --   --  2.0  --   --   --   --   PHOS 2.9  --   --   --   --   --   --   --   --    < > = values in this interval  not displayed.   Recent Labs    09/27/22 1446 11/26/22 0000 01/04/23 0000  AST 40 21 17  ALT 23 15 11   ALKPHOS 78 105 76  BILITOT 2.2*  --   --   PROT 6.8  --   --   ALBUMIN 3.3* 3.6 3.3*   Recent Labs    09/24/22 0526 09/25/22 0304 09/27/22 1446 09/28/22 0000 11/26/22 0000 01/04/23 0000 04/05/23 0000  WBC 8.5 8.6 9.0   < > 9.0 6.0 6.0  NEUTROABS  --   --  6.1   < > 6,363.00 2,904.00 2,886.00  HGB 8.1* 7.6* 9.0*   < > 12.2 10.7* 11.3*  HCT 23.9* 22.3* 27.7*   < > 37 32* 33*  MCV  92.3 94.1 96.5  --   --   --   --   PLT 187 198 337   < > 306 271 233   < > = values in this interval not displayed.   Lab Results  Component Value Date   TSH 3.52 01/04/2023   Lab Results  Component Value Date   HGBA1C 5.8 03/08/2020   Lab Results  Component Value Date   CHOL 213 (H) 08/17/2021   HDL 45 (L) 08/17/2021   LDLCALC 114 (H) 08/17/2021   LDLDIRECT 132.0 07/12/2017   TRIG 377 (H) 08/17/2021   CHOLHDL 4.7 08/17/2021    Significant Diagnostic Results in last 30 days:  No results found.  Assessment/Plan 1. Vitamin D deficiency - Vitamin D, Ergocalciferol, (DRISDOL) 1.25 MG (50000 UNIT) CAPS capsule; Take 1 capsule (50,000 Units total) by mouth every 7 (seven) days.  Dispense: 5 capsule  2. Secondary hyperparathyroidism of renal origin [facility]) Labs stable, will continue to monitor.   3. Paroxysmal atrial fibrillation (HCC) Rate controlled on Cardizem   4. Hypertension, unspecified type -Blood pressure well controlled, goal bp <140/90 Continue current medications and dietary modifications follow metabolic panel  5. CKD (chronic kidney disease), stage IV (HCC) -Chronic and stable Encourage proper hydration Follow metabolic panel Avoid nephrotoxic meds (NSAIDS)  6. Moderate dementia without behavioral disturbance, psychotic disturbance, mood disturbance, or anxiety, unspecified dementia type (HCC) -Stable, no acute changes in cognitive or functional status, continue supportive care.   7. Weight loss, unintentional -will continue to monitor, she does not like the supplements provided.   8. Acquired thrombophilia (HCC) Due to a fib, continues on eliquis 2.5 mg BID.    Janene Harvey. Biagio [facility] [phone]

## 2023-07-10 ENCOUNTER — Encounter: Payer: Self-pay | Admitting: Nurse Practitioner

## 2023-07-10 ENCOUNTER — Non-Acute Institutional Stay: Payer: Self-pay | Admitting: Nurse Practitioner

## 2023-07-10 DIAGNOSIS — Z0000 Encounter for general adult medical examination without abnormal findings: Secondary | ICD-10-CM

## 2023-07-10 NOTE — Patient Instructions (Signed)
  Pamela Paul , Thank you for taking time to come for your Medicare Wellness Visit. I appreciate your ongoing commitment to your health goals. Please review the following plan we discussed and let me know if I can assist you in the future.   These are the goals we discussed:  Goals       Maintain Healthy Lifestyle (pt-stated)      Stay active Healthy diet        This is a list of the screening recommended for you and due dates:  Health Maintenance  Topic Date Due   COVID-19 Vaccine (7 - 2023-24 season) 12/08/2022   DTaP/Tdap/Td vaccine (2 - Td or Tdap) 02/15/2023   Flu Shot  07/05/2023   Medicare Annual Wellness Visit  07/09/2024   Pneumonia Vaccine  Completed   DEXA scan (bone density measurement)  Completed   Zoster (Shingles) Vaccine  Completed   HPV Vaccine  Aged Out

## 2023-07-10 NOTE — Progress Notes (Signed)
Subjective:   Pamela Paul is a 89 y.o. female who presents for Medicare Annual (Subsequent) preventive examination.  Visit Complete: In person at [facility]  Review of Systems     Cardiac Risk Factors include: advanced age (>63men, >12 women);sedentary lifestyle     Objective:    Today's Vitals   07/10/23 1619  BP: 119/73  Pulse: 73  Resp: 20  Temp: (!) 97.3 F (36.3 C)  SpO2: 98%  Weight: 179 lb 6.4 oz (81.4 kg)  Height: 5\' 5"  (1.651 m)   Body mass index is 29.85 kg/m.     07/10/2023    4:28 PM 03/07/2023    9:47 AM 01/02/2023    3:59 PM 11/30/2022    4:08 PM 11/29/2022    1:28 PM 11/02/2022   12:50 PM 10/04/2022   11:19 AM  Advanced Directives  Does Patient Have a Medical Advance Directive? Yes Yes Yes Yes Yes Yes Yes  Type of Estate agent of [place];Out of facility DNR (pink MOST or yellow form) Healthcare Power of [place];Living will;Out of facility DNR (pink MOST or yellow form) Living will;Out of facility DNR (pink MOST or yellow form) Living will;Out of facility DNR (pink MOST or yellow form) Healthcare Power of [place];Out of facility DNR (pink MOST or yellow form);Living will Out of facility DNR (pink MOST or yellow form) Out of facility DNR (pink MOST or yellow form)  Does patient want to make changes to medical advance directive? No - Patient declined No - Patient declined No - Patient declined No - Patient declined No - Patient declined No - Patient declined No - Patient declined  Copy of Healthcare Power of Attorney in Chart? Yes - validated most recent copy scanned in chart (See row information) No - copy requested   Yes - validated most recent copy scanned in chart (See row information)    Pre-existing out of facility DNR order (yellow form or pink MOST form)   Yellow form placed in chart (order not valid for inpatient use) Yellow form placed in chart (order not valid for inpatient use)       Current Medications  (verified) Outpatient Encounter Medications as of 07/10/2023  Medication Sig   acetaminophen (TYLENOL) 500 MG tablet Take 1,000 mg by mouth every 8 (eight) hours as needed.   alum & mag hydroxide-simeth (MAALOX/MYLANTA) 200-200-20 MG/5ML suspension Take 30 mLs by mouth every 4 (four) hours as needed for indigestion.   apixaban (ELIQUIS) 2.5 MG TABS tablet Take 2.5 mg by mouth 2 (two) times daily.   bisacodyl (DULCOLAX) 10 MG suppository Place 1 suppository (10 mg total) rectally daily as needed for moderate constipation.   Calcium Carb-Cholecalciferol (RA CALCIUM PLUS VITAMIN D3) 600-10 MG-MCG TABS Take 1 tablet by mouth 2 (two) times daily.   cholecalciferol (VITAMIN D3) 25 MCG (1000 UNIT) tablet Take 1,000 Units by mouth daily.   diltiazem (CARDIZEM LA) 120 MG 24 hr tablet Take 120 mg by mouth daily.   levothyroxine (SYNTHROID) 50 MCG tablet TAKE 1 TABLET EVERY DAY ON EMPTY STOMACHWITH A GLASS OF WATER AT LEAST 30-60 MINBEFORE BREAKFAST   loperamide (IMODIUM) 1 MG/5ML solution Take 30 mLs by mouth every 4 (four) hours as needed for diarrhea or loose stools.   Magnesium Oxide 250 MG TABS Take 1 tablet by mouth every other day. At bedtime   Menthol-Methyl Salicylate (SALONPAS PAIN RELIEF PATCH EX) Apply to elbow topically to elbow topically every 6 hours as needed.   Multiple Vitamins-Minerals (PRESERVISION  AREDS PO) Take 1 capsule by mouth 2 (two) times daily.   ondansetron (ZOFRAN) 4 MG tablet Take 1 tablet (4 mg total) by mouth every 6 (six) hours as needed for nausea.   polyethylene glycol (MIRALAX / GLYCOLAX) 17 g packet Take 17 g by mouth daily as needed for mild constipation.   VAGINAL LUBRICANT VA Place 1 strip vaginally every 8 (eight) hours as needed.   Vitamin D, Ergocalciferol, (DRISDOL) 1.25 MG (50000 UNIT) CAPS capsule Take 1 capsule (50,000 Units total) by mouth every 7 (seven) days.   [DISCONTINUED] acetaminophen (TYLENOL) 325 MG tablet Take 650 mg by mouth every 6 (six) hours as  needed. Give One tablet by mouth every 6 hours as needed.   No facility-administered encounter medications on file as of 07/10/2023.    Allergies (verified) Bactrim [sulfamethoxazole-trimethoprim]   History: Past Medical History:  Diagnosis Date   Breast cancer (HCC)    CKD (chronic kidney disease) stage 3, GFR 30-59 ml/min (HCC)    Displaced intertrochanteric fracture of right femur (HCC)    Per [facility] EMR, [employer] Care   Former smoker    Heart disease    mitral valvular   Hyperlipidemia    Hyperparathyroidism, secondary (HCC)    Hypertension    Hypo-osmolality and hyponatremia    Per [facility] EMR, [employer] Care   Hypothyroidism    Macular degeneration    Per [employer] EMR, [employer] Care   Sensorineural hearing loss    Spinal stenosis of lumbar region 2006   s/p surgery   UTI (urinary tract infection)    Per [facility] EMR, [employer] Care   Vitamin D deficiency    Per [facility] EMR, [employer] Care   Past Surgical History:  Procedure Laterality Date   ABDOMINAL HYSTERECTOMY     bilateral cataract surgery     BREAST LUMPECTOMY Right 07/22/2021   CHOLECYSTECTOMY  12/04/1978   FRACTURE SURGERY Left 12/29/2018   FEMUR   INTRAMEDULLARY (IM) NAIL INTERTROCHANTERIC Right 09/21/2022   Procedure: INTRAMEDULLARY (IM) NAIL INTERTROCHANTERIC;  Surgeon: Juanell Fairly, MD;  Location: [facility] ORS;  Service: Orthopedics;  Laterality: Right;   JOINT REPLACEMENT     Bilateral knee   LAPAROSCOPIC SIGMOID COLECTOMY  12/04/2009   secondary to benign mass   MELANOMA EXCISION WITH SENTINEL LYMPH NODE BIOPSY Right 07/22/2021   Procedure: MELANOMA EXCISION WITH SENTINEL LYMPH NODE BIOPSY;  Surgeon: Carolan Shiver, MD;  Location: [facility] ORS;  Service: General;  Laterality: Right;   PART MASTECTOMY,RADIO FREQUENCY LOCALIZER,AXILLARY SENTINEL NODE BIOPSY Right 07/22/2021   Procedure: PART MASTECTOMY,RADIO FREQUENCY LOCALIZER,AXILLARY SENTINEL NODE BIOPSY;  Surgeon:  Carolan Shiver, MD;  Location: [facility] ORS;  Service: General;  Laterality: Right;   REPLACEMENT TOTAL KNEE BILATERAL     SMALL INTESTINE SURGERY  12/04/2008   for SBO   SPINE SURGERY  12/04/2004   [facility], [facility]   tendon repair     achille heall, left    Family History  Problem Relation Age of Onset   Arthritis Mother    Stroke Mother    Hyperlipidemia Father    Cancer Father    Heart disease Paternal Aunt    Social History   Socioeconomic History   Marital status: Widowed    Spouse name: Not on file   Number of children: 5   Years of education: Not on file   Highest education level: Not on file  Occupational History   Occupation: Preschool Engineer, maintenance  Comment: Retired  Tobacco Use   Smoking status: Former    Current packs/day: 0.00    Types: Cigarettes    Quit date: 03/15/1961    Years since quitting: 62.3   Smokeless tobacco: Never   Tobacco comments:    Quit many years ago.  Vaping Use   Vaping status: Never Used  Substance and Sexual Activity   Alcohol use: Not Currently   Drug use: No   Sexual activity: Never  Other Topics Concern   Not on file  Social History Narrative   2 daughters/3 sons      Has living will   Son Onalee Hua is [facility] POA   Has DNR   Would accept hospital care   Would consider tube feeds--but not if persistent cognitive unawareness   Social Determinants of Health   Financial Resource Strain: Low Risk  (03/02/2022)   Overall Financial Resource Strain (CARDIA)    Difficulty of Paying Living Expenses: Not hard at all  Food Insecurity: No Food Insecurity (09/22/2022)   Hunger Vital Sign    Worried About Running Out of Food in the Last Year: Never true    Ran Out of Food in the Last Year: Never true  Transportation Needs: No Transportation Needs (09/22/2022)   PRAPARE - Administrator, Civil Service (Medical): No    Lack of Transportation (Non-Medical): No  Physical Activity: Insufficiently Active (03/02/2022)    Exercise Vital Sign    Days of Exercise per Week: 2 days    Minutes of Exercise per Session: 60 min  Stress: No Stress Concern Present (03/02/2022)   [employer] of Occupational Health - Occupational Stress Questionnaire    Feeling of Stress : Not at all  Social Connections: Unknown (03/02/2022)   Social Connection and Isolation Panel [NHANES]    Frequency of Communication with Friends and Family: More than three times a week    Frequency of Social Gatherings with Friends and Family: Twice a week    Attends Religious Services: Not on Marketing executive or Organizations: Yes    Attends Banker Meetings: Not on file    Marital Status: Widowed    Tobacco Counseling Counseling given: Not Answered Tobacco comments: Quit many years ago.   Clinical Intake:  Pre-visit preparation completed: Yes  Pain : No/denies pain     BMI - recorded: 29 Nutritional Status: BMI 25 -29 Overweight Nutritional Risks: None Diabetes: No  How often do you need to have someone help you when you read instructions, pamphlets, or other written materials from your doctor or pharmacy?: 1 - Never         Activities of Daily Living    07/10/2023    4:10 PM 03/09/2023    8:41 PM  In your present state of health, do you have any difficulty performing the following activities:  Hearing? 1 1  Vision? 1 1  Difficulty concentrating or making decisions? 0 1  Walking or climbing stairs? 1 1  Dressing or bathing? 1 1  Doing errands, shopping? 1 1  Preparing Food and eating ? Y   Using the Toilet? N   In the past six months, have you accidently leaked urine? Y   Do you have problems with loss of bowel control? N   Managing your Medications? Y   Managing your Finances? Y   Housekeeping or managing your Housekeeping? Y     Patient Care Team: Earnestine Mealing, MD as PCP -  General (Family Medicine) Debbe Odea, MD as PCP - Cardiology (Cardiology) Scarlett Presto, RN  (Inactive) as Oncology Nurse Navigator Janyth Contes, Janene Harvey, NP as Nurse Practitioner (Geriatric Medicine)  Indicate any recent Medical Services you may have received from other than [facility] providers in the past year (date may be approximate).     Assessment:   This is a routine wellness examination for [place].  Hearing/Vision screen No results found.  Dietary issues and exercise activities discussed:     Goals Addressed   None    Depression Screen    07/10/2023    4:15 PM 03/02/2022    1:59 PM 09/13/2021    1:50 PM 08/17/2021   10:05 AM 05/31/2021    3:36 PM 03/01/2021    8:50 AM 02/02/2020    8:53 AM  PHQ 2/9 Scores  PHQ - 2 Score 0 0 0 0 0 0 0    Fall Risk    07/10/2023    4:14 PM 03/09/2023    8:40 PM 03/02/2022    2:02 PM 09/13/2021    1:48 PM 08/17/2021   10:05 AM  Fall Risk   Falls in the past year? 1 1 0 1 1  Number falls in past yr: 1 1 0 1 1  Injury with Fall? 1 1  0 0  Risk for fall due to : History of fall(s);Impaired balance/gait;Impaired mobility History of fall(s);Impaired balance/gait;Impaired mobility;Mental status change;Impaired vision   History of fall(s)  Follow up  Falls evaluation completed Falls evaluation completed Falls evaluation completed Falls evaluation completed    MEDICARE RISK AT HOME:   TIMED UP AND GO:  Was the test performed?  No    Cognitive Function:    10/11/2016    8:32 AM  MMSE - Mini Mental State Exam  Orientation to time 5  Orientation to Place 5  Registration 3  Attention/ Calculation 5  Recall 3  Language- name 2 objects 2  Language- repeat 1  Language- follow 3 step command 3  Language- read & follow direction 1  Write a sentence 1  Copy design 1  Total score 30        03/02/2022    2:05 PM 02/02/2020    9:08 AM 01/31/2019    9:45 AM 11/02/2017   12:48 PM 10/11/2016    8:34 AM  6CIT Screen  What Year? 0 points 0 points 0 points 0 points 0 points  What month? 0 points 0 points 0 points 0 points 0 points  What  time? 0 points 0 points 0 points 0 points 0 points  Count back from 20 0 points 0 points 0 points 0 points 0 points  Months in reverse  0 points 0 points 0 points 0 points  Repeat phrase  0 points  0 points 0 points  Total Score  0 points  0 points 0 points    Immunizations Immunization History  Administered Date(s) Administered   Covid-19, Mrna,Vaccine(Spikevax)47yrs and older 03/13/2023   Influenza Split 10/03/2014, 10/04/2015   Influenza, High Dose Seasonal PF 10/04/2019, 09/21/2020, 09/20/2022   Influenza-Unspecified 09/03/2013, 10/01/2016, 10/06/2017, 09/18/2018   Moderna Covid-19 Vaccine Bivalent Booster 85yrs & up 04/21/2021   Moderna Sars-Covid-2 Vaccination 12/22/2019, 01/19/2020, 10/19/2020, 08/08/2021, 10/13/2022   PNEUMOCOCCAL CONJUGATE-20 10/04/2022   Pneumococcal Conjugate-13 10/26/2014   Pneumococcal Polysaccharide-23 04/04/1993, 09/20/2008, 01/28/2016   Tdap 02/14/2013   Zoster Recombinant(Shingrix) 02/07/2017, 06/18/2017   Zoster, Live 12/08/2009    TDAP status: Due, Education has been provided regarding the  importance of this vaccine. Advised may receive this vaccine at local pharmacy or Health Dept. Aware to provide a copy of the vaccination record if obtained from local pharmacy or Health Dept. Verbalized acceptance and understanding.  Flu Vaccine status: Due, Education has been provided regarding the importance of this vaccine. Advised may receive this vaccine at local pharmacy or Health Dept. Aware to provide a copy of the vaccination record if obtained from local pharmacy or Health Dept. Verbalized acceptance and understanding.  Pneumococcal vaccine status: Up to date  Covid-19 vaccine status: Information provided on how to obtain vaccines.   Qualifies for Shingles Vaccine? Yes   Zostavax completed No   Shingrix Completed?: Yes  Screening Tests Health Maintenance  Topic Date Due   DTaP/Tdap/Td (2 - Td or Tdap) 02/15/2023   COVID-19 Vaccine (8 - 2023-24  season) 05/08/2023   INFLUENZA VACCINE  07/05/2023   Medicare Annual Wellness (AWV)  07/09/2024   Pneumonia Vaccine 57+ Years old  Completed   DEXA SCAN  Completed   Zoster Vaccines- Shingrix  Completed   HPV VACCINES  Aged Out    Health Maintenance  Health Maintenance Due  Topic Date Due   DTaP/Tdap/Td (2 - Td or Tdap) 02/15/2023   COVID-19 Vaccine (8 - 2023-24 season) 05/08/2023   INFLUENZA VACCINE  07/05/2023    Colorectal cancer screening: No longer required.   Mammogram status: No longer required due to age.  Bone Density status: Completed 9/22. Results reflect: Bone density results: NORMAL. Repeat every 5 years.  Lung Cancer Screening: (Low Dose CT Chest recommended if Age 70-80 years, 20 pack-year currently smoking OR have quit w/in 15years.) does not qualify.   Lung Cancer Screening Referral: na  Additional Screening:  Hepatitis C Screening: does not qualify; Completed   Vision Screening: Recommended annual ophthalmology exams for early detection of glaucoma and other disorders of the eye. Is the patient up to date with their annual eye exam?  Yes  Who is the provider or what is the name of the office in which the patient attends annual eye exams?  eye If pt is not established with a provider, would they like to be referred to a provider to establish care? No .   Dental Screening: Recommended annual dental exams for proper oral hygiene   Community Resource Referral / Chronic Care Management: CRR required this visit?  No   CCM required this visit?  No     Plan:     I have personally reviewed and noted the following in the patient's chart:   Medical and social history Use of alcohol, tobacco or illicit drugs  Current medications and supplements including opioid prescriptions. Patient is not currently taking opioid prescriptions. Functional ability and status Nutritional status Physical activity Advanced directives List of other  physicians Hospitalizations, surgeries, and ER visits in previous 12 months Vitals Screenings to include cognitive, depression, and falls Referrals and appointments  In addition, I have reviewed and discussed with patient certain preventive protocols, quality metrics, and best practice recommendations. A written personalized care plan for preventive services as well as general preventive health recommendations were provided to patient.     Sharon Seller, NP   07/10/2023

## 2023-07-20 ENCOUNTER — Encounter: Payer: Self-pay | Admitting: Student

## 2023-07-20 ENCOUNTER — Non-Acute Institutional Stay: Payer: Self-pay | Admitting: Student

## 2023-07-20 DIAGNOSIS — E039 Hypothyroidism, unspecified: Secondary | ICD-10-CM

## 2023-07-20 DIAGNOSIS — I10 Essential (primary) hypertension: Secondary | ICD-10-CM

## 2023-07-20 DIAGNOSIS — E559 Vitamin D deficiency, unspecified: Secondary | ICD-10-CM | POA: Diagnosis not present

## 2023-07-20 DIAGNOSIS — N2581 Secondary hyperparathyroidism of renal origin: Secondary | ICD-10-CM

## 2023-07-20 DIAGNOSIS — I480 Paroxysmal atrial fibrillation: Secondary | ICD-10-CM

## 2023-07-20 DIAGNOSIS — Z170 Estrogen receptor positive status [ER+]: Secondary | ICD-10-CM

## 2023-07-20 DIAGNOSIS — N184 Chronic kidney disease, stage 4 (severe): Secondary | ICD-10-CM

## 2023-07-20 DIAGNOSIS — H3530 Unspecified macular degeneration: Secondary | ICD-10-CM

## 2023-07-20 DIAGNOSIS — F03B0 Unspecified dementia, moderate, without behavioral disturbance, psychotic disturbance, mood disturbance, and anxiety: Secondary | ICD-10-CM

## 2023-07-20 DIAGNOSIS — C50411 Malignant neoplasm of upper-outer quadrant of right female breast: Secondary | ICD-10-CM

## 2023-07-20 NOTE — Progress Notes (Unsigned)
[facility] Room Number: 513-A Place of Service:  SNF [phone]) Provider:  Earnestine Mealing, MD  Patient Care Team: Earnestine Mealing, MD as PCP - General (Family Medicine) Debbe Odea, MD as PCP - Cardiology (Cardiology) Scarlett Presto, RN (Inactive) as Oncology Nurse Navigator Sharon Seller, NP as Nurse Practitioner (Geriatric Medicine)  Extended Emergency Contact Information Primary Emergency Contact: Vinzant,david Address: [address]          [place], [place] [address] Darden Amber of [place] Home Phone: [phone] Mobile Phone: [phone] Relation: Son  Code Status:  DNR Goals of care: Advanced Directive information    07/20/2023    9:09 AM  Advanced Directives  Does Patient Have a Medical Advance Directive? Yes  Type of Estate agent of [place];Out of facility DNR (pink MOST or yellow form)  Does patient want to make changes to medical advance directive? No - Patient declined  Copy of Healthcare Power of Attorney in Chart? Yes - validated most recent copy scanned in chart (See row information)  Pre-existing out of facility DNR order (yellow form or pink MOST form) Yellow form placed in chart (order not valid for inpatient use)     Chief Complaint  Patient presents with   Medical Management of Chronic Issues    Routine visit. Discuss need for covid boosters and flu vaccine     HPI:  Pt is a 89 y.o. female seen today for medical management of chronic diseases.    She has questions about what medications she is on and why she is taking vitamin D.   Wt between 170-180  She keeps left fingers taped for arthritis to grip better. Eating well and having good bowel movements. Urinating fine.   Her vision is poor.   She is doing really well. Likes living here. Has a few friends. Tells stories of living here for the last 20 years and her grandchildren.   Nursing states she had a "spell" with therapy a couple of  weeks ago. She stood up quickly and the therapist helped guide er to prevent injury. Patient verified and denied other symptoms.    Past Medical History:  Diagnosis Date   Breast cancer (HCC)    CKD (chronic kidney disease) stage 3, GFR 30-59 ml/min (HCC)    Displaced intertrochanteric fracture of right femur (HCC)    Per [facility], [employer] Care   Former smoker    Heart disease    mitral valvular   Hyperlipidemia    Hyperparathyroidism, secondary (HCC)    Hypertension    Hypo-osmolality and hyponatremia    Per [facility] EMR, [employer] [facility]   Hypothyroidism    Macular degeneration    Per [employer] EMR, [employer] [facility]   Sensorineural hearing loss    Spinal stenosis of lumbar region 2006   s/p surgery   UTI (urinary tract infection)    Per [facility] EMR, [employer] Care   Vitamin D deficiency    Per [facility] EMR, [employer] Care   Past Surgical History:  Procedure Laterality Date   ABDOMINAL HYSTERECTOMY     bilateral cataract surgery     BREAST LUMPECTOMY Right 07/22/2021   CHOLECYSTECTOMY  12/04/1978   FRACTURE SURGERY Left 12/29/2018   FEMUR   INTRAMEDULLARY (IM) NAIL INTERTROCHANTERIC Right 09/21/2022   Procedure: INTRAMEDULLARY (IM) NAIL INTERTROCHANTERIC;  Surgeon: Juanell Fairly, MD;  Location: [facility] ORS;  Service: Orthopedics;  Laterality: Right;  JOINT REPLACEMENT     Bilateral knee   LAPAROSCOPIC SIGMOID COLECTOMY  12/04/2009   secondary to benign mass   MELANOMA EXCISION WITH SENTINEL LYMPH NODE BIOPSY Right 07/22/2021   Procedure: MELANOMA EXCISION WITH SENTINEL LYMPH NODE BIOPSY;  Surgeon: Carolan Shiver, MD;  Location: [facility] ORS;  Service: General;  Laterality: Right;   PART MASTECTOMY,RADIO FREQUENCY LOCALIZER,AXILLARY SENTINEL NODE BIOPSY Right 07/22/2021   Procedure: PART MASTECTOMY,RADIO FREQUENCY LOCALIZER,AXILLARY SENTINEL NODE BIOPSY;  Surgeon: Carolan Shiver, MD;  Location: [facility] ORS;  Service: General;  Laterality:  Right;   REPLACEMENT TOTAL KNEE BILATERAL     SMALL INTESTINE SURGERY  12/04/2008   for SBO   SPINE SURGERY  12/04/2004   [facility], [facility]   tendon repair     achille heall, left     Allergies  Allergen Reactions   Bactrim [Sulfamethoxazole-Trimethoprim] Nausea And Vomiting    Lethargic and weakness    Outpatient Encounter Medications as of 07/20/2023  Medication Sig   acetaminophen (TYLENOL) 500 MG tablet Take 1,000 mg by mouth every 8 (eight) hours as needed.   alum & mag hydroxide-simeth (MAALOX/MYLANTA) 200-200-20 MG/5ML suspension Take 30 mLs by mouth every 4 (four) hours as needed for indigestion.   apixaban (ELIQUIS) 2.5 MG TABS tablet Take 2.5 mg by mouth 2 (two) times daily.   bisacodyl (DULCOLAX) 10 MG suppository Place 1 suppository (10 mg total) rectally daily as needed for moderate constipation.   Calcium Carb-Cholecalciferol (RA CALCIUM PLUS VITAMIN D3) 600-10 MG-MCG TABS Take 1 tablet by mouth 2 (two) times daily.   cholecalciferol (VITAMIN D3) 25 MCG (1000 UNIT) tablet Take 1,000 Units by mouth daily.   diltiazem (CARDIZEM LA) 120 MG 24 hr tablet Take 120 mg by mouth daily.   levothyroxine (SYNTHROID) 50 MCG tablet TAKE 1 TABLET EVERY DAY ON EMPTY STOMACHWITH A GLASS OF WATER AT LEAST 30-60 MINBEFORE BREAKFAST   loperamide (IMODIUM) 1 MG/5ML solution Take 30 mLs by mouth every 4 (four) hours as needed for diarrhea or loose stools.   Magnesium Oxide 250 MG TABS Take 1 tablet by mouth every other day. At bedtime   Menthol-Methyl Salicylate (SALONPAS PAIN RELIEF PATCH EX) Apply to elbow topically to elbow topically every 6 hours as needed.   Multiple Vitamins-Minerals (PRESERVISION AREDS PO) Take 1 capsule by mouth 2 (two) times daily.   ondansetron (ZOFRAN) 4 MG tablet Take 1 tablet (4 mg total) by mouth every 6 (six) hours as needed for nausea.   polyethylene glycol (MIRALAX / GLYCOLAX) 17 g packet Take 17 g by mouth daily as needed for mild constipation.   VAGINAL  LUBRICANT VA Place 1 strip vaginally every 8 (eight) hours as needed.   Vitamin D, Ergocalciferol, (DRISDOL) 1.25 MG (50000 UNIT) CAPS capsule Take 1 capsule (50,000 Units total) by mouth every 7 (seven) days.   No facility-administered encounter medications on file as of 07/20/2023.    Review of Systems  Immunization History  Administered Date(s) Administered   Covid-19, Mrna,Vaccine(Spikevax)46yrs and older 03/13/2023   Influenza Split 10/03/2014, 10/04/2015   Influenza, High Dose Seasonal PF 10/04/2019, 09/21/2020, 09/20/2022   Influenza-Unspecified 09/03/2013, 10/01/2016, 10/06/2017, 09/18/2018   Moderna Covid-19 Vaccine Bivalent Booster 70yrs & up 04/21/2021   Moderna Sars-Covid-2 Vaccination 12/22/2019, 01/19/2020, 10/19/2020, 08/08/2021, 10/13/2022   PNEUMOCOCCAL CONJUGATE-20 10/04/2022   Pneumococcal Conjugate-13 10/26/2014   Pneumococcal Polysaccharide-23 04/04/1993, 09/20/2008, 01/28/2016   Tdap 02/14/2013, 07/11/2023   Zoster Recombinant(Shingrix) 02/07/2017, 06/18/2017   Zoster, Live 12/08/2009   Pertinent  Health Maintenance Due  Topic Date Due   INFLUENZA VACCINE  07/05/2023   DEXA SCAN  Completed      09/25/2022   12:00 AM 09/25/2022    8:00 AM 09/27/2022    2:43 PM 03/09/2023    8:40 PM 07/10/2023    4:14 PM  Fall Risk  Falls in the past year?    1 1  Was there an injury with Fall?    1 1  Fall Risk Category Calculator    3 3  (RETIRED) Patient Fall Risk Level High fall risk High fall risk High fall risk    Patient at Risk for Falls Due to    History of fall(s);Impaired balance/gait;Impaired mobility;Mental status change;Impaired vision History of fall(s);Impaired balance/gait;Impaired mobility  Fall risk Follow up    Falls evaluation completed    Functional Status Survey:    Vitals:   07/20/23 0908  BP: 109/70  Pulse: 75  Weight: 173 lb 9.6 oz (78.7 kg)  Height: 5\' 5"  (1.651 m)   Body mass index is 28.89 kg/m. Physical Exam Constitutional:       Appearance: Normal appearance. She is not ill-appearing.  HENT:     Head: Normocephalic.  Cardiovascular:     Rate and Rhythm: Normal rate. Rhythm irregular.     Pulses: Normal pulses.  Pulmonary:     Effort: Pulmonary effort is normal.     Breath sounds: Normal breath sounds.  Abdominal:     General: Abdomen is flat.     Palpations: Abdomen is soft.  Neurological:     Mental Status: She is alert.     Labs reviewed: Recent Labs    09/21/22 2149 09/22/22 0314 09/24/22 0526 09/25/22 0304 09/27/22 1446 09/28/22 0000 12/07/22 0000 01/04/23 0000 04/05/23 0000  NA  --    < > 132* 132* 135   < > 138 138 138  K  --    < > 4.2 4.0 3.8   < > 4.1 4.0 4.0  CL  --    < > 105 103 101   < > 102 103 104  CO2  --    < > 24 25 25    < > 28* 28* 28*  GLUCOSE  --    < > 112* 95 117*  --   --   --   --   BUN  --    < > 16 16 18    < > 18 17 29*  CREATININE  --    < > 1.26* 1.28* 1.34*   < > 1.8* 1.2* 1.5*  CALCIUM  --    < > 7.8* 7.7* 9.2   < > 9.1 9.2 9.5  MG 1.6*  --   --   --  2.0  --   --   --   --   PHOS 2.9  --   --   --   --   --   --   --   --    < > = values in this interval not displayed.   Recent Labs    09/27/22 1446 11/26/22 0000 01/04/23 0000  AST 40 21 17  ALT 23 15 11   ALKPHOS 78 105 76  BILITOT 2.2*  --   --   PROT 6.8  --   --   ALBUMIN 3.3* 3.6 3.3*   Recent Labs    09/24/22 0526 09/25/22 0304 09/27/22 1446 09/28/22 0000 11/26/22 0000 01/04/23 0000 04/05/23 0000  WBC 8.5 8.6 9.0   < >  9.0 6.0 6.0  NEUTROABS  --   --  6.1   < > 6,363.00 2,904.00 2,886.00  HGB 8.1* 7.6* 9.0*   < > 12.2 10.7* 11.3*  HCT 23.9* 22.3* 27.7*   < > 37 32* 33*  MCV 92.3 94.1 96.5  --   --   --   --   PLT 187 198 337   < > 306 271 233   < > = values in this interval not displayed.   Lab Results  Component Value Date   TSH 3.52 01/04/2023   Lab Results  Component Value Date   HGBA1C 5.8 03/08/2020   Lab Results  Component Value Date   CHOL 213 (H) 08/17/2021   HDL 45  (L) 08/17/2021   LDLCALC 114 (H) 08/17/2021   LDLDIRECT 132.0 07/12/2017   TRIG 377 (H) 08/17/2021   CHOLHDL 4.7 08/17/2021    Significant Diagnostic Results in last 30 days:  No results found.  Assessment/Plan 1. Vitamin D deficiency Patient recently started on Vitamin D supplementation. Will recheck levels as it has been 12 weeks, if at appropriate range will decrease to mainanence dose of 2000IU daily.   2. Secondary hyperparathyroidism of renal origin (HCC) Most recent PTH within normal range. Continue annual checks or if there are notable symptoms.   3. Paroxysmal atrial fibrillation (HCC) No signs of bleeding or palpitations. Continue apixiban 2.5 mg BID given CKD and continue cardizem. Consider decreasing dose if patient has signs of orthostatic hypotension. Ordered for nursing to perform given event.   4. Hypertension, unspecified type BP well-controlled.   5. CKD (chronic kidney disease), stage IV (HCC) Cr is stable at this itme, continue quarterly labs.   6. Moderate dementia without behavioral disturbance, psychotic disturbance, mood disturbance, or anxiety, unspecified dementia type (HCC) Memory is stable and no signs of acute decline at this time. 1 assist for ambulation, but remains fairly independent during the day.   7. Malignant neoplasm of upper-outer quadrant of right breast in female, estrogen receptor positive (HCC) Hx of breast cancer, stopped treatment 04/27/22. Will monitor for changes.    9. Hypothyroidism, unspecified type Most recent TSH at goal range, continue Levothyroxine 50 cmcg daily.   10. Macular degeneration (senile) of retina Continued vision decline.     Family/ staff Communication: nursing  Labs/tests ordered:  Vitamin D, orthostatic BP

## 2023-07-22 ENCOUNTER — Encounter: Payer: Self-pay | Admitting: Student

## 2023-07-23 LAB — VITAMIN D 25 HYDROXY (VIT D DEFICIENCY, FRACTURES): Vit D, 25-Hydroxy: <8

## 2023-07-23 LAB — BASIC METABOLIC PANEL
BUN: 18 (ref 4–21)
CO2: 28 — AB (ref 13–22)
Chloride: 100 (ref 99–108)
Creatinine: 1.5 — AB (ref 0.5–1.1)
Glucose: 88
Potassium: 4.1 meq/L (ref 3.5–5.1)
Sodium: 137 (ref 137–147)

## 2023-07-23 LAB — CBC AND DIFFERENTIAL
HCT: 33 — AB (ref 36–46)
Hemoglobin: 11.1 — AB (ref 12.0–16.0)
Platelets: 264 10*3/uL (ref 150–400)
WBC: 6.2

## 2023-07-23 LAB — COMPREHENSIVE METABOLIC PANEL
Calcium: 9.5 (ref 8.7–10.7)
eGFR: 33

## 2023-07-23 LAB — CBC: RBC: 3.45 — AB (ref 3.87–5.11)

## 2023-09-03 ENCOUNTER — Telehealth: Payer: Self-pay | Admitting: Student

## 2023-09-03 NOTE — Telephone Encounter (Signed)
Pt called stating Dr. Darrick Huntsman called her and she was returning her call

## 2023-09-03 NOTE — Telephone Encounter (Signed)
Did you call pt?

## 2023-09-04 NOTE — Telephone Encounter (Signed)
noted 

## 2023-09-06 LAB — COMPREHENSIVE METABOLIC PANEL: Calcium: 9.4 (ref 8.7–10.7)

## 2023-09-06 LAB — HEPATIC FUNCTION PANEL: Alkaline Phosphatase: 61 (ref 25–125)

## 2023-09-06 LAB — VITAMIN D 25 HYDROXY (VIT D DEFICIENCY, FRACTURES): Vit D, 25-Hydroxy: 12

## 2023-09-18 ENCOUNTER — Non-Acute Institutional Stay (SKILLED_NURSING_FACILITY): Payer: Self-pay | Admitting: Nurse Practitioner

## 2023-09-18 ENCOUNTER — Encounter: Payer: Self-pay | Admitting: Nurse Practitioner

## 2023-09-18 DIAGNOSIS — E559 Vitamin D deficiency, unspecified: Secondary | ICD-10-CM

## 2023-09-18 DIAGNOSIS — N2581 Secondary hyperparathyroidism of renal origin: Secondary | ICD-10-CM

## 2023-09-18 DIAGNOSIS — N184 Chronic kidney disease, stage 4 (severe): Secondary | ICD-10-CM | POA: Diagnosis not present

## 2023-09-18 DIAGNOSIS — I480 Paroxysmal atrial fibrillation: Secondary | ICD-10-CM | POA: Diagnosis not present

## 2023-09-18 DIAGNOSIS — F03B0 Unspecified dementia, moderate, without behavioral disturbance, psychotic disturbance, mood disturbance, and anxiety: Secondary | ICD-10-CM

## 2023-09-18 DIAGNOSIS — I10 Essential (primary) hypertension: Secondary | ICD-10-CM

## 2023-09-18 NOTE — Progress Notes (Signed)
Location:  Other [facility]  Nursing Home Room Number: [facility] 513A Place of Service:  SNF [phone]) Abbey Chatters, NP  PCP: Earnestine Mealing, MD  Patient Care Team: Earnestine Mealing, MD as PCP - General (Family Medicine) Debbe Odea, MD as PCP - Cardiology (Cardiology) Scarlett Presto, RN (Inactive) as Oncology Nurse Navigator Sharon Seller, NP as Nurse Practitioner (Geriatric Medicine)  Extended Emergency Contact Information Primary Emergency Contact: Bufkin,david Address: [address]          [place], [place] [address] [place] of [place] Home Phone: [phone] Mobile Phone: [phone] Relation: Son  Goals of care: Advanced Directive information    09/18/2023   10:19 AM  Advanced Directives  Does Patient Have a Medical Advance Directive? Yes  Type of Estate agent of [place];Out of facility DNR (pink MOST or yellow form)  Does patient want to make changes to medical advance directive? No - Patient declined  Copy of Healthcare Power of Attorney in Chart? Yes - validated most recent copy scanned in chart (See row information)     Chief Complaint  Patient presents with   Medical Management of Chronic Issues    Medical Management of Chronic Issues.     HPI:  Pt is a 89 y.o. female seen today for medical management of chronic disease.  Seen [facility] where is in for long term care.  PMH: HTN, A. Fib, CKD 4, dementia. Patient is just returning from lunch with her daughter.  Reports she started having issues with hearing aids yesterday, unable to troubleshoot, will have audiologist check them out.  No current concerns. Is eating good, staying hydrated. No abnormal pains/aches. Denies chest pain, shortness of breath.  No changes in mood. Patient reports she doesn't always pay attention to things thus she can't remember them, but daughter reports short term memory stable.   No concerns from nursing staff.  Past  Medical History:  Diagnosis Date   Breast cancer (HCC)    CKD (chronic kidney disease) stage 3, GFR 30-59 ml/min (HCC)    Displaced intertrochanteric fracture of right femur (HCC)    Per [facility]MR, [employer] Care   Former smoker    Heart disease    mitral valvular   Hyperlipidemia    Hyperparathyroidism, secondary (HCC)    Hypertension    Hypo-osmolality and hyponatremia    Per [facility] EMR, [employer] [facility]   Hypothyroidism    Macular degeneration    Per [employer] EMR, [employer] [facility]   Sensorineural hearing loss    Spinal stenosis of lumbar region 2006   s/p surgery   UTI (urinary tract infection)    Per [facility] EMR, [employer] Care   Vitamin D deficiency    Per [facility] EMR, [employer] Care   Past Surgical History:  Procedure Laterality Date   ABDOMINAL HYSTERECTOMY     bilateral cataract surgery     BREAST LUMPECTOMY Right 07/22/2021   CHOLECYSTECTOMY  12/04/1978   FRACTURE SURGERY Left 12/29/2018   FEMUR   INTRAMEDULLARY (IM) NAIL INTERTROCHANTERIC Right 09/21/2022   Procedure: INTRAMEDULLARY (IM) NAIL INTERTROCHANTERIC;  Surgeon: Juanell Fairly, MD;  Location: [facility] ORS;  Service: Orthopedics;  Laterality: Right;   JOINT REPLACEMENT     Bilateral knee   LAPAROSCOPIC SIGMOID COLECTOMY  12/04/2009   secondary to benign mass   MELANOMA EXCISION WITH SENTINEL LYMPH NODE BIOPSY Right 07/22/2021   Procedure: MELANOMA EXCISION WITH SENTINEL LYMPH  NODE BIOPSY;  Surgeon: Carolan Shiver, MD;  Location: [facility] ORS;  Service: General;  Laterality: Right;   PART MASTECTOMY,RADIO FREQUENCY LOCALIZER,AXILLARY SENTINEL NODE BIOPSY Right 07/22/2021   Procedure: PART MASTECTOMY,RADIO FREQUENCY LOCALIZER,AXILLARY SENTINEL NODE BIOPSY;  Surgeon: Carolan Shiver, MD;  Location: [facility] ORS;  Service: General;  Laterality: Right;   REPLACEMENT TOTAL KNEE BILATERAL     SMALL INTESTINE SURGERY  12/04/2008   for SBO   SPINE SURGERY  12/04/2004   [facility], [facility]    tendon repair     achille heall, left     Allergies  Allergen Reactions   Bactrim [Sulfamethoxazole-Trimethoprim] Nausea And Vomiting    Lethargic and weakness    Outpatient Encounter Medications as of 09/18/2023  Medication Sig   acetaminophen (TYLENOL) 500 MG tablet Take 1,000 mg by mouth every 8 (eight) hours as needed.   alum & mag hydroxide-simeth (MAALOX/MYLANTA) 200-200-20 MG/5ML suspension Take 30 mLs by mouth every 4 (four) hours as needed for indigestion.   apixaban (ELIQUIS) 2.5 MG TABS tablet Take 2.5 mg by mouth 2 (two) times daily.   bisacodyl (DULCOLAX) 10 MG suppository Place 1 suppository (10 mg total) rectally daily as needed for moderate constipation.   Calcium Carb-Cholecalciferol (RA CALCIUM PLUS VITAMIN D3) 600-10 MG-MCG TABS Take 1 tablet by mouth 2 (two) times daily.   diltiazem (CARDIZEM LA) 120 MG 24 hr tablet Take 120 mg by mouth daily.   levothyroxine (SYNTHROID) 50 MCG tablet TAKE 1 TABLET EVERY DAY ON EMPTY STOMACHWITH A GLASS OF WATER AT LEAST 30-60 MINBEFORE BREAKFAST   Magnesium Oxide 250 MG TABS Take 1 tablet by mouth every other day. At bedtime   Menthol-Methyl Salicylate (SALONPAS PAIN RELIEF PATCH EX) Apply to elbow topically to elbow topically every 6 hours as needed.   Multiple Vitamins-Minerals (PRESERVISION AREDS PO) Take 1 capsule by mouth 2 (two) times daily.   polyethylene glycol (MIRALAX / GLYCOLAX) 17 g packet Take 17 g by mouth daily as needed for mild constipation.   VAGINAL LUBRICANT VA Place 1 strip vaginally every 8 (eight) hours as needed.   Vitamin D, Ergocalciferol, (DRISDOL) 1.25 MG (50000 UNIT) CAPS capsule Take 1 capsule (50,000 Units total) by mouth every 7 (seven) days.   [DISCONTINUED] cholecalciferol (VITAMIN D3) 25 MCG (1000 UNIT) tablet Take 1,000 Units by mouth daily.   [DISCONTINUED] loperamide (IMODIUM) 1 MG/5ML solution Take 30 mLs by mouth every 4 (four) hours as needed for diarrhea or loose stools.   [DISCONTINUED]  ondansetron (ZOFRAN) 4 MG tablet Take 1 tablet (4 mg total) by mouth every 6 (six) hours as needed for nausea.   No facility-administered encounter medications on file as of 09/18/2023.    Review of Systems  Constitutional:  Negative for appetite change and unexpected weight change.  Respiratory:  Negative for cough and shortness of breath.   Cardiovascular:  Negative for chest pain, palpitations and leg swelling.  Gastrointestinal:  Negative for constipation, diarrhea, nausea and vomiting.  Genitourinary:  Negative for dysuria and urgency.  Musculoskeletal:  Negative for arthralgias and myalgias.  Neurological:  Negative for dizziness, weakness, light-headedness and headaches.  Psychiatric/Behavioral:  Positive for decreased concentration.      Immunization History  Administered Date(s) Administered   Influenza Split 10/03/2014, 10/04/2015   Influenza, High Dose Seasonal PF 10/04/2019, 09/21/2020, 09/20/2022   Influenza-Unspecified 09/03/2013, 10/01/2016, 10/06/2017, 09/18/2018   Moderna Covid-19 Fall Seasonal Vaccine 13yrs & older 03/13/2023   Moderna Covid-19 Vaccine Bivalent Booster 23yrs & up 04/21/2021   Moderna Sars-Covid-2  Vaccination 12/22/2019, 01/19/2020, 10/19/2020, 08/08/2021, 10/13/2022   PNEUMOCOCCAL CONJUGATE-20 10/04/2022   Pneumococcal Conjugate-13 10/26/2014   Pneumococcal Polysaccharide-23 04/04/1993, 09/20/2008, 01/28/2016   Tdap 02/14/2013, 07/11/2023   Unspecified SARS-COV-2 Vaccination 08/31/2023   Zoster Recombinant(Shingrix) 02/07/2017, 06/18/2017   Zoster, Live 12/08/2009   Pertinent  Health Maintenance Due  Topic Date Due   INFLUENZA VACCINE  07/05/2023   DEXA SCAN  Completed      09/25/2022   12:00 AM 09/25/2022    8:00 AM 09/27/2022    2:43 PM 03/09/2023    8:40 PM 07/10/2023    4:14 PM  Fall Risk  Falls in the past year?    1 1  Was there an injury with Fall?    1 1  Fall Risk Category Calculator    3 3  (RETIRED) Patient Fall Risk Level High  fall risk High fall risk High fall risk    Patient at Risk for Falls Due to    History of fall(s);Impaired balance/gait;Impaired mobility;Mental status change;Impaired vision History of fall(s);Impaired balance/gait;Impaired mobility  Fall risk Follow up    Falls evaluation completed    Functional Status Survey:    Vitals:   09/18/23 1013 09/18/23 1124  BP: (!) 142/84 122/71  Pulse: 79   Resp: 20   Temp: 97.6 F (36.4 C)   SpO2: 97%   Weight: 177 lb 9.6 oz (80.6 kg)   Height: 5\' 5"  (1.651 m)    Body mass index is 29.55 kg/m. Physical Exam Constitutional:      General: She is not in acute distress.    Appearance: Normal appearance. She is not ill-appearing.  Cardiovascular:     Rate and Rhythm: Normal rate. Rhythm irregular.     Pulses: Normal pulses.     Heart sounds: Normal heart sounds.  Pulmonary:     Effort: Pulmonary effort is normal.     Breath sounds: Normal breath sounds.  Abdominal:     General: Bowel sounds are normal.     Palpations: Abdomen is soft.  Musculoskeletal:     Right lower leg: No edema.     Left lower leg: No edema.  Skin:    General: Skin is warm and dry.  Neurological:     Mental Status: She is alert. Mental status is at baseline.  Psychiatric:        Mood and Affect: Mood normal.        Behavior: Behavior normal.     Labs reviewed: Recent Labs    09/21/22 2149 09/22/22 0314 09/24/22 0526 09/25/22 0304 09/27/22 1446 09/28/22 0000 01/04/23 0000 04/05/23 0000 07/23/23 0000 09/06/23 0000  NA  --    < > 132* 132* 135   < > 138 138 137  --   K  --    < > 4.2 4.0 3.8   < > 4.0 4.0 4.1  --   CL  --    < > 105 103 101   < > 103 104 100  --   CO2  --    < > 24 25 25    < > 28* 28* 28*  --   GLUCOSE  --    < > 112* 95 117*  --   --   --   --   --   BUN  --    < > 16 16 18    < > 17 29* 18  --   CREATININE  --    < > 1.26* 1.28* 1.34*   < >  1.2* 1.5* 1.5*  --   CALCIUM  --    < > 7.8* 7.7* 9.2   < > 9.2 9.5 9.5 9.4  MG 1.6*  --   --   --   2.0  --   --   --   --   --   PHOS 2.9  --   --   --   --   --   --   --   --   --    < > = values in this interval not displayed.   Recent Labs    09/27/22 1446 11/26/22 0000 01/04/23 0000 09/06/23 0000  AST 40 21 17  --   ALT 23 15 11   --   ALKPHOS 78 105 76 61  BILITOT 2.2*  --   --   --   PROT 6.8  --   --   --   ALBUMIN 3.3* 3.6 3.3*  --    Recent Labs    09/24/22 0526 09/25/22 0304 09/27/22 1446 09/28/22 0000 11/26/22 0000 01/04/23 0000 04/05/23 0000 07/23/23 0000  WBC 8.5 8.6 9.0   < > 9.0 6.0 6.0 6.2  NEUTROABS  --   --  6.1   < > 6,363.00 2,904.00 2,886.00  --   HGB 8.1* 7.6* 9.0*   < > 12.2 10.7* 11.3* 11.1*  HCT 23.9* 22.3* 27.7*   < > 37 32* 33* 33*  MCV 92.3 94.1 96.5  --   --   --   --   --   PLT 187 198 337   < > 306 271 233 264   < > = values in this interval not displayed.   Lab Results  Component Value Date   TSH 3.52 01/04/2023   Lab Results  Component Value Date   HGBA1C 5.8 03/08/2020   Lab Results  Component Value Date   CHOL 213 (H) 08/17/2021   HDL 45 (L) 08/17/2021   LDLCALC 114 (H) 08/17/2021   LDLDIRECT 132.0 07/12/2017   TRIG 377 (H) 08/17/2021   CHOLHDL 4.7 08/17/2021    Significant Diagnostic Results in last 30 days:  No results found.  Assessment/Plan 1. Paroxysmal atrial fibrillation (HCC) -Rate controlled; not symptomatic -Continue diltiazem and apixaban -- no signs of bleeding  2. Hypertension, unspecified type -Controlled, at goal, <140/90, on diltiazem for A. Fib -Encouraged dietary modifications/DASH diet and physical activity as tolerated  3. CKD (chronic kidney disease), stage IV (HCC) -Chronic and stable; labs stable, creatinine at baseline -Encouraged proper hydration and nutrition -Avoid nephrotoxic medications  4. Secondary hyperparathyroidism of renal origin (HCC) -Labs stable, follow  5. Vitamin D deficiency -Continue supplementation  6. Moderate dementia without behavioral disturbance, psychotic  disturbance, mood disturbance, or anxiety, unspecified dementia type (HCC) -Stable; no acute changes in mood, no decline in memory/cognitive or functional status -Continue supportive care and nursing assistance   Rollen Sox, FNP-MSN Student I personally was present during the history, physical exam and medical decision-making activities of this service and have verified that the service and findings are accurately documented in the student's note Jonay Hitchcock K. Biagio Borg [facility] [phone]

## 2023-11-08 ENCOUNTER — Encounter: Payer: Self-pay | Admitting: Nurse Practitioner

## 2023-11-08 ENCOUNTER — Non-Acute Institutional Stay (SKILLED_NURSING_FACILITY): Payer: Medicare HMO | Admitting: Nurse Practitioner

## 2023-11-08 DIAGNOSIS — D649 Anemia, unspecified: Secondary | ICD-10-CM

## 2023-11-08 DIAGNOSIS — E559 Vitamin D deficiency, unspecified: Secondary | ICD-10-CM

## 2023-11-08 DIAGNOSIS — I480 Paroxysmal atrial fibrillation: Secondary | ICD-10-CM

## 2023-11-08 DIAGNOSIS — D6869 Other thrombophilia: Secondary | ICD-10-CM | POA: Diagnosis not present

## 2023-11-08 DIAGNOSIS — I10 Essential (primary) hypertension: Secondary | ICD-10-CM | POA: Diagnosis not present

## 2023-11-08 DIAGNOSIS — N184 Chronic kidney disease, stage 4 (severe): Secondary | ICD-10-CM

## 2023-11-08 DIAGNOSIS — E039 Hypothyroidism, unspecified: Secondary | ICD-10-CM

## 2023-11-08 DIAGNOSIS — N2581 Secondary hyperparathyroidism of renal origin: Secondary | ICD-10-CM

## 2023-11-08 DIAGNOSIS — F03B0 Unspecified dementia, moderate, without behavioral disturbance, psychotic disturbance, mood disturbance, and anxiety: Secondary | ICD-10-CM

## 2023-11-08 NOTE — Progress Notes (Signed)
Location:  Other [facility].  Nursing Home Room Number: [facility] [address] Place of Service:  SNF [phone]) Abbey Chatters, NP  PCP: Earnestine Mealing, MD  Patient Care Team: Earnestine Mealing, MD as PCP - General (Family Medicine) Debbe Odea, MD as PCP - Cardiology (Cardiology) Scarlett Presto, RN (Inactive) as Oncology Nurse Navigator Sharon Seller, NP as Nurse Practitioner (Geriatric Medicine)  Extended Emergency Contact Information Primary Emergency Contact: Giangrande,david Address: [address]          [place], [place] [address] Darden Amber of [place] Home Phone: [phone] Mobile Phone: [phone] Relation: Son  Goals of care: Advanced Directive information    11/08/2023   12:35 PM  Advanced Directives  Does Patient Have a Medical Advance Directive? Yes  Type of Estate agent of [employer];Out of facility DNR (pink MOST or yellow form)  Does patient want to make changes to medical advance directive? No - Patient declined  Copy of Healthcare Power of Attorney in Chart? Yes - validated most recent copy scanned in chart (See row information)     Chief Complaint  Patient presents with   Medical Management of Chronic Issues    Medical Management of Chronic Issues.    HPI:  Pt is a 89 y.o. female seen today for medical management of chronic disease.  Pt with hx of a fib, ckd, hyperlipidemia, hypothyroid, OA Pt reports she is doing well.  Occasionally have a minor pain that tylenol improves.  Reports no anxiety or depression.  She has been eating well.  Staff reports she has been doing well and has no acute concerns.  No abnormal bruising or bleeding.    Past Medical History:  Diagnosis Date   Breast cancer (HCC)    CKD (chronic kidney disease) stage 3, GFR 30-59 ml/min (HCC)    Displaced intertrochanteric fracture of right femur (HCC)    Per [facility] EMR, [employer] Care   Former smoker    Heart disease    mitral valvular    Hyperlipidemia    Hyperparathyroidism, secondary (HCC)    Hypertension    Hypo-osmolality and hyponatremia    Per [facility] EMR, [employer] Care   Hypothyroidism    Macular degeneration    Per [employer] EMR, [employer] Care   Sensorineural hearing loss    Spinal stenosis of lumbar region 2006   s/p surgery   UTI (urinary tract infection)    Per [facility] EMR, [employer] Care   Vitamin D deficiency    Per [facility] EMR, [employer] Care   Past Surgical History:  Procedure Laterality Date   ABDOMINAL HYSTERECTOMY     bilateral cataract surgery     BREAST LUMPECTOMY Right 07/22/2021   CHOLECYSTECTOMY  12/04/1978   FRACTURE SURGERY Left 12/29/2018   FEMUR   INTRAMEDULLARY (IM) NAIL INTERTROCHANTERIC Right 09/21/2022   Procedure: INTRAMEDULLARY (IM) NAIL INTERTROCHANTERIC;  Surgeon: Juanell Fairly, MD;  Location: [facility] ORS;  Service: Orthopedics;  Laterality: Right;   JOINT REPLACEMENT     Bilateral knee   LAPAROSCOPIC SIGMOID COLECTOMY  12/04/2009   secondary to benign mass   MELANOMA EXCISION WITH SENTINEL LYMPH NODE BIOPSY Right 07/22/2021   Procedure: MELANOMA EXCISION WITH SENTINEL LYMPH NODE BIOPSY;  Surgeon: Carolan Shiver, MD;  Location: [facility] ORS;  Service: General;  Laterality: Right;   PART MASTECTOMY,RADIO FREQUENCY LOCALIZER,AXILLARY SENTINEL NODE BIOPSY Right 07/22/2021   Procedure: PART MASTECTOMY,RADIO FREQUENCY LOCALIZER,AXILLARY SENTINEL NODE BIOPSY;  Surgeon: Carolan Shiver, MD;  Location: [facility] ORS;  Service: General;  Laterality: Right;   REPLACEMENT TOTAL KNEE BILATERAL     SMALL INTESTINE SURGERY  12/04/2008   for SBO   SPINE SURGERY  12/04/2004   rods, [facility]   tendon repair     achille heall, left     Allergies  Allergen Reactions   Bactrim [Sulfamethoxazole-Trimethoprim] Nausea And Vomiting    Lethargic and weakness    Outpatient Encounter Medications as of 11/08/2023  Medication Sig   acetaminophen (TYLENOL) 500 MG tablet  Take 1,000 mg by mouth every 8 (eight) hours as needed.   alum & mag hydroxide-simeth (MAALOX/MYLANTA) 200-200-20 MG/5ML suspension Take 30 mLs by mouth every 4 (four) hours as needed for indigestion.   apixaban (ELIQUIS) 2.5 MG TABS tablet Take 2.5 mg by mouth 2 (two) times daily.   bisacodyl (DULCOLAX) 10 MG suppository Place 1 suppository (10 mg total) rectally daily as needed for moderate constipation.   Calcium Carb-Cholecalciferol (RA CALCIUM PLUS VITAMIN D3) 600-10 MG-MCG TABS Take 1 tablet by mouth 2 (two) times daily.   Cholecalciferol 25 MCG (1000 UT) TBDP Take 1 tablet by mouth daily.   diltiazem (CARDIZEM LA) 120 MG 24 hr tablet Take 120 mg by mouth daily.   levothyroxine (SYNTHROID) 50 MCG tablet TAKE 1 TABLET EVERY DAY ON EMPTY STOMACHWITH A GLASS OF WATER AT LEAST 30-60 MINBEFORE BREAKFAST   Magnesium Oxide 250 MG TABS Take 1 tablet by mouth every other day. At bedtime   Menthol-Methyl Salicylate (SALONPAS PAIN RELIEF PATCH EX) Apply to elbow topically to elbow topically every 6 hours as needed.   Multiple Vitamins-Minerals (PRESERVISION AREDS PO) Take 1 capsule by mouth 2 (two) times daily.   polyethylene glycol (MIRALAX / GLYCOLAX) 17 g packet Take 17 g by mouth daily as needed for mild constipation.   VAGINAL LUBRICANT VA Place 1 strip vaginally every 8 (eight) hours as needed.   [DISCONTINUED] Vitamin D, Ergocalciferol, (DRISDOL) 1.25 MG (50000 UNIT) CAPS capsule Take 1 capsule (50,000 Units total) by mouth every 7 (seven) days.   No facility-administered encounter medications on file as of 11/08/2023.    Review of Systems  Constitutional:  Negative for activity change, appetite change, fatigue and unexpected weight change.  HENT:  Negative for congestion and hearing loss.   Eyes: Negative.   Respiratory:  Negative for cough and shortness of breath.   Cardiovascular:  Negative for chest pain, palpitations and leg swelling.  Gastrointestinal:  Negative for abdominal pain,  constipation and diarrhea.  Genitourinary:  Negative for difficulty urinating and dysuria.  Musculoskeletal:  Negative for arthralgias and myalgias.  Skin:  Negative for color change and wound.  Neurological:  Negative for dizziness and weakness.  Psychiatric/Behavioral:  Negative for agitation, behavioral problems and confusion.      Immunization History  Administered Date(s) Administered   Influenza Split 10/03/2014, 10/04/2015   Influenza, High Dose Seasonal PF 10/04/2019, 09/21/2020, 09/20/2022   Influenza-Unspecified 09/03/2013, 10/01/2016, 10/06/2017, 09/18/2018, 09/26/2023   Moderna Covid-19 Fall Seasonal Vaccine 59yrs & older 03/13/2023   Moderna Covid-19 Vaccine Bivalent Booster 2yrs & up 04/21/2021   Moderna Sars-Covid-2 Vaccination 12/22/2019, 01/19/2020, 10/19/2020, 08/08/2021, 10/13/2022   PNEUMOCOCCAL CONJUGATE-20 10/04/2022   Pneumococcal Conjugate-13 10/26/2014   Pneumococcal Polysaccharide-23 04/04/1993, 09/20/2008, 01/28/2016   Tdap 02/14/2013, 07/11/2023   Unspecified SARS-COV-2 Vaccination 08/31/2023   Zoster Recombinant(Shingrix) 02/07/2017, 06/18/2017   Zoster, Live 12/08/2009   Pertinent  Health Maintenance Due  Topic Date Due   INFLUENZA VACCINE  Completed   DEXA  SCAN  Completed      09/25/2022   12:00 AM 09/25/2022    8:00 AM 09/27/2022    2:43 PM 03/09/2023    8:40 PM 07/10/2023    4:14 PM  Fall Risk  Falls in the past year?    1 1  Was there an injury with Fall?    1 1  Fall Risk Category Calculator    3 3  (RETIRED) Patient Fall Risk Level High fall risk High fall risk High fall risk    Patient at Risk for Falls Due to    History of fall(s);Impaired balance/gait;Impaired mobility;Mental status change;Impaired vision History of fall(s);Impaired balance/gait;Impaired mobility  Fall risk Follow up    Falls evaluation completed    Functional Status Survey:    Vitals:   11/08/23 1227  BP: 129/78  Pulse: 71  Resp: 16  Temp: 98.2 F (36.8 C)   SpO2: 96%  Weight: 185 lb 3.2 oz (84 kg)  Height: 5\' 5"  (1.651 m)   Body mass index is 30.82 kg/m. Physical Exam Constitutional:      General: She is not in acute distress.    Appearance: She is well-developed. She is not diaphoretic.  HENT:     Head: Normocephalic and atraumatic.     Mouth/Throat:     Pharynx: No oropharyngeal exudate.  Eyes:     Conjunctiva/sclera: Conjunctivae normal.     Pupils: Pupils are equal, round, and reactive to light.  Cardiovascular:     Rate and Rhythm: Normal rate and regular rhythm.     Heart sounds: Normal heart sounds.  Pulmonary:     Effort: Pulmonary effort is normal.     Breath sounds: Normal breath sounds.  Abdominal:     General: Bowel sounds are normal.     Palpations: Abdomen is soft.  Musculoskeletal:     Cervical back: Normal range of motion and neck supple.     Right lower leg: No edema.     Left lower leg: No edema.  Skin:    General: Skin is warm and dry.  Neurological:     Mental Status: She is alert. Mental status is at baseline.     Gait: Gait normal.  Psychiatric:        Mood and Affect: Mood normal.     Labs reviewed: Recent Labs    01/04/23 0000 04/05/23 0000 07/23/23 0000 09/06/23 0000  NA 138 138 137  --   K 4.0 4.0 4.1  --   CL 103 104 100  --   CO2 28* 28* 28*  --   BUN 17 29* 18  --   CREATININE 1.2* 1.5* 1.5*  --   CALCIUM 9.2 9.5 9.5 9.4   Recent Labs    11/26/22 0000 01/04/23 0000 09/06/23 0000  AST 21 17  --   ALT 15 11  --   ALKPHOS 105 76 61  ALBUMIN 3.6 3.3*  --    Recent Labs    11/26/22 0000 01/04/23 0000 04/05/23 0000 07/23/23 0000  WBC 9.0 6.0 6.0 6.2  NEUTROABS 6,363.00 2,904.00 2,886.00  --   HGB 12.2 10.7* 11.3* 11.1*  HCT 37 32* 33* 33*  PLT 306 271 233 264   Lab Results  Component Value Date   TSH 3.52 01/04/2023   Lab Results  Component Value Date   HGBA1C 5.8 03/08/2020   Lab Results  Component Value Date   CHOL 213 (H) 08/17/2021   HDL 45 (L)  08/17/2021  LDLCALC 114 (H) 08/17/2021   LDLDIRECT 132.0 07/12/2017   TRIG 377 (H) 08/17/2021   CHOLHDL 4.7 08/17/2021    Significant Diagnostic Results in last 30 days:  No results found.  Assessment/Plan 1. Paroxysmal atrial fibrillation (HCC) Rate controlled on cardizem  2. Acquired thrombophilia (HCC) Due to a fib, continues on eliquis BID  3. Hypertension, unspecified type -Blood pressure well controlled, goal bp <140/90 Continue current medications and dietary modifications follow metabolic panel  4. CKD (chronic kidney disease), stage IV (HCC) -Chronic and stable Encourage proper hydration Follow metabolic panel Avoid nephrotoxic meds (NSAIDS)  5. Secondary hyperparathyroidism of renal origin (HCC) Labs stable, continue to monitor.   6. Vitamin D deficiency -continue on supplement   7. Moderate dementia without behavioral disturbance, psychotic disturbance, mood disturbance, or anxiety, unspecified dementia type (HCC) -Stable, no acute changes in cognitive or functional status, continue supportive care.   8. Hypothyroidism, unspecified type TSH at goal in feb, continues synthroid 50 mcg  9. Anemia, unspecified type -stable on last labs, will continue to follow    Thaddeaus Monica K. Biagio Borg [facility] [phone]

## 2024-01-09 ENCOUNTER — Encounter: Payer: Self-pay | Admitting: Student

## 2024-01-09 NOTE — Progress Notes (Signed)
 A user error has taken place: encounter opened in error, closed for administrative reasons.

## 2024-01-12 ENCOUNTER — Encounter: Payer: Self-pay | Admitting: Student

## 2024-01-24 LAB — BASIC METABOLIC PANEL
BUN: 17 (ref 4–21)
CO2: 28 — AB (ref 13–22)
Chloride: 101 (ref 99–108)
Creatinine: 1.3 — AB (ref 0.5–1.1)
Glucose: 78
Potassium: 4.4 meq/L (ref 3.5–5.1)
Sodium: 137 (ref 137–147)

## 2024-01-24 LAB — CBC AND DIFFERENTIAL
HCT: 33 — AB (ref 36–46)
Hemoglobin: 11.2 — AB (ref 12.0–16.0)
Platelets: 248 10*3/uL (ref 150–400)
WBC: 7.2

## 2024-01-24 LAB — VITAMIN D 25 HYDROXY (VIT D DEFICIENCY, FRACTURES): Vit D, 25-Hydroxy: 11

## 2024-01-24 LAB — TSH: TSH: 5.12 (ref 0.41–5.90)

## 2024-01-24 LAB — COMPREHENSIVE METABOLIC PANEL
Calcium: 9.3 (ref 8.7–10.7)
eGFR: 39

## 2024-01-24 LAB — CBC: RBC: 3.47 — AB (ref 3.87–5.11)

## 2024-02-01 ENCOUNTER — Encounter: Payer: Self-pay | Admitting: Student

## 2024-02-01 ENCOUNTER — Non-Acute Institutional Stay (SKILLED_NURSING_FACILITY): Payer: Self-pay | Admitting: Student

## 2024-02-01 DIAGNOSIS — E039 Hypothyroidism, unspecified: Secondary | ICD-10-CM

## 2024-02-01 DIAGNOSIS — S72144D Nondisplaced intertrochanteric fracture of right femur, subsequent encounter for closed fracture with routine healing: Secondary | ICD-10-CM

## 2024-02-01 DIAGNOSIS — Z170 Estrogen receptor positive status [ER+]: Secondary | ICD-10-CM

## 2024-02-01 DIAGNOSIS — I480 Paroxysmal atrial fibrillation: Secondary | ICD-10-CM | POA: Diagnosis not present

## 2024-02-01 DIAGNOSIS — C50411 Malignant neoplasm of upper-outer quadrant of right female breast: Secondary | ICD-10-CM

## 2024-02-01 DIAGNOSIS — N184 Chronic kidney disease, stage 4 (severe): Secondary | ICD-10-CM

## 2024-02-01 DIAGNOSIS — F03B0 Unspecified dementia, moderate, without behavioral disturbance, psychotic disturbance, mood disturbance, and anxiety: Secondary | ICD-10-CM

## 2024-02-01 DIAGNOSIS — N2581 Secondary hyperparathyroidism of renal origin: Secondary | ICD-10-CM

## 2024-02-01 DIAGNOSIS — I10 Essential (primary) hypertension: Secondary | ICD-10-CM

## 2024-02-01 DIAGNOSIS — E559 Vitamin D deficiency, unspecified: Secondary | ICD-10-CM

## 2024-02-01 NOTE — Progress Notes (Signed)
 Location:  Other [facility].  Nursing Home Room Number: [facility] [address] Place of Service:  SNF [phone]) Provider:  Earnestine Mealing, MD  Patient Care Team: Earnestine Mealing, MD as PCP - General (Family Medicine) Debbe Odea, MD as PCP - Cardiology (Cardiology) Scarlett Presto, RN (Inactive) as Oncology Nurse Navigator Sharon Seller, NP as Nurse Practitioner (Geriatric Medicine)  Extended Emergency Contact Information Primary Emergency Contact: Matousek,david Address: [address]          [place], [place] [address] [place] of [place] Home Phone: [phone] Mobile Phone: [phone] Relation: Son  Code Status:  DNR Goals of care: Advanced Directive information    01/09/2024    9:59 AM  Advanced Directives  Does Patient Have a Medical Advance Directive? Yes  Type of Estate agent of [place];Out of facility DNR (pink MOST or yellow form)  Does patient want to make changes to medical advance directive? No - Patient declined  Copy of Healthcare Power of Attorney in Chart? Yes - validated most recent copy scanned in chart (See row information)     Chief Complaint  Patient presents with   Medical Management of Chronic Issues    Medical Management of Chronic Issues.     HPI:  Pt is a 89 y.o. female seen today for medical management of chronic diseases.  She experienced a strange sensation before waking up this morning, describing it as a 'boing' or buzzing feeling, similar to being jabbed quickly. It was brief and did not cause pain. No associated symptoms such as chest pain, palpitations, or weakness in extremities were reported.  She states her blood pressure was slightly elevated at 130, compared to her usual 120, but the cause is unclear.  She is currently on medication for thyroid and a blood thinner for atrial fibrillation. She also takes a vitamin D supplement due to previously low levels.  She uses a wheelchair more often but uses a walker  at the gym with assistance. She is able to transfer herself and manage bathroom needs independently, although she notes some difficulty pulling up her pants with one hand.  No signs of bleeding in gums, urine, or stool. She mentions her father was a Education officer, community and took good care of her teeth.  Past Medical History - History of high blood pressure - History of atrial fibrillation - History of low vitamin D level - History of pressure wound on bottom  Medications - Thyroid medication - Blood thinner for atrial fibrillation - Miralax daily  Physical Exam CHEST: Lungs clear to auscultation bilaterally. CARDIOVASCULAR: Heart rate normal. EXTREMITIES: No edema in lower extremities.  Assessment and Plan  Past Medical History:  Diagnosis Date   Breast cancer (HCC)    CKD (chronic kidney disease) stage 3, GFR 30-59 ml/min (HCC)    Displaced intertrochanteric fracture of right femur (HCC)    Per [facility] EMR, [employer] Care   Former smoker    Heart disease    mitral valvular   Hyperlipidemia    Hyperparathyroidism, secondary (HCC)    Hypertension    Hypo-osmolality and hyponatremia    Per [employer] EMR, [employer] Care   Hypothyroidism    Macular degeneration    Per [employer] EMR, [employer] Care   Sensorineural hearing loss    Spinal stenosis of lumbar region 2006   s/p surgery   UTI (urinary tract infection)    Per [facility] EMR, [employer] [facility]   Vitamin D deficiency  Per [facility] EMR, [employer] Care   Past Surgical History:  Procedure Laterality Date   ABDOMINAL HYSTERECTOMY     bilateral cataract surgery     BREAST LUMPECTOMY Right 07/22/2021   CHOLECYSTECTOMY  12/04/1978   FRACTURE SURGERY Left 12/29/2018   FEMUR   INTRAMEDULLARY (IM) NAIL INTERTROCHANTERIC Right 09/21/2022   Procedure: INTRAMEDULLARY (IM) NAIL INTERTROCHANTERIC;  Surgeon: Juanell Fairly, MD;  Location: [facility] ORS;  Service: Orthopedics;  Laterality: Right;   JOINT REPLACEMENT      Bilateral knee   LAPAROSCOPIC SIGMOID COLECTOMY  12/04/2009   secondary to benign mass   MELANOMA EXCISION WITH SENTINEL LYMPH NODE BIOPSY Right 07/22/2021   Procedure: MELANOMA EXCISION WITH SENTINEL LYMPH NODE BIOPSY;  Surgeon: Carolan Shiver, MD;  Location: [facility] ORS;  Service: General;  Laterality: Right;   PART MASTECTOMY,RADIO FREQUENCY LOCALIZER,AXILLARY SENTINEL NODE BIOPSY Right 07/22/2021   Procedure: PART MASTECTOMY,RADIO FREQUENCY LOCALIZER,AXILLARY SENTINEL NODE BIOPSY;  Surgeon: Carolan Shiver, MD;  Location: [facility] ORS;  Service: General;  Laterality: Right;   REPLACEMENT TOTAL KNEE BILATERAL     SMALL INTESTINE SURGERY  12/04/2008   for SBO   SPINE SURGERY  12/04/2004   rods, [facility]   tendon repair     achille heall, left     Allergies  Allergen Reactions   Bactrim [Sulfamethoxazole-Trimethoprim] Nausea And Vomiting    Lethargic and weakness    Outpatient Encounter Medications as of 02/01/2024  Medication Sig   acetaminophen (TYLENOL) 500 MG tablet Take 1,000 mg by mouth every 8 (eight) hours as needed. Take Two tablets by mouth at bedtime.   alum & mag hydroxide-simeth (MAALOX/MYLANTA) 200-200-20 MG/5ML suspension Take 30 mLs by mouth every 4 (four) hours as needed for indigestion.   apixaban (ELIQUIS) 2.5 MG TABS tablet Take 2.5 mg by mouth 2 (two) times daily.   bisacodyl (DULCOLAX) 10 MG suppository Place 1 suppository (10 mg total) rectally daily as needed for moderate constipation.   Calcium Carb-Cholecalciferol (RA CALCIUM PLUS VITAMIN D3) 600-10 MG-MCG TABS Take 1 tablet by mouth 2 (two) times daily.   Cholecalciferol 25 MCG (1000 UT) TBDP Take 1 tablet by mouth daily.   diltiazem (CARDIZEM LA) 120 MG 24 hr tablet Take 120 mg by mouth daily.   levothyroxine (SYNTHROID) 50 MCG tablet TAKE 1 TABLET EVERY DAY ON EMPTY STOMACHWITH A GLASS OF WATER AT LEAST 30-60 MINBEFORE BREAKFAST   Magnesium Oxide 250 MG TABS Take 1 tablet by mouth every other day. At  bedtime   Menthol-Methyl Salicylate (SALONPAS PAIN RELIEF PATCH EX) Apply to elbow topically to elbow topically every 6 hours as needed.   Multiple Vitamins-Minerals (PRESERVISION AREDS PO) Take 1 capsule by mouth 2 (two) times daily.   nystatin powder Apply 1 Application topically 2 (two) times daily. As needed   polyethylene glycol (MIRALAX / GLYCOLAX) 17 g packet Take 17 g by mouth 2 (two) times daily as needed. One scoop by mouth in the morning every Monday,Wednesday and Friday.   VAGINAL LUBRICANT VA Place 1 strip vaginally every 8 (eight) hours as needed.   [DISCONTINUED] polyethylene glycol (MIRALAX / GLYCOLAX) 17 g packet Take 17 g by mouth daily as needed for mild constipation. (Patient taking differently: Take 17 g by mouth daily as needed for mild constipation. One scoop by mouth in the morning every Monday, Wednesday and Friday.)   No facility-administered encounter medications on file as of 02/01/2024.    Review of Systems  Immunization History  Administered Date(s) Administered   Influenza  Split 10/03/2014, 10/04/2015   Influenza, High Dose Seasonal PF 10/04/2019, 09/21/2020, 09/20/2022   Influenza-Unspecified 09/03/2013, 10/01/2016, 10/06/2017, 09/18/2018, 09/26/2023   Moderna Covid-19 Fall Seasonal Vaccine 66yrs & older 03/13/2023   Moderna Covid-19 Vaccine Bivalent Booster 29yrs & up 04/21/2021   Moderna Sars-Covid-2 Vaccination 12/22/2019, 01/19/2020, 10/19/2020, 08/08/2021, 10/13/2022   PNEUMOCOCCAL CONJUGATE-20 10/04/2022   Pneumococcal Conjugate-13 10/26/2014   Pneumococcal Polysaccharide-23 04/04/1993, 09/20/2008, 01/28/2016   Tdap 02/14/2013, 07/11/2023   Unspecified SARS-COV-2 Vaccination 08/31/2023   Zoster Recombinant(Shingrix) 02/07/2017, 06/18/2017   Zoster, Live 12/08/2009   Pertinent  Health Maintenance Due  Topic Date Due   INFLUENZA VACCINE  Completed   DEXA SCAN  Completed      09/25/2022   12:00 AM 09/25/2022    8:00 AM 09/27/2022    2:43 PM  03/09/2023    8:40 PM 07/10/2023    4:14 PM  Fall Risk  Falls in the past year?    1 1  Was there an injury with Fall?    1 1  Fall Risk Category Calculator    3 3  (RETIRED) Patient Fall Risk Level High fall risk High fall risk High fall risk    Patient at Risk for Falls Due to    History of fall(s);Impaired balance/gait;Impaired mobility;Mental status change;Impaired vision History of fall(s);Impaired balance/gait;Impaired mobility  Fall risk Follow up    Falls evaluation completed    Functional Status Survey:    Vitals:   02/01/24 0938  BP: 130/81  Pulse: 73  Resp: 20  Temp: 97.9 F (36.6 C)  SpO2: 98%  Weight: 188 lb 3.2 oz (85.4 kg)  Height: 5\' 5"  (1.651 m)   Body mass index is 31.32 kg/m. Physical Exam Constitutional:      Appearance: Normal appearance.  Cardiovascular:     Rate and Rhythm: Normal rate.     Pulses: Normal pulses.     Heart sounds: Normal heart sounds.  Pulmonary:     Effort: Pulmonary effort is normal.  Abdominal:     General: Abdomen is flat. Bowel sounds are normal.     Palpations: Abdomen is soft.  Musculoskeletal:        General: No swelling or tenderness.  Skin:    General: Skin is warm and dry.  Neurological:     Mental Status: She is alert and oriented to person, place, and time.     Gait: Gait normal.  Psychiatric:        Mood and Affect: Mood normal.     Labs reviewed: Recent Labs    04/05/23 0000 07/23/23 0000 09/06/23 0000 01/24/24 0000  NA 138 137  --  137  K 4.0 4.1  --  4.4  CL 104 100  --  101  CO2 28* 28*  --  28*  BUN 29* 18  --  17  CREATININE 1.5* 1.5*  --  1.3*  CALCIUM 9.5 9.5 9.4 9.3   Recent Labs    09/06/23 0000  ALKPHOS 61   Recent Labs    04/05/23 0000 07/23/23 0000 01/24/24 0000  WBC 6.0 6.2 7.2  NEUTROABS 2,886.00  --   --   HGB 11.3* 11.1* 11.2*  HCT 33* 33* 33*  PLT 233 264 248   Lab Results  Component Value Date   TSH 5.12 01/24/2024   Lab Results  Component Value Date   HGBA1C  5.8 03/08/2020   Lab Results  Component Value Date   CHOL 213 (H) 08/17/2021   HDL 45 (L)  08/17/2021   LDLCALC 114 (H) 08/17/2021   LDLDIRECT 132.0 07/12/2017   TRIG 377 (H) 08/17/2021   CHOLHDL 4.7 08/17/2021    Significant Diagnostic Results in last 30 days:  No results found.  Assessment/Plan Routine Visit Presents for a routine visit. No acute complaints. Reports a brief, non-painful buzzing sensation before waking up, but no associated chest pain, palpitations, or extremity weakness. Uses a wheelchair for mobility and a walker at the gym under supervision. Independent in activities of daily living, including transferring and toileting. - Continue current mobility aids and ensure safety to prevent falls - Encourage asking for help when needed to avoid falls  Atrial Fibrillation Atrial fibrillation managed with a blood thinner. No recent episodes of chest pain or palpitations. Heart rate is normal on examination. - Continue current blood thinner medication  Chronic Kidney Disease Chronic kidney disease with previous low vitamin D levels. Discussed potential decline in kidney function affecting vitamin D activation. - Check kidney function, PTH, calcium, and phosphorus levels on Monday  Vitamin D Deficiency Vitamin D deficiency managed with a supplement. Discussed dietary sources of vitamin D and the role of kidney function in vitamin D activation. - Check vitamin D level on Monday - Encourage consumption of yogurt as a dietary source of vitamin D  General Health Maintenance Discussed diet and mobility. No signs of bleeding in gums, urine, or stool. Slightly elevated blood pressure noted but not concerning. Emphasized maintaining a balanced diet and regular physical activity. - Monitor blood pressure - Continue daily Miralax for constipation  Follow-up - Perform lab work on Monday to check vitamin D, kidney function, PTH, calcium, and phosphorus levels.   Family/ staff  Communication: nursing  Labs/tests ordered:  BMP, CBC, Vitamin D, PTH, Phosphorus

## 2024-02-08 NOTE — Telephone Encounter (Signed)
Message routed to PCP Beamer, Victoria, MD  

## 2024-02-25 LAB — BASIC METABOLIC PANEL WITH GFR
BUN: 16 (ref 4–21)
CO2: 30 — AB (ref 13–22)
Chloride: 98 — AB (ref 99–108)
Creatinine: 1.3 — AB (ref 0.5–1.1)
Glucose: 77
Potassium: 4.2 meq/L (ref 3.5–5.1)
Sodium: 135 — AB (ref 137–147)

## 2024-02-25 LAB — CBC AND DIFFERENTIAL
HCT: 35 — AB (ref 36–46)
Hemoglobin: 11.6 — AB (ref 12.0–16.0)
Neutrophils Absolute: 4039
Platelets: 265 10*3/uL (ref 150–400)
WBC: 7

## 2024-02-25 LAB — CBC: RBC: 3.59 — AB (ref 3.87–5.11)

## 2024-02-25 LAB — IRON,TIBC AND FERRITIN PANEL
%SAT: 40
Iron: 99
TIBC: 248

## 2024-02-25 LAB — VITAMIN B12: Vitamin B-12: 319

## 2024-02-25 LAB — COMPREHENSIVE METABOLIC PANEL WITH GFR: Calcium: 9.5 (ref 8.7–10.7)

## 2024-03-04 ENCOUNTER — Encounter: Payer: Self-pay | Admitting: Nurse Practitioner

## 2024-03-04 ENCOUNTER — Non-Acute Institutional Stay: Payer: Self-pay | Admitting: Nurse Practitioner

## 2024-03-04 DIAGNOSIS — K5901 Slow transit constipation: Secondary | ICD-10-CM

## 2024-03-04 DIAGNOSIS — N184 Chronic kidney disease, stage 4 (severe): Secondary | ICD-10-CM | POA: Diagnosis not present

## 2024-03-04 DIAGNOSIS — D6869 Other thrombophilia: Secondary | ICD-10-CM | POA: Insufficient documentation

## 2024-03-04 DIAGNOSIS — D649 Anemia, unspecified: Secondary | ICD-10-CM | POA: Insufficient documentation

## 2024-03-04 DIAGNOSIS — I10 Essential (primary) hypertension: Secondary | ICD-10-CM | POA: Diagnosis not present

## 2024-03-04 DIAGNOSIS — F03B0 Unspecified dementia, moderate, without behavioral disturbance, psychotic disturbance, mood disturbance, and anxiety: Secondary | ICD-10-CM

## 2024-03-04 DIAGNOSIS — I480 Paroxysmal atrial fibrillation: Secondary | ICD-10-CM

## 2024-03-04 DIAGNOSIS — E039 Hypothyroidism, unspecified: Secondary | ICD-10-CM

## 2024-03-04 DIAGNOSIS — E559 Vitamin D deficiency, unspecified: Secondary | ICD-10-CM

## 2024-03-04 NOTE — Assessment & Plan Note (Signed)
 Stable, no acute changes in cognitive or functional status, continue supportive care.

## 2024-03-04 NOTE — Assessment & Plan Note (Signed)
 Blood pressure well controlled, goal bp <140/90 Continue current medications and dietary modifications follow metabolic panel

## 2024-03-04 NOTE — Assessment & Plan Note (Signed)
 TSH at goal on last labs, continues on synthroid 50 mcg daily

## 2024-03-04 NOTE — Progress Notes (Signed)
 Location:  Other [facility]) Nursing Home Room Number: 513 a Place of Service:  SNF (31)  Jonerik Sliker K. Janyth Contes, NP   Patient Care Team: Earnestine Mealing, MD as PCP - General (Family Medicine) Debbe Odea, MD as PCP - Cardiology (Cardiology) Scarlett Presto, RN (Inactive) as Oncology Nurse Navigator Janyth Contes, Janene Harvey, NP as Nurse Practitioner (Geriatric Medicine)  Extended Emergency Contact Information Primary Emergency Contact: Dock,david Address: [address]          [place], [place] [address] [place] of [place] Home Phone: [phone] Mobile Phone: [phone] Relation: Son  Goals of care: Advanced Directive information    01/09/2024    9:59 AM  Advanced Directives  Does Patient Have a Medical Advance Directive? Yes  Type of Estate agent of [place];Out of facility DNR (pink MOST or yellow form)  Does patient want to make changes to medical advance directive? No - Patient declined  Copy of Healthcare Power of Attorney in Chart? Yes - validated most recent copy scanned in chart (See row information)     Chief Complaint  Patient presents with   Medical Management of Chronic Issues    Routine visit     HPI:  Pt is a 89 y.o. female seen today for medical management of chronic disease. Pt with hx of dementia, a fib, breast cancer, CKD.  She reports she is doing well.  She has no complaints at this time.  Reports she is mostly inactive. Needs to increase activity.  States she eats well. No worsening constipation.  No N/V Weight has been stable.  Denies anxiety or depression.  No chest pains, shortness of breath or LE edema.   Nursing has no acute concerns   Past Medical History:  Diagnosis Date   Breast cancer (HCC)    CKD (chronic kidney disease) stage 3, GFR 30-59 ml/min (HCC)    Displaced intertrochanteric fracture of right femur (HCC)    Per [facility] EMR, [employer] Care   Former smoker    Heart disease    mitral valvular    Hyperlipidemia    Hyperparathyroidism, secondary (HCC)    Hypertension    Hypo-osmolality and hyponatremia    Per [facility] EMR, [employer] [facility]   Hypothyroidism    Macular degeneration    Per [facility] EMR, [employer] [facility]   Sensorineural hearing loss    Spinal stenosis of lumbar region 2006   s/p surgery   UTI (urinary tract infection)    Per [facility] EMR, [employer] Care   Vitamin D deficiency    Per [facility] EMR, [employer] Care   Past Surgical History:  Procedure Laterality Date   ABDOMINAL HYSTERECTOMY     bilateral cataract surgery     BREAST LUMPECTOMY Right 07/22/2021   CHOLECYSTECTOMY  12/04/1978   FRACTURE SURGERY Left 12/29/2018   FEMUR   INTRAMEDULLARY (IM) NAIL INTERTROCHANTERIC Right 09/21/2022   Procedure: INTRAMEDULLARY (IM) NAIL INTERTROCHANTERIC;  Surgeon: Juanell Fairly, MD;  Location: [facility] ORS;  Service: Orthopedics;  Laterality: Right;   JOINT REPLACEMENT     Bilateral knee   LAPAROSCOPIC SIGMOID COLECTOMY  12/04/2009   secondary to benign mass   MELANOMA EXCISION WITH SENTINEL LYMPH NODE BIOPSY Right 07/22/2021   Procedure: MELANOMA EXCISION WITH SENTINEL LYMPH NODE BIOPSY;  Surgeon: Carolan Shiver, MD;  Location: [facility] ORS;  Service: General;  Laterality: Right;   PART MASTECTOMY,RADIO FREQUENCY LOCALIZER,AXILLARY SENTINEL NODE BIOPSY Right 07/22/2021   Procedure: PART MASTECTOMY,RADIO FREQUENCY  LOCALIZER,AXILLARY SENTINEL NODE BIOPSY;  Surgeon: Carolan Shiver, MD;  Location: [facility] ORS;  Service: General;  Laterality: Right;   REPLACEMENT TOTAL KNEE BILATERAL     SMALL INTESTINE SURGERY  12/04/2008   for SBO   SPINE SURGERY  12/04/2004   [facility], [facility]   tendon repair     achille heall, left     Allergies  Allergen Reactions   Bactrim [Sulfamethoxazole-Trimethoprim] Nausea And Vomiting    Lethargic and weakness    Outpatient Encounter Medications as of 03/04/2024  Medication Sig   acetaminophen (TYLENOL) 500 MG tablet  Take 1,000 mg by mouth every 8 (eight) hours as needed. And Take Two tablets by mouth at bedtime scheduled   apixaban (ELIQUIS) 2.5 MG TABS tablet Take 2.5 mg by mouth 2 (two) times daily.   Calcium Carb-Cholecalciferol (RA CALCIUM PLUS VITAMIN D3) 600-10 MG-MCG TABS Take 1 tablet by mouth 2 (two) times daily.   Cholecalciferol (VITAMIN D3) 50 MCG (2000 UT) TABS Take 1 tablet by mouth daily.   diltiazem (CARDIZEM LA) 120 MG 24 hr tablet Take 120 mg by mouth daily.   levothyroxine (SYNTHROID) 50 MCG tablet TAKE 1 TABLET EVERY DAY ON EMPTY STOMACHWITH A GLASS OF WATER AT LEAST 30-60 MINBEFORE BREAKFAST   Magnesium Oxide 250 MG TABS Take 1 tablet by mouth every other day. At bedtime   Multiple Vitamins-Minerals (PRESERVISION AREDS PO) Take 1 capsule by mouth 2 (two) times daily.   nystatin powder Apply 1 Application topically 2 (two) times daily. As needed   polyethylene glycol (MIRALAX / GLYCOLAX) 17 g packet Take 17 g by mouth every Monday, Wednesday, and Friday.   Vitamin D, Ergocalciferol, (DRISDOL) 1.25 MG (50000 UNIT) CAPS capsule Take 50,000 Units by mouth every 7 (seven) days. Wednesday   VAGINAL LUBRICANT VA Place 1 strip vaginally every 8 (eight) hours as needed.   [DISCONTINUED] alum & mag hydroxide-simeth (MAALOX/MYLANTA) 200-200-20 MG/5ML suspension Take 30 mLs by mouth every 4 (four) hours as needed for indigestion. (Patient not taking: Reported on 03/04/2024)   [DISCONTINUED] bisacodyl (DULCOLAX) 10 MG suppository Place 1 suppository (10 mg total) rectally daily as needed for moderate constipation. (Patient not taking: Reported on 03/04/2024)   [DISCONTINUED] Cholecalciferol 25 MCG (1000 UT) TBDP Take 1 tablet by mouth daily. (Patient not taking: Reported on 03/04/2024)   [DISCONTINUED] Menthol-Methyl Salicylate (SALONPAS PAIN RELIEF PATCH EX) Apply to elbow topically to elbow topically every 6 hours as needed. (Patient not taking: Reported on 03/04/2024)   No facility-administered encounter  medications on file as of 03/04/2024.    Review of Systems  Constitutional:  Negative for activity change, appetite change, fatigue and unexpected weight change.  HENT:  Negative for congestion and hearing loss.   Eyes: Negative.   Respiratory:  Negative for cough and shortness of breath.   Cardiovascular:  Negative for chest pain, palpitations and leg swelling.  Gastrointestinal:  Negative for abdominal pain, constipation and diarrhea.  Genitourinary:  Negative for difficulty urinating and dysuria.  Musculoskeletal:  Negative for arthralgias and myalgias.  Skin:  Negative for color change and wound.  Neurological:  Positive for weakness. Negative for dizziness.  Psychiatric/Behavioral:  Positive for confusion. Negative for agitation and behavioral problems.      Immunization History  Administered Date(s) Administered   Influenza Split 10/03/2014, 10/04/2015   Influenza, High Dose Seasonal PF 10/04/2019, 09/21/2020, 09/20/2022   Influenza-Unspecified 09/03/2013, 10/01/2016, 10/06/2017, 09/18/2018, 09/26/2023   Moderna Covid-19 Fall Seasonal Vaccine 12yrs & older 03/13/2023   Kerry Kass  Vaccine Bivalent Booster 63yrs & up 04/21/2021   Moderna Sars-Covid-2 Vaccination 12/22/2019, 01/19/2020, 10/19/2020, 08/08/2021, 10/13/2022   PNEUMOCOCCAL CONJUGATE-20 10/04/2022   Pneumococcal Conjugate-13 10/26/2014   Pneumococcal Polysaccharide-23 04/04/1993, 09/20/2008, 01/28/2016   Tdap 02/14/2013, 07/11/2023   Unspecified SARS-COV-2 Vaccination 08/31/2023   Zoster Recombinant(Shingrix) 02/07/2017, 06/18/2017   Zoster, Live 12/08/2009   Pertinent  Health Maintenance Due  Topic Date Due   INFLUENZA VACCINE  07/04/2024   DEXA SCAN  Completed      09/25/2022   12:00 AM 09/25/2022    8:00 AM 09/27/2022    2:43 PM 03/09/2023    8:40 PM 07/10/2023    4:14 PM  Fall Risk  Falls in the past year?    1 1  Was there an injury with Fall?    1 1  Fall Risk Category Calculator    3 3  (RETIRED)  Patient Fall Risk Level High fall risk High fall risk High fall risk    Patient at Risk for Falls Due to    History of fall(s);Impaired balance/gait;Impaired mobility;Mental status change;Impaired vision History of fall(s);Impaired balance/gait;Impaired mobility  Fall risk Follow up    Falls evaluation completed    Functional Status Survey:    Vitals:   03/04/24 1205  BP: 122/78  Pulse: 64  Temp: (!) 96.9 F (36.1 C)  SpO2: 97%  Weight: 184 lb (83.5 kg)  Height: 5\' 5"  (1.651 m)   Body mass index is 30.62 kg/m. Physical Exam Constitutional:      General: She is not in acute distress.    Appearance: She is well-developed. She is not diaphoretic.  HENT:     Head: Normocephalic and atraumatic.     Mouth/Throat:     Pharynx: No oropharyngeal exudate.  Eyes:     Conjunctiva/sclera: Conjunctivae normal.     Pupils: Pupils are equal, round, and reactive to light.  Cardiovascular:     Rate and Rhythm: Normal rate and regular rhythm.     Heart sounds: Normal heart sounds.  Pulmonary:     Effort: Pulmonary effort is normal.     Breath sounds: Normal breath sounds.  Abdominal:     General: Bowel sounds are normal.     Palpations: Abdomen is soft.  Musculoskeletal:     Cervical back: Normal range of motion and neck supple.     Right lower leg: No edema.     Left lower leg: No edema.  Skin:    General: Skin is warm and dry.  Neurological:     Mental Status: She is alert.  Psychiatric:        Mood and Affect: Mood normal.     Labs reviewed: Recent Labs    07/23/23 0000 09/06/23 0000 01/24/24 0000 02/25/24 0000  NA 137  --  137 135*  K 4.1  --  4.4 4.2  CL 100  --  101 98*  CO2 28*  --  28* 30*  BUN 18  --  17 16  CREATININE 1.5*  --  1.3* 1.3*  CALCIUM 9.5 9.4 9.3 9.5   Recent Labs    09/06/23 0000  ALKPHOS 61   Recent Labs    04/05/23 0000 07/23/23 0000 01/24/24 0000 02/25/24 0000  WBC 6.0 6.2 7.2 7.0  NEUTROABS 2,886.00  --   --  4,039.00  HGB 11.3*  11.1* 11.2* 11.6*  HCT 33* 33* 33* 35*  PLT 233 264 248 265   Lab Results  Component Value Date   TSH  5.12 01/24/2024   Lab Results  Component Value Date   HGBA1C 5.8 03/08/2020   Lab Results  Component Value Date   CHOL 213 (H) 08/17/2021   HDL 45 (L) 08/17/2021   LDLCALC 114 (H) 08/17/2021   LDLDIRECT 132.0 07/12/2017   TRIG 377 (H) 08/17/2021   CHOLHDL 4.7 08/17/2021    Significant Diagnostic Results in last 30 days:  No results found.  Assessment/Plan Hypothyroidism TSH at goal on last labs, continues on synthroid 50 mcg daily   Hypertension Blood pressure well controlled, goal bp <140/90 Continue current medications and dietary modifications follow metabolic panel  CKD (chronic kidney disease), stage IV (HCC) Chronic and stable Encourage proper hydration Follow metabolic panel Avoid nephrotoxic meds (NSAIDS)  Vitamin D deficiency Placed on Vit D 50,000 units weekly for 12 weeks then to continue vit D 2000 units daily   Atrial fibrillation (HCC) Rate controlled on diltiazem. No chest pains or palpitations   Moderate dementia without behavioral disturbance, psychotic disturbance, mood disturbance, or anxiety, unspecified dementia type (HCC) Stable, no acute changes in cognitive or functional status, continue supportive care.   Acquired thrombophilia (HCC) Due to a fib, continues on eliquis.  Slow transit constipation Controlled on current regimen.   Anemia Stable on recent labs, will follow.      Janene Harvey. [facility] [phone]

## 2024-03-04 NOTE — Assessment & Plan Note (Signed)
 Stable on recent labs, will follow.

## 2024-03-04 NOTE — Assessment & Plan Note (Signed)
 Controlled on current regimen.

## 2024-03-04 NOTE — Assessment & Plan Note (Signed)
 Placed on Vit D 50,000 units weekly for 12 weeks then to continue vit D 2000 units daily

## 2024-03-04 NOTE — Assessment & Plan Note (Signed)
 Due to a fib, continues on eliquis.

## 2024-03-04 NOTE — Assessment & Plan Note (Signed)
 Chronic and stable Encourage proper hydration Follow metabolic panel Avoid nephrotoxic meds (NSAIDS)

## 2024-03-04 NOTE — Assessment & Plan Note (Signed)
 Rate controlled on diltiazem. No chest pains or palpitations

## 2024-04-07 LAB — COMPREHENSIVE METABOLIC PANEL WITH GFR
Albumin: 3.7 (ref 3.5–5.0)
Calcium: 9.5 (ref 8.7–10.7)
Globulin: 2.6
eGFR: 34

## 2024-04-07 LAB — CBC AND DIFFERENTIAL
HCT: 35 — AB (ref 36–46)
Hemoglobin: 11.9 — AB (ref 12.0–16.0)
Neutrophils Absolute: 5620
Platelets: 277 10*3/uL (ref 150–400)
WBC: 8.4

## 2024-04-07 LAB — HEPATIC FUNCTION PANEL
ALT: 11 U/L (ref 7–35)
AST: 16 (ref 13–35)
Alkaline Phosphatase: 60 (ref 25–125)
Bilirubin, Total: 0.5

## 2024-04-07 LAB — BASIC METABOLIC PANEL WITH GFR
BUN: 23 — AB (ref 4–21)
CO2: 28 — AB (ref 13–22)
Chloride: 99 (ref 99–108)
Creatinine: 1.4 — AB (ref 0.5–1.1)
Glucose: 75
Potassium: 3.9 meq/L (ref 3.5–5.1)
Sodium: 135 — AB (ref 137–147)

## 2024-04-07 LAB — TSH: TSH: 4.45 (ref 0.41–5.90)

## 2024-04-07 LAB — LIPID PANEL
Cholesterol: 180 (ref 0–200)
HDL: 48 (ref 35–70)
LDL Cholesterol: 102
Triglycerides: 180 — AB (ref 40–160)

## 2024-04-07 LAB — VITAMIN B12: Vitamin B-12: 745

## 2024-04-07 LAB — CBC: RBC: 3.64 — AB (ref 3.87–5.11)

## 2024-04-07 LAB — HEMOGLOBIN A1C: Hemoglobin A1C: 5.6

## 2024-05-09 ENCOUNTER — Non-Acute Institutional Stay (SKILLED_NURSING_FACILITY): Payer: Self-pay | Admitting: Student

## 2024-05-09 ENCOUNTER — Encounter: Payer: Self-pay | Admitting: Student

## 2024-05-09 DIAGNOSIS — E559 Vitamin D deficiency, unspecified: Secondary | ICD-10-CM | POA: Diagnosis not present

## 2024-05-09 DIAGNOSIS — I480 Paroxysmal atrial fibrillation: Secondary | ICD-10-CM

## 2024-05-09 NOTE — Progress Notes (Unsigned)
 Location:  Other [facility].  Nursing Home Room Number: [facility] 513A Place of Service:  SNF [phone]) Provider:  Valrie Gehrig, MD  Patient Care Team: Valrie Gehrig, MD as PCP - General (Family Medicine) Constancia Delton, MD as PCP - Cardiology (Cardiology) Arlette Benders, RN (Inactive) as Oncology Nurse Navigator Verma Gobble, NP as Nurse Practitioner (Geriatric Medicine)  Extended Emergency Contact Information Primary Emergency Contact: Herren,david Address: [address]          [place], [place] [address] [place] Home Phone: [phone] Mobile Phone: [phone] Relation: Son  Code Status:  DNR Goals of care: Advanced Directive information    01/09/2024    9:59 AM  Advanced Directives  Does Patient Have a Medical Advance Directive? Yes  Type of Estate agent of [place];Out of facility DNR (pink MOST or yellow form)  Does patient want to make changes to medical advance directive? No - Patient declined  Copy of Healthcare Power of Attorney in Chart? Yes - validated most recent copy scanned in chart (See row information)     Chief Complaint  Patient presents with  . Medical Management of Chronic Issues    Medical Management of Chronic Issues    HPI:  Pt is a 89 y.o. female seen today for medical management of chronic diseases.   History of Present Illness The patient is a 89 year old with atrial fibrillation and vitamin D  deficiency who presents for follow-up.  She feels well overall with no issues related to her atrial fibrillation. Her heart rate is well controlled, and there are no signs of bleeding. She experiences no shortness of breath, chest pain, or new swelling in her legs. She uses gym equipment and wears compression stockings.  She has a known vitamin D  deficiency and is on calcifediol , which was recently reordered due to a lapse in availability. Her last vitamin D  level was extremely low, and she is due for a  recheck this month. There was a recent issue with her not receiving her vitamin D  supplement as it was not included in her medication packs.  She has gained weight over the last six months, attributing it to decreased activity. She is now being walked twice a day, a change initiated by her son. Socially, she enjoys playing bingo and has been winning candy, which might contribute to her weight gain. She brushes her teeth regularly, a habit instilled by her father, who was a Education officer, community.  Social History - Plays bingo - Gets massages once a month - Exercises at the gym  Family History - Father was a dentis  Results LABS Vitamin D : significantly deficient  Assessment and Plan   Past Medical History:  Diagnosis Date  . Breast cancer (HCC)   . CKD (chronic kidney disease) stage 3, GFR 30-59 ml/min (HCC)   . Displaced intertrochanteric fracture of right femur [facility])    Per [facility] EMR, [employer] Care  . Former smoker   . Heart disease    mitral valvular  . Hyperlipidemia   . Hyperparathyroidism, secondary (HCC)   . Hypertension   . Hypo-osmolality and hyponatremia    Per [facility] EMR, [employer]  . Hypothyroidism   . Macular degeneration    Per [facility] EMR, [employer]  . Sensorineural hearing loss   . Spinal stenosis of lumbar region 2006   s/p surgery  . UTI (urinary tract infection)    Per [facility], [address]  [employer] [facility]  . Vitamin D  deficiency    Per [facility] EMR, [employer] [facility]   Past Surgical History:  Procedure Laterality Date  . ABDOMINAL HYSTERECTOMY    . bilateral cataract surgery    . BREAST LUMPECTOMY Right 07/22/2021  . CHOLECYSTECTOMY  12/04/1978  . FRACTURE SURGERY Left 12/29/2018   FEMUR  . INTRAMEDULLARY (IM) NAIL INTERTROCHANTERIC Right 09/21/2022   Procedure: INTRAMEDULLARY (IM) NAIL INTERTROCHANTERIC;  Surgeon: Rande Bushy, MD;  Location: [facility] ORS;  Service: Orthopedics;  Laterality: Right;  . JOINT REPLACEMENT      Bilateral knee  . LAPAROSCOPIC SIGMOID COLECTOMY  12/04/2009   secondary to benign mass  . MELANOMA EXCISION WITH SENTINEL LYMPH NODE BIOPSY Right 07/22/2021   Procedure: MELANOMA EXCISION WITH SENTINEL LYMPH NODE BIOPSY;  Surgeon: Eldred Grego, MD;  Location: [facility] ORS;  Service: General;  Laterality: Right;  . PART MASTECTOMY,RADIO FREQUENCY LOCALIZER,AXILLARY SENTINEL NODE BIOPSY Right 07/22/2021   Procedure: PART MASTECTOMY,RADIO FREQUENCY LOCALIZER,AXILLARY SENTINEL NODE BIOPSY;  Surgeon: Eldred Grego, MD;  Location: [facility] ORS;  Service: General;  Laterality: Right;  . REPLACEMENT TOTAL KNEE BILATERAL    . SMALL INTESTINE SURGERY  12/04/2008   for SBO  . SPINE SURGERY  12/04/2004   [facility], [facility]  . tendon repair     achille heall, left     Allergies  Allergen Reactions  . Bactrim  [Sulfamethoxazole -Trimethoprim ] Nausea And Vomiting    Lethargic and weakness    Outpatient Encounter Medications as of 05/09/2024  Medication Sig  . acetaminophen  (TYLENOL ) 500 MG tablet Take 1,000 mg by mouth every 8 (eight) hours as needed. And Take Two tablets by mouth at bedtime scheduled  . apixaban  (ELIQUIS ) 2.5 MG TABS tablet Take 2.5 mg by mouth 2 (two) times daily.  . Calcium Carb-Cholecalciferol (RA CALCIUM PLUS VITAMIN D3) 600-10 MG-MCG TABS Take 1 tablet by mouth 2 (two) times daily.  . Cholecalciferol (VITAMIN D3) 50 MCG (2000 UT) TABS Take 1 tablet by mouth daily.  . diltiazem  (CARDIZEM  LA) 120 MG 24 hr tablet Take 120 mg by mouth daily.  . levothyroxine  (SYNTHROID ) 50 MCG tablet TAKE 1 TABLET EVERY DAY ON EMPTY STOMACHWITH A GLASS OF WATER  AT LEAST 30-60 MINBEFORE BREAKFAST  . Magnesium  Oxide 250 MG TABS Take 1 tablet by mouth every other day. At bedtime  . Multiple Vitamins-Minerals (PRESERVISION AREDS PO) Take 1 capsule by mouth 2 (two) times daily.  Aaron Aas nystatin powder Apply 1 Application topically 2 (two) times daily. As needed  . polyethylene glycol (MIRALAX  / GLYCOLAX )  17 g packet Take 17 g by mouth every Monday, Wednesday, and Friday.  . Vitamin D , Ergocalciferol , (DRISDOL ) 1.25 MG (50000 UNIT) CAPS capsule Take 50,000 Units by mouth every 7 (seven) days. Wednesday  . VAGINAL LUBRICANT VA Place 1 strip vaginally every 8 (eight) hours as needed. (Patient not taking: Reported on 05/09/2024)   No facility-administered encounter medications on file as of 05/09/2024.    Review of Systems  Immunization History  Administered Date(s) Administered  . Influenza Split 10/03/2014, 10/04/2015  . Influenza, High Dose Seasonal PF 10/04/2019, 09/21/2020, 09/20/2022  . Influenza-Unspecified 09/03/2013, 10/01/2016, 10/06/2017, 09/18/2018, 09/26/2023  . Moderna Covid-19 Fall Seasonal Vaccine 47yrs & older 03/13/2023  . Moderna Covid-19 Vaccine Bivalent Booster 72yrs & up 04/21/2021  . Moderna Sars-Covid-2 Vaccination 12/22/2019, 01/19/2020, 10/19/2020, 08/08/2021, 10/13/2022  . PNEUMOCOCCAL CONJUGATE-20 10/04/2022  . Pneumococcal Conjugate-13 10/26/2014  . Pneumococcal Polysaccharide-23 04/04/1993, 09/20/2008, 01/28/2016  . Tdap 02/14/2013, 07/11/2023  . Unspecified SARS-COV-2 Vaccination 08/31/2023  .  Zoster Recombinant(Shingrix) 02/07/2017, 06/18/2017  . Zoster, Live 12/08/2009   Pertinent  Health Maintenance Due  Topic Date Due  . INFLUENZA VACCINE  07/04/2024  . DEXA SCAN  Completed      09/25/2022   12:00 AM 09/25/2022    8:00 AM 09/27/2022    2:43 PM 03/09/2023    8:40 PM 07/10/2023    4:14 PM  Fall Risk  Falls in the past year?    1 1  Was there an injury with Fall?    1 1  Fall Risk Category Calculator    3 3  (RETIRED) Patient Fall Risk Level High fall risk High fall risk High fall risk    Patient at Risk for Falls Due to    History of fall(s);Impaired balance/gait;Impaired mobility;Mental status change;Impaired vision History of fall(s);Impaired balance/gait;Impaired mobility  Fall risk Follow up    Falls evaluation completed    Functional Status  Survey:    Vitals:   05/09/24 1056  BP: 135/78  Pulse: 65  Resp: 18  Temp: (!) 97 F (36.1 C)  SpO2: 96%  Weight: 192 lb 12.8 oz (87.5 kg)  Height: 5\' 5"  (1.651 m)   Body mass index is 32.08 kg/m. Physical Exam  Labs reviewed: Recent Labs    01/24/24 0000 02/25/24 0000 04/07/24 0000  NA 137 135* 135*  K 4.4 4.2 3.9  CL 101 98* 99  CO2 28* 30* 28*  BUN 17 16 23*  CREATININE 1.3* 1.3* 1.4*  CALCIUM 9.3 9.5 9.5   Recent Labs    09/06/23 0000 04/07/24 0000  AST  --  16  ALT  --  11  ALKPHOS 61 60  ALBUMIN  --  3.7   Recent Labs    01/24/24 0000 02/25/24 0000 04/07/24 0000  WBC 7.2 7.0 8.4  NEUTROABS  --  4,039.00 5,620.00  HGB 11.2* 11.6* 11.9*  HCT 33* 35* 35*  PLT 248 265 277   Lab Results  Component Value Date   TSH 4.45 04/07/2024   Lab Results  Component Value Date   HGBA1C 5.6 04/07/2024   Lab Results  Component Value Date   CHOL 180 04/07/2024   HDL 48 04/07/2024   LDLCALC 102 04/07/2024   LDLDIRECT 132.0 07/12/2017   TRIG 180 (A) 04/07/2024   CHOLHDL 4.7 08/17/2021    Significant Diagnostic Results in last 30 days:  No results found.  Assessment/Plan Atrial fibrillation Atrial fibrillation is well-controlled with no signs of bleeding, dyspnea, chest pain, or peripheral edema. Recent weight gain noted without evidence of heart failure symptoms. - Check labs on Monday to assess for any cardiac issues related to weight gain.  Vitamin D  deficiency Vitamin D  deficiency with previously extremely low levels. Currently on calcifediol  supplement, but experiencing issues with pharmacy access. Vitamin D  levels require re-evaluation. - Reorder calcifediol  supplement. - Contact pharmacy to resolve access issues. - Order vitamin D  level test for next week.  Actinic Keratosis History of skin cancer with recent improvement. Skin shows improvement with previous lesions resolving.  Family/ staff Communication: ***  Labs/tests ordered:   ***

## 2024-05-11 ENCOUNTER — Encounter: Payer: Self-pay | Admitting: Student

## 2024-05-12 LAB — VITAMIN D 25 HYDROXY (VIT D DEFICIENCY, FRACTURES): Vit D, 25-Hydroxy: 77

## 2024-05-29 ENCOUNTER — Encounter: Payer: Self-pay | Admitting: Nurse Practitioner

## 2024-05-29 ENCOUNTER — Non-Acute Institutional Stay (SKILLED_NURSING_FACILITY): Payer: Self-pay | Admitting: Nurse Practitioner

## 2024-05-29 DIAGNOSIS — R609 Edema, unspecified: Secondary | ICD-10-CM

## 2024-05-29 NOTE — Progress Notes (Signed)
 Location:  Other [facility].  Nursing Home Room Number: [facility]. [address] [id number] Place of Service:  SNF [phone]) Harlene An, NP  PCP: Abdul Fine, MD  Patient Care Team: Abdul Fine, MD as PCP - General (Family Medicine) [name], MD as PCP - Cardiology (Cardiology) [name], RN (Inactive) as Oncology Nurse Navigator An Harlene POUR, NP as Nurse Practitioner (Geriatric Medicine)  Extended Emergency Contact Information Primary Emergency Contact: Bell,david Address: [address]          [place], [place] [address] [place] Home Phone: [phone] Mobile Phone: [phone] Relation: Son  Goals of care: Advanced Directive information    01/09/2024    9:59 AM  Advanced Directives  Does Patient Have a Medical Advance Directive? Yes  Type of Estate agent of [place];Out of facility DNR (pink MOST or yellow form)  Does patient want to make changes to medical advance directive? No - Patient declined  Copy of Healthcare Power of Attorney in Chart? Yes - validated most recent copy scanned in chart (See row information)     Chief Complaint  Patient presents with   Facial Swelling    Left Ear Swelling.     HPI:  Pt is a 89 y.o. female seen today for an acute visit for Left Ear Swelling. Staff noted area on face by left ear with swelling. Pt reports it does not bother her but tender to touch. Area has been there for 2-3 days. Redness and warm noted by staff. No fevers, chills.  No dental pain or trouble chewing or swallowing.  No nasal congestion or pain in the ear reported.    Past Medical History:  Diagnosis Date   Breast cancer (HCC)    CKD (chronic kidney disease) stage 3, GFR 30-59 ml/min (HCC)    Displaced intertrochanteric fracture of right femur (HCC)    Per [facility] EMR, [employer] Care   Former smoker    Heart disease    mitral valvular   Hyperlipidemia    Hyperparathyroidism, secondary (HCC)     Hypertension    Hypo-osmolality and hyponatremia    Per [facility] EMR, [employer] Care   Hypothyroidism    Macular degeneration    Per [facility] EMR, [employer] Care   Sensorineural hearing loss    Spinal stenosis of lumbar region 2006   s/p surgery   UTI (urinary tract infection)    Per [facility] EMR, [employer] Care   Vitamin D  deficiency    Per [facility] EMR, [employer] Care   Past Surgical History:  Procedure Laterality Date   ABDOMINAL HYSTERECTOMY     bilateral cataract surgery     BREAST LUMPECTOMY Right 07/22/2021   CHOLECYSTECTOMY  12/04/1978   FRACTURE SURGERY Left 12/29/2018   FEMUR   INTRAMEDULLARY (IM) NAIL INTERTROCHANTERIC Right 09/21/2022   Procedure: INTRAMEDULLARY (IM) NAIL INTERTROCHANTERIC;  Surgeon: Marchia Drivers, MD;  Location: [facility] ORS;  Service: Orthopedics;  Laterality: Right;   JOINT REPLACEMENT     Bilateral knee   LAPAROSCOPIC SIGMOID COLECTOMY  12/04/2009   secondary to benign mass   MELANOMA EXCISION WITH SENTINEL LYMPH NODE BIOPSY Right 07/22/2021   Procedure: MELANOMA EXCISION WITH SENTINEL LYMPH NODE BIOPSY;  Surgeon: Rodolph Romano, MD;  Location: [facility] ORS;  Service: General;  Laterality: Right;   PART MASTECTOMY,RADIO FREQUENCY LOCALIZER,AXILLARY SENTINEL NODE BIOPSY Right 07/22/2021   Procedure: PART MASTECTOMY,RADIO FREQUENCY LOCALIZER,AXILLARY SENTINEL NODE BIOPSY;  Surgeon: Rodolph Romano, MD;  Location: [facility] ORS;  Service: General;  Laterality: Right;   REPLACEMENT TOTAL KNEE BILATERAL     SMALL INTESTINE SURGERY  12/04/2008   for SBO   SPINE SURGERY  12/04/2004   rods, [facility]   tendon repair     achille heall, left     Allergies  Allergen Reactions   Bactrim  [Sulfamethoxazole -Trimethoprim ] Nausea And Vomiting    Lethargic and weakness    Outpatient Encounter Medications as of 05/29/2024  Medication Sig   acetaminophen  (TYLENOL ) 500 MG tablet Take 1,000 mg by mouth every 8 (eight) hours as needed.  And Take Two tablets by mouth at bedtime scheduled   amoxicillin-clavulanate (AUGMENTIN) 875-125 MG tablet Take 1 tablet by mouth 2 (two) times daily.   apixaban  (ELIQUIS ) 2.5 MG TABS tablet Take 2.5 mg by mouth 2 (two) times daily.   Calcium Carb-Cholecalciferol (RA CALCIUM PLUS VITAMIN D3) 600-10 MG-MCG TABS Take 1 tablet by mouth 2 (two) times daily.   Cholecalciferol (VITAMIN D3) 50 MCG (2000 UT) TABS Take 1 tablet by mouth daily.   cyanocobalamin (VITAMIN B12) 1000 MCG tablet Take 1,000 mcg by mouth daily.   diltiazem  (CARDIZEM  LA) 120 MG 24 hr tablet Take 120 mg by mouth daily.   levothyroxine  (SYNTHROID ) 50 MCG tablet TAKE 1 TABLET EVERY DAY ON EMPTY STOMACHWITH A GLASS OF WATER  AT LEAST 30-60 MINBEFORE BREAKFAST   Magnesium  Oxide 250 MG TABS Take 1 tablet by mouth every other day. At bedtime   Multiple Vitamins-Minerals (PRESERVISION AREDS PO) Take 1 capsule by mouth 2 (two) times daily.   nystatin powder Apply 1 Application topically 2 (two) times daily. As needed   polyethylene glycol (MIRALAX  / GLYCOLAX ) 17 g packet Take 17 g by mouth every Monday, Wednesday, and Friday.   Vitamin D , Ergocalciferol , (DRISDOL ) 1.25 MG (50000 UNIT) CAPS capsule Take 50,000 Units by mouth every 7 (seven) days. Wednesday (Patient not taking: Reported on 05/29/2024)   No facility-administered encounter medications on file as of 05/29/2024.    Review of Systems  Constitutional:  Negative for activity change, appetite change, fatigue and unexpected weight change.  HENT:  Positive for facial swelling (and tenderness noted). Negative for congestion, dental problem, drooling, ear discharge, ear pain, hearing loss, sinus pressure, sinus pain, sore throat, trouble swallowing and voice change.   Eyes: Negative.   Respiratory:  Negative for cough and shortness of breath.   Cardiovascular:  Negative for chest pain, palpitations and leg swelling.  Gastrointestinal:  Negative for abdominal pain, constipation and  diarrhea.  Skin:  Negative for color change and wound.  Neurological:  Negative for dizziness and weakness.  Psychiatric/Behavioral:  Negative for agitation, behavioral problems and confusion.     Immunization History  Administered Date(s) Administered   Influenza Split 10/03/2014, 10/04/2015   Influenza, High Dose Seasonal PF 10/04/2019, 09/21/2020, 09/20/2022   Influenza-Unspecified 09/03/2013, 10/01/2016, 10/06/2017, 09/18/2018, 09/26/2023   Moderna Covid-19 Fall Seasonal Vaccine 34yrs & older 03/13/2023   Moderna Covid-19 Vaccine Bivalent Booster 17yrs & up 04/21/2021   Moderna Sars-Covid-2 Vaccination 12/22/2019, 01/19/2020, 10/19/2020, 08/08/2021, 10/13/2022   PNEUMOCOCCAL CONJUGATE-20 10/04/2022   Pneumococcal Conjugate-13 10/26/2014   Pneumococcal Polysaccharide-23 04/04/1993, 09/20/2008, 01/28/2016   Tdap 02/14/2013, 07/11/2023   Unspecified SARS-COV-2 Vaccination 08/31/2023   Zoster Recombinant(Shingrix) 02/07/2017, 06/18/2017   Zoster, Live 12/08/2009   Pertinent  Health Maintenance Due  Topic Date Due   INFLUENZA VACCINE  07/04/2024   DEXA SCAN  Completed      09/25/2022   12:00 AM 09/25/2022  8:00 AM 09/27/2022    2:43 PM 03/09/2023    8:40 PM 07/10/2023    4:14 PM  Fall Risk  Falls in the past year?    1 1  Was there an injury with Fall?    1 1  Fall Risk Category Calculator    3 3  (RETIRED) Patient Fall Risk Level High fall risk  High fall risk  High fall risk     Patient at Risk for Falls Due to    History of fall(s);Impaired balance/gait;Impaired mobility;Mental status change;Impaired vision History of fall(s);Impaired balance/gait;Impaired mobility  Fall risk Follow up    Falls evaluation completed      Data saved with a previous flowsheet row definition   Functional Status Survey:    Vitals:   05/29/24 1224  BP: 131/75  Pulse: 66  Resp: 18  Temp: (!) 97 F (36.1 C)  SpO2: 96%  Weight: 192 lb 12.8 oz (87.5 kg)  Height: 5' 5 (1.651 m)   Body  mass index is 32.08 kg/m. Physical Exam Constitutional:      General: She is not in acute distress.    Appearance: She is well-developed. She is not diaphoretic.  HENT:     Head: Normocephalic and atraumatic.     Comments: Tenderness and swelling with redness noted over left parotid gland.     Mouth/Throat:     Lips: Pink.     Mouth: Mucous membranes are moist. No oral lesions.     Dentition: Normal dentition. No dental tenderness.     Pharynx: No oropharyngeal exudate.  Eyes:     Conjunctiva/sclera: Conjunctivae normal.     Pupils: Pupils are equal, round, and reactive to light.  Cardiovascular:     Rate and Rhythm: Normal rate and regular rhythm.     Heart sounds: Normal heart sounds.  Pulmonary:     Effort: Pulmonary effort is normal.     Breath sounds: Normal breath sounds.  Abdominal:     General: Bowel sounds are normal.     Palpations: Abdomen is soft.  Musculoskeletal:     Cervical back: Normal range of motion and neck supple.     Right lower leg: No edema.     Left lower leg: No edema.  Lymphadenopathy:     Cervical:     Right cervical: No superficial or posterior cervical adenopathy.    Left cervical: No superficial or posterior cervical adenopathy.     Upper Body:     Right upper body: No supraclavicular adenopathy.     Left upper body: No supraclavicular adenopathy.  Skin:    General: Skin is warm and dry.  Neurological:     Mental Status: She is alert.  Psychiatric:        Mood and Affect: Mood normal.    Labs reviewed: Recent Labs    01/24/24 0000 02/25/24 0000 04/07/24 0000  NA 137 135* 135*  K 4.4 4.2 3.9  CL 101 98* 99  CO2 28* 30* 28*  BUN 17 16 23*  CREATININE 1.3* 1.3* 1.4*  CALCIUM 9.3 9.5 9.5   Recent Labs    09/06/23 0000 04/07/24 0000  AST  --  16  ALT  --  11  ALKPHOS 61 60  ALBUMIN  --  3.7   Recent Labs    01/24/24 0000 02/25/24 0000 04/07/24 0000  WBC 7.2 7.0 8.4  NEUTROABS  --  4,039.00 5,620.00  HGB 11.2* 11.6*  11.9*  HCT 33* 35* 35*  PLT 248 265 277   Lab Results  Component Value Date   TSH 4.45 04/07/2024   Lab Results  Component Value Date   HGBA1C 5.6 04/07/2024   Lab Results  Component Value Date   CHOL 180 04/07/2024   HDL 48 04/07/2024   LDLCALC 102 04/07/2024   LDLDIRECT 132.0 07/12/2017   TRIG 180 (A) 04/07/2024   CHOLHDL 4.7 08/17/2021    Significant Diagnostic Results in last 30 days:  No results found.  Assessment/Plan  1. Parotid swelling (Primary) No pain in the mouth or ears Tenderness and swelling noted over gland Will start Augmentin 875-125 mg PO BID and get cbc with diff for possible early parotitis.  Staff to monitor VS and area and notify for worsening symptoms.   Tijuana Scheidegger K. Caro BODILY [facility] [phone]   Total time 25 mins:  time greater than 50% of total time spent doing pt counseling and coordination of care with family and staff

## 2024-05-31 ENCOUNTER — Encounter: Payer: Self-pay | Admitting: Student

## 2024-06-02 ENCOUNTER — Non-Acute Institutional Stay (SKILLED_NURSING_FACILITY): Payer: Self-pay | Admitting: Adult Health

## 2024-06-02 ENCOUNTER — Encounter: Payer: Self-pay | Admitting: Adult Health

## 2024-06-02 DIAGNOSIS — R609 Edema, unspecified: Secondary | ICD-10-CM

## 2024-06-02 DIAGNOSIS — I480 Paroxysmal atrial fibrillation: Secondary | ICD-10-CM

## 2024-06-02 DIAGNOSIS — E039 Hypothyroidism, unspecified: Secondary | ICD-10-CM | POA: Diagnosis not present

## 2024-06-02 LAB — CBC AND DIFFERENTIAL
HCT: 34 — AB (ref 36–46)
Hemoglobin: 11.5 — AB (ref 12.0–16.0)
Neutrophils Absolute: 4507
Platelets: 248 10*3/uL (ref 150–400)
WBC: 7.4

## 2024-06-02 LAB — CBC: RBC: 3.5 — AB (ref 3.87–5.11)

## 2024-06-02 NOTE — Progress Notes (Signed)
 Location:  Other [facility]) Nursing Home Room Number: [facility] [address] Place of Service:  SNF ([phone]) Provider:  Medina-Vargas, Nikolis Berent, DNP, FNP-BC  Patient Care Team: Abdul Fine, MD as PCP - General (Family Medicine) [name], MD as PCP - Cardiology (Cardiology) [name], RN (Inactive) as Oncology Nurse Navigator Caro Harlene POUR, NP as Nurse Practitioner (Geriatric Medicine)  Extended Emergency Contact Information Primary Emergency Contact: Kaczmarczyk,david Address: [address]          [place], [place] [address] [place] Home Phone: [phone] Mobile Phone: [phone] Relation: Son  Code Status:  DNR  Goals of care: Advanced Directive information    06/02/2024    2:17 PM  Advanced Directives  Does Patient Have a Medical Advance Directive? Yes  Type of Estate agent of [place];Living will;Out of facility DNR (pink MOST or yellow form)  Does patient want to make changes to medical advance directive? No - Patient declined  Copy of Healthcare Power of Attorney in Chart? Yes - validated most recent copy scanned in chart (See row information)     Chief Complaint  Patient presents with   Acute Visit    Left parotid gland infection.    HPI:   Pt is a 89 y.o. female seen today for an acute visit regarding parotid gland infection. She is a resident of [facility]. She is currently on Augmentin 875-125 mg BID X 7 days for left parotid gland infection. Daughter and son-in-law at bedside visiting from [place] . Left side of face with lump. Patient denies pain, no difficulty swallowing nor sore throat.  Hypothyroidism, unspecified type -  takes Levothyroxine  50 mcg daily  Paroxysmal atrial fibrillation (HCC) -  takes Eliquis  and Diltiazem  ER   Past Medical History:  Diagnosis Date   Breast cancer (HCC)    CKD (chronic kidney disease) stage 3, GFR 30-59 ml/min (HCC)    Displaced intertrochanteric fracture of  right femur (HCC)    Per [facility] EMR, [employer] Care   Former smoker    Heart disease    mitral valvular   Hyperlipidemia    Hyperparathyroidism, secondary (HCC)    Hypertension    Hypo-osmolality and hyponatremia    Per [employer] EMR, [employer] Care   Hypothyroidism    Macular degeneration    Per [employer] EMR, [employer] Care   Sensorineural hearing loss    Spinal stenosis of lumbar region 2006   s/p surgery   UTI (urinary tract infection)    Per [facility] EMR, [employer] Care   Vitamin D  deficiency    Per [facility] EMR, [employer] Care   Past Surgical History:  Procedure Laterality Date   ABDOMINAL HYSTERECTOMY     bilateral cataract surgery     BREAST LUMPECTOMY Right 07/22/2021   CHOLECYSTECTOMY  12/04/1978   FRACTURE SURGERY Left 12/29/2018   FEMUR   INTRAMEDULLARY (IM) NAIL INTERTROCHANTERIC Right 09/21/2022   Procedure: INTRAMEDULLARY (IM) NAIL INTERTROCHANTERIC;  Surgeon: Marchia Drivers, MD;  Location: [facility] ORS;  Service: Orthopedics;  Laterality: Right;   JOINT REPLACEMENT     Bilateral knee   LAPAROSCOPIC SIGMOID COLECTOMY  12/04/2009   secondary to benign mass   MELANOMA EXCISION WITH SENTINEL LYMPH NODE BIOPSY Right 07/22/2021   Procedure: MELANOMA EXCISION WITH SENTINEL LYMPH NODE BIOPSY;  Surgeon: Rodolph Romano, MD;  Location: [facility] ORS;  Service: General;  Laterality: Right;   PART MASTECTOMY,RADIO FREQUENCY LOCALIZER,AXILLARY SENTINEL NODE BIOPSY  Right 07/22/2021   Procedure: PART MASTECTOMY,RADIO FREQUENCY LOCALIZER,AXILLARY SENTINEL NODE BIOPSY;  Surgeon: Rodolph Romano, MD;  Location: [facility] ORS;  Service: General;  Laterality: Right;   REPLACEMENT TOTAL KNEE BILATERAL     SMALL INTESTINE SURGERY  12/04/2008   for SBO   SPINE SURGERY  12/04/2004   rods, [facility]   tendon repair     achille heall, left     Allergies  Allergen Reactions   Bactrim  [Sulfamethoxazole -Trimethoprim ] Nausea And Vomiting    Lethargic and  weakness    Outpatient Encounter Medications as of 06/02/2024  Medication Sig   acetaminophen  (TYLENOL ) 500 MG tablet Take 1,000 mg by mouth every 8 (eight) hours as needed. And Take Two tablets by mouth at bedtime scheduled   amoxicillin-clavulanate (AUGMENTIN) 875-125 MG tablet Take 1 tablet by mouth 2 (two) times daily.   apixaban  (ELIQUIS ) 2.5 MG TABS tablet Take 2.5 mg by mouth 2 (two) times daily.   Calcium Carb-Cholecalciferol (RA CALCIUM PLUS VITAMIN D3) 600-10 MG-MCG TABS Take 1 tablet by mouth 2 (two) times daily.   Cholecalciferol (VITAMIN D3) 50 MCG (2000 UT) TABS Take 1 tablet by mouth daily.   cyanocobalamin (VITAMIN B12) 1000 MCG tablet Take 1,000 mcg by mouth daily.   diltiazem  (CARDIZEM  LA) 120 MG 24 hr tablet Take 120 mg by mouth daily.   levothyroxine  (SYNTHROID ) 50 MCG tablet TAKE 1 TABLET EVERY DAY ON EMPTY STOMACHWITH A GLASS OF WATER  AT LEAST 30-60 MINBEFORE BREAKFAST   Magnesium  Oxide 250 MG TABS Take 1 tablet by mouth every other day. At bedtime   Multiple Vitamins-Minerals (PRESERVISION AREDS PO) Take 1 capsule by mouth 2 (two) times daily.   nystatin powder Apply 1 Application topically 2 (two) times daily. As needed   polyethylene glycol (MIRALAX  / GLYCOLAX ) 17 g packet Take 17 g by mouth every Monday, Wednesday, and Friday.   Vitamin D , Ergocalciferol , (DRISDOL ) 1.25 MG (50000 UNIT) CAPS capsule Take 50,000 Units by mouth every 7 (seven) days. Wednesday (Patient not taking: Reported on 06/02/2024)   No facility-administered encounter medications on file as of 06/02/2024.    Review of Systems  Constitutional:  Negative for appetite change, chills, fatigue and fever.  HENT:  Negative for congestion, hearing loss, rhinorrhea and sore throat.   Eyes: Negative.   Respiratory:  Negative for cough, shortness of breath and wheezing.   Cardiovascular:  Negative for chest pain, palpitations and leg swelling.  Gastrointestinal:  Negative for abdominal pain, constipation,  diarrhea, nausea and vomiting.  Genitourinary:  Negative for dysuria.  Musculoskeletal:  Negative for arthralgias, back pain and myalgias.  Skin:  Negative for color change, rash and wound.  Neurological:  Negative for dizziness, weakness and headaches.  Psychiatric/Behavioral:  Negative for behavioral problems. The patient is not nervous/anxious.     Immunization History  Administered Date(s) Administered   Influenza Split 10/03/2014, 10/04/2015   Influenza, High Dose Seasonal PF 10/04/2019, 09/21/2020, 09/20/2022   Influenza-Unspecified 09/03/2013, 10/01/2016, 10/06/2017, 09/18/2018, 09/26/2023   Moderna Covid-19 Fall Seasonal Vaccine 72yrs & older 03/13/2023   Moderna Covid-19 Vaccine Bivalent Booster 59yrs & up 04/21/2021   Moderna Sars-Covid-2 Vaccination 12/22/2019, 01/19/2020, 10/19/2020, 08/08/2021, 10/13/2022   PNEUMOCOCCAL CONJUGATE-20 10/04/2022   Pneumococcal Conjugate-13 10/26/2014   Pneumococcal Polysaccharide-23 04/04/1993, 09/20/2008, 01/28/2016   Tdap 02/14/2013, 07/11/2023   Unspecified SARS-COV-2 Vaccination 08/31/2023   Zoster Recombinant(Shingrix) 02/07/2017, 06/18/2017   Zoster, Live 12/08/2009   Pertinent  Health Maintenance Due  Topic Date Due   INFLUENZA VACCINE  07/04/2024  DEXA SCAN  Completed      09/25/2022   12:00 AM 09/25/2022    8:00 AM 09/27/2022    2:43 PM 03/09/2023    8:40 PM 07/10/2023    4:14 PM  Fall Risk  Falls in the past year?    1 1  Was there an injury with Fall?    1 1  Fall Risk Category Calculator    3 3  (RETIRED) Patient Fall Risk Level High fall risk  High fall risk  High fall risk     Patient at Risk for Falls Due to    History of fall(s);Impaired balance/gait;Impaired mobility;Mental status change;Impaired vision History of fall(s);Impaired balance/gait;Impaired mobility  Fall risk Follow up    Falls evaluation completed      Data saved with a previous flowsheet row definition     Vitals:   06/02/24 1415  BP: 131/75   Pulse: 66  Resp: 18  Temp: 97.8 F (36.6 C)  SpO2: 96%  Weight: 192 lb 12.8 oz (87.5 kg)  Height: 5' 5 (1.651 m)   Body mass index is 32.08 kg/m.  Physical Exam Constitutional:      Appearance: She is obese.  HENT:     Head: Normocephalic and atraumatic.     Nose: Nose normal.     Mouth/Throat:     Mouth: Mucous membranes are moist.  Eyes:     Conjunctiva/sclera: Conjunctivae normal.  Cardiovascular:     Rate and Rhythm: Normal rate. Rhythm irregular.  Pulmonary:     Effort: Pulmonary effort is normal.     Breath sounds: Normal breath sounds.  Abdominal:     General: Bowel sounds are normal.     Palpations: Abdomen is soft.  Musculoskeletal:        General: Normal range of motion.     Cervical back: Normal range of motion.  Skin:    General: Skin is warm and dry.     Comments: Left side of face lump  Neurological:     Mental Status: She is alert.  Psychiatric:        Mood and Affect: Mood normal.        Behavior: Behavior normal.      Labs reviewed: Recent Labs    01/24/24 0000 02/25/24 0000 04/07/24 0000  NA 137 135* 135*  K 4.4 4.2 3.9  CL 101 98* 99  CO2 28* 30* 28*  BUN 17 16 23*  CREATININE 1.3* 1.3* 1.4*  CALCIUM 9.3 9.5 9.5   Recent Labs    09/06/23 0000 04/07/24 0000  AST  --  16  ALT  --  11  ALKPHOS 61 60  ALBUMIN  --  3.7   Recent Labs    02/25/24 0000 04/07/24 0000 06/02/24 0000  WBC 7.0 8.4 7.4  NEUTROABS 4,039.00 5,620.00 4,507.00  HGB 11.6* 11.9* 11.5*  HCT 35* 35* 34*  PLT 265 277 248   Lab Results  Component Value Date   TSH 4.45 04/07/2024   Lab Results  Component Value Date   HGBA1C 5.6 04/07/2024   Lab Results  Component Value Date   CHOL 180 04/07/2024   HDL 48 04/07/2024   LDLCALC 102 04/07/2024   LDLDIRECT 132.0 07/12/2017   TRIG 180 (A) 04/07/2024   CHOLHDL 4.7 08/17/2021    Significant Diagnostic Results in last 30 days:  No results found.  Assessment/Plan  1. Parotid swelling (Primary) -   continue Augmentin 875-125 mg BID X 3 more days to  complete 10 days treatment -  continue warm compress TID to left side of face -  will order ultrasound of left side of face lump  2. Hypothyroidism, unspecified type Lab Results  Component Value Date   TSH 4.45 04/07/2024    -  continue Levothyroxine  50 mcg daily  3. Paroxysmal atrial fibrillation (HCC) -  rate-controlled -  continue Eliquis  2.5 mg BID for anticoagulation -  continue Diltiazem  ER 120 mg daily for rate-control    Family/ staff Communication: Discussed plan of care with resident, daughter, son-in-law and charge nurse.  Labs/tests ordered:  ultrasound of left face lump    Jereld Serum, DNP, MSN, FNP-BC [facility] [phone] (Monday-Friday 8:00 a.m. - 5:00 p.m.) [phone] (after hours)

## 2024-07-08 ENCOUNTER — Encounter: Payer: Self-pay | Admitting: Nurse Practitioner

## 2024-07-08 ENCOUNTER — Non-Acute Institutional Stay (SKILLED_NURSING_FACILITY): Payer: Self-pay | Admitting: Nurse Practitioner

## 2024-07-08 DIAGNOSIS — E039 Hypothyroidism, unspecified: Secondary | ICD-10-CM | POA: Diagnosis not present

## 2024-07-08 DIAGNOSIS — R609 Edema, unspecified: Secondary | ICD-10-CM | POA: Diagnosis not present

## 2024-07-08 DIAGNOSIS — E559 Vitamin D deficiency, unspecified: Secondary | ICD-10-CM | POA: Diagnosis not present

## 2024-07-08 DIAGNOSIS — N184 Chronic kidney disease, stage 4 (severe): Secondary | ICD-10-CM | POA: Diagnosis not present

## 2024-07-08 DIAGNOSIS — I480 Paroxysmal atrial fibrillation: Secondary | ICD-10-CM

## 2024-07-08 DIAGNOSIS — K5901 Slow transit constipation: Secondary | ICD-10-CM

## 2024-07-08 DIAGNOSIS — D649 Anemia, unspecified: Secondary | ICD-10-CM

## 2024-07-08 DIAGNOSIS — D6869 Other thrombophilia: Secondary | ICD-10-CM

## 2024-07-08 DIAGNOSIS — I10 Essential (primary) hypertension: Secondary | ICD-10-CM

## 2024-07-08 NOTE — Progress Notes (Unsigned)
 Location:  Other [facility].  Nursing Home Room Number: [facility] [id number] Place of Service:  SNF [phone]) Harlene An, NP  PCP: Abdul Fine, MD  Patient Care Team: Abdul Fine, MD as PCP - General (Family Medicine) [name], MD as PCP - Cardiology (Cardiology) [name], RN (Inactive) as Oncology Nurse Navigator An Harlene POUR, NP as Nurse Practitioner (Geriatric Medicine)  Extended Emergency Contact Information Primary Emergency Contact: Resurreccion,david Address: [address]          [place], [place] [address] [place] Home Phone: [phone] Mobile Phone: [phone] Relation: Son  Goals of care: Advanced Directive information    06/02/2024    2:17 PM  Advanced Directives  Does Patient Have a Medical Advance Directive? Yes  Type of Estate agent of [place];Living will;Out of facility DNR (pink MOST or yellow form)  Does patient want to make changes to medical advance directive? No - Patient declined  Copy of Healthcare Power of Attorney in Chart? Yes - validated most recent copy scanned in chart (See row information)     Chief Complaint  Patient presents with   Medical Management of Chronic Issues    Medical Management of Chronic Issues.     HPI:  Pt is a 89 y.o. female seen today for medical management of chronic disease.  Pt with hx of CKD, a fib, vit d def, hypothyroid, constipation.  Recently had parotid swelling and treated with Augmentin due to parotitis. Sweling has resolved and no pain at this time.  She reports she is doing well. No complaints at this time. Staff has no concerns.   Past Medical History:  Diagnosis Date   Breast cancer (HCC)    CKD (chronic kidney disease) stage 3, GFR 30-59 ml/min (HCC)    Displaced intertrochanteric fracture of right femur (HCC)    Per [facility] EMR, [employer] Care   Former smoker    Heart disease    mitral valvular   Hyperlipidemia     Hyperparathyroidism, secondary (HCC)    Hypertension    Hypo-osmolality and hyponatremia    Per [facility] EMR, [employer] Care   Hypothyroidism    Macular degeneration    Per [facility] EMR, [employer] Care   Sensorineural hearing loss    Spinal stenosis of lumbar region 2006   s/p surgery   UTI (urinary tract infection)    Per [facility] EMR, [employer] Care   Vitamin D  deficiency    Per [facility] EMR, [employer] Care   Past Surgical History:  Procedure Laterality Date   ABDOMINAL HYSTERECTOMY     bilateral cataract surgery     BREAST LUMPECTOMY Right 07/22/2021   CHOLECYSTECTOMY  12/04/1978   FRACTURE SURGERY Left 12/29/2018   FEMUR   INTRAMEDULLARY (IM) NAIL INTERTROCHANTERIC Right 09/21/2022   Procedure: INTRAMEDULLARY (IM) NAIL INTERTROCHANTERIC;  Surgeon: Marchia Drivers, MD;  Location: [facility] ORS;  Service: Orthopedics;  Laterality: Right;   JOINT REPLACEMENT     Bilateral knee   LAPAROSCOPIC SIGMOID COLECTOMY  12/04/2009   secondary to benign mass   MELANOMA EXCISION WITH SENTINEL LYMPH NODE BIOPSY Right 07/22/2021   Procedure: MELANOMA EXCISION WITH SENTINEL LYMPH NODE BIOPSY;  Surgeon: Rodolph Romano, MD;  Location: [facility] ORS;  Service: General;  Laterality: Right;   PART MASTECTOMY,RADIO FREQUENCY LOCALIZER,AXILLARY SENTINEL NODE BIOPSY Right 07/22/2021   Procedure: PART MASTECTOMY,RADIO FREQUENCY LOCALIZER,AXILLARY SENTINEL NODE BIOPSY;  Surgeon: Rodolph Romano, MD;  Location: [facility] ORS;  Service: General;  Laterality: Right;   REPLACEMENT TOTAL KNEE BILATERAL     SMALL INTESTINE SURGERY  12/04/2008   for SBO   SPINE SURGERY  12/04/2004   [facility], [facility]   tendon repair     achille heall, left     Allergies  Allergen Reactions   Bactrim  [Sulfamethoxazole -Trimethoprim ] Nausea And Vomiting    Lethargic and weakness    Outpatient Encounter Medications as of 07/08/2024  Medication Sig   acetaminophen  (TYLENOL ) 500 MG tablet Take 1,000 mg by mouth  every 8 (eight) hours as needed. And Take Two tablets by mouth at bedtime scheduled   apixaban  (ELIQUIS ) 2.5 MG TABS tablet Take 2.5 mg by mouth 2 (two) times daily.   Calcium Carb-Cholecalciferol (RA CALCIUM PLUS VITAMIN D3) 600-10 MG-MCG TABS Take 1 tablet by mouth 2 (two) times daily.   Cholecalciferol (VITAMIN D3) 50 MCG (2000 UT) TABS Take 1 tablet by mouth daily.   cyanocobalamin (VITAMIN B12) 1000 MCG tablet Take 1,000 mcg by mouth daily.   diltiazem  (CARDIZEM  LA) 120 MG 24 hr tablet Take 120 mg by mouth daily.   levothyroxine  (SYNTHROID ) 50 MCG tablet TAKE 1 TABLET EVERY DAY ON EMPTY STOMACHWITH A GLASS OF WATER  AT LEAST 30-60 MINBEFORE BREAKFAST   Magnesium  Oxide 250 MG TABS Take 1 tablet by mouth every other day. At bedtime   Multiple Vitamins-Minerals (PRESERVISION AREDS PO) Take 1 capsule by mouth 2 (two) times daily.   nystatin powder Apply 1 Application topically 2 (two) times daily. As needed   polyethylene glycol (MIRALAX  / GLYCOLAX ) 17 g packet Take 17 g by mouth every Monday, Wednesday, and Friday.   amoxicillin-clavulanate (AUGMENTIN) 875-125 MG tablet Take 1 tablet by mouth 2 (two) times daily. (Patient not taking: Reported on 07/08/2024)   Vitamin D , Ergocalciferol , (DRISDOL ) 1.25 MG (50000 UNIT) CAPS capsule Take 50,000 Units by mouth every 7 (seven) days. Wednesday (Patient not taking: Reported on 07/08/2024)   No facility-administered encounter medications on file as of 07/08/2024.    Review of Systems  Constitutional:  Negative for activity change, appetite change, fatigue and unexpected weight change.  HENT:  Negative for congestion and hearing loss.   Eyes: Negative.   Respiratory:  Negative for cough and shortness of breath.   Cardiovascular:  Negative for chest pain, palpitations and leg swelling.  Gastrointestinal:  Negative for abdominal pain, constipation and diarrhea.  Genitourinary:  Negative for difficulty urinating and dysuria.  Musculoskeletal:  Negative for  arthralgias and myalgias.  Skin:  Negative for color change and wound.  Neurological:  Negative for dizziness and weakness.  Psychiatric/Behavioral:  Negative for agitation, behavioral problems and confusion.      Immunization History  Administered Date(s) Administered   Influenza Split 10/03/2014, 10/04/2015   Influenza, High Dose Seasonal PF 10/04/2019, 09/21/2020, 09/20/2022   Influenza-Unspecified 09/03/2013, 10/01/2016, 10/06/2017, 09/18/2018, 09/26/2023   Moderna Covid-19 Fall Seasonal Vaccine 71yrs & older 03/13/2023   Moderna Covid-19 Vaccine Bivalent Booster 16yrs & up 04/21/2021   Moderna Sars-Covid-2 Vaccination 12/22/2019, 01/19/2020, 10/19/2020, 08/08/2021, 10/13/2022   PNEUMOCOCCAL CONJUGATE-20 10/04/2022   Pneumococcal Conjugate-13 10/26/2014   Pneumococcal Polysaccharide-23 04/04/1993, 09/20/2008, 01/28/2016   Tdap 02/14/2013, 07/11/2023   Unspecified SARS-COV-2 Vaccination 08/31/2023, 03/14/2024   Zoster Recombinant(Shingrix) 02/07/2017, 06/18/2017   Zoster, Live 12/08/2009   Pertinent  Health Maintenance Due  Topic Date Due   INFLUENZA VACCINE  07/04/2024   DEXA SCAN  Completed      09/25/2022   12:00 AM 09/25/2022    8:00 AM  09/27/2022    2:43 PM 03/09/2023    8:40 PM 07/10/2023    4:14 PM  Fall Risk  Falls in the past year?    1 1  Was there an injury with Fall?    1 1  Fall Risk Category Calculator    3 3  (RETIRED) Patient Fall Risk Level High fall risk  High fall risk  High fall risk     Patient at Risk for Falls Due to    History of fall(s);Impaired balance/gait;Impaired mobility;Mental status change;Impaired vision History of fall(s);Impaired balance/gait;Impaired mobility  Fall risk Follow up    Falls evaluation completed      Data saved with a previous flowsheet row definition   Functional Status Survey:    Vitals:   07/08/24 1237 07/08/24 1244  BP: (!) 143/80 118/75  Pulse: 86   Resp: 18   Temp: (!) 97.4 F (36.3 C)   SpO2: 96%   Weight:  187 lb 9.6 oz (85.1 kg)   Height: 5' 5 (1.651 m)    Body mass index is 31.22 kg/m. Physical Exam Constitutional:      General: She is not in acute distress.    Appearance: She is well-developed. She is not diaphoretic.  HENT:     Head: Normocephalic and atraumatic.     Mouth/Throat:     Pharynx: No oropharyngeal exudate.  Eyes:     Conjunctiva/sclera: Conjunctivae normal.     Pupils: Pupils are equal, round, and reactive to light.  Cardiovascular:     Rate and Rhythm: Normal rate and regular rhythm.     Heart sounds: Normal heart sounds.  Pulmonary:     Effort: Pulmonary effort is normal.     Breath sounds: Normal breath sounds.  Abdominal:     General: Bowel sounds are normal.     Palpations: Abdomen is soft.  Musculoskeletal:     Cervical back: Normal range of motion and neck supple.     Right lower leg: No edema.     Left lower leg: No edema.  Skin:    General: Skin is warm and dry.  Neurological:     Mental Status: She is alert.  Psychiatric:        Mood and Affect: Mood normal.     Labs reviewed: Recent Labs    01/24/24 0000 02/25/24 0000 04/07/24 0000  NA 137 135* 135*  K 4.4 4.2 3.9  CL 101 98* 99  CO2 28* 30* 28*  BUN 17 16 23*  CREATININE 1.3* 1.3* 1.4*  CALCIUM 9.3 9.5 9.5   Recent Labs    09/06/23 0000 04/07/24 0000  AST  --  16  ALT  --  11  ALKPHOS 61 60  ALBUMIN  --  3.7   Recent Labs    02/25/24 0000 04/07/24 0000 06/02/24 0000  WBC 7.0 8.4 7.4  NEUTROABS 4,039.00 5,620.00 4,507.00  HGB 11.6* 11.9* 11.5*  HCT 35* 35* 34*  PLT 265 277 248   Lab Results  Component Value Date   TSH 4.45 04/07/2024   Lab Results  Component Value Date   HGBA1C 5.6 04/07/2024   Lab Results  Component Value Date   CHOL 180 04/07/2024   HDL 48 04/07/2024   LDLCALC 102 04/07/2024   LDLDIRECT 132.0 07/12/2017   TRIG 180 (A) 04/07/2024   CHOLHDL 4.7 08/17/2021    Significant Diagnostic Results in last 30 days:  No results  found.  Assessment/Plan Vitamin D  deficiency Placed on Vit  D 50,000 units weekly for 12 weeks- which she has completed, vit d level normal and now to continue vit D 2000 units daily   Slow transit constipation Controlled on current regimen.   Hypothyroidism TSH at goal on last labs, continues on synthroid  50 mcg daily   Hypertension Blood pressure well controlled, goal bp <140/90 Continue current medications and dietary modifications follow metabolic panel  CKD (chronic kidney disease), stage IV (HCC) Chronic and stable Encourage proper hydration Follow metabolic panel Avoid nephrotoxic meds (NSAIDS)  Atrial fibrillation (HCC) Rate controlled on diltiazem . No chest pains or palpitations   Anemia Stable on recent labs, will follow.   Acquired thrombophilia (HCC) Due to a fib, continues on eliquis .  Parotid swelling Has resolved after completion of antibiotics    Jessica K. Caro BODILY [facility] [phone]

## 2024-07-09 NOTE — Assessment & Plan Note (Signed)
 Controlled on current regimen.

## 2024-07-09 NOTE — Assessment & Plan Note (Signed)
 Chronic and stable Encourage proper hydration Follow metabolic panel Avoid nephrotoxic meds (NSAIDS)

## 2024-07-09 NOTE — Assessment & Plan Note (Signed)
 TSH at goal on last labs, continues on synthroid 50 mcg daily

## 2024-07-09 NOTE — Assessment & Plan Note (Signed)
 Due to a fib, continues on eliquis.

## 2024-07-09 NOTE — Assessment & Plan Note (Signed)
 Rate controlled on diltiazem. No chest pains or palpitations

## 2024-07-09 NOTE — Assessment & Plan Note (Signed)
 Placed on Vit D 50,000 units weekly for 12 weeks- which she has completed, vit d level normal and now to continue vit D 2000 units daily

## 2024-07-09 NOTE — Assessment & Plan Note (Signed)
 Blood pressure well controlled, goal bp <140/90 Continue current medications and dietary modifications follow metabolic panel

## 2024-07-09 NOTE — Assessment & Plan Note (Signed)
 Stable on recent labs, will follow.

## 2024-07-22 ENCOUNTER — Encounter: Payer: Self-pay | Admitting: Nurse Practitioner

## 2024-07-22 ENCOUNTER — Non-Acute Institutional Stay (SKILLED_NURSING_FACILITY): Payer: Self-pay | Admitting: Nurse Practitioner

## 2024-07-22 DIAGNOSIS — Z0000 Encounter for general adult medical examination without abnormal findings: Secondary | ICD-10-CM

## 2024-07-22 NOTE — Progress Notes (Signed)
 Subjective:   Pamela Paul is a 89 y.o. female who presents for Medicare Annual (Subsequent) preventive examination.  Visit Complete: In person [facility]   Cardiac Risk Factors include: advanced age (>55men, >56 women);sedentary lifestyle     Objective:    Today's Vitals   07/22/24 1129  BP: 115/73  Pulse: 71  Resp: 18  Temp: (!) 97.4 F (36.3 C)  SpO2: 96%  Weight: 187 lb 9.6 oz (85.1 kg)  Height: 5' 5 (1.651 m)   Body mass index is 31.22 kg/m.     06/02/2024    2:17 PM 01/09/2024    9:59 AM 11/08/2023   12:35 PM 09/18/2023   10:19 AM 07/20/2023    9:09 AM 07/10/2023    4:28 PM 03/07/2023    9:47 AM  Advanced Directives  Does Patient Have a Medical Advance Directive? Yes Yes Yes Yes Yes Yes Yes  Type of Estate agent of [place];Living will;Out of facility DNR (pink MOST or yellow form) Healthcare Power of [place];Out of facility DNR (pink MOST or yellow form) Healthcare Power of [place];Out of facility DNR (pink MOST or yellow form) Healthcare Power of [place];Out of facility DNR (pink MOST or yellow form) Healthcare Power of [place];Out of facility DNR (pink MOST or yellow form) Healthcare Power of [place];Out of facility DNR (pink MOST or yellow form) Healthcare Power of [place];Living will;Out of facility DNR (pink MOST or yellow form)  Does patient want to make changes to medical advance directive? No - Patient declined No - Patient declined No - Patient declined No - Patient declined No - Patient declined No - Patient declined No - Patient declined  Copy of Healthcare Power of Attorney in Chart? Yes - validated most recent copy scanned in chart (See row information) Yes - validated most recent copy scanned in chart (See row information) Yes - validated most recent copy scanned in chart (See row information) Yes - validated most recent copy scanned in chart (See row information) Yes - validated most recent copy scanned in chart (See row information)  Yes - validated most recent copy scanned in chart (See row information) No - copy requested  Pre-existing out of facility DNR order (yellow form or pink MOST form)     Yellow form placed in chart (order not valid for inpatient use)      Current Medications (verified) Outpatient Encounter Medications as of 07/22/2024  Medication Sig   acetaminophen  (TYLENOL ) 500 MG tablet Take 1,000 mg by mouth every 8 (eight) hours as needed. And Take Two tablets by mouth at bedtime scheduled   apixaban  (ELIQUIS ) 2.5 MG TABS tablet Take 2.5 mg by mouth 2 (two) times daily.   Calcium Carb-Cholecalciferol (RA CALCIUM PLUS VITAMIN D3) 600-10 MG-MCG TABS Take 1 tablet by mouth 2 (two) times daily.   Cholecalciferol (VITAMIN D3) 50 MCG (2000 UT) TABS Take 1 tablet by mouth daily.   cyanocobalamin (VITAMIN B12) 1000 MCG tablet Take 1,000 mcg by mouth daily.   diltiazem  (CARDIZEM  LA) 120 MG 24 hr tablet Take 120 mg by mouth daily.   levothyroxine  (SYNTHROID ) 50 MCG tablet TAKE 1 TABLET EVERY DAY ON EMPTY STOMACHWITH A GLASS OF WATER  AT LEAST 30-60 MINBEFORE BREAKFAST   Magnesium  Oxide 250 MG TABS Take 1 tablet by mouth every other day. At bedtime   Multiple Vitamins-Minerals (PRESERVISION AREDS PO) Take 1 capsule by mouth 2 (two) times daily.   nystatin powder Apply 1 Application topically 2 (two) times daily. As needed  polyethylene glycol (MIRALAX  / GLYCOLAX ) 17 g packet Take 17 g by mouth every Monday, Wednesday, and Friday.   No facility-administered encounter medications on file as of 07/22/2024.    Allergies (verified) Bactrim  [sulfamethoxazole -trimethoprim ]   History: Past Medical History:  Diagnosis Date   Breast cancer (HCC)    CKD (chronic kidney disease) stage 3, GFR 30-59 ml/min (HCC)    Displaced intertrochanteric fracture of right femur (HCC)    Per [facility] EMR, [employer] Care   Former smoker    Heart disease    mitral valvular   Hyperlipidemia    Hyperparathyroidism, secondary (HCC)     Hypertension    Hypo-osmolality and hyponatremia    Per [employer] EMR, [employer] Care   Hypothyroidism    Macular degeneration    Per [employer] EMR, [employer] Care   Sensorineural hearing loss    Spinal stenosis of lumbar region 2006   s/p surgery   UTI (urinary tract infection)    Per [facility] EMR, [employer] Care   Vitamin D  deficiency    Per [facility] EMR, [employer] Care   Past Surgical History:  Procedure Laterality Date   ABDOMINAL HYSTERECTOMY     bilateral cataract surgery     BREAST LUMPECTOMY Right 07/22/2021   CHOLECYSTECTOMY  12/04/1978   FRACTURE SURGERY Left 12/29/2018   FEMUR   INTRAMEDULLARY (IM) NAIL INTERTROCHANTERIC Right 09/21/2022   Procedure: INTRAMEDULLARY (IM) NAIL INTERTROCHANTERIC;  Surgeon: Marchia Drivers, MD;  Location: [facility] ORS;  Service: Orthopedics;  Laterality: Right;   JOINT REPLACEMENT     Bilateral knee   LAPAROSCOPIC SIGMOID COLECTOMY  12/04/2009   secondary to benign mass   MELANOMA EXCISION WITH SENTINEL LYMPH NODE BIOPSY Right 07/22/2021   Procedure: MELANOMA EXCISION WITH SENTINEL LYMPH NODE BIOPSY;  Surgeon: Rodolph Romano, MD;  Location: [facility] ORS;  Service: General;  Laterality: Right;   PART MASTECTOMY,RADIO FREQUENCY LOCALIZER,AXILLARY SENTINEL NODE BIOPSY Right 07/22/2021   Procedure: PART MASTECTOMY,RADIO FREQUENCY LOCALIZER,AXILLARY SENTINEL NODE BIOPSY;  Surgeon: Rodolph Romano, MD;  Location: [facility] ORS;  Service: General;  Laterality: Right;   REPLACEMENT TOTAL KNEE BILATERAL     SMALL INTESTINE SURGERY  12/04/2008   for SBO   SPINE SURGERY  12/04/2004   [facility], [facility]   tendon repair     achille heall, left    Family History  Problem Relation Age of Onset   Arthritis Mother    Stroke Mother    Hyperlipidemia Father    Cancer Father    Heart disease Paternal Aunt    Social History   Socioeconomic History   Marital status: Widowed    Spouse name: Not on file   Number of children: 5    Years of education: Not on file   Highest education level: Not on file  Occupational History   Occupation: Preschool Engineer, maintenance    Comment: Retired  Tobacco Use   Smoking status: Former    Current packs/day: 0.00    Types: Cigarettes    Quit date: 03/15/1961    Years since quitting: 63.3   Smokeless tobacco: Never   Tobacco comments:    Quit many years ago.  Vaping Use   Vaping status: Never Used  Substance and Sexual Activity   Alcohol use: Not Currently   Drug use: No   Sexual activity: Never  Other Topics Concern   Not on file  Social History Narrative   2 daughters/3 sons      Has living  will   Son Alm is [facility] POA   Has DNR   Would accept hospital care   Would consider tube feeds--but not if persistent cognitive unawareness   Social Drivers of Health   Financial Resource Strain: Low Risk  (03/02/2022)   Overall Financial Resource Strain (CARDIA)    Difficulty of Paying Living Expenses: Not hard at all  Food Insecurity: No Food Insecurity (09/22/2022)   Hunger Vital Sign    Worried About Running Out of Food in the Last Year: Never true    Ran Out of Food in the Last Year: Never true  Transportation Needs: No Transportation Needs (09/22/2022)   PRAPARE - Administrator, Civil Service (Medical): No    Lack of Transportation (Non-Medical): No  Physical Activity: Insufficiently Active (03/02/2022)   Exercise Vital Sign    Days of Exercise per Week: 2 days    Minutes of Exercise per Session: 60 min  Stress: No Stress Concern Present (03/02/2022)   [employer] of Occupational Health - Occupational Stress Questionnaire    Feeling of Stress : Not at all  Social Connections: Unknown (03/02/2022)   Social Connection and Isolation Panel    Frequency of Communication with Friends and Family: More than three times a week    Frequency of Social Gatherings with Friends and Family: Twice a week    Attends Religious Services: Not on Energy manager or Organizations: Yes    Attends Banker Meetings: Not on file    Marital Status: Widowed    Tobacco Counseling Counseling given: Not Answered Tobacco comments: Quit many years ago.   Clinical Intake:  Pre-visit preparation completed: Yes  Pain : No/denies pain     BMI - recorded: 31 Nutritional Status: BMI > 30  Obese  How often do you need to have someone help you when you read instructions, pamphlets, or other written materials from your doctor or pharmacy?: 4 - Often         Activities of Daily Living    07/22/2024   10:18 AM 07/22/2024   10:12 AM  In your present state of health, do you have any difficulty performing the following activities:  Hearing?  1  Vision? 1 0  Difficulty concentrating or making decisions?  1  Walking or climbing stairs?  1  Dressing or bathing?  1  Doing errands, shopping?  1  Preparing Food and eating ?  Y  Comment  does not prepare food  Using the Toilet?  N  In the past six months, have you accidently leaked urine?  Y  Do you have problems with loss of bowel control?  N  Managing your Medications?  Y  Managing your Finances?  Y  Housekeeping or managing your Housekeeping?  Y    Patient Care Team: Abdul Fine, MD as PCP - General (Family Medicine) [name], MD as PCP - Cardiology (Cardiology) [name], RN (Inactive) as Oncology Nurse Navigator Caro, Harlene POUR, NP as Nurse Practitioner (Geriatric Medicine)  Indicate any recent Medical Services you may have received from other than [facility] providers in the past year (date may be approximate).     Assessment:   This is a routine wellness examination for [place].  Hearing/Vision screen No results found.   Goals Addressed   None    Depression Screen    07/22/2024   10:18 AM 07/10/2023    4:15 PM 03/02/2022    1:59 PM  09/13/2021    1:50 PM 08/17/2021   10:05 AM 05/31/2021    3:36 PM 03/01/2021    8:50 AM  PHQ 2/9 Scores  PHQ - 2  Score 0 0 0 0 0 0 0    Fall Risk    07/22/2024   10:18 AM 07/10/2023    4:14 PM 03/09/2023    8:40 PM 03/02/2022    2:02 PM 09/13/2021    1:48 PM  Fall Risk   Falls in the past year? 0 1 1 0 1  Number falls in past yr: 0 1 1 0 1  Injury with Fall? 0 1 1  0  Risk for fall due to :  History of fall(s);Impaired balance/gait;Impaired mobility History of fall(s);Impaired balance/gait;Impaired mobility;Mental status change;Impaired vision    Follow up   Falls evaluation completed Falls evaluation completed  Falls evaluation completed      Data saved with a previous flowsheet row definition    MEDICARE RISK AT HOME: Medicare Risk at Home Any stairs in or around the home?: No Home free of loose throw rugs in walkways, pet beds, electrical cords, etc?: Yes Adequate lighting in your home to reduce risk of falls?: Yes Life alert?: No Use of a cane, walker or w/c?: Yes Grab bars in the bathroom?: Yes Shower chair or bench in shower?: Yes Elevated toilet seat or a handicapped toilet?: Yes  TIMED UP AND GO:  Was the test performed?  No    Cognitive Function:    10/11/2016    8:32 AM  MMSE - Mini Mental State Exam  Orientation to time 5   Orientation to Place 5   Registration 3   Attention/ Calculation 5   Recall 3   Language- name 2 objects 2   Language- repeat 1  Language- follow 3 step command 3   Language- read & follow direction 1   Write a sentence 1   Copy design 1   Total score 30      Data saved with a previous flowsheet row definition        03/02/2022    2:05 PM 02/02/2020    9:08 AM 01/31/2019    9:45 AM 11/02/2017   12:48 PM 10/11/2016    8:34 AM  6CIT Screen  What Year? 0 points 0 points 0 points 0 points 0 points  What month? 0 points 0 points 0 points 0 points 0 points  What time? 0 points 0 points 0 points 0 points 0 points  Count back from 20 0 points 0 points 0 points 0 points 0 points  Months in reverse  0 points 0 points 0 points 0 points  Repeat phrase   0 points  0 points 0 points  Total Score  0 points  0 points 0 points    Immunizations Immunization History  Administered Date(s) Administered   Influenza Split 10/03/2014, 10/04/2015   Influenza, High Dose Seasonal PF 10/04/2019, 09/21/2020, 09/20/2022   Influenza-Unspecified 09/03/2013, 10/01/2016, 10/06/2017, 09/18/2018, 09/26/2023   Moderna Covid-19 Fall Seasonal Vaccine 7yrs & older 03/13/2023   Moderna Covid-19 Vaccine Bivalent Booster 103yrs & up 04/21/2021   Moderna Sars-Covid-2 Vaccination 12/22/2019, 01/19/2020, 10/19/2020, 08/08/2021, 10/13/2022   PNEUMOCOCCAL CONJUGATE-20 10/04/2022   Pneumococcal Conjugate-13 10/26/2014   Pneumococcal Polysaccharide-23 04/04/1993, 09/20/2008, 01/28/2016   Tdap 02/14/2013, 07/11/2023   Unspecified SARS-COV-2 Vaccination 08/31/2023, 03/14/2024   Zoster Recombinant(Shingrix) 02/07/2017, 06/18/2017   Zoster, Live 12/08/2009    TDAP status: Up to date  Flu Vaccine status: Due,  Education has been provided regarding the importance of this vaccine. Advised may receive this vaccine at local pharmacy or Health Dept. Aware to provide a copy of the vaccination record if obtained from local pharmacy or Health Dept. Verbalized acceptance and understanding.  Pneumococcal vaccine status: Up to date  Covid-19 vaccine status: Information provided on how to obtain vaccines.   Qualifies for Shingles Vaccine? Yes   Zostavax completed No   Shingrix Completed?: Yes  Screening Tests Health Maintenance  Topic Date Due   COVID-19 Vaccine (10 - 2024-25 season) 05/09/2024   INFLUENZA VACCINE  07/04/2024   Medicare Annual Wellness (AWV)  07/22/2025   DTaP/Tdap/Td (3 - Td or Tdap) 07/10/2033   Pneumococcal Vaccine: 50+ Years  Completed   DEXA SCAN  Completed   Zoster Vaccines- Shingrix  Completed   HPV VACCINES  Aged Out   Meningococcal B Vaccine  Aged Out    Health Maintenance  Health Maintenance Due  Topic Date Due   COVID-19 Vaccine (10 -  2024-25 season) 05/09/2024   INFLUENZA VACCINE  07/04/2024    Colorectal cancer screening: No longer required.   Mammogram status: No longer required due to age.   Lung Cancer Screening: (Low Dose CT Chest recommended if Age 9-80 years, 20 pack-year currently smoking OR have quit w/in 15years.) does not qualify.   Lung Cancer Screening Referral: na  Additional Screening:  Hepatitis C Screening: does not qualify; Completed na  Vision Screening: Recommended annual ophthalmology exams for early detection of glaucoma and other disorders of the eye. Is the patient up to date with their annual eye exam?  Yes  Who is the provider or what is the name of the office in which the patient attends annual eye exams? Unsure of name If pt is not established with a provider, would they like to be referred to a provider to establish care? No .   Dental Screening: Recommended annual dental exams for proper oral hygiene   Community Resource Referral / Chronic Care Management: CRR required this visit?  No   CCM required this visit?  No     Plan:     I have personally reviewed and noted the following in the patient's chart:   Medical and social history Use of alcohol, tobacco or illicit drugs  Current medications and supplements including opioid prescriptions. Patient is not currently taking opioid prescriptions. Functional ability and status Nutritional status Physical activity Advanced directives List of other physicians Hospitalizations, surgeries, and ER visits in previous 12 months Vitals Screenings to include cognitive, depression, and falls Referrals and appointments  In addition, I have reviewed and discussed with patient certain preventive protocols, quality metrics, and best practice recommendations. A written personalized care plan for preventive services as well as general preventive health recommendations were provided to patient.     Harlene MARLA An, NP   07/22/2024

## 2024-09-11 ENCOUNTER — Encounter: Payer: Self-pay | Admitting: Adult Health

## 2024-09-11 ENCOUNTER — Non-Acute Institutional Stay (SKILLED_NURSING_FACILITY): Payer: Self-pay | Admitting: Adult Health

## 2024-09-11 DIAGNOSIS — N1832 Chronic kidney disease, stage 3b: Secondary | ICD-10-CM | POA: Diagnosis not present

## 2024-09-11 DIAGNOSIS — E559 Vitamin D deficiency, unspecified: Secondary | ICD-10-CM | POA: Diagnosis not present

## 2024-09-11 DIAGNOSIS — E039 Hypothyroidism, unspecified: Secondary | ICD-10-CM | POA: Diagnosis not present

## 2024-09-11 DIAGNOSIS — I480 Paroxysmal atrial fibrillation: Secondary | ICD-10-CM | POA: Diagnosis not present

## 2024-09-11 NOTE — Progress Notes (Unsigned)
 [facility]  [facility] Room Number: [place] 513-A Place of Service:  SNF (31) Provider:  Medina-Vargas, Bethann Qualley, DNP, FNP-BC  Patient Care Team: Abdul Fine, MD as PCP - General (Family Medicine) [name], MD as PCP - Cardiology (Cardiology) [name], RN (Inactive) as Oncology Nurse Navigator Caro Harlene POUR, NP as Nurse Practitioner (Geriatric Medicine)  Extended Emergency Contact Information Primary Emergency Contact: Polinsky,david Address: [address]          [place], [place] [address] [place] Home Phone: [phone] Mobile Phone: [phone] Relation: Son  Code Status:  DNR  Goals of care: Advanced Directive information    09/11/2024    4:09 PM  Advanced Directives  Does Patient Have a Medical Advance Directive? Yes  Type of Estate agent of [place];Living will;Out of facility DNR (pink MOST or yellow form)  Does patient want to make changes to medical advance directive? No - Patient declined  Copy of Healthcare Power of Attorney in Chart? Yes - validated most recent copy scanned in chart (See row information)  Pre-existing out of facility DNR order (yellow form or pink MOST form) Yellow form placed in chart (order not valid for inpatient use)     Chief Complaint  Patient presents with   Medical Management of Chronic Issues    Routine Visit, needs to discuss mammogram, flu, and covid vaccine    HPI:  Pt is a 89 y.o. female seen today for medical management of chronic diseases. She is a resident of [facility].  Paroxysmal atrial fibrillation (HCC) -  takes apixaban  and diltiazem   Hypothyroidism, unspecified type -  tsh 4.45, 04/07/24, takes levothyroxine   Vitamin D  deficiency takes -  Cholecalciferol, vitamin D  level 77, 05/12/24  Chronic kidney disease, stage 3b (HCC) -GFR 34, BUN 23, creatinine 1.43, 04/07/24    Past Medical History:  Diagnosis Date   Breast cancer  (HCC)    CKD (chronic kidney disease) stage 3, GFR 30-59 ml/min (HCC)    Displaced intertrochanteric fracture of right femur (HCC)    Per [facility] EMR, [employer] Care   Former smoker    Heart disease    mitral valvular   Hyperlipidemia    Hyperparathyroidism, secondary    Hypertension    Hypo-osmolality and hyponatremia    Per [employer] EMR, [employer] Care   Hypothyroidism    Macular degeneration    Per [employer] EMR, [employer] Care   Sensorineural hearing loss    Spinal stenosis of lumbar region 2006   s/p surgery   UTI (urinary tract infection)    Per [facility] EMR, [employer] Care   Vitamin D  deficiency    Per [facility] EMR, [employer] Care   Past Surgical History:  Procedure Laterality Date   ABDOMINAL HYSTERECTOMY     bilateral cataract surgery     BREAST LUMPECTOMY Right 07/22/2021   CHOLECYSTECTOMY  12/04/1978   FRACTURE SURGERY Left 12/29/2018   FEMUR   INTRAMEDULLARY (IM) NAIL INTERTROCHANTERIC Right 09/21/2022   Procedure: INTRAMEDULLARY (IM) NAIL INTERTROCHANTERIC;  Surgeon: Marchia Drivers, MD;  Location: [facility] ORS;  Service: Orthopedics;  Laterality: Right;   JOINT REPLACEMENT     Bilateral knee   LAPAROSCOPIC SIGMOID COLECTOMY  12/04/2009   secondary to benign mass   MELANOMA EXCISION WITH SENTINEL LYMPH NODE BIOPSY Right 07/22/2021   Procedure: MELANOMA EXCISION WITH SENTINEL LYMPH NODE BIOPSY;  Surgeon: Rodolph Romano, MD;  Location: [facility]  ORS;  Service: General;  Laterality: Right;   PART MASTECTOMY,RADIO FREQUENCY LOCALIZER,AXILLARY SENTINEL NODE BIOPSY Right 07/22/2021   Procedure: PART MASTECTOMY,RADIO FREQUENCY LOCALIZER,AXILLARY SENTINEL NODE BIOPSY;  Surgeon: Rodolph Romano, MD;  Location: [facility] ORS;  Service: General;  Laterality: Right;   REPLACEMENT TOTAL KNEE BILATERAL     SMALL INTESTINE SURGERY  12/04/2008   for SBO   SPINE SURGERY  12/04/2004   [facility]ds, [facility]   tendon repair     achille heall, left     Allergies   Allergen Reactions   Bactrim  [Sulfamethoxazole -Trimethoprim ] Nausea And Vomiting    Lethargic and weakness    Outpatient Encounter Medications as of 09/11/2024  Medication Sig   acetaminophen  (TYLENOL ) 500 MG tablet Take 1,000 mg by mouth every 8 (eight) hours as needed. And Take Two tablets by mouth at bedtime scheduled   apixaban  (ELIQUIS ) 2.5 MG TABS tablet Take 2.5 mg by mouth 2 (two) times daily.   Calcium Carb-Cholecalciferol (RA CALCIUM PLUS VITAMIN D3) 600-10 MG-MCG TABS Take 1 tablet by mouth 2 (two) times daily.   Cholecalciferol (VITAMIN D3) 50 MCG (2000 UT) TABS Take 1 tablet by mouth daily.   cyanocobalamin (VITAMIN B12) 1000 MCG tablet Take 1,000 mcg by mouth daily.   diltiazem  (CARDIZEM  CD) 120 MG 24 hr capsule Take 120 mg by mouth daily.   levothyroxine  (SYNTHROID ) 50 MCG tablet TAKE 1 TABLET EVERY DAY ON EMPTY STOMACHWITH A GLASS OF WATER  AT LEAST 30-60 MINBEFORE BREAKFAST   Magnesium  Oxide 250 MG TABS Take 1 tablet by mouth every other day. At bedtime   Multiple Vitamins-Minerals (PRESERVISION AREDS PO) Take 1 capsule by mouth 2 (two) times daily.   nystatin powder Apply 1 Application topically 2 (two) times daily. As needed   polyethylene glycol (MIRALAX  / GLYCOLAX ) 17 g packet Take 17 g by mouth every Monday, Wednesday, and Friday.   [DISCONTINUED] diltiazem  (CARDIZEM  LA) 120 MG 24 hr tablet Take 120 mg by mouth daily.   No facility-administered encounter medications on file as of 09/11/2024.    Review of Systems  Constitutional:  Negative for appetite change, chills, fatigue and fever.  HENT:  Negative for congestion, hearing loss, rhinorrhea and sore throat.   Eyes: Negative.   Respiratory:  Negative for cough, shortness of breath and wheezing.   Cardiovascular:  Negative for chest pain, palpitations and leg swelling.  Gastrointestinal:  Negative for abdominal pain, constipation, diarrhea, nausea and vomiting.  Genitourinary:  Negative for dysuria.   Musculoskeletal:  Negative for arthralgias, back pain and myalgias.  Skin:  Negative for color change, rash and wound.  Neurological:  Negative for dizziness, weakness and headaches.  Psychiatric/Behavioral:  Negative for behavioral problems. The patient is not nervous/anxious.     Immunization History  Administered Date(s) Administered   INFLUENZA, HIGH DOSE SEASONAL PF 10/04/2019, 09/21/2020, 09/20/2022   Influenza Split 10/03/2014, 10/04/2015   Influenza-Unspecified 09/03/2013, 10/01/2016, 10/06/2017, 09/18/2018, 09/26/2023   Moderna Covid-19 Fall Seasonal Vaccine 19yrs & older 03/13/2023   Moderna Covid-19 Vaccine Bivalent Booster 84yrs & up 04/21/2021   Moderna Sars-Covid-2 Vaccination 12/22/2019, 01/19/2020, 10/19/2020, 08/08/2021, 10/13/2022   PNEUMOCOCCAL CONJUGATE-20 10/04/2022   Pneumococcal Conjugate-13 10/26/2014   Pneumococcal Polysaccharide-23 04/04/1993, 09/20/2008, 01/28/2016   Tdap 02/14/2013, 07/11/2023   Unspecified SARS-COV-2 Vaccination 08/31/2023, 03/14/2024   Zoster Recombinant(Shingrix) 02/07/2017, 06/18/2017   Zoster, Live 12/08/2009   Pertinent  Health Maintenance Due  Topic Date Due   Mammogram  06/08/2022   Influenza Vaccine  07/04/2024   DEXA SCAN  Completed  09/25/2022    8:00 AM 09/27/2022    2:43 PM 03/09/2023    8:40 PM 07/10/2023    4:14 PM 07/22/2024   10:18 AM  Fall Risk  Falls in the past year?   1 1 0  Was there an injury with Fall?   1 1 0  Fall Risk Category Calculator   3 3 0  (RETIRED) Patient Fall Risk Level High fall risk  High fall risk      Patient at Risk for Falls Due to   History of fall(s);Impaired balance/gait;Impaired mobility;Mental status change;Impaired vision History of fall(s);Impaired balance/gait;Impaired mobility   Fall risk Follow up   Falls evaluation completed       Data saved with a previous flowsheet row definition     Vitals:   09/11/24 1613  BP: 110/70  Pulse: 69  Resp: 18  Temp: (!) 97.4 F (36.3  C)  SpO2: 97%  Weight: 190 lb (86.2 kg)  Height: 5' 5 (1.651 m)   Body mass index is 31.62 kg/m.  Physical Exam Constitutional:      General: She is not in acute distress.    Appearance: She is obese.  HENT:     Head: Normocephalic and atraumatic.     Nose: Nose normal.     Mouth/Throat:     Mouth: Mucous membranes are moist.  Eyes:     Conjunctiva/sclera: Conjunctivae normal.  Cardiovascular:     Rate and Rhythm: Normal rate and regular rhythm.  Pulmonary:     Effort: Pulmonary effort is normal.     Breath sounds: Normal breath sounds.  Abdominal:     General: Bowel sounds are normal.     Palpations: Abdomen is soft.  Musculoskeletal:        General: Swelling present. Normal range of motion.     Cervical back: Normal range of motion.     Right lower leg: Edema present.     Left lower leg: Edema present.     Comments: Trace edema of bilateral lower extremities, has compression socks  Skin:    General: Skin is warm and dry.  Neurological:     General: No focal deficit present.     Mental Status: She is alert and oriented to person, place, and time.  Psychiatric:        Mood and Affect: Mood normal.        Behavior: Behavior normal.        Thought Content: Thought content normal.        Judgment: Judgment normal.        Labs reviewed: Recent Labs    01/24/24 0000 02/25/24 0000 04/07/24 0000  NA 137 135* 135*  K 4.4 4.2 3.9  CL 101 98* 99  CO2 28* 30* 28*  BUN 17 16 23*  CREATININE 1.3* 1.3* 1.4*  CALCIUM 9.3 9.5 9.5   Recent Labs    04/07/24 0000  AST 16  ALT 11  ALKPHOS 60  ALBUMIN 3.7   Recent Labs    02/25/24 0000 04/07/24 0000 06/02/24 0000  WBC 7.0 8.4 7.4  NEUTROABS 4,039.00 5,620.00 4,507.00  HGB 11.6* 11.9* 11.5*  HCT 35* 35* 34*  PLT 265 277 248   Lab Results  Component Value Date   TSH 4.45 04/07/2024   Lab Results  Component Value Date   HGBA1C 5.6 04/07/2024   Lab Results  Component Value Date   CHOL 180 04/07/2024    HDL 48 04/07/2024   LDLCALC 102  04/07/2024   LDLDIRECT 132.0 07/12/2017   TRIG 180 (A) 04/07/2024   CHOLHDL 4.7 08/17/2021    Significant Diagnostic Results in last 30 days:  No results found.  Assessment/Plan  1. Paroxysmal atrial fibrillation (HCC) (Primary) -  rate-controlled - Continue apixaban  2.5 mg twice a day for anticoagulation -   Continue diltiazem  HCl ER 120 mg 1 capsule daily daily for rate control  2. Hypothyroidism, unspecified type Lab Results  Component Value Date   TSH 4.45 04/07/2024    -   Continue levothyroxine  50 mcg 1 tab at bedtime  3. Vitamin D  deficiency -  vitamin D  level 77, 05/12/24 - Continue cholecalciferol 50 mcg 1 tablet daily  4. Chronic kidney disease, stage 3b (HCC) Lab Results  Component Value Date   NA 135 (A) 04/07/2024   K 3.9 04/07/2024   CO2 28 (A) 04/07/2024   GLUCOSE 117 (H) 09/27/2022   BUN 23 (A) 04/07/2024   CREATININE 1.4 (A) 04/07/2024   CALCIUM 9.5 04/07/2024   GFR 22.63 (L) 09/13/2021   EGFR 34 04/07/2024   GFRNONAA 37 (L) 09/27/2022    -   stable   Family/ staff Communication: Discussed plan of care with resident and charge nurse.  Labs/tests ordered:  None    Sherman Donaldson Medina-Vargas, DNP, MSN, FNP-BC [facility] [phone] (Monday-Friday 8:00 a.m. - 5:00 p.m.) [phone] (after hours)

## 2024-10-02 ENCOUNTER — Non-Acute Institutional Stay (SKILLED_NURSING_FACILITY): Payer: Self-pay | Admitting: Internal Medicine

## 2024-10-02 ENCOUNTER — Encounter: Payer: Self-pay | Admitting: Internal Medicine

## 2024-10-02 DIAGNOSIS — L219 Seborrheic dermatitis, unspecified: Secondary | ICD-10-CM | POA: Diagnosis not present

## 2024-10-02 NOTE — Assessment & Plan Note (Addendum)
 Keep her necklace off her neck. Start kenalog cream 0.1 % bid for 7 days.

## 2024-10-02 NOTE — Progress Notes (Signed)
 [facility] Acute Care Progress Note    Location:  Other [facility].  Nursing Home Room Number: [place] [id number] Place of Service:  SNF (31)   Laurence Locus, DO   Patient Care Team: Laurence Locus, DO as PCP - General (Internal Medicine) [name], MD as PCP - Cardiology (Cardiology) [name], RN (Inactive) as Oncology Nurse Navigator Caro Harlene POUR, NP as Nurse Practitioner (Geriatric Medicine)   Extended Emergency Contact Information Primary Emergency Contact: Al,david Address: [address]          [place], [place] [address] [place] Home Phone: [phone] Mobile Phone: [phone] Relation: Son   Goals of care: Advanced Directive information    09/11/2024    4:09 PM  Advanced Directives  Does Patient Have a Medical Advance Directive? Yes  Type of Estate Agent of [place];Living will;Out of facility DNR (pink MOST or yellow form)  Does patient want to make changes to medical advance directive? No - Patient declined  Copy of Healthcare Power of Attorney in Chart? Yes - validated most recent copy scanned in chart (See row information)  Pre-existing out of facility DNR order (yellow form or pink MOST form) Yellow form placed in chart (order not valid for inpatient use)     CODE STATUS: Do Not Resuscitate (DNR)   Chief Complaint  Patient presents with   Rash    Rash on Chest.      HPI: Pt is a 89 y.o. female seen today for an acute visit for Rash on Chest.   Patient has noted an itchy rash in the center of her chest where her Diamond pendant chain she wears has been rubbing.  It has been itchy.  She has been putting lotion on the rash.  Has not improved.  She finally took her necklace off.  It is not bleeding.     Past Medical History:  Diagnosis Date   Breast cancer (HCC)    CKD (chronic kidney disease) stage 3, GFR 30-59 ml/min (HCC)    Displaced intertrochanteric fracture of right femur (HCC)    Per [facility], [employer] Care   Former smoker    Heart disease    mitral valvular   Hyperlipidemia    Hyperparathyroidism, secondary    Hypertension    Hypo-osmolality and hyponatremia    Per [employer] EMR, [employer] Care   Hypothyroidism    Macular degeneration    Per [employer] EMR, [employer] [facility]   Sensorineural hearing loss    Spinal stenosis of lumbar region 2006   s/p surgery   UTI (urinary tract infection)    Per [facility] EMR, [employer] Care   Vitamin D  deficiency    Per [facility] EMR, [employer] Care   Past Surgical History:  Procedure Laterality Date   ABDOMINAL HYSTERECTOMY     bilateral cataract surgery     BREAST LUMPECTOMY Right 07/22/2021   CHOLECYSTECTOMY  12/04/1978   FRACTURE SURGERY Left 12/29/2018   FEMUR   INTRAMEDULLARY (IM) NAIL INTERTROCHANTERIC Right 09/21/2022   Procedure: INTRAMEDULLARY (IM) NAIL INTERTROCHANTERIC;  Surgeon: Marchia Drivers, MD;  Location: [facility] ORS;  Service: Orthopedics;  Laterality: Right;   JOINT REPLACEMENT     Bilateral knee   LAPAROSCOPIC SIGMOID COLECTOMY  12/04/2009   secondary to benign mass   MELANOMA EXCISION WITH SENTINEL LYMPH NODE BIOPSY Right 07/22/2021   Procedure: MELANOMA EXCISION WITH SENTINEL LYMPH NODE BIOPSY;  Surgeon: Rodolph,  Lucas, MD;  Location: [facility] ORS;  Service: General;  Laterality: Right;   PART MASTECTOMY,RADIO FREQUENCY LOCALIZER,AXILLARY SENTINEL NODE BIOPSY Right 07/22/2021   Procedure: PART MASTECTOMY,RADIO FREQUENCY LOCALIZER,AXILLARY SENTINEL NODE BIOPSY;  Surgeon: Rodolph Lucas, MD;  Location: [facility] ORS;  Service: General;  Laterality: Right;   REPLACEMENT TOTAL KNEE BILATERAL     SMALL INTESTINE SURGERY  12/04/2008   for SBO   SPINE SURGERY  12/04/2004   rods, [facility]   tendon repair     achille heall, left      Allergies  Allergen Reactions   Bactrim  [Sulfamethoxazole -Trimethoprim ] Nausea And Vomiting    Lethargic and weakness     Outpatient Encounter  Medications as of 10/02/2024  Medication Sig   acetaminophen  (TYLENOL ) 500 MG tablet Take 1,000 mg by mouth at bedtime.   apixaban  (ELIQUIS ) 2.5 MG TABS tablet Take 2.5 mg by mouth 2 (two) times daily.   Calcium Carb-Cholecalciferol (RA CALCIUM PLUS VITAMIN D3) 600-10 MG-MCG TABS Take 1 tablet by mouth 2 (two) times daily.   Cholecalciferol (VITAMIN D3) 50 MCG (2000 UT) TABS Take 1 tablet by mouth daily.   cyanocobalamin (VITAMIN B12) 1000 MCG tablet Take 1,000 mcg by mouth daily.   diltiazem  (CARDIZEM  CD) 120 MG 24 hr capsule Take 120 mg by mouth daily.   levothyroxine  (SYNTHROID ) 50 MCG tablet TAKE 1 TABLET EVERY DAY ON EMPTY STOMACHWITH A GLASS OF WATER  AT LEAST 30-60 MINBEFORE BREAKFAST   Magnesium  Oxide 250 MG TABS Take 1 tablet by mouth every other day. At bedtime   Multiple Vitamins-Minerals (PRESERVISION AREDS PO) Take 1 capsule by mouth 2 (two) times daily.   nystatin powder Apply 1 Application topically 2 (two) times daily. As needed   polyethylene glycol (MIRALAX  / GLYCOLAX ) 17 g packet Take 17 g by mouth every Monday, Wednesday, and Friday.   No facility-administered encounter medications on file as of 10/02/2024.     Review of Systems  Skin:  Positive for rash.       Itchy rash on her chest  All other systems reviewed and are negative.    Immunization History  Administered Date(s) Administered   INFLUENZA, HIGH DOSE SEASONAL PF 10/04/2019, 09/21/2020, 09/20/2022   Influenza Split 10/03/2014, 10/04/2015   Influenza-Unspecified 09/03/2013, 10/01/2016, 10/06/2017, 09/18/2018, 09/26/2023, 09/16/2024   Moderna Covid-19 Fall Seasonal Vaccine 38yrs & older 03/13/2023   Moderna Covid-19 Vaccine Bivalent Booster 62yrs & up 04/21/2021   Moderna Sars-Covid-2 Vaccination 12/22/2019, 01/19/2020, 10/19/2020, 08/08/2021, 10/13/2022   PNEUMOCOCCAL CONJUGATE-20 10/04/2022   Pneumococcal Conjugate-13 10/26/2014   Pneumococcal Polysaccharide-23 04/04/1993, 09/20/2008, 01/28/2016   Tdap  02/14/2013, 07/11/2023   Unspecified SARS-COV-2 Vaccination 08/31/2023, 03/14/2024   Zoster Recombinant(Shingrix) 02/07/2017, 06/18/2017   Zoster, Live 12/08/2009   Pertinent  Health Maintenance Due  Topic Date Due   Mammogram  06/08/2022   Influenza Vaccine  Completed   DEXA SCAN  Completed      09/25/2022    8:00 AM 09/27/2022    2:43 PM 03/09/2023    8:40 PM 07/10/2023    4:14 PM 07/22/2024   10:18 AM  Fall Risk  Falls in the past year?   1 1 0  Was there an injury with Fall?   1 1 0  Fall Risk Category Calculator   3 3 0  (RETIRED) Patient Fall Risk Level High fall risk  High fall risk      Patient at Risk for Falls Due to   History of fall(s);Impaired balance/gait;Impaired mobility;Mental status change;Impaired vision History of fall(s);Impaired  balance/gait;Impaired mobility   Fall risk Follow up   Falls evaluation completed       Data saved with a previous flowsheet row definition   Functional Status Survey:     Vitals:   10/02/24 0840  BP: 130/75  Pulse: 76  Resp: 18  Temp: (!) 97.5 F (36.4 C)  SpO2: 95%  Weight: 190 lb (86.2 kg)  Height: 5' 5 (1.651 m)   Body mass index is 31.62 kg/m. Physical Exam Skin:    General: Skin is warm and dry.     Findings: Rash present.     Comments: Papular rash in center of rash where her diamond pendant necklace rests on her skin. See picture.         Labs reviewed: Recent Labs    01/24/24 0000 02/25/24 0000 04/07/24 0000  NA 137 135* 135*  K 4.4 4.2 3.9  CL 101 98* 99  CO2 28* 30* 28*  BUN 17 16 23*  CREATININE 1.3* 1.3* 1.4*  CALCIUM 9.3 9.5 9.5   Recent Labs    04/07/24 0000  AST 16  ALT 11  ALKPHOS 60  ALBUMIN 3.7   Recent Labs    02/25/24 0000 04/07/24 0000 06/02/24 0000  WBC 7.0 8.4 7.4  NEUTROABS 4,039.00 5,620.00 4,507.00  HGB 11.6* 11.9* 11.5*  HCT 35* 35* 34*  PLT 265 277 248   Lab Results  Component Value Date   TSH 4.45 04/07/2024   Lab Results  Component Value Date   HGBA1C  5.6 04/07/2024   Lab Results  Component Value Date   CHOL 180 04/07/2024   HDL 48 04/07/2024   LDLCALC 102 04/07/2024   LDLDIRECT 132.0 07/12/2017   TRIG 180 (A) 04/07/2024   CHOLHDL 4.7 08/17/2021     Significant Diagnostic Results in last 30 days: No results found.   Assessment & Plan Seborrhea Keep her necklace off her neck. Start kenalog cream 0.1 % bid for 7 days.     Camellia Door, DO  [facility] [phone]

## 2024-10-06 LAB — BASIC METABOLIC PANEL WITH GFR
BUN: 21 (ref 4–21)
CO2: 10 — AB (ref 13–22)
Chloride: 99 (ref 99–108)
Creatinine: 1.6 — AB (ref 0.5–1.1)
Glucose: 116
Potassium: 4.3 meq/L (ref 3.5–5.1)
Sodium: 137 (ref 137–147)

## 2024-10-06 LAB — CBC AND DIFFERENTIAL
HCT: 37 (ref 36–46)
Hemoglobin: 12.3 (ref 12.0–16.0)
Neutrophils Absolute: 2926
Platelets: 297 K/uL (ref 150–400)
WBC: 5.5

## 2024-10-06 LAB — COMPREHENSIVE METABOLIC PANEL WITH GFR
Albumin: 3.8 (ref 3.5–5.0)
Globulin: 3.1
eGFR: 30

## 2024-10-06 LAB — HEMOGLOBIN A1C: Hemoglobin A1C: 5.6

## 2024-10-06 LAB — HEPATIC FUNCTION PANEL
ALT: 18 U/L (ref 7–35)
AST: 18 (ref 13–35)
Alkaline Phosphatase: 56 (ref 25–125)
Bilirubin, Total: 0.5

## 2024-10-06 LAB — CBC: RBC: 3.75 — AB (ref 3.87–5.11)

## 2024-10-16 ENCOUNTER — Non-Acute Institutional Stay (SKILLED_NURSING_FACILITY): Payer: Self-pay | Admitting: Internal Medicine

## 2024-10-16 ENCOUNTER — Encounter: Payer: Self-pay | Admitting: Internal Medicine

## 2024-10-16 DIAGNOSIS — I480 Paroxysmal atrial fibrillation: Secondary | ICD-10-CM

## 2024-10-16 DIAGNOSIS — R21 Rash and other nonspecific skin eruption: Secondary | ICD-10-CM

## 2024-10-16 DIAGNOSIS — I10 Essential (primary) hypertension: Secondary | ICD-10-CM

## 2024-10-16 DIAGNOSIS — N184 Chronic kidney disease, stage 4 (severe): Secondary | ICD-10-CM

## 2024-10-16 DIAGNOSIS — D6869 Other thrombophilia: Secondary | ICD-10-CM

## 2024-10-16 DIAGNOSIS — F03B0 Unspecified dementia, moderate, without behavioral disturbance, psychotic disturbance, mood disturbance, and anxiety: Secondary | ICD-10-CM | POA: Diagnosis not present

## 2024-10-16 NOTE — Assessment & Plan Note (Addendum)
 Start hydrocortisone cream 1% applied to the rash around her neckline twice a day for 7 days.  Rash is not vesicular.  Crosses the midline.  This is not shingles.

## 2024-10-16 NOTE — Assessment & Plan Note (Signed)
 Stable.  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  She is not on any medications for this.  She seems to be still very interested in politics.  Not on any antidepressants or any antipsychotics.

## 2024-10-16 NOTE — Progress Notes (Signed)
 [facility] SN[facility] Routine Visit Progress Note    Location:  Other [facility].  Nursing Home Room Number: Learta Beagle [id number] Place of Service:  SNF (31)   PCP: Laurence Locus, DO   Patient Care Team: Laurence Locus, DO as PCP - General (Internal Medicine) [name], MD as PCP - Cardiology (Cardiology) [name], RN (Inactive) as Oncology Nurse Navigator Caro Harlene POUR, NP as Nurse Practitioner (Geriatric Medicine)   Extended Emergency Contact Information Primary Emergency Contact: Sans,david Address: [address]          [place], [place] [address] [place] Home Phone: [phone] Mobile Phone: [phone] Relation: Son   Goals of care: Advanced Directive information    09/11/2024    4:09 PM  Advanced Directives  Does Patient Have a Medical Advance Directive? Yes  Type of Estate Agent of [place];Living will;Out of facility DNR (pink MOST or yellow form)  Does patient want to make changes to medical advance directive? No - Patient declined  Copy of Healthcare Power of Attorney in Chart? Yes - validated most recent copy scanned in chart (See row information)  Pre-existing out of facility DNR order (yellow form or pink MOST form) Yellow form placed in chart (order not valid for inpatient use)    CODE STATUS: Do Not Resuscitate (DNR)   Chief Complaint  Patient presents with   Medical Management of Chronic Issues    Medical Management of Chronic Issues      HPI: Pt is a 89 y.o. female seen today for medical management of chronic disease.    Patient is a 89 year old female with a history of CKD stage IV, history of hypertension, paroxysmal A-fib on Eliquis , macular degeneration of her eyes, mild to moderate dementia, prior history of left renal mass that is not being worked up that is not being worked up, who is seen for routine medical care.  Patient complains of an itchy rash that is on her neck.  She did get some Benadryl cream  which helped some.  Patient spends her time listening to audiobooks as her vision is quite poor.  She has a large family.  She has had 5 children.  3 boys and 2 girls.  She has 6 grandchildren.  She has at least 2 great-grandchildren.  She has been here at [facility] for about 20 years.  Her husband is deceased.  She is now living in [facility] skilled nursing is a long-term care resident.  She is a DNR.  Patient has no other health concerns today.   Past Medical History:  Diagnosis Date   Breast cancer (HCC)    CKD (chronic kidney disease) stage 3, GFR 30-59 ml/min (HCC)    Displaced intertrochanteric fracture of right femur (HCC)    Per [facility] EMR, [employer] Care   Former smoker    Heart disease    mitral valvular   Hyperlipidemia    Hyperparathyroidism, secondary    Hypertension    Hypo-osmolality and hyponatremia    Per [employer] EMR, [employer] Care   Hypothyroidism    Macular degeneration    Per [employer] EMR, [employer] Care   Sensorineural hearing loss    Spinal stenosis of lumbar region 2006   s/p surgery   UTI (urinary tract infection)    Per [employer] EMR, [employer] Care   Vitamin D  deficiency    Per [employer] EMR, [employer] Care   Past Surgical  History:  Procedure Laterality Date   ABDOMINAL HYSTERECTOMY     bilateral cataract surgery     BREAST LUMPECTOMY Right 07/22/2021   CHOLECYSTECTOMY  12/04/1978   FRACTURE SURGERY Left 12/29/2018   FEMUR   INTRAMEDULLARY (IM) NAIL INTERTROCHANTERIC Right 09/21/2022   Procedure: INTRAMEDULLARY (IM) NAIL INTERTROCHANTERIC;  Surgeon: Marchia Drivers, MD;  Location: [facility] ORS;  Service: Orthopedics;  Laterality: Right;   JOINT REPLACEMENT     Bilateral knee   LAPAROSCOPIC SIGMOID COLECTOMY  12/04/2009   secondary to benign mass   MELANOMA EXCISION WITH SENTINEL LYMPH NODE BIOPSY Right 07/22/2021   Procedure: MELANOMA EXCISION WITH SENTINEL LYMPH NODE BIOPSY;  Surgeon: Rodolph Romano, MD;   Location: [facility] ORS;  Service: General;  Laterality: Right;   PART MASTECTOMY,RADIO FREQUENCY LOCALIZER,AXILLARY SENTINEL NODE BIOPSY Right 07/22/2021   Procedure: PART MASTECTOMY,RADIO FREQUENCY LOCALIZER,AXILLARY SENTINEL NODE BIOPSY;  Surgeon: Rodolph Romano, MD;  Location: [facility] ORS;  Service: General;  Laterality: Right;   REPLACEMENT TOTAL KNEE BILATERAL     SMALL INTESTINE SURGERY  12/04/2008   for SBO   SPINE SURGERY  12/04/2004   [facility], [facility]   tendon repair     achille heall, left      Allergies  Allergen Reactions   Bactrim  [Sulfamethoxazole -Trimethoprim ] Nausea And Vomiting    Lethargic and weakness     Outpatient Encounter Medications as of 10/16/2024  Medication Sig   acetaminophen  (TYLENOL ) 500 MG tablet Take 1,000 mg by mouth at bedtime.   apixaban  (ELIQUIS ) 2.5 MG TABS tablet Take 2.5 mg by mouth 2 (two) times daily.   Calcium Carb-Cholecalciferol (RA CALCIUM PLUS VITAMIN D3) 600-10 MG-MCG TABS Take 1 tablet by mouth 2 (two) times daily.   Cholecalciferol (VITAMIN D3) 50 MCG (2000 UT) TABS Take 1 tablet by mouth daily.   cyanocobalamin (VITAMIN B12) 1000 MCG tablet Take 1,000 mcg by mouth daily.   diltiazem  (CARDIZEM  CD) 120 MG 24 hr capsule Take 120 mg by mouth daily.   levothyroxine  (SYNTHROID ) 50 MCG tablet TAKE 1 TABLET EVERY DAY ON EMPTY STOMACHWITH A GLASS OF WATER  AT LEAST 30-60 MINBEFORE BREAKFAST   Magnesium  Oxide 250 MG TABS Take 1 tablet by mouth every other day. At bedtime   Multiple Vitamins-Minerals (PRESERVISION AREDS PO) Take 1 capsule by mouth 2 (two) times daily.   nystatin powder Apply 1 Application topically 2 (two) times daily. As needed   polyethylene glycol (MIRALAX  / GLYCOLAX ) 17 g packet Take 17 g by mouth every Monday, Wednesday, and Friday.   No facility-administered encounter medications on file as of 10/16/2024.     Review of Systems  Skin:        Recent itchy rash around her neck over the last day.  All other systems reviewed  and are negative.     Immunization History  Administered Date(s) Administered   INFLUENZA, HIGH DOSE SEASONAL PF 10/04/2019, 09/21/2020, 09/20/2022   Influenza Split 10/03/2014, 10/04/2015   Influenza-Unspecified 09/03/2013, 10/01/2016, 10/06/2017, 09/18/2018, 09/26/2023, 09/16/2024   Moderna Covid-19 Fall Seasonal Vaccine 51yrs & older 03/13/2023   Moderna Covid-19 Vaccine Bivalent Booster 37yrs & up 04/21/2021   Moderna Sars-Covid-2 Vaccination 12/22/2019, 01/19/2020, 10/19/2020, 08/08/2021, 10/13/2022   PNEUMOCOCCAL CONJUGATE-20 10/04/2022   Pneumococcal Conjugate-13 10/26/2014   Pneumococcal Polysaccharide-23 04/04/1993, 09/20/2008, 01/28/2016   Tdap 02/14/2013, 07/11/2023   Unspecified SARS-COV-2 Vaccination 08/31/2023, 03/14/2024   Zoster Recombinant(Shingrix) 02/07/2017, 06/18/2017   Zoster, Live 12/08/2009   Pertinent  Health Maintenance Due  Topic Date Due   Mammogram  06/08/2022  Influenza Vaccine  Completed   DEXA SCAN  Completed      09/25/2022    8:00 AM 09/27/2022    2:43 PM 03/09/2023    8:40 PM 07/10/2023    4:14 PM 07/22/2024   10:18 AM  Fall Risk  Falls in the past year?   1 1 0  Was there an injury with Fall?   1 1 0  Fall Risk Category Calculator   3 3 0  (RETIRED) Patient Fall Risk Level High fall risk  High fall risk      Patient at Risk for Falls Due to   History of fall(s);Impaired balance/gait;Impaired mobility;Mental status change;Impaired vision History of fall(s);Impaired balance/gait;Impaired mobility   Fall risk Follow up   Falls evaluation completed       Data saved with a previous flowsheet row definition   Functional Status Survey:     Vitals:   10/16/24 1527  BP: 136/72  Pulse: 72  Resp: 18  Temp: (!) 97.3 F (36.3 C)  SpO2: 96%  Weight: 189 lb 9.6 oz (86 kg)  Height: 5' 5 (1.651 m)   Body mass index is 31.55 kg/m. Physical Exam Vitals and nursing note reviewed.  Constitutional:      General: She is not in acute distress.     Appearance: She is normal weight. She is not toxic-appearing or diaphoretic.     Comments: Appears younger than stated age of 89 years old.  She still very alert and oriented x 4.  She was watching the news video on the reopening of the government after the 40-day government shutdown.  HENT:     Head: Normocephalic and atraumatic.     Nose: Nose normal.  Eyes:     General: No scleral icterus. Cardiovascular:     Rate and Rhythm: Normal rate and regular rhythm.  Pulmonary:     Effort: Pulmonary effort is normal.     Breath sounds: Normal breath sounds.  Abdominal:     General: Bowel sounds are normal. There is no distension.     Palpations: Abdomen is soft.     Tenderness: There is no abdominal tenderness.  Skin:    General: Skin is warm and dry.     Capillary Refill: Capillary refill takes less than 2 seconds.     Comments: Mild excoriations around the neck line where her sweater lies.  She states that the itchiness started before she was wearing her sweater today.  Her sweater seems to be made of [facility].  Neurological:     Mental Status: She is alert and oriented to person, place, and time.     Comments: Decreased vision out of both eyes.  This is chronic for her.      Labs reviewed: Recent Labs    01/24/24 0000 02/25/24 0000 04/07/24 0000 10/06/24 0000  NA 137 135* 135* 137  K 4.4 4.2 3.9 4.3  CL 101 98* 99 99  CO2 28* 30* 28* 10*  BUN 17 16 23* 21  CREATININE 1.3* 1.3* 1.4* 1.6*  CALCIUM 9.3 9.5 9.5  --    Recent Labs    04/07/24 0000 10/06/24 0000  AST 16 18  ALT 11 18  ALKPHOS 60 56  ALBUMIN 3.7 3.8   Recent Labs    04/07/24 0000 06/02/24 0000 10/06/24 0000  WBC 8.4 7.4 5.5  NEUTROABS 5,620.00 4,507.00 2,926.00  HGB 11.9* 11.5* 12.3  HCT 35* 34* 37  PLT 277 248 297   Lab Results  Component Value Date   TSH 4.45 04/07/2024   Lab Results  Component Value Date   HGBA1C 5.6 10/06/2024   Lab Results  Component Value Date   CHOL 180 04/07/2024    HDL 48 04/07/2024   LDLCALC 102 04/07/2024   LDLDIRECT 132.0 07/12/2017   TRIG 180 (A) 04/07/2024   CHOLHDL 4.7 08/17/2021      Assessment/Plan  Acquired thrombophilia Due to her A-fib.  Continue with Eliquis  2.5 mg twice daily.  Rash of neck Start hydrocortisone cream 1% applied to the rash around her neckline twice a day for 7 days.  Rash is not vesicular.  Crosses the midline.  This is not shingles.  CKD (chronic kidney disease), stage IV (HCC) Labs from 10/06/2024.  BUN of 21, creatinine 1.6.  This is around her baseline.  Essential hypertension Continue with Cardizem  CD 120 mg daily.  PAF (paroxysmal atrial fibrillation) (HCC) Continue with Eliquis  2.5 mg twice daily, Cardizem  CD to 40 mg daily.  She is also on magnesium  oxide 2 to 50 mg every other day  Moderate dementia without behavioral disturbance, psychotic disturbance, mood disturbance, or anxiety, unspecified dementia type (HCC) Stable.  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  She is not on any medications for this.  She seems to be still very interested in politics.  Not on any antidepressants or any antipsychotics.  Acquired hypothyroidism Continue Synthroid  50 mcg daily.  Last TSH in May 2025 4.45.    Camellia Door, DO [facility] [phone]

## 2024-10-16 NOTE — Assessment & Plan Note (Signed)
 Continue with Cardizem  CD 120 mg daily.

## 2024-10-16 NOTE — Assessment & Plan Note (Signed)
 Labs from 10/06/2024.  BUN of 21, creatinine 1.6.  This is around her baseline.

## 2024-10-16 NOTE — Assessment & Plan Note (Signed)
 Continue with Eliquis  2.5 mg twice daily, Cardizem  CD to 40 mg daily.  She is also on magnesium  oxide 2 to 50 mg every other day

## 2024-10-16 NOTE — Assessment & Plan Note (Signed)
 Continue Synthroid  50 mcg daily.  Last TSH in May 2025 4.45.

## 2024-10-16 NOTE — Assessment & Plan Note (Signed)
 Due to her A-fib.  Continue with Eliquis  2.5 mg twice daily.

## 2024-11-09 ENCOUNTER — Emergency Department

## 2024-11-09 ENCOUNTER — Emergency Department
Admission: EM | Admit: 2024-11-09 | Discharge: 2024-11-09 | Disposition: A | Discharge status: Skilled Nursing Facility | Attending: Emergency Medicine | Admitting: Emergency Medicine

## 2024-11-09 ENCOUNTER — Other Ambulatory Visit: Payer: Self-pay

## 2024-11-09 DIAGNOSIS — I129 Hypertensive chronic kidney disease with stage 1 through stage 4 chronic kidney disease, or unspecified chronic kidney disease: Secondary | ICD-10-CM | POA: Diagnosis not present

## 2024-11-09 DIAGNOSIS — Z87891 Personal history of nicotine dependence: Secondary | ICD-10-CM | POA: Diagnosis not present

## 2024-11-09 DIAGNOSIS — N183 Chronic kidney disease, stage 3 unspecified: Secondary | ICD-10-CM | POA: Diagnosis not present

## 2024-11-09 DIAGNOSIS — Z853 Personal history of malignant neoplasm of breast: Secondary | ICD-10-CM | POA: Diagnosis not present

## 2024-11-09 DIAGNOSIS — F0390 Unspecified dementia without behavioral disturbance: Secondary | ICD-10-CM | POA: Diagnosis not present

## 2024-11-09 DIAGNOSIS — E039 Hypothyroidism, unspecified: Secondary | ICD-10-CM | POA: Diagnosis not present

## 2024-11-09 DIAGNOSIS — R0602 Shortness of breath: Secondary | ICD-10-CM | POA: Diagnosis not present

## 2024-11-09 DIAGNOSIS — Z96653 Presence of artificial knee joint, bilateral: Secondary | ICD-10-CM | POA: Diagnosis not present

## 2024-11-09 DIAGNOSIS — R059 Cough, unspecified: Secondary | ICD-10-CM | POA: Diagnosis present

## 2024-11-09 DIAGNOSIS — J111 Influenza due to unidentified influenza virus with other respiratory manifestations: Secondary | ICD-10-CM

## 2024-11-09 DIAGNOSIS — J101 Influenza due to other identified influenza virus with other respiratory manifestations: Secondary | ICD-10-CM | POA: Diagnosis not present

## 2024-11-09 LAB — CBC
HCT: 38.5 % (ref 36.0–46.0)
Hemoglobin: 13.1 g/dL (ref 12.0–15.0)
MCH: 33.2 pg (ref 26.0–34.0)
MCHC: 34 g/dL (ref 30.0–36.0)
MCV: 97.5 fL (ref 80.0–100.0)
Platelets: 294 K/uL (ref 150–400)
RBC: 3.95 MIL/uL (ref 3.87–5.11)
RDW: 12.1 % (ref 11.5–15.5)
WBC: 6.4 K/uL (ref 4.0–10.5)
nRBC: 0 % (ref 0.0–0.2)

## 2024-11-09 LAB — BASIC METABOLIC PANEL WITH GFR
Anion gap: 12 (ref 5–15)
BUN: 16 mg/dL (ref 8–23)
CO2: 27 mmol/L (ref 22–32)
Calcium: 9.7 mg/dL (ref 8.9–10.3)
Chloride: 92 mmol/L — ABNORMAL LOW (ref 98–111)
Creatinine, Ser: 1.37 mg/dL — ABNORMAL HIGH (ref 0.44–1.00)
GFR, Estimated: 35 mL/min — ABNORMAL LOW (ref >60)
Glucose, Bld: 96 mg/dL (ref 70–99)
Potassium: 4.4 mmol/L (ref 3.5–5.1)
Sodium: 132 mmol/L — ABNORMAL LOW (ref 135–145)

## 2024-11-09 LAB — TROPONIN T, HIGH SENSITIVITY: Troponin T High Sensitivity: 20 ng/L — ABNORMAL HIGH (ref 0–19)

## 2024-11-09 LAB — RESP PANEL BY RT-PCR (RSV, FLU A&B, COVID)  RVPGX2
Influenza A by PCR: POSITIVE — AB
Influenza B by PCR: NEGATIVE
Resp Syncytial Virus by PCR: NEGATIVE
SARS Coronavirus 2 by RT PCR: NEGATIVE

## 2024-11-09 LAB — PROTIME-INR
INR: 1.1 (ref 0.8–1.2)
Prothrombin Time: 14.3 s (ref 11.4–15.2)

## 2024-11-09 MED ORDER — BENZONATATE 100 MG PO CAPS: 100.0000 mg | ORAL_CAPSULE | Freq: Three times a day (TID) | ORAL | 0 refills | Status: DC | PRN

## 2024-11-09 MED ORDER — OSELTAMIVIR PHOSPHATE 30 MG PO CAPS: 30.0000 mg | ORAL_CAPSULE | Freq: Two times a day (BID) | ORAL | 0 refills | Status: DC

## 2024-11-09 MED ORDER — OSELTAMIVIR PHOSPHATE 75 MG PO CAPS: 75.0000 mg | ORAL_CAPSULE | Freq: Once | ORAL | Status: DC

## 2024-11-09 MED ORDER — ALBUTEROL SULFATE (2.5 MG/3ML) 0.083% IN NEBU
5.0000 mg | INHALATION_SOLUTION | Freq: Once | RESPIRATORY_TRACT | Status: AC
  Administered 2024-11-09: 5 mg via RESPIRATORY_TRACT
  Filled 2024-11-09: qty 6

## 2024-11-09 MED ORDER — GUAIFENESIN 100 MG/5ML PO LIQD
5.0000 mL | Freq: Once | ORAL | Status: AC
  Administered 2024-11-09: 5 mL via ORAL
  Filled 2024-11-09: qty 10

## 2024-11-09 MED ORDER — OSELTAMIVIR PHOSPHATE 30 MG PO CAPS
30.0000 mg | ORAL_CAPSULE | Freq: Once | ORAL | Status: AC
  Administered 2024-11-09: 30 mg via ORAL
  Filled 2024-11-09: qty 1

## 2024-11-09 MED ORDER — BENZONATATE 100 MG PO CAPS
200.0000 mg | ORAL_CAPSULE | Freq: Once | ORAL | Status: AC
  Administered 2024-11-09: 200 mg via ORAL
  Filled 2024-11-09: qty 2

## 2024-11-09 MED ORDER — GUAIFENESIN 100 MG/5ML PO LIQD: 5.0000 mL | ORAL | 0 refills | Status: AC | PRN

## 2024-11-09 NOTE — ED Provider Triage Note (Signed)
 Emergency Medicine Provider Triage Evaluation Note  Pamela Paul , a 89 y.o. female  was evaluated in triage.  Pt complains of shortness of breath and cough x 3-4 days. No chest pain. No fever.  Physical Exam  There were no vitals taken for this visit. Gen:   Awake, no distress   Resp:  Normal effort  MSK:   Moves extremities without difficulty  Other:    Medical Decision Making  Medically screening exam initiated at 5:35 PM.  Appropriate orders placed.  Pamela Paul was informed that the remainder of the evaluation will be completed by another provider, this initial triage assessment does not replace that evaluation, and the importance of remaining in the ED until their evaluation is complete.    Herlinda Kirk NOVAK, FNP 11/09/24 2352

## 2024-11-09 NOTE — ED Provider Notes (Signed)
 [facility] Provider Note    Event Date/Time   First MD Initiated Contact with Patient 11/09/24 1824     (approximate)   History   Chief Complaint: Shortness of Breath   HPI  Pamela Paul is a 89 y.o. female with a history of hypertension, CKD, paroxysmal atrial fibrillation, dementia who comes to the ED from [facility] due to shortness of breath and nonproductive cough for the past 2 days.  Also has fatigue, decreased appetite and oral intake.  Denies pain.        Past Medical History:  Diagnosis Date   Breast cancer (HCC)    CKD (chronic kidney disease) stage 3, GFR 30-59 ml/min (HCC)    Displaced intertrochanteric fracture of right femur (HCC)    Per [facility] EMR, [employer] Care   Former smoker    Heart disease    mitral valvular   Hyperlipidemia    Hyperparathyroidism, secondary    Hypertension    Hypo-osmolality and hyponatremia    Per [facility] EMR, [employer] Care   Hypothyroidism    Macular degeneration    Per [facility] EMR, [employer] Care   Sensorineural hearing loss    Spinal stenosis of lumbar region 2006   s/p surgery   UTI (urinary tract infection)    Per [facility] EMR, [employer] Care   Vitamin D  deficiency    Per [facility] EMR, [employer] Care    Current Outpatient Rx   Order #: [id number] Class: Print   Order #: [id number] Class: Print   Order #: [id number] Class: Print   Order #: [id number] Class: Historical Med   Order #: [id number] Class: Historical Med   Order #: [id number] Class: Historical Med   Order #: [id number] Class: Historical Med   Order #: [id number] Class: Historical Med   Order #: [id number] Class: Historical Med   Order #: [id number] Class: Normal   Order #: [id number] Class: Historical Med   Order #: [id number] Class: Historical Med   Order #: [id number] Class: Historical Med   Order #: [id number] Class: Historical Med    Past Surgical History:  Procedure Laterality Date   ABDOMINAL HYSTERECTOMY      bilateral cataract surgery     BREAST LUMPECTOMY Right 07/22/2021   CHOLECYSTECTOMY  12/04/1978   FRACTURE SURGERY Left 12/29/2018   FEMUR   INTRAMEDULLARY (IM) NAIL INTERTROCHANTERIC Right 09/21/2022   Procedure: INTRAMEDULLARY (IM) NAIL INTERTROCHANTERIC;  Surgeon: Marchia Drivers, MD;  Location: [facility] ORS;  Service: Orthopedics;  Laterality: Right;   JOINT REPLACEMENT     Bilateral knee   LAPAROSCOPIC SIGMOID COLECTOMY  12/04/2009   secondary to benign mass   MELANOMA EXCISION WITH SENTINEL LYMPH NODE BIOPSY Right 07/22/2021   Procedure: MELANOMA EXCISION WITH SENTINEL LYMPH NODE BIOPSY;  Surgeon: Rodolph Romano, MD;  Location: [facility] ORS;  Service: General;  Laterality: Right;   PART MASTECTOMY,RADIO FREQUENCY LOCALIZER,AXILLARY SENTINEL NODE BIOPSY Right 07/22/2021   Procedure: PART MASTECTOMY,RADIO FREQUENCY LOCALIZER,AXILLARY SENTINEL NODE BIOPSY;  Surgeon: Rodolph Romano, MD;  Location: [facility] ORS;  Service: General;  Laterality: Right;   REPLACEMENT TOTAL KNEE BILATERAL     SMALL INTESTINE SURGERY  12/04/2008   for SBO   SPINE SURGERY  12/04/2004   rods, [facility]   tendon repair     achille heall, left     Physical Exam   Triage Vital Signs: ED Triage Vitals  Encounter Vitals Group     BP 11/09/24 1742 (!) 170/84     Girls Systolic BP Percentile --  Girls Diastolic BP Percentile --      Boys Systolic BP Percentile --      Boys Diastolic BP Percentile --      Pulse Rate 11/09/24 1742 96     Resp 11/09/24 1742 19     Temp 11/09/24 1742 98.1 F (36.7 C)     Temp Source 11/09/24 2042 Oral     SpO2 11/09/24 1742 100 %     Weight 11/09/24 1737 189 lb 9.5 oz (86 kg)     Height 11/09/24 1737 5' 5 (1.651 m)     Head Circumference --      Peak Flow --      Pain Score 11/09/24 1737 0     Pain Loc --      Pain Education --      Exclude from Growth Chart --     Most recent vital signs: Vitals:   11/09/24 1936 11/09/24 2042  BP:  136/86  Pulse:  (!) 113   Resp:  20  Temp:  99.9 F (37.7 C)  SpO2: 98% 96%    General: Awake, no distress.  CV:  Good peripheral perfusion.  Regular rate rhythm Resp:  Normal effort.  Mild expiratory wheezing.  No focal crackles. Abd:  No distention.  Soft nontender Other:  Moist oral mucosa   ED Results / Procedures / Treatments   Labs (all labs ordered are listed, but only abnormal results are displayed) Labs Reviewed  RESP PANEL BY RT-PCR (RSV, FLU A&B, COVID)  [id number] - Abnormal; Notable for the following components:      Result Value   Influenza A by PCR POSITIVE (*)    All other components within normal limits  BASIC METABOLIC PANEL WITH GFR - Abnormal; Notable for the following components:   Sodium 132 (*)    Chloride 92 (*)    Creatinine, Ser 1.37 (*)    GFR, Estimated 35 (*)    All other components within normal limits  TROPONIN T, HIGH SENSITIVITY - Abnormal; Notable for the following components:   Troponin T High Sensitivity 20 (*)    All other components within normal limits  CBC  PROTIME-INR     EKG Interpreted by me Sinus tachycardia rate 107.  Normal axis, right bundle branch block, no acute ischemic changes.   RADIOLOGY Chest x-ray interpreted by me, unremarkable.  Radiology report reviewed   PROCEDURES:  Procedures   MEDICATIONS ORDERED IN ED: Medications  albuterol  (PROVENTIL ) (2.5 MG/3ML) 0.083% nebulizer solution 5 mg (5 mg Nebulization Given 11/09/24 1842)  benzonatate  (TESSALON ) capsule 200 mg (200 mg Oral Given 11/09/24 2045)  guaiFENesin  (ROBITUSSIN) 100 MG/5ML liquid 5 mL (5 mLs Oral Given 11/09/24 2046)  oseltamivir  (TAMIFLU ) capsule 30 mg (30 mg Oral Given 11/09/24 2046)     IMPRESSION / MDM / ASSESSMENT AND PLAN / ED COURSE  I reviewed the triage vital signs and the nursing notes.  DDx: COVID, influenza, pneumonia, NSTEMI, AKI, electrolyte derangement, anemia  Patient's presentation is most consistent with acute presentation with potential threat to  life or bodily function.  Patient presents with respiratory illness, has some wheezing, will give bronchodilators while obtaining labs, chest x-ray   Clinical Course as of 11/09/24 2322  Sun Nov 09, 2024  2005 Patient feels better.  Test reveal influenza A positive.  Chest x-ray unremarkable.  Will give Tamiflu , supportive care.  Patient and family are comfortable with discharge back to [facility] skilled nursing facility. [PS]    Clinical Course  User Index [PS] Viviann Pastor, MD     FINAL CLINICAL IMPRESSION(S) / ED DIAGNOSES   Final diagnoses:  Influenza  Chronic dementia (HCC)     Rx / DC Orders   ED Discharge Orders          Ordered    guaiFENesin  (ROBITUSSIN) 100 MG/5ML liquid  Every 4 hours PRN        11/09/24 2005    benzonatate  (TESSALON ) 100 MG capsule  3 times daily PRN        11/09/24 2005    oseltamivir  (TAMIFLU ) 30 MG capsule  2 times daily        11/09/24 2005             Note:  This document was prepared using Dragon voice recognition software and may include unintentional dictation errors.   Viviann Pastor, MD 11/09/24 2322

## 2024-11-09 NOTE — ED Triage Notes (Signed)
 Pt comes via EMS from [facility] with c/o sob. Pt states this started three days ago. Pt has cough and some wheezing. Pt 96 % on RA. Pt placed on 3L by EMS and at 100%.  CBG 112

## 2024-11-10 ENCOUNTER — Encounter: Payer: Self-pay | Admitting: Orthopedic Surgery

## 2024-11-10 ENCOUNTER — Non-Acute Institutional Stay (SKILLED_NURSING_FACILITY): Admitting: Orthopedic Surgery

## 2024-11-10 DIAGNOSIS — I10 Essential (primary) hypertension: Secondary | ICD-10-CM | POA: Diagnosis not present

## 2024-11-10 DIAGNOSIS — J101 Influenza due to other identified influenza virus with other respiratory manifestations: Secondary | ICD-10-CM | POA: Diagnosis not present

## 2024-11-10 DIAGNOSIS — R062 Wheezing: Secondary | ICD-10-CM

## 2024-11-10 DIAGNOSIS — I480 Paroxysmal atrial fibrillation: Secondary | ICD-10-CM

## 2024-11-10 DIAGNOSIS — F03B0 Unspecified dementia, moderate, without behavioral disturbance, psychotic disturbance, mood disturbance, and anxiety: Secondary | ICD-10-CM

## 2024-11-10 NOTE — Progress Notes (Signed)
 Location:  Other Nursing Home Room Number: [facility] Place of Service:  SNF (31) Provider:  Greig FORBES Cluster, NP   Laurence Locus, DO  Patient Care Team: Laurence Locus, DO as PCP - General (Internal Medicine) [name], MD as PCP - Cardiology (Cardiology) [name], RN (Inactive) as Oncology Nurse Navigator Caro Harlene POUR, NP as Nurse Practitioner (Geriatric Medicine)  Extended Emergency Contact Information Primary Emergency Contact: Sassaman,david Address: [address]          [place], [place] [address] [place] Home Phone: [phone] Mobile Phone: [phone] Relation: Son  Code Status:  DNR Goals of care: Advanced Directive information    11/09/2024    5:44 PM  Advanced Directives  Does Patient Have a Medical Advance Directive? Yes     Chief Complaint  Patient presents with   Hospitalization Follow-up    HPI:  Pt is a 89 y.o. female seen today for f/u s/p ED visit 11/09/2024.   She currently resides on the skilled nursing unit at [facility]. PMH: HTN, PAF, hypothyroidism, secondary hyperparathyroidism, dementia, spondylolisthesis, CKD, thrombophilia, right breast cancer 2022, left renal mass and constipation.   12/07 she had temperature of 99.9, shortness of breath and decreased appetite. She was sent to ED for evaluation. CXR was negative for acute process. She tested positive for influenza A. She was prescribed tamiflu  and returned back to SNF.   Today, she reports feeling tired. Vitals stable. Wheezing on exam. Appetite still poor. Family/nursing are going to offer more soups fluids more often.    Past Medical History:  Diagnosis Date   Breast cancer (HCC)    CKD (chronic kidney disease) stage 3, GFR 30-59 ml/min (HCC)    Displaced intertrochanteric fracture of right femur (HCC)    Per [facility]MR, [employer] Care   Former smoker    Heart disease    mitral valvular   Hyperlipidemia    Hyperparathyroidism, secondary     Hypertension    Hypo-osmolality and hyponatremia    Per [employer] EMR, [employer] Care   Hypothyroidism    Macular degeneration    Per [employer] EMR, [employer] [facility]   Sensorineural hearing loss    Spinal stenosis of lumbar region 2006   s/p surgery   UTI (urinary tract infection)    Per [facility] EMR, [employer] Care   Vitamin D  deficiency    Per [facility] EMR, [employer] Care   Past Surgical History:  Procedure Laterality Date   ABDOMINAL HYSTERECTOMY     bilateral cataract surgery     BREAST LUMPECTOMY Right 07/22/2021   CHOLECYSTECTOMY  12/04/1978   FRACTURE SURGERY Left 12/29/2018   FEMUR   INTRAMEDULLARY (IM) NAIL INTERTROCHANTERIC Right 09/21/2022   Procedure: INTRAMEDULLARY (IM) NAIL INTERTROCHANTERIC;  Surgeon: Marchia Drivers, MD;  Location: [facility] ORS;  Service: Orthopedics;  Laterality: Right;   JOINT REPLACEMENT     Bilateral knee   LAPAROSCOPIC SIGMOID COLECTOMY  12/04/2009   secondary to benign mass   MELANOMA EXCISION WITH SENTINEL LYMPH NODE BIOPSY Right 07/22/2021   Procedure: MELANOMA EXCISION WITH SENTINEL LYMPH NODE BIOPSY;  Surgeon: Rodolph Romano, MD;  Location: [facility] ORS;  Service: General;  Laterality: Right;   PART MASTECTOMY,RADIO FREQUENCY LOCALIZER,AXILLARY SENTINEL NODE BIOPSY Right 07/22/2021   Procedure: PART MASTECTOMY,RADIO FREQUENCY LOCALIZER,AXILLARY SENTINEL NODE BIOPSY;  Surgeon: Rodolph Romano, MD;  Location: [facility] ORS;  Service: General;  Laterality: Right;   REPLACEMENT TOTAL KNEE BILATERAL  SMALL INTESTINE SURGERY  12/04/2008   for SBO   SPINE SURGERY  12/04/2004   rods, [facility]   tendon repair     achille heall, left     Allergies  Allergen Reactions   Bactrim  [Sulfamethoxazole -Trimethoprim ] Nausea And Vomiting    Lethargic and weakness    Outpatient Encounter Medications as of 11/10/2024  Medication Sig   acetaminophen  (TYLENOL ) 500 MG tablet Take 1,000 mg by mouth at bedtime.   apixaban  (ELIQUIS ) 2.5  MG TABS tablet Take 2.5 mg by mouth 2 (two) times daily.   benzonatate  (TESSALON ) 100 MG capsule Take 1 capsule (100 mg total) by mouth 3 (three) times daily as needed for cough.   Calcium Carb-Cholecalciferol (RA CALCIUM PLUS VITAMIN D3) 600-10 MG-MCG TABS Take 1 tablet by mouth 2 (two) times daily.   Cholecalciferol (VITAMIN D3) 50 MCG (2000 UT) TABS Take 1 tablet by mouth daily.   cyanocobalamin (VITAMIN B12) 1000 MCG tablet Take 1,000 mcg by mouth daily.   diltiazem  (CARDIZEM  CD) 120 MG 24 hr capsule Take 120 mg by mouth daily.   guaiFENesin  (ROBITUSSIN) 100 MG/5ML liquid Take 5 mLs by mouth every 4 (four) hours as needed for up to 5 days for cough or to loosen phlegm.   levothyroxine  (SYNTHROID ) 50 MCG tablet TAKE 1 TABLET EVERY DAY ON EMPTY STOMACHWITH A GLASS OF WATER  AT LEAST 30-60 MINBEFORE BREAKFAST   Magnesium  Oxide 250 MG TABS Take 1 tablet by mouth every other day. At bedtime   Multiple Vitamins-Minerals (PRESERVISION AREDS PO) Take 1 capsule by mouth 2 (two) times daily.   nystatin powder Apply 1 Application topically 2 (two) times daily. As needed   oseltamivir  (TAMIFLU ) 30 MG capsule Take 1 capsule (30 mg total) by mouth 2 (two) times daily for 5 days.   polyethylene glycol (MIRALAX  / GLYCOLAX ) 17 g packet Take 17 g by mouth every Monday, Wednesday, and Friday.   No facility-administered encounter medications on file as of 11/10/2024.    Review of Systems  Constitutional:  Positive for fatigue and fever.  HENT:  Negative for sinus pressure and sore throat.   Eyes:  Negative for visual disturbance.  Respiratory:  Positive for shortness of breath and wheezing. Negative for cough.   Cardiovascular:  Negative for chest pain and leg swelling.  Gastrointestinal:  Negative for abdominal distention and abdominal pain.  Genitourinary:  Negative for dysuria and frequency.  Musculoskeletal:  Positive for gait problem. Negative for myalgias.  Skin:  Negative for wound.  Neurological:   Negative for dizziness and headaches.  Psychiatric/Behavioral:  Positive for confusion. Negative for dysphoric mood. The patient is not nervous/anxious.     Immunization History  Administered Date(s) Administered   INFLUENZA, HIGH DOSE SEASONAL PF 10/04/2019, 09/21/2020, 09/20/2022   Influenza Split 10/03/2014, 10/04/2015   Influenza-Unspecified 09/03/2013, 10/01/2016, 10/06/2017, 09/18/2018, 09/26/2023, 09/16/2024   Moderna Covid-19 Fall Seasonal Vaccine 95yrs & older 03/13/2023   Moderna Covid-19 Vaccine Bivalent Booster 36yrs & up 04/21/2021   Moderna Sars-Covid-2 Vaccination 12/22/2019, 01/19/2020, 10/19/2020, 08/08/2021, 10/13/2022   PNEUMOCOCCAL CONJUGATE-20 10/04/2022   Pneumococcal Conjugate-13 10/26/2014   Pneumococcal Polysaccharide-23 04/04/1993, 09/20/2008, 01/28/2016   Tdap 02/14/2013, 07/11/2023   Unspecified SARS-COV-2 Vaccination 08/31/2023, 03/14/2024   Zoster Recombinant(Shingrix) 02/07/2017, 06/18/2017   Zoster, Live 12/08/2009   Pertinent  Health Maintenance Due  Topic Date Due   Mammogram  06/08/2022   Influenza Vaccine  Completed   Bone Density Scan  Completed      09/25/2022    8:00 AM  09/27/2022    2:43 PM 03/09/2023    8:40 PM 07/10/2023    4:14 PM 07/22/2024   10:18 AM  Fall Risk  Falls in the past year?   1 1 0  Was there an injury with Fall?   1  1  0   Fall Risk Category Calculator   3 3 0  (RETIRED) Patient Fall Risk Level High fall risk  High fall risk      Patient at Risk for Falls Due to   History of fall(s);Impaired balance/gait;Impaired mobility;Mental status change;Impaired vision History of fall(s);Impaired balance/gait;Impaired mobility   Fall risk Follow up   Falls evaluation completed       Data saved with a previous flowsheet row definition   Functional Status Survey:    Vitals:   11/10/24 1224  BP: 129/72  Pulse: 91  Resp: 18  Temp: 97.9 F (36.6 C)  SpO2: 95%  Weight: 186 lb 6.4 oz (84.6 kg)  Height: 5' 5 (1.651 m)   Body  mass index is 31.02 kg/m. Physical Exam Vitals reviewed.  Constitutional:      General: She is not in acute distress. HENT:     Head: Normocephalic.     Right Ear: Tympanic membrane normal.     Left Ear: Tympanic membrane normal.     Nose: Rhinorrhea present.     Mouth/Throat:     Mouth: Mucous membranes are moist.     Pharynx: No posterior oropharyngeal erythema.  Eyes:     General:        Right eye: No discharge.        Left eye: No discharge.  Cardiovascular:     Rate and Rhythm: Normal rate and regular rhythm.     Pulses: Normal pulses.     Heart sounds: Normal heart sounds.  Pulmonary:     Effort: Pulmonary effort is normal. No respiratory distress.     Breath sounds: Examination of the right-upper field reveals wheezing. Examination of the left-upper field reveals wheezing. Wheezing present. No rhonchi or rales.     Comments: Expiratory  Abdominal:     General: Bowel sounds are normal. There is no distension.     Palpations: Abdomen is soft.     Tenderness: There is no abdominal tenderness.  Musculoskeletal:     Cervical back: Neck supple.     Right lower leg: No edema.     Left lower leg: No edema.  Lymphadenopathy:     Cervical: No cervical adenopathy.  Skin:    General: Skin is warm.     Capillary Refill: Capillary refill takes less than 2 seconds.  Neurological:     General: No focal deficit present.     Mental Status: She is alert. Mental status is at baseline.     Gait: Gait abnormal.  Psychiatric:        Mood and Affect: Mood normal.     Labs reviewed: Recent Labs    02/25/24 0000 04/07/24 0000 10/06/24 0000 11/09/24 1725  NA 135* 135* 137 132*  K 4.2 3.9 4.3 4.4  CL 98* 99 99 92*  CO2 30* 28* 10* 27  GLUCOSE  --   --   --  96  BUN 16 23* 21 16  CREATININE 1.3* 1.4* 1.6* 1.37*  CALCIUM 9.5 9.5  --  9.7   Recent Labs    04/07/24 0000 10/06/24 0000  AST 16 18  ALT 11 18  ALKPHOS 60 56  ALBUMIN 3.7 3.8  Recent Labs    04/07/24 0000  06/02/24 0000 10/06/24 0000 11/09/24 1725  WBC 8.4 7.4 5.5 6.4  NEUTROABS 5,620.00 4,507.00 2,926.00  --   HGB 11.9* 11.5* 12.3 13.1  HCT 35* 34* 37 38.5  MCV  --   --   --  97.5  PLT 277 248 297 294   Lab Results  Component Value Date   TSH 4.45 04/07/2024   Lab Results  Component Value Date   HGBA1C 5.6 10/06/2024   Lab Results  Component Value Date   CHOL 180 04/07/2024   HDL 48 04/07/2024   LDLCALC 102 04/07/2024   LDLDIRECT 132.0 07/12/2017   TRIG 180 (A) 04/07/2024   CHOLHDL 4.7 08/17/2021    Significant Diagnostic Results in last 30 days:  DG Chest 2 View Result Date: 11/09/2024 CLINICAL DATA:  Short of breath, cough and wheezing EXAM: DG CHEST 2V COMPARISON:  09/27/2022 FINDINGS: Frontal and lateral views of the chest demonstrate an unremarkable cardiac silhouette. No acute airspace disease, effusion, or pneumothorax. No acute bony abnormalities. IMPRESSION: 1. No acute intrathoracic process. Electronically Signed   By: Ozell Daring M.D.   On: 11/09/2024 18:34    Assessment/Plan 1. Influenza A (Primary) - 12/07 fever, poor appetite, sob - + influenza A in ED - start droplet precautions - cont Tamiflu  and  robitussin, tessalon  prn  2. Wheezing - expiratory wheezing to upper lobes - start duonebs BID x 3 days, then BID PRN x 14 days   3. Essential hypertension - controlled without medication  4. PAF (paroxysmal atrial fibrillation) (HCC) - HR< 100 with cardizem   - cont low dose Eliquis  for clot prevention   5. Moderate dementia without behavioral disturbance, psychotic disturbance, mood disturbance, or anxiety, unspecified dementia type (HCC) - no behaviors - dependent with ADLs except feeding - not on medication - cont skilled nursing     Family/ staff Communication: plan discussed with patient and nurse  Labs/tests ordered:  none

## 2024-11-14 ENCOUNTER — Encounter: Payer: Self-pay | Admitting: Orthopedic Surgery

## 2024-11-14 ENCOUNTER — Non-Acute Institutional Stay: Payer: Self-pay | Admitting: Orthopedic Surgery

## 2024-11-14 DIAGNOSIS — I10 Essential (primary) hypertension: Secondary | ICD-10-CM

## 2024-11-14 DIAGNOSIS — J101 Influenza due to other identified influenza virus with other respiratory manifestations: Secondary | ICD-10-CM

## 2024-11-14 DIAGNOSIS — I480 Paroxysmal atrial fibrillation: Secondary | ICD-10-CM

## 2024-11-14 DIAGNOSIS — F03B0 Unspecified dementia, moderate, without behavioral disturbance, psychotic disturbance, mood disturbance, and anxiety: Secondary | ICD-10-CM

## 2024-11-14 DIAGNOSIS — E039 Hypothyroidism, unspecified: Secondary | ICD-10-CM

## 2024-11-14 DIAGNOSIS — N1832 Chronic kidney disease, stage 3b: Secondary | ICD-10-CM

## 2024-11-14 NOTE — Progress Notes (Signed)
 Location:  Other [facility] Nursing Home Room Number: [facility] Place of Service:  SNF [phone]) Provider:  Gil Greig FORBES CARROLYN Laurence Camellia, DO  Patient Care Team: Laurence Camellia, DO as PCP - General (Internal Medicine) [name], MD as PCP - Cardiology (Cardiology) [name], RN (Inactive) as Oncology Nurse Navigator Caro Harlene POUR, NP as Nurse Practitioner (Geriatric Medicine)  Extended Emergency Contact Information Primary Emergency Contact: Seawood,david Address: [address]          [place], [place] [address] [place] Home Phone: [phone] Mobile Phone: [phone] Relation: Son  Code Status:  DNR Goals of care: Advanced Directive information    11/14/2024    9:27 AM  Advanced Directives  Does Patient Have a Medical Advance Directive? Yes  Type of Estate Agent of [place];Living will;Out of facility DNR (pink MOST or yellow form)  Does patient want to make changes to medical advance directive? No - Patient declined  Copy of Healthcare Power of Attorney in Chart? Yes - validated most recent copy scanned in chart (See row information)  Pre-existing out of facility DNR order (yellow form or pink MOST form) Yellow form placed in chart (order not valid for inpatient use)     Chief Complaint  Patient presents with   Medical Management of Chronic Issues    Routine Visit.    HPI:  Pt is a 89 y.o. female seen today for medical management of chronic diseases.    She currently resides on the skilled nursing unit at [facility]. PMH: HTN, PAF, hypothyroidism, secondary hyperparathyroidism, dementia, spondylolisthesis, CKD, thrombophilia, right breast cancer 2022, left renal mass and constipation.    Influenza- tested + 12/07, prescribed Tamiflu , increased wheezing, denies sob, sore throat or body aches PAF- TSH 4.45 005/04/2024, HR controlled with cardizem  and eliquis  for clot prevention HTN- BUN/creat 16/1.37 11/09/2024,  not on medication  CKD- GFR 35 (12/07)> was 30 (11/03) Hypothyroidism- TSH 4.45 04/07/2024, remains on levothyroxine  Dementia- BIMs score 15/15 (09/19), no behaviors, dependent with ADLs except feeding, not on medication   No recent falls or injuries  Recent blood pressures:  12/12- 118/68, 112/72, 137/87  12/11- 132/72, 108/64, 109/71   Past Medical History:  Diagnosis Date   Breast cancer (HCC)    CKD (chronic kidney disease) stage 3, GFR 30-59 ml/min (HCC)    Displaced intertrochanteric fracture of right femur (HCC)    Per [facility], [employer] [facility]   Former smoker    Heart disease    mitral valvular   Hyperlipidemia    Hyperparathyroidism, secondary    Hypertension    Hypo-osmolality and hyponatremia    Per [employer] EMR, [employer] Care   Hypothyroidism    Macular degeneration    Per [employer] EMR, [employer] [facility]   Sensorineural hearing loss    Spinal stenosis of lumbar region 2006   s/p surgery   UTI (urinary tract infection)    Per [facility]MR, [employer] [facility]   Vitamin D  deficiency    Per [facility] EMR, [employer] Care   Past Surgical History:  Procedure Laterality Date   ABDOMINAL HYSTERECTOMY     bilateral cataract surgery     BREAST LUMPECTOMY Right 07/22/2021   CHOLECYSTECTOMY  12/04/1978   FRACTURE SURGERY Left 12/29/2018   FEMUR   INTRAMEDULLARY (IM) NAIL INTERTROCHANTERIC Right 09/21/2022   Procedure: INTRAMEDULLARY (IM) NAIL INTERTROCHANTERIC;  Surgeon: Marchia Drivers, MD;  Location: [facility] ORS;  Service: Orthopedics;  Laterality: Right;   JOINT REPLACEMENT     Bilateral knee   LAPAROSCOPIC SIGMOID COLECTOMY  12/04/2009   secondary to benign mass   MELANOMA EXCISION WITH SENTINEL LYMPH NODE BIOPSY Right 07/22/2021   Procedure: MELANOMA EXCISION WITH SENTINEL LYMPH NODE BIOPSY;  Surgeon: Rodolph Romano, MD;  Location: [facility] ORS;  Service: General;  Laterality: Right;   PART MASTECTOMY,RADIO FREQUENCY LOCALIZER,AXILLARY  SENTINEL NODE BIOPSY Right 07/22/2021   Procedure: PART MASTECTOMY,RADIO FREQUENCY LOCALIZER,AXILLARY SENTINEL NODE BIOPSY;  Surgeon: Rodolph Romano, MD;  Location: [facility] ORS;  Service: General;  Laterality: Right;   REPLACEMENT TOTAL KNEE BILATERAL     SMALL INTESTINE SURGERY  12/04/2008   for SBO   SPINE SURGERY  12/04/2004   rods, [facility]   tendon repair     achille heall, left     Allergies[1]  Outpatient Encounter Medications as of 11/14/2024  Medication Sig   acetaminophen  (TYLENOL ) 500 MG tablet Take 1,000 mg by mouth at bedtime.   apixaban  (ELIQUIS ) 2.5 MG TABS tablet Take 2.5 mg by mouth 2 (two) times daily.   Calcium Carb-Cholecalciferol (RA CALCIUM PLUS VITAMIN D3) 600-10 MG-MCG TABS Take 1 tablet by mouth 2 (two) times daily.   Cholecalciferol (VITAMIN D3) 50 MCG (2000 UT) TABS Take 1 tablet by mouth daily.   cyanocobalamin (VITAMIN B12) 1000 MCG tablet Take 1,000 mcg by mouth daily.   dextromethorphan-guaiFENesin  (MUCINEX  DM) 30-600 MG 12hr tablet Take 1 tablet by mouth 2 (two) times daily.   diltiazem  (CARDIZEM  CD) 120 MG 24 hr capsule Take 120 mg by mouth daily.   guaiFENesin  (ROBITUSSIN) 100 MG/5ML liquid Take 5 mLs by mouth every 4 (four) hours as needed for up to 5 days for cough or to loosen phlegm.   ipratropium-albuterol  (DUONEB) 0.5-2.5 (3) MG/3ML SOLN Take 3 mLs by nebulization every 12 (twelve) hours as needed (For wheezing).   levothyroxine  (SYNTHROID ) 50 MCG tablet TAKE 1 TABLET EVERY DAY ON EMPTY STOMACHWITH A GLASS OF WATER  AT LEAST 30-60 MINBEFORE BREAKFAST   Magnesium  Oxide 250 MG TABS Take 1 tablet by mouth every other day. At bedtime   Multiple Vitamins-Minerals (PRESERVISION AREDS PO) Take 1 capsule by mouth 2 (two) times daily.   nystatin powder Apply 1 Application topically 2 (two) times daily. As needed   oseltamivir  (TAMIFLU ) 30 MG capsule Take 1 capsule (30 mg total) by mouth 2 (two) times daily for 5 days.   polyethylene glycol (MIRALAX  /  GLYCOLAX ) 17 g packet Take 17 g by mouth every Monday, Wednesday, and Friday.   benzonatate  (TESSALON ) 100 MG capsule Take 1 capsule (100 mg total) by mouth 3 (three) times daily as needed for cough. (Patient not taking: Reported on 11/14/2024)   No facility-administered encounter medications on file as of 11/14/2024.    Review of Systems  Constitutional:  Positive for fatigue. Negative for fever.  HENT:  Positive for rhinorrhea. Negative for sore throat and trouble swallowing.   Eyes:  Negative for visual disturbance.  Respiratory:  Positive for cough and wheezing. Negative for shortness of breath.   Cardiovascular:  Negative for chest pain and leg swelling.  Gastrointestinal:  Positive for constipation. Negative for abdominal distention and abdominal pain.  Genitourinary:  Negative for dysuria and frequency.  Musculoskeletal:  Positive for gait problem.  Skin:  Negative for wound.  Neurological:  Negative for dizziness and headaches.  Psychiatric/Behavioral:  Positive for confusion. Negative for dysphoric mood and sleep disturbance. The patient is not nervous/anxious.  Immunization History  Administered Date(s) Administered   INFLUENZA, HIGH DOSE SEASONAL PF 10/04/2019, 09/21/2020, 09/20/2022   Influenza Split 10/03/2014, 10/04/2015   Influenza-Unspecified 09/03/2013, 10/01/2016, 10/06/2017, 09/18/2018, 09/26/2023, 09/16/2024   Moderna Covid-19 Fall Seasonal Vaccine 15yrs & older 03/13/2023   Moderna Covid-19 Vaccine Bivalent Booster 47yrs & up 04/21/2021   Moderna Sars-Covid-2 Vaccination 12/22/2019, 01/19/2020, 10/19/2020, 08/08/2021, 10/13/2022   PNEUMOCOCCAL CONJUGATE-20 10/04/2022   Pneumococcal Conjugate-13 10/26/2014   Pneumococcal Polysaccharide-23 04/04/1993, 09/20/2008, 01/28/2016   Tdap 02/14/2013, 07/11/2023   Unspecified SARS-COV-2 Vaccination 08/31/2023, 03/14/2024   Zoster Recombinant(Shingrix) 02/07/2017, 06/18/2017   Zoster, Live 12/08/2009   Pertinent   Health Maintenance Due  Topic Date Due   Mammogram  06/08/2022   Influenza Vaccine  Completed   Bone Density Scan  Completed      09/27/2022    2:43 PM 03/09/2023    8:40 PM 07/10/2023    4:14 PM 07/22/2024   10:18 AM 11/10/2024   12:46 PM  Fall Risk  Falls in the past year?  1 1 0 1  Was there an injury with Fall?  1  1  0  0  Fall Risk Category Calculator  3 3 0 1  (RETIRED) Patient Fall Risk Level High fall risk       Patient at Risk for Falls Due to  History of fall(s);Impaired balance/gait;Impaired mobility;Mental status change;Impaired vision History of fall(s);Impaired balance/gait;Impaired mobility  History of fall(s);Impaired balance/gait  Fall risk Follow up  Falls evaluation completed   Falls evaluation completed     Data saved with a previous flowsheet row definition   Functional Status Survey:    Vitals:   11/14/24 0925  BP: 137/87  Resp: 15  Temp: (!) 97 F (36.1 C)  SpO2: 94%  Weight: 186 lb 6.4 oz (84.6 kg)  Height: 5' 5 (1.651 m)   Body mass index is 31.02 kg/m. Physical Exam Vitals reviewed.  Constitutional:      General: She is not in acute distress. HENT:     Head: Normocephalic.  Eyes:     General:        Right eye: No discharge.        Left eye: No discharge.  Cardiovascular:     Rate and Rhythm: Normal rate. Rhythm irregular.     Pulses: Normal pulses.     Heart sounds: Normal heart sounds.  Pulmonary:     Effort: Pulmonary effort is normal. No respiratory distress.     Breath sounds: Wheezing present. No rhonchi or rales.     Comments: expiratory Abdominal:     General: Bowel sounds are normal. There is no distension.     Palpations: Abdomen is soft.     Tenderness: There is no abdominal tenderness.  Musculoskeletal:     Cervical back: Neck supple.     Right lower leg: No edema.     Left lower leg: No edema.  Skin:    General: Skin is warm.     Capillary Refill: Capillary refill takes less than 2 seconds.  Neurological:      General: No focal deficit present.     Mental Status: She is alert. Mental status is at baseline.     Gait: Gait abnormal.  Psychiatric:        Mood and Affect: Mood normal.     Comments: Very pleasant, alert to self/familiar face      Labs reviewed: Recent Labs    02/25/24 0000 04/07/24 0000 10/06/24 0000 11/09/24 1725  NA  135* 135* 137 132*  K 4.2 3.9 4.3 4.4  CL 98* 99 99 92*  CO2 30* 28* 10* 27  GLUCOSE  --   --   --  96  BUN 16 23* 21 16  CREATININE 1.3* 1.4* 1.6* 1.37*  CALCIUM 9.5 9.5  --  9.7   Recent Labs    04/07/24 0000 10/06/24 0000  AST 16 18  ALT 11 18  ALKPHOS 60 56  ALBUMIN 3.7 3.8   Recent Labs    04/07/24 0000 06/02/24 0000 10/06/24 0000 11/09/24 1725  WBC 8.4 7.4 5.5 6.4  NEUTROABS 5,620.00 4,507.00 2,926.00  --   HGB 11.9* 11.5* 12.3 13.1  HCT 35* 34* 37 38.5  MCV  --   --   --  97.5  PLT 277 248 297 294   Lab Results  Component Value Date   TSH 4.45 04/07/2024   Lab Results  Component Value Date   HGBA1C 5.6 10/06/2024   Lab Results  Component Value Date   CHOL 180 04/07/2024   HDL 48 04/07/2024   LDLCALC 102 04/07/2024   LDLDIRECT 132.0 07/12/2017   TRIG 180 (A) 04/07/2024   CHOLHDL 4.7 08/17/2021    Significant Diagnostic Results in last 30 days:  DG Chest 2 View Result Date: 11/09/2024 CLINICAL DATA:  Short of breath, cough and wheezing EXAM: DG CHEST 2V COMPARISON:  09/27/2022 FINDINGS: Frontal and lateral views of the chest demonstrate an unremarkable cardiac silhouette. No acute airspace disease, effusion, or pneumothorax. No acute bony abnormalities. IMPRESSION: 1. No acute intrathoracic process. Electronically Signed   By: Ozell Daring M.D.   On: 11/09/2024 18:34    Assessment/Plan 1. Influenza A (Primary) - 12/07 + test - completed Tamiflu  - exp wheezing on exam - start Duonebs x 3 days   2. Paroxysmal atrial fibrillation (HCC) - HR< 100 with cardizem  - cont Eliquis  for clot prevention  3. Essential  hypertension - controlled without medication  4. Chronic kidney disease, stage 3b (HCC) - associated with chronic age - encourage hydration with water  - avoid NSAIDS  5. Acquired hypothyroidism - TSH stable - cont levothyroxine   6. Moderate dementia without behavioral disturbance, psychotic disturbance, mood disturbance, or anxiety, unspecified dementia type (HCC) - no behaviors - dependent with ADLs except feeding - ambulates with wheelchair - weight stable - not on medication - cont skilled nursing     Family/ staff Communication: plan discussed patient and nurse  Labs/tests ordered:  none         [1]  Allergies Allergen Reactions   Bactrim  [Sulfamethoxazole -Trimethoprim ] Nausea And Vomiting    Lethargic and weakness

## 2024-12-01 ENCOUNTER — Encounter: Payer: Self-pay | Admitting: Orthopedic Surgery

## 2024-12-01 ENCOUNTER — Non-Acute Institutional Stay (SKILLED_NURSING_FACILITY): Payer: Self-pay | Admitting: Orthopedic Surgery

## 2024-12-01 DIAGNOSIS — J40 Bronchitis, not specified as acute or chronic: Secondary | ICD-10-CM | POA: Diagnosis not present

## 2024-12-01 DIAGNOSIS — I10 Essential (primary) hypertension: Secondary | ICD-10-CM

## 2024-12-01 DIAGNOSIS — F03B0 Unspecified dementia, moderate, without behavioral disturbance, psychotic disturbance, mood disturbance, and anxiety: Secondary | ICD-10-CM

## 2024-12-01 DIAGNOSIS — I480 Paroxysmal atrial fibrillation: Secondary | ICD-10-CM | POA: Diagnosis not present

## 2024-12-01 MED ORDER — PREDNISONE 20 MG PO TABS: 20.0000 mg | ORAL_TABLET | Freq: Every day | ORAL | Status: AC

## 2024-12-01 NOTE — Progress Notes (Signed)
 " Location:  Other [facility].  Nursing Home Room Number: [facility] [id number] Place of Service:  SNF [phone]) Provider:  Greig Cluster, NP  PCP: Laurence Locus, DO  Patient Care Team: Laurence Locus, DO as PCP - General (Internal Medicine) [name], MD as PCP - Cardiology (Cardiology) [name], RN (Inactive) as Oncology Nurse Navigator Caro Harlene POUR, NP as Nurse Practitioner (Geriatric Medicine)  Extended Emergency Contact Information Primary Emergency Contact: Lezama,david Address: [address]          [place], [place] [address] [place] Home Phone: [phone] Mobile Phone: [phone] Relation: Son  Code Status:  DNR Goals of care: Advanced Directive information    11/14/2024    9:27 AM  Advanced Directives  Does Patient Have a Medical Advance Directive? Yes  Type of Estate Agent of [place];Living will;Out of facility DNR (pink MOST or yellow form)  Does patient want to make changes to medical advance directive? No - Patient declined  Copy of Healthcare Power of Attorney in Chart? Yes - validated most recent copy scanned in chart (See row information)  Pre-existing out of facility DNR order (yellow form or pink MOST form) Yellow form placed in chart (order not valid for inpatient use)     Chief Complaint  Patient presents with   Cough    Persistent Cough.     HPI:  Pt is a 89 y.o. female seen today for acute cough due to persistent cough.   She currently resides on the skilled nursing unit at [facility]. PMH: HTN, PAF, hypothyroidism, secondary hyperparathyroidism, dementia, spondylolisthesis, CKD, thrombophilia, right breast cancer 2022, left renal mass and constipation.   12/07 she tested + flu in the ED. She was discharged with tamiflu . She improved with rest and fluids. Duonebs started for a brief period due to wheezing. Today, she reports dry cough that is keeping her up at night. Talking also stimulates cough. Afebrile.  Vitals stable.   Past Surgical History:  Procedure Laterality Date   ABDOMINAL HYSTERECTOMY     bilateral cataract surgery     BREAST LUMPECTOMY Right 07/22/2021   CHOLECYSTECTOMY  12/04/1978   FRACTURE SURGERY Left 12/29/2018   FEMUR   INTRAMEDULLARY (IM) NAIL INTERTROCHANTERIC Right 09/21/2022   Procedure: INTRAMEDULLARY (IM) NAIL INTERTROCHANTERIC;  Surgeon: Marchia Drivers, MD;  Location: [facility] ORS;  Service: Orthopedics;  Laterality: Right;   JOINT REPLACEMENT     Bilateral knee   LAPAROSCOPIC SIGMOID COLECTOMY  12/04/2009   secondary to benign mass   MELANOMA EXCISION WITH SENTINEL LYMPH NODE BIOPSY Right 07/22/2021   Procedure: MELANOMA EXCISION WITH SENTINEL LYMPH NODE BIOPSY;  Surgeon: Rodolph Romano, MD;  Location: [facility] ORS;  Service: General;  Laterality: Right;   PART MASTECTOMY,RADIO FREQUENCY LOCALIZER,AXILLARY SENTINEL NODE BIOPSY Right 07/22/2021   Procedure: PART MASTECTOMY,RADIO FREQUENCY LOCALIZER,AXILLARY SENTINEL NODE BIOPSY;  Surgeon: Rodolph Romano, MD;  Location: [facility] ORS;  Service: General;  Laterality: Right;   REPLACEMENT TOTAL KNEE BILATERAL     SMALL INTESTINE SURGERY  12/04/2008   for SBO   SPINE SURGERY  12/04/2004   rods, [facility]   tendon repair     achille heall, left     Allergies[1]  Outpatient Encounter Medications as of 12/01/2024  Medication Sig   acetaminophen  (TYLENOL ) 325 MG tablet Take 650 mg by mouth every 4 (four) hours as needed.   acetaminophen  (TYLENOL ) 500 MG tablet Take 1,000 mg by mouth at bedtime.   apixaban  (ELIQUIS ) 2.5 MG TABS  tablet Take 2.5 mg by mouth 2 (two) times daily.   Calcium Carb-Cholecalciferol (RA CALCIUM PLUS VITAMIN D3) 600-10 MG-MCG TABS Take 1 tablet by mouth 2 (two) times daily.   Cholecalciferol (VITAMIN D3) 50 MCG (2000 UT) TABS Take 1 tablet by mouth daily.   cyanocobalamin (VITAMIN B12) 1000 MCG tablet Take 1,000 mcg by mouth daily.   dextromethorphan-guaiFENesin  (MUCINEX  DM) 30-600 MG 12hr  tablet Take 1 tablet by mouth 2 (two) times daily.   diltiazem  (CARDIZEM  CD) 120 MG 24 hr capsule Take 120 mg by mouth daily.   levothyroxine  (SYNTHROID ) 50 MCG tablet TAKE 1 TABLET EVERY DAY ON EMPTY STOMACHWITH A GLASS OF WATER  AT LEAST 30-60 MINBEFORE BREAKFAST   Magnesium  Oxide 250 MG TABS Take 1 tablet by mouth every other day. At bedtime   Multiple Vitamins-Minerals (PRESERVISION AREDS PO) Take 1 capsule by mouth 2 (two) times daily.   nystatin powder Apply 1 Application topically 2 (two) times daily. As needed   polyethylene glycol (MIRALAX  / GLYCOLAX ) 17 g packet Take 17 g by mouth every Monday, Wednesday, and Friday.   ipratropium-albuterol  (DUONEB) 0.5-2.5 (3) MG/3ML SOLN Take 3 mLs by nebulization every 12 (twelve) hours as needed (For wheezing). (Patient not taking: Reported on 12/01/2024)   No facility-administered encounter medications on file as of 12/01/2024.    Review of Systems  Unable to perform ROS: Dementia    Immunization History  Administered Date(s) Administered   INFLUENZA, HIGH DOSE SEASONAL PF 10/04/2019, 09/21/2020, 09/20/2022   Influenza Split 10/03/2014, 10/04/2015   Influenza-Unspecified 09/03/2013, 10/01/2016, 10/06/2017, 09/18/2018, 09/26/2023, 09/16/2024   Moderna Covid-19 Fall Seasonal Vaccine 40yrs & older 03/13/2023   Moderna Covid-19 Vaccine Bivalent Booster 32yrs & up 04/21/2021   Moderna Sars-Covid-2 Vaccination 12/22/2019, 01/19/2020, 10/19/2020, 08/08/2021, 10/13/2022   PNEUMOCOCCAL CONJUGATE-20 10/04/2022   Pneumococcal Conjugate-13 10/26/2014   Pneumococcal Polysaccharide-23 04/04/1993, 09/20/2008, 01/28/2016   Tdap 02/14/2013, 07/11/2023   Unspecified SARS-COV-2 Vaccination 08/31/2023, 03/14/2024   Zoster Recombinant(Shingrix) 02/07/2017, 06/18/2017   Zoster, Live 12/08/2009   Pertinent  Health Maintenance Due  Topic Date Due   Mammogram  06/08/2022   Influenza Vaccine  Completed   Bone Density Scan  Completed      09/27/2022     2:43 PM 03/09/2023    8:40 PM 07/10/2023    4:14 PM 07/22/2024   10:18 AM 11/10/2024   12:46 PM  Fall Risk  Falls in the past year?  1 1 0 1  Was there an injury with Fall?  1  1  0  0  Fall Risk Category Calculator  3 3 0 1  (RETIRED) Patient Fall Risk Level High fall risk       Patient at Risk for Falls Due to  History of fall(s);Impaired balance/gait;Impaired mobility;Mental status change;Impaired vision History of fall(s);Impaired balance/gait;Impaired mobility  History of fall(s);Impaired balance/gait  Fall risk Follow up  Falls evaluation completed   Falls evaluation completed     Data saved with a previous flowsheet row definition   Functional Status Survey:    Vitals:   12/01/24 1002  BP: 136/84  Pulse: 77  Resp: 18  Temp: 97.6 F (36.4 C)  SpO2: 96%  Weight: 186 lb 6.4 oz (84.6 kg)  Height: 5' 5 (1.651 m)   Body mass index is 31.02 kg/m. Physical Exam Vitals reviewed.  Constitutional:      General: She is not in acute distress.    Appearance: She is not ill-appearing.  HENT:     Head: Normocephalic.  Right Ear: Tympanic membrane normal.     Left Ear: Tympanic membrane normal.     Nose: Nose normal. No rhinorrhea.     Mouth/Throat:     Mouth: Mucous membranes are moist.  Eyes:     General:        Right eye: No discharge.        Left eye: No discharge.  Cardiovascular:     Rate and Rhythm: Normal rate. Rhythm irregular.     Pulses: Normal pulses.     Heart sounds: Normal heart sounds.  Pulmonary:     Effort: Pulmonary effort is normal. No respiratory distress.     Breath sounds: Normal breath sounds. No wheezing, rhonchi or rales.  Abdominal:     General: Bowel sounds are normal. There is no distension.     Palpations: Abdomen is soft.     Tenderness: There is no abdominal tenderness.  Musculoskeletal:     Cervical back: Neck supple.     Right lower leg: No edema.     Left lower leg: No edema.  Skin:    General: Skin is warm.     Capillary Refill:  Capillary refill takes less than 2 seconds.  Neurological:     General: No focal deficit present.     Mental Status: She is alert. Mental status is at baseline.     Gait: Gait abnormal.  Psychiatric:        Mood and Affect: Mood normal.     Labs reviewed: Recent Labs    02/25/24 0000 04/07/24 0000 10/06/24 0000 11/09/24 1725  NA 135* 135* 137 132*  K 4.2 3.9 4.3 4.4  CL 98* 99 99 92*  CO2 30* 28* 10* 27  GLUCOSE  --   --   --  96  BUN 16 23* 21 16  CREATININE 1.3* 1.4* 1.6* 1.37*  CALCIUM 9.5 9.5  --  9.7   Recent Labs    04/07/24 0000 10/06/24 0000  AST 16 18  ALT 11 18  ALKPHOS 60 56  ALBUMIN 3.7 3.8   Recent Labs    04/07/24 0000 06/02/24 0000 10/06/24 0000 11/09/24 1725  WBC 8.4 7.4 5.5 6.4  NEUTROABS 5,620.00 4,507.00 2,926.00  --   HGB 11.9* 11.5* 12.3 13.1  HCT 35* 34* 37 38.5  MCV  --   --   --  97.5  PLT 277 248 297 294   Lab Results  Component Value Date   TSH 4.45 04/07/2024   Lab Results  Component Value Date   HGBA1C 5.6 10/06/2024   Lab Results  Component Value Date   CHOL 180 04/07/2024   HDL 48 04/07/2024   LDLCALC 102 04/07/2024   LDLDIRECT 132.0 07/12/2017   TRIG 180 (A) 04/07/2024   CHOLHDL 4.7 08/17/2021    Significant Diagnostic Results in last 30 days:  DG Chest 2 View Result Date: 11/09/2024 CLINICAL DATA:  Short of breath, cough and wheezing EXAM: DG CHEST 2V COMPARISON:  09/27/2022 FINDINGS: Frontal and lateral views of the chest demonstrate an unremarkable cardiac silhouette. No acute airspace disease, effusion, or pneumothorax. No acute bony abnormalities. IMPRESSION: 1. No acute intrathoracic process. Electronically Signed   By: Ozell Daring M.D.   On: 11/09/2024 18:34    Assessment/Plan 1. Bronchitis (Primary) - 12/07 + flu - completed tamiflu  and duonebs - lung sounds clear - suspect rebound bronchitis - start prednisone x 7 days   2. Essential hypertension - controlled without medication  3. Paroxysmal  atrial fibrillation (HCC) - HR< 100 with diltiazem  - cont Eliquis  for clot prevention   4. Moderate dementia without behavioral disturbance, psychotic disturbance, mood disturbance, or anxiety, unspecified dementia type (HCC) - no behaviors - dependent with ADLs except feeding - weight stable - not on medication - cont skilled nursing     Family/ staff Communication: plan discussed with nurse  Labs/tests ordered:  none        [1]  Allergies Allergen Reactions   Bactrim  [Sulfamethoxazole -Trimethoprim ] Nausea And Vomiting    Lethargic and weakness   "

## 2024-12-16 ENCOUNTER — Non-Acute Institutional Stay (SKILLED_NURSING_FACILITY): Payer: Self-pay | Admitting: Nurse Practitioner

## 2024-12-16 ENCOUNTER — Encounter: Payer: Self-pay | Admitting: Nurse Practitioner

## 2024-12-16 DIAGNOSIS — E559 Vitamin D deficiency, unspecified: Secondary | ICD-10-CM

## 2024-12-16 DIAGNOSIS — N184 Chronic kidney disease, stage 4 (severe): Secondary | ICD-10-CM

## 2024-12-16 DIAGNOSIS — F03B0 Unspecified dementia, moderate, without behavioral disturbance, psychotic disturbance, mood disturbance, and anxiety: Secondary | ICD-10-CM

## 2024-12-16 DIAGNOSIS — K5901 Slow transit constipation: Secondary | ICD-10-CM | POA: Diagnosis not present

## 2024-12-16 DIAGNOSIS — E039 Hypothyroidism, unspecified: Secondary | ICD-10-CM

## 2024-12-16 DIAGNOSIS — I480 Paroxysmal atrial fibrillation: Secondary | ICD-10-CM | POA: Diagnosis not present

## 2024-12-16 DIAGNOSIS — N1832 Chronic kidney disease, stage 3b: Secondary | ICD-10-CM

## 2024-12-16 DIAGNOSIS — R300 Dysuria: Secondary | ICD-10-CM | POA: Diagnosis not present

## 2024-12-16 NOTE — Progress Notes (Signed)
 " Location:  Other [facility].  Nursing Home Room Number: [facility] [id number] Place of Service:  SNF [phone]) Pamela An, NP  PCP: Laurence Locus, DO  Patient Care Team: Laurence Locus, DO as PCP - General (Internal Medicine) Pamela Rogue, MD as PCP - Cardiology (Cardiology) [name], RN (Inactive) as Oncology Nurse Navigator [name], NP as Nurse Practitioner (Geriatric Medicine)  Extended Emergency Contact Information Primary Emergency Contact: Pamela Paul Address: [address]          [place], [place] [address] [place] Home Phone: [phone] Mobile Phone: [phone] Relation: Son  Goals of care: Advanced Directive information    11/14/2024    9:27 AM  Advanced Directives  Does Patient Have a Medical Advance Directive? Yes  Type of Estate Agent of [place];Living will;Out of facility DNR (pink MOST or yellow form)  Does patient want to make changes to medical advance directive? No - Patient declined  Copy of Healthcare Power of Attorney in Chart? Yes - validated most recent copy scanned in chart (See row information)  Pre-existing out of facility DNR order (yellow form or pink MOST form) Yellow form placed in chart (order not valid for inpatient use)     Chief Complaint  Patient presents with   Medical Management of Chronic Issues    Medical Management of Chronic Issues.     HPI:  Pt is a 89 y.o. female seen today for medical management of chronic disease. Pt with hx of htn, a fib, dementia, hypothyroid, constipation.  She had the flu in December but is doing much better since this.  However it has been noted that she has has some weight loss over the last 2 months. Staff reports she eats well occasionally.  She reports decrease appetite.  No pain with chewing or trouble swallowing.  Denies issues with shortness of breath, chest pains or LE edema Reports she has having some pain/warmth with urination.  Denies  frequency or urgency.  No abdominal pain/discomfort note.d     Past Medical History:  Diagnosis Date   Breast cancer (HCC)    CKD (chronic kidney disease) stage 3, GFR 30-59 ml/min (HCC)    Displaced intertrochanteric fracture of right femur (HCC)    Per [facility] EMR, [employer] Care   Former smoker    Heart disease    mitral valvular   Hyperlipidemia    Hyperparathyroidism, secondary    Hypertension    Hypo-osmolality and hyponatremia    Per [employer] EMR, [employer] Care   Hypothyroidism    Macular degeneration    Per [employer] EMR, [employer] Care   Sensorineural hearing loss    Spinal stenosis of lumbar region 2006   s/p surgery   UTI (urinary tract infection)    Per [facility] EMR, [employer] Care   Vitamin D  deficiency    Per [facility] EMR, [employer] Care   Past Surgical History:  Procedure Laterality Date   ABDOMINAL HYSTERECTOMY     bilateral cataract surgery     BREAST LUMPECTOMY Right 07/22/2021   CHOLECYSTECTOMY  12/04/1978   FRACTURE SURGERY Left 12/29/2018   FEMUR   INTRAMEDULLARY (IM) NAIL INTERTROCHANTERIC Right 09/21/2022   Procedure: INTRAMEDULLARY (IM) NAIL INTERTROCHANTERIC;  Surgeon: Marchia Drivers, MD;  Location: [facility] ORS;  Service: Orthopedics;  Laterality: Right;   JOINT REPLACEMENT     Bilateral knee   LAPAROSCOPIC SIGMOID COLECTOMY  12/04/2009   secondary to benign mass  MELANOMA EXCISION WITH SENTINEL LYMPH NODE BIOPSY Right 07/22/2021   Procedure: MELANOMA EXCISION WITH SENTINEL LYMPH NODE BIOPSY;  Surgeon: Rodolph Romano, MD;  Location: [facility] ORS;  Service: General;  Laterality: Right;   PART MASTECTOMY,RADIO FREQUENCY LOCALIZER,AXILLARY SENTINEL NODE BIOPSY Right 07/22/2021   Procedure: PART MASTECTOMY,RADIO FREQUENCY LOCALIZER,AXILLARY SENTINEL NODE BIOPSY;  Surgeon: Rodolph Romano, MD;  Location: [facility] ORS;  Service: General;  Laterality: Right;   REPLACEMENT TOTAL KNEE BILATERAL     SMALL INTESTINE SURGERY   12/04/2008   for SBO   SPINE SURGERY  12/04/2004   rods, [facility]   tendon repair     achille heall, left     Allergies[1]  Outpatient Encounter Medications as of 12/16/2024  Medication Sig   acetaminophen  (TYLENOL ) 500 MG tablet Take 1,000 mg by mouth at bedtime.   apixaban  (ELIQUIS ) 2.5 MG TABS tablet Take 2.5 mg by mouth 2 (two) times daily.   Calcium Carb-Cholecalciferol (RA CALCIUM PLUS VITAMIN D3) 600-10 MG-MCG TABS Take 1 tablet by mouth 2 (two) times daily.   Cholecalciferol (VITAMIN D3) 50 MCG (2000 UT) TABS Take 1 tablet by mouth daily.   cyanocobalamin (VITAMIN B12) 1000 MCG tablet Take 1,000 mcg by mouth daily.   diltiazem  (CARDIZEM  CD) 120 MG 24 hr capsule Take 120 mg by mouth daily.   levothyroxine  (SYNTHROID ) 50 MCG tablet TAKE 1 TABLET EVERY DAY ON EMPTY STOMACHWITH A GLASS OF WATER  AT LEAST 30-60 MINBEFORE BREAKFAST   Magnesium  Oxide 250 MG TABS Take 1 tablet by mouth every other day. At bedtime   Multiple Vitamins-Minerals (PRESERVISION AREDS PO) Take 1 capsule by mouth 2 (two) times daily.   nystatin powder Apply 1 Application topically 2 (two) times daily. As needed   polyethylene glycol (MIRALAX  / GLYCOLAX ) 17 g packet Take 17 g by mouth every Monday, Wednesday, and Friday.   acetaminophen  (TYLENOL ) 325 MG tablet Take 650 mg by mouth every 4 (four) hours as needed. (Patient not taking: Reported on 12/16/2024)   dextromethorphan-guaiFENesin  (MUCINEX  DM) 30-600 MG 12hr tablet Take 1 tablet by mouth 2 (two) times daily. (Patient not taking: Reported on 12/16/2024)   No facility-administered encounter medications on file as of 12/16/2024.    Review of Systems  Constitutional:  Negative for activity change, appetite change, fatigue and unexpected weight change.  HENT:  Negative for congestion and hearing loss.   Eyes: Negative.   Respiratory:  Negative for cough and shortness of breath.   Cardiovascular:  Negative for chest pain, palpitations and leg swelling.   Gastrointestinal:  Negative for abdominal pain, constipation and diarrhea.  Genitourinary:  Positive for dysuria. Negative for difficulty urinating and flank pain.  Musculoskeletal:  Negative for arthralgias and myalgias.  Skin:  Negative for color change and wound.  Neurological:  Negative for dizziness and weakness.  Psychiatric/Behavioral:  Negative for agitation, behavioral problems and confusion.      Immunization History  Administered Date(s) Administered   INFLUENZA, HIGH DOSE SEASONAL PF 10/04/2019, 09/21/2020, 09/20/2022   Influenza Split 10/03/2014, 10/04/2015   Influenza-Unspecified 09/03/2013, 10/01/2016, 10/06/2017, 09/18/2018, 09/26/2023, 09/16/2024   Moderna Covid-19 Fall Seasonal Vaccine 31yrs & older 03/13/2023   Moderna Covid-19 Vaccine Bivalent Booster 73yrs & up 04/21/2021   Moderna Sars-Covid-2 Vaccination 12/22/2019, 01/19/2020, 10/19/2020, 08/08/2021, 10/13/2022   PNEUMOCOCCAL CONJUGATE-20 10/04/2022   Pneumococcal Conjugate-13 10/26/2014   Pneumococcal Polysaccharide-23 04/04/1993, 09/20/2008, 01/28/2016   Tdap 02/14/2013, 07/11/2023   Unspecified SARS-COV-2 Vaccination 08/31/2023, 03/14/2024   Zoster Recombinant(Shingrix) 02/07/2017, 06/18/2017   Zoster, Live 12/08/2009  Pertinent  Health Maintenance Due  Topic Date Due   Mammogram  06/08/2022   Influenza Vaccine  Completed   Bone Density Scan  Completed      03/09/2023    8:40 PM 07/10/2023    4:14 PM 07/22/2024   10:18 AM 11/10/2024   12:46 PM 12/01/2024   12:39 PM  Fall Risk  Falls in the past year? 1 1 0 1 1  Was there Paul injury with Fall? 1  1  0  0 0  Fall Risk Category Calculator 3 3 0 1 1  Patient at Risk for Falls Due to History of fall(s);Impaired balance/gait;Impaired mobility;Mental status change;Impaired vision History of fall(s);Impaired balance/gait;Impaired mobility  History of fall(s);Impaired balance/gait History of fall(s);Impaired balance/gait  Fall risk Follow up Falls evaluation  completed   Falls evaluation completed Falls evaluation completed     Data saved with a previous flowsheet row definition   Functional Status Survey:    Vitals:   12/16/24 0914  BP: 130/81  Pulse: 76  Resp: 18  Temp: (!) 97.5 F (36.4 C)  SpO2: 97%  Weight: 179 lb (81.2 kg)  Height: 5' 5 (1.651 m)   Body mass index is 29.79 kg/m. Physical Exam Constitutional:      General: She is not in acute distress.    Appearance: She is well-developed. She is not diaphoretic.  HENT:     Head: Normocephalic and atraumatic.     Mouth/Throat:     Pharynx: No oropharyngeal exudate.  Eyes:     Conjunctiva/sclera: Conjunctivae normal.     Pupils: Pupils are equal, round, and reactive to light.  Cardiovascular:     Rate and Rhythm: Normal rate and regular rhythm.     Heart sounds: Normal heart sounds.  Pulmonary:     Effort: Pulmonary effort is normal.     Breath sounds: Normal breath sounds.  Abdominal:     General: Bowel sounds are normal.     Palpations: Abdomen is soft.  Musculoskeletal:        General: No tenderness.     Cervical back: Normal range of motion and neck supple.  Skin:    General: Skin is warm and dry.  Neurological:     Mental Status: She is alert. Mental status is at baseline.  Psychiatric:        Mood and Affect: Mood normal.     Labs reviewed: Recent Labs    02/25/24 0000 04/07/24 0000 10/06/24 0000 11/09/24 1725  NA 135* 135* 137 132*  K 4.2 3.9 4.3 4.4  CL 98* 99 99 92*  CO2 30* 28* 10* 27  GLUCOSE  --   --   --  96  BUN 16 23* 21 16  CREATININE 1.3* 1.4* 1.6* 1.37*  CALCIUM 9.5 9.5  --  9.7   Recent Labs    04/07/24 0000 10/06/24 0000  AST 16 18  ALT 11 18  ALKPHOS 60 56  ALBUMIN 3.7 3.8   Recent Labs    04/07/24 0000 06/02/24 0000 10/06/24 0000 11/09/24 1725  WBC 8.4 7.4 5.5 6.4  NEUTROABS 5,620.00 4,507.00 2,926.00  --   HGB 11.9* 11.5* 12.3 13.1  HCT 35* 34* 37 38.5  MCV  --   --   --  97.5  PLT 277 248 297 294   Lab  Results  Component Value Date   TSH 4.45 04/07/2024   Lab Results  Component Value Date   HGBA1C 5.6 10/06/2024   Lab Results  Component Value Date   CHOL 180 04/07/2024   HDL 48 04/07/2024   LDLCALC 102 04/07/2024   LDLDIRECT 132.0 07/12/2017   TRIG 180 (A) 04/07/2024   CHOLHDL 4.7 08/17/2021    Significant Diagnostic Results in last 30 days:  No results found.  Assessment/Plan Vitamin D  deficiency Continues on vit D supplement daily   Slow transit constipation Controlled on current regimen.   Moderate dementia without behavioral disturbance, psychotic disturbance, mood disturbance, or anxiety, unspecified dementia type (HCC) Stable, no acute changes in cognitive or functional status, continue supportive care by staff and family.   CKD (chronic kidney disease), stage IV (HCC) Chronic and stable Encourage proper hydration Follow metabolic panel Avoid nephrotoxic meds (NSAIDS)  Acquired hypothyroidism Stable, continue Synthroid  50 mcg daily.  Last TSH in May 2025 4.45.  Dysuria -without frequency or urgency Will get UA C&S, could be related to vaginal atrophy.  Will rule out UTI  Tylyn Stankovich K. Caro BODILY [facility] [phone]       [1]  Allergies Allergen Reactions   Bactrim  [Sulfamethoxazole -Trimethoprim ] Nausea And Vomiting    Lethargic and weakness   "

## 2024-12-23 NOTE — Assessment & Plan Note (Signed)
 Continues on vit D supplement daily

## 2024-12-23 NOTE — Assessment & Plan Note (Signed)
 Stable, no acute changes in cognitive or functional status, continue supportive care by staff and family.

## 2024-12-23 NOTE — Assessment & Plan Note (Signed)
 Chronic and stable Encourage proper hydration Follow metabolic panel Avoid nephrotoxic meds (NSAIDS)

## 2024-12-23 NOTE — Assessment & Plan Note (Signed)
 Controlled on current regimen

## 2024-12-23 NOTE — Assessment & Plan Note (Signed)
 Stable, continue Synthroid  50 mcg daily.  Last TSH in May 2025 4.45.

## 2024-12-27 ENCOUNTER — Other Ambulatory Visit: Payer: Self-pay | Admitting: Orthopedic Surgery
